# Patient Record
Sex: Male | Born: 1955 | Race: White | Hispanic: No | Marital: Married | Smoking: Never smoker
Health system: Southern US, Community
[De-identification: ages and names within clinical notes are randomized; demographics above are authoritative.]

## PROBLEM LIST (undated history)

## (undated) DIAGNOSIS — E119 Type 2 diabetes mellitus without complications: Secondary | ICD-10-CM

## (undated) DIAGNOSIS — M069 Rheumatoid arthritis, unspecified: Secondary | ICD-10-CM

## (undated) DIAGNOSIS — D649 Anemia, unspecified: Secondary | ICD-10-CM

## (undated) DIAGNOSIS — R011 Cardiac murmur, unspecified: Secondary | ICD-10-CM

## (undated) DIAGNOSIS — M1990 Unspecified osteoarthritis, unspecified site: Secondary | ICD-10-CM

## (undated) DIAGNOSIS — I7300 Raynaud's syndrome without gangrene: Secondary | ICD-10-CM

## (undated) DIAGNOSIS — Z5189 Encounter for other specified aftercare: Secondary | ICD-10-CM

## (undated) DIAGNOSIS — IMO0002 Reserved for concepts with insufficient information to code with codable children: Secondary | ICD-10-CM

## (undated) DIAGNOSIS — F329 Major depressive disorder, single episode, unspecified: Secondary | ICD-10-CM

## (undated) DIAGNOSIS — F32A Depression, unspecified: Secondary | ICD-10-CM

## (undated) DIAGNOSIS — N200 Calculus of kidney: Secondary | ICD-10-CM

## (undated) DIAGNOSIS — K219 Gastro-esophageal reflux disease without esophagitis: Secondary | ICD-10-CM

## (undated) DIAGNOSIS — I10 Essential (primary) hypertension: Secondary | ICD-10-CM

## (undated) DIAGNOSIS — G43709 Chronic migraine without aura, not intractable, without status migrainosus: Secondary | ICD-10-CM

## (undated) DIAGNOSIS — J189 Pneumonia, unspecified organism: Secondary | ICD-10-CM

## (undated) DIAGNOSIS — Z87442 Personal history of urinary calculi: Secondary | ICD-10-CM

## (undated) DIAGNOSIS — T7840XA Allergy, unspecified, initial encounter: Secondary | ICD-10-CM

## (undated) DIAGNOSIS — M549 Dorsalgia, unspecified: Secondary | ICD-10-CM

## (undated) DIAGNOSIS — F419 Anxiety disorder, unspecified: Secondary | ICD-10-CM

## (undated) HISTORY — DX: Allergy, unspecified, initial encounter: T78.40XA

## (undated) HISTORY — DX: Dorsalgia, unspecified: M54.9

## (undated) HISTORY — DX: Calculus of kidney: N20.0

## (undated) HISTORY — DX: Depression, unspecified: F32.A

## (undated) HISTORY — DX: Raynaud's syndrome without gangrene: I73.00

## (undated) HISTORY — PX: COLON SURGERY: SHX602

## (undated) HISTORY — DX: Cardiac murmur, unspecified: R01.1

## (undated) HISTORY — DX: Anxiety disorder, unspecified: F41.9

## (undated) HISTORY — PX: SPINE SURGERY: SHX786

## (undated) HISTORY — DX: Type 2 diabetes mellitus without complications: E11.9

## (undated) HISTORY — DX: Encounter for other specified aftercare: Z51.89

## (undated) HISTORY — DX: Reserved for concepts with insufficient information to code with codable children: IMO0002

## (undated) HISTORY — PX: LYMPH NODE DISSECTION: SHX5087

## (undated) HISTORY — DX: Unspecified osteoarthritis, unspecified site: M19.90

## (undated) HISTORY — DX: Major depressive disorder, single episode, unspecified: F32.9

## (undated) HISTORY — PX: VASECTOMY: SHX75

## (undated) HISTORY — DX: Essential (primary) hypertension: I10

## (undated) HISTORY — DX: Chronic migraine without aura, not intractable, without status migrainosus: G43.709

---

## 1998-06-05 HISTORY — PX: COLON SURGERY: SHX602

## 2000-03-05 ENCOUNTER — Encounter: Admission: RE | Admit: 2000-03-05 | Discharge: 2000-03-05 | Payer: Self-pay | Admitting: Rheumatology

## 2000-03-05 ENCOUNTER — Encounter: Payer: Self-pay | Admitting: Rheumatology

## 2000-06-13 ENCOUNTER — Encounter: Payer: Self-pay | Admitting: Neurology

## 2000-06-13 ENCOUNTER — Ambulatory Visit (HOSPITAL_COMMUNITY): Admission: RE | Admit: 2000-06-13 | Discharge: 2000-06-13 | Payer: Self-pay | Admitting: Neurology

## 2001-06-04 ENCOUNTER — Encounter (INDEPENDENT_AMBULATORY_CARE_PROVIDER_SITE_OTHER): Payer: Self-pay | Admitting: *Deleted

## 2001-06-04 ENCOUNTER — Ambulatory Visit (HOSPITAL_COMMUNITY): Admission: RE | Admit: 2001-06-04 | Discharge: 2001-06-04 | Payer: Self-pay | Admitting: *Deleted

## 2001-07-08 ENCOUNTER — Observation Stay (HOSPITAL_COMMUNITY): Admission: RE | Admit: 2001-07-08 | Discharge: 2001-07-09 | Payer: Self-pay | Admitting: General Surgery

## 2001-07-08 ENCOUNTER — Encounter (INDEPENDENT_AMBULATORY_CARE_PROVIDER_SITE_OTHER): Payer: Self-pay | Admitting: Specialist

## 2003-03-19 ENCOUNTER — Encounter: Admission: RE | Admit: 2003-03-19 | Discharge: 2003-03-19 | Payer: Self-pay | Admitting: Internal Medicine

## 2003-03-19 ENCOUNTER — Encounter: Payer: Self-pay | Admitting: Internal Medicine

## 2008-07-02 ENCOUNTER — Emergency Department (HOSPITAL_COMMUNITY): Admission: EM | Admit: 2008-07-02 | Discharge: 2008-07-02 | Payer: Self-pay | Admitting: Emergency Medicine

## 2009-09-21 LAB — HM COLONOSCOPY: HM Colonoscopy: NORMAL

## 2010-06-15 ENCOUNTER — Ambulatory Visit (HOSPITAL_COMMUNITY)
Admission: RE | Admit: 2010-06-15 | Discharge: 2010-06-15 | Payer: Self-pay | Source: Home / Self Care | Attending: Family Medicine | Admitting: Family Medicine

## 2010-06-28 ENCOUNTER — Encounter
Admission: RE | Admit: 2010-06-28 | Discharge: 2010-06-28 | Payer: Self-pay | Source: Home / Self Care | Attending: Family Medicine | Admitting: Family Medicine

## 2010-09-19 LAB — POCT I-STAT, CHEM 8
BUN: 11 mg/dL (ref 6–23)
Calcium, Ion: 1.15 mmol/L (ref 1.12–1.32)
Chloride: 103 mEq/L (ref 96–112)
Creatinine, Ser: 0.9 mg/dL (ref 0.4–1.5)
Glucose, Bld: 94 mg/dL (ref 70–99)
HCT: 46 % (ref 39.0–52.0)
Hemoglobin: 15.6 g/dL (ref 13.0–17.0)
Potassium: 4.9 mEq/L (ref 3.5–5.1)
Sodium: 139 mEq/L (ref 135–145)
TCO2: 30 mmol/L (ref 0–100)

## 2010-10-10 ENCOUNTER — Other Ambulatory Visit: Payer: Self-pay | Admitting: Family Medicine

## 2010-10-21 NOTE — Procedures (Signed)
[place]. [facility]  Patient:    John Mcclain, John Mcclain Visit Number: [id number] MRN: [id number]          Service Type: END Location: ENDO Attending Physician:  Sabino Gasser Dictated by:   Sabino Gasser, M.D. Admit Date:  06/04/2001   CC:         Lacretia Leigh. Quintella Reichert, M.D.                           Procedure Report  PROCEDURE:  Upper endoscopy.  INDICATION:  Hemoccult positivity, history of GERD.  ANESTHESIA:  Demerol 50 mg, Versed 6 mg.  DESCRIPTION OF PROCEDURE:  With the patient mildly sedated in the left lateral decubitus position, the Olympus videoscopic endoscope was inserted in the mouth, advanced under direct vision to the esophagus.  There was no evidence of Barretts esophagus noted.  This was photographed.  We entered into the stomach, and blood flecks could be seen in streaks in the stomach.  This was photographed, but the mucosa itself, fundus, body, antrum all normal.  There were some areas of mild erythema in the prepyloric area, which were photographed and biopsied.  We entered into the duodenal bulb, second portion of the duodenum both appeared normal.  From this point, the endoscope was withdrawn, taking circumferential views of the duodenal mucosa until the endoscope had been pulled back into the stomach and placed in retroflexion to view the stomach from below.  The endoscope was then straightened and withdrawn, taking circumferential views of the remaining gastric and esophageal mucosa.  The patients vital signs and pulse oximetry remained stable.  The patient tolerated the procedure well without apparent complications.  FINDINGS:  Blood streaking in the stomach, which may, in fact, be the cause of the patients Hemoccult positivity.  Erythema of prepyloric area, await biopsy report, but will still proceed to colonoscopy as planned. Dictated by:   Sabino Gasser, M.D. Attending Physician:  Sabino Gasser DD:  06/04/01 TD:  06/04/01 Job:  [id number]

## 2010-10-21 NOTE — Op Note (Signed)
[facility]  Patient:    John Mcclain, John Mcclain Visit Number: [id number] MRN: [id number]          Service Type: SUR Location: 3W [id number] [id number] Attending Physician:  Brandy Hale Dictated by:   Angelia Mould. Derrell Lolling, M.D. Proc. Date: 07/08/01 Admit Date:  07/08/2001   CC:         Sabino Gasser, M.D.  Lacretia Leigh. Quintella Reichert, M.D.   Operative Report  PREOPERATIVE DIAGNOSIS:  Rectal polyp, recurrent.  POSTOPERATIVE DIAGNOSIS:  Rectal polyp, recurrent.  OPERATION PERFORMED:  Rigid proctoscopy, transanal excision of recurrent rectal polyp.  SURGEON:  Angelia Mould. Derrell Lolling, M.D.  FIRST ASSISTANT:  Sharlet Salina T. Hoxworth, M.D.  OPERATIVE INDICATIONS:  This is a 55 year old white man who underwent transanal excision of a rectal polyp at 10 cm. This was thought to possibly be a benign hamartoma or possibly a "retention type polyp". He had presented with rectal bleeding and that resolved following transanal excision. He has not followed up very much. He recently went for an elective colonoscopy and upper endoscopy because of some Hemoccult positive stools. His upper endoscopy showed a little bit of mild gastritis. His colonoscopy showed a lobulated mass on the anterior wall of the rectum at about 10 cm. This was biopsied and it was read as showing hyperplastic colorectal mucosa, suggestive of rectal prolapse syndrome. No malignancy identified. I have looked at this polyp myself and he has about a 1.5 to 2.0 cm bumpy slightly exophytic mass on the anterior wall of the rectum at 10 cm. He was brought to the operating room for transanal excision under general anesthesia.  OPERATIVE TECHNIQUE:  Following the induction of general endotracheal anesthesia, the patient was placed in a prone jack knife position. Rigid proctoscopy was carried out and confirmed the anatomic findings described above.  The perianal area was prepped and draped in a sterile fashion. The  rectal sphincters were gently dilated over the space of about five minutes until we could insert the larger rectal instruments. We ultimately inserted a lighted bivalve retractor and a small Deaver retractor and we were able to identify the polyp. We placed a suture of 2-0 Vicryl distal to the polyp for traction and the polyp came down to where we could see it better. We used electrocautery to completely excise the polyp with about a 5 mm margin in all directions. We took full thickness into the deep muscle layers all the way around. We did this dissection stepwise from distal to proximal placing interrupted sutures of 2-0 Vicryl to close the mucosa as we went. Ultimately we removed the polyp and the surrounding normal mucosa completely and sent it for histologic evaluation. We completed the closure with interrupted sutures of 2-0 Vicryl. Approximately 10 such sutures of Vicryl had to be placed but this provided complete closure and excellent hemostasis. We injected the rectal mucosa and submucosa with 0.5% Marcaine with epinephrine. We observed this for about 5 minutes and found no bleeding. There was no defect in the mucosa. The instruments were removed. External gauze was placed. The patient tolerated the procedure well and was taken to the recovery room in stable condition. Estimated blood loss was about 30-40 cc. Complications were none. Sponge, needle and instrument counts were correct. Dictated by:   Angelia Mould. Derrell Lolling, M.D. Attending Physician:  Brandy Hale DD:  07/08/01 TD:  07/08/01 Job: [id number]

## 2010-10-21 NOTE — Procedures (Signed)
[place]  Patient:    John, Mcclain Visit Number: [id number] MRN: [id number]          Service Type: END Location: ENDO Attending Physician:  Sabino Gasser Dictated by:   Sabino Gasser, M.D. Proc. Date: 06/04/01 Admit Date:  06/04/2001   CC:         Feliciana Rossetti, M.D.   Procedure Report  PROCEDURE PERFORMED:  Colonoscopy.  ENDOSCOPIST:  Sabino Gasser, M.D.  INDICATIONS FOR PROCEDURE:  Hemoccult positivity.  ANESTHESIA:  Demerol 50 mg, Versed 4 mg.  DESCRIPTION OF PROCEDURE:  With the patient mildly sedated in the left lateral decubitus position, the Olympus videoscopic colonoscope was inserted in the rectum and passed under direct vision into the cecum identified by the ileocecal valve and appendiceal orifice, both of which were photographed. From this point, the colonoscope was slowly withdrawn, taking circumferential views of the entire colonic mucosa stopping in the rectum at approximately 10 cm from the anal verge at which point a polypoid mass was seen and photographed.  Whether this was benign or malignant is questionable at this point.  It was fairly large, a few centimeters and multilobulated and therefore biopsies only were taken.  The endoscope was then placed on retroflexion to view the anal canal from above and this showed some hemorrhoids but otherwise unremarkable and was photographed.  The endoscope was straightened and withdrawn.  Patients vital signs and pulse oximeter remained stable.  The patient tolerated the procedure well and without apparent complications.  FINDINGS:  Lobulated mass/polyp in the rectal area.  PLAN:  Will need to await biopsy report to determine future course of action whether further polypectomy or surgery will be necessary but certainly this will need to be removed in one fashion or another.  Will await biopsy report. Will ask the patient to call me in a few days for results and follow up with me  as an outpatient. Dictated by:   Sabino Gasser, M.D. Attending Physician:  Sabino Gasser DD:  06/04/01 TD:  06/04/01 Job: [id number]

## 2011-06-12 ENCOUNTER — Ambulatory Visit (INDEPENDENT_AMBULATORY_CARE_PROVIDER_SITE_OTHER): Payer: PRIVATE HEALTH INSURANCE | Admitting: Family Medicine

## 2011-06-12 DIAGNOSIS — R7301 Impaired fasting glucose: Secondary | ICD-10-CM

## 2011-06-12 DIAGNOSIS — F411 Generalized anxiety disorder: Secondary | ICD-10-CM

## 2011-07-25 ENCOUNTER — Other Ambulatory Visit: Payer: Self-pay | Admitting: *Deleted

## 2011-07-25 MED ORDER — METFORMIN HCL ER 500 MG PO TB24: 500.0000 mg | ORAL_TABLET | ORAL | Status: DC

## 2011-09-18 ENCOUNTER — Ambulatory Visit: Payer: PRIVATE HEALTH INSURANCE | Admitting: Family Medicine

## 2011-09-25 ENCOUNTER — Ambulatory Visit (INDEPENDENT_AMBULATORY_CARE_PROVIDER_SITE_OTHER): Payer: PRIVATE HEALTH INSURANCE | Admitting: Family Medicine

## 2011-09-25 VITALS — BP 110/71 | HR 82 | Temp 97.0°F | Resp 16 | Ht 65.0 in | Wt 146.0 lb

## 2011-09-25 DIAGNOSIS — F419 Anxiety disorder, unspecified: Secondary | ICD-10-CM

## 2011-09-25 DIAGNOSIS — I10 Essential (primary) hypertension: Secondary | ICD-10-CM

## 2011-09-25 DIAGNOSIS — M069 Rheumatoid arthritis, unspecified: Secondary | ICD-10-CM

## 2011-09-25 DIAGNOSIS — K219 Gastro-esophageal reflux disease without esophagitis: Secondary | ICD-10-CM

## 2011-09-25 DIAGNOSIS — N200 Calculus of kidney: Secondary | ICD-10-CM

## 2011-09-25 DIAGNOSIS — E119 Type 2 diabetes mellitus without complications: Secondary | ICD-10-CM

## 2011-09-25 DIAGNOSIS — E291 Testicular hypofunction: Secondary | ICD-10-CM

## 2011-09-25 MED ORDER — METFORMIN HCL ER 500 MG PO TB24: ORAL_TABLET | ORAL | Status: DC

## 2011-09-25 MED ORDER — LISINOPRIL 10 MG PO TABS: 10.0000 mg | ORAL_TABLET | Freq: Every day | ORAL | Status: DC

## 2011-09-25 NOTE — Progress Notes (Signed)
  Subjective:    Patient ID: John Mcclain, male    DOB: [birth year], 56 y.o.   MRN: [id number]  HPI Patient presents for routine follow up  Migraine headaches- Receiving Botox injections from Dr. Neale Burly  RA- Stable; care per Dr. Kellie Simmering.  Methotrexate, sulfasalazine and plaquenil.  Prn prednisone  DM- patient provides BS readings.  Spikes when on prednisone.   Review of Systems     Objective:   Physical Exam  Constitutional: He appears well-developed.  HENT:  Head: Normocephalic.  Right Ear: External ear normal.  Left Ear: External ear normal.  Nose: Nose normal.  Mouth/Throat: Oropharynx is clear and moist.  Neck: Neck supple. No thyromegaly present.  Cardiovascular: Normal rate, regular rhythm and normal heart sounds.   Pulmonary/Chest: Effort normal and breath sounds normal.  Abdominal: Soft. Bowel sounds are normal.  Neurological: He is alert.  Skin: Skin is warm.  Psychiatric: He has a normal mood and affect.          Assessment & Plan:   1. DM (diabetes mellitus)  metFORMIN (GLUCOPHAGE XR) 500 MG 24 hr tablet, POCT glycosylated hemoglobin (Hb A1C), Testosterone  2. RA (rheumatoid arthritis)  POCT glycosylated hemoglobin (Hb A1C), Testosterone  3. Migraine    4. HTN (hypertension)  lisinopril (PRINIVIL,ZESTRIL) 10 MG tablet  5. Anxiety    6. Hypogonadism male  POCT glycosylated hemoglobin (Hb A1C), Testosterone  7. Nephrolithiasis  POCT glycosylated hemoglobin (Hb A1C), Testosterone  8. GERD (gastroesophageal reflux disease)

## 2011-10-12 ENCOUNTER — Ambulatory Visit: Payer: PRIVATE HEALTH INSURANCE

## 2011-10-12 ENCOUNTER — Ambulatory Visit (INDEPENDENT_AMBULATORY_CARE_PROVIDER_SITE_OTHER): Payer: PRIVATE HEALTH INSURANCE | Admitting: Family Medicine

## 2011-10-12 DIAGNOSIS — R109 Unspecified abdominal pain: Secondary | ICD-10-CM

## 2011-10-12 LAB — POCT URINALYSIS DIPSTICK
Bilirubin, UA: NEGATIVE
Glucose, UA: NEGATIVE
Ketones, UA: NEGATIVE
Leukocytes, UA: NEGATIVE
Nitrite, UA: NEGATIVE
Protein, UA: NEGATIVE
Spec Grav, UA: 1.015
Urobilinogen, UA: 0.2
pH, UA: 6

## 2011-10-12 LAB — POCT UA - MICROSCOPIC ONLY
Bacteria, U Microscopic: NEGATIVE
Casts, Ur, LPF, POC: NEGATIVE
Crystals, Ur, HPF, POC: NEGATIVE
Epithelial cells, urine per micros: NEGATIVE
Mucus, UA: NEGATIVE
Yeast, UA: NEGATIVE

## 2011-10-12 MED ORDER — TAMSULOSIN HCL 0.4 MG PO CAPS: 0.4000 mg | ORAL_CAPSULE | Freq: Every day | ORAL | Status: DC

## 2011-10-12 MED ORDER — TRAMADOL HCL 50 MG PO TABS: 50.0000 mg | ORAL_TABLET | Freq: Three times a day (TID) | ORAL | Status: DC | PRN

## 2011-10-12 MED ORDER — TRAMADOL HCL 50 MG PO TABS: 50.0000 mg | ORAL_TABLET | Freq: Three times a day (TID) | ORAL | Status: AC | PRN

## 2011-10-12 MED ORDER — POLYETHYLENE GLYCOL 3350 17 GM/SCOOP PO POWD: ORAL | Status: DC

## 2011-10-12 NOTE — Progress Notes (Signed)
  Subjective:    Patient ID: John Mcclain, male    DOB: [birth year], 56 y.o.   MRN: [id number]  HPI Patient presents with 2 day history of (R) flank pain that has moved to (R) lower abdomen. Feels bloated Slight dysuria Took Flomax and hydrocodone over night  Last BM yesterday; soft stool   Review of Systems  Constitutional: Negative for chills. Fever: tactile fever.   PMH/ RA; immunosuppressed on methotrexate           H/O nephrolithiasis Objective:   Physical Exam  Constitutional: He appears well-developed.  HENT:  Mouth/Throat: Oropharynx is clear and moist.  Neck: Neck supple.  Cardiovascular: Normal rate, regular rhythm and normal heart sounds.   Pulmonary/Chest: Effort normal and breath sounds normal.  Abdominal: Soft. He exhibits no mass. There is no tenderness. There is no guarding.       doughy  Neurological: He is alert.  Skin: Skin is warm.  Psychiatric: He has a normal mood and affect.     Results for orders placed in visit on 10/12/11  POCT UA - MICROSCOPIC ONLY      Component Value Range   WBC, Ur, HPF, POC 0-1     RBC, urine, microscopic 0-1     Bacteria, U Microscopic neg     Mucus, UA neg     Epithelial cells, urine per micros neg     Crystals, Ur, HPF, POC neg     Casts, Ur, LPF, POC neg     Yeast, UA neg    POCT URINALYSIS DIPSTICK      Component Value Range   Color, UA yellow     Clarity, UA clear     Glucose, UA neg     Bilirubin, UA neg     Ketones, UA neg     Spec Grav, UA 1.015     Blood, UA trace     pH, UA 6.0     Protein, UA neg     Urobilinogen, UA 0.2     Nitrite, UA neg     Leukocytes, UA Negative      [facility] reading (PRIMARY) by  Dr. Hal Hope Increase stool gas pattern    Assessment & Plan:   1. Abdominal pain; increase stool pattern on x ray; normal U/A POCT UA - Microscopic Only, POCT urinalysis dipstick, DG Abd 1 View, polyethylene glycol powder (GLYCOLAX/MIRALAX) powder, traMADol (ULTRAM) 50 MG tablet, Tamsulosin HCl  (FLOMAX) 0.4 MG CAPS, close follow up

## 2011-10-16 ENCOUNTER — Other Ambulatory Visit: Payer: Self-pay | Admitting: Family Medicine

## 2011-10-16 DIAGNOSIS — E291 Testicular hypofunction: Secondary | ICD-10-CM

## 2011-10-16 DIAGNOSIS — E119 Type 2 diabetes mellitus without complications: Secondary | ICD-10-CM

## 2011-10-17 LAB — TESTOSTERONE, FREE, TOTAL, SHBG
Sex Hormone Binding: 49 nmol/L (ref 13–71)
Testosterone, Free: 37.2 pg/mL — ABNORMAL LOW (ref 47.0–244.0)
Testosterone-% Free: 1.5 % — ABNORMAL LOW (ref 1.6–2.9)
Testosterone: 249.04 ng/dL — ABNORMAL LOW (ref 300–890)

## 2011-10-17 LAB — HEMOGLOBIN A1C
Hgb A1c MFr Bld: 4.5 % (ref <5.7)
Mean Plasma Glucose: 82 mg/dL (ref <117)

## 2011-10-28 ENCOUNTER — Other Ambulatory Visit: Payer: Self-pay | Admitting: Family Medicine

## 2011-10-28 ENCOUNTER — Telehealth: Payer: Self-pay | Admitting: Family Medicine

## 2011-10-28 DIAGNOSIS — E291 Testicular hypofunction: Secondary | ICD-10-CM

## 2011-10-28 MED ORDER — TESTOSTERONE 20.25 MG/ACT (1.62%) TD GEL: 2.0000 | Freq: Every day | TRANSDERMAL | Status: DC

## 2011-11-07 ENCOUNTER — Encounter: Payer: Self-pay | Admitting: Family Medicine

## 2011-11-11 NOTE — Telephone Encounter (Signed)
LM to review BW with patient 10/28/11.

## 2011-11-17 ENCOUNTER — Other Ambulatory Visit: Payer: Self-pay | Admitting: Rheumatology

## 2011-11-17 DIAGNOSIS — M542 Cervicalgia: Secondary | ICD-10-CM

## 2011-11-21 ENCOUNTER — Ambulatory Visit
Admission: RE | Admit: 2011-11-21 | Discharge: 2011-11-21 | Disposition: A | Discharge status: Home / Self Care | Payer: PRIVATE HEALTH INSURANCE | Source: Ambulatory Visit | Attending: Rheumatology | Admitting: Rheumatology

## 2011-11-21 DIAGNOSIS — M542 Cervicalgia: Secondary | ICD-10-CM

## 2011-12-26 ENCOUNTER — Ambulatory Visit (INDEPENDENT_AMBULATORY_CARE_PROVIDER_SITE_OTHER): Payer: PRIVATE HEALTH INSURANCE | Admitting: Family Medicine

## 2011-12-26 ENCOUNTER — Encounter: Payer: Self-pay | Admitting: Family Medicine

## 2011-12-26 VITALS — BP 117/72 | HR 103 | Temp 98.1°F | Resp 16 | Ht 65.0 in | Wt 143.0 lb

## 2011-12-26 DIAGNOSIS — M25519 Pain in unspecified shoulder: Secondary | ICD-10-CM

## 2011-12-26 DIAGNOSIS — G43909 Migraine, unspecified, not intractable, without status migrainosus: Secondary | ICD-10-CM | POA: Insufficient documentation

## 2011-12-26 DIAGNOSIS — E559 Vitamin D deficiency, unspecified: Secondary | ICD-10-CM | POA: Insufficient documentation

## 2011-12-26 DIAGNOSIS — E119 Type 2 diabetes mellitus without complications: Secondary | ICD-10-CM

## 2011-12-26 DIAGNOSIS — F419 Anxiety disorder, unspecified: Secondary | ICD-10-CM | POA: Insufficient documentation

## 2011-12-26 DIAGNOSIS — M25512 Pain in left shoulder: Secondary | ICD-10-CM

## 2011-12-26 DIAGNOSIS — E291 Testicular hypofunction: Secondary | ICD-10-CM | POA: Insufficient documentation

## 2011-12-26 DIAGNOSIS — E1159 Type 2 diabetes mellitus with other circulatory complications: Secondary | ICD-10-CM | POA: Insufficient documentation

## 2011-12-26 DIAGNOSIS — I10 Essential (primary) hypertension: Secondary | ICD-10-CM | POA: Insufficient documentation

## 2011-12-26 DIAGNOSIS — M069 Rheumatoid arthritis, unspecified: Secondary | ICD-10-CM | POA: Insufficient documentation

## 2011-12-26 DIAGNOSIS — N200 Calculus of kidney: Secondary | ICD-10-CM | POA: Insufficient documentation

## 2011-12-26 DIAGNOSIS — K2980 Duodenitis without bleeding: Secondary | ICD-10-CM | POA: Insufficient documentation

## 2011-12-26 LAB — POCT GLYCOSYLATED HEMOGLOBIN (HGB A1C): Hemoglobin A1C: 4.3

## 2011-12-26 MED ORDER — LORAZEPAM 1 MG PO TABS: 1.0000 mg | ORAL_TABLET | Freq: Three times a day (TID) | ORAL | Status: DC

## 2011-12-26 NOTE — Patient Instructions (Addendum)
Diabetes Meal Planning Guide The diabetes meal planning guide is a tool to help you plan your meals and snacks. It is important for people with diabetes to manage their blood glucose (sugar) levels. Choosing the right foods and the right amounts throughout your day will help control your blood glucose. Eating right can even help you improve your blood pressure and reach or maintain a healthy weight. CARBOHYDRATE COUNTING MADE EASY When you eat carbohydrates, they turn to sugar. This raises your blood glucose level. Counting carbohydrates can help you control this level so you feel better. When you plan your meals by counting carbohydrates, you can have more flexibility in what you eat and balance your medicine with your food intake. Carbohydrate counting simply means adding up the total amount of carbohydrate grams in your meals and snacks. Try to eat about the same amount at each meal. Foods with carbohydrates are listed below. Each portion below is 1 carbohydrate serving or 15 grams of carbohydrates. Ask your dietician how many grams of carbohydrates you should eat at each meal or snack. Grains and Starches  1 slice bread.    English muffin or hotdog/hamburger bun.    cup cold cereal (unsweetened).   ? cup cooked pasta or rice.    cup starchy vegetables (corn, potatoes, peas, beans, winter squash).   1 tortilla (6 inches).    bagel.   1 waffle or pancake (size of a CD).    cup cooked cereal.   4 to 6 small crackers.  *Whole grain is recommended. Fruit  1 cup fresh unsweetened berries, melon, papaya, pineapple.   1 small fresh fruit.    banana or mango.    cup fruit juice (4 oz unsweetened).    cup canned fruit in natural juice or water.   2 tbs dried fruit.   12 to 15 grapes or cherries.  Milk and Yogurt  1 cup fat-free or 1% milk.   1 cup soy milk.   6 oz light yogurt with sugar-free sweetener.   6 oz low-fat soy yogurt.   6 oz plain yogurt.   Vegetables  1 cup raw or  cup cooked is counted as 0 carbohydrates or a "free" food.   If you eat 3 or more servings at 1 meal, count them as 1 carbohydrate serving.  Other Carbohydrates   oz chips or pretzels.    cup ice cream or frozen yogurt.    cup sherbet or sorbet.   2 inch square cake, no frosting.   1 tbs honey, sugar, jam, jelly, or syrup.   2 small cookies.   3 squares of graham crackers.   3 cups popcorn.   6 crackers.   1 cup broth-based soup.   Count 1 cup casserole or other mixed foods as 2 carbohydrate servings.   Foods with less than 20 calories in a serving may be counted as 0 carbohydrates or a "free" food.  You may want to purchase a book or computer software that lists the carbohydrate gram counts of different foods. In addition, the nutrition facts panel on the labels of the foods you eat are a good source of this information. The label will tell you how big the serving size is and the total number of carbohydrate grams you will be eating per serving. Divide this number by 15 to obtain the number of carbohydrate servings in a portion. Remember, 1 carbohydrate serving equals 15 grams of carbohydrate. SERVING SIZES Measuring foods and serving sizes helps you   make sure you are getting the right amount of food. The list below tells how big or small some common serving sizes are.  1 oz.........4 stacked dice.   3 oz........Marland KitchenDeck of cards.   1 tsp.......Marland KitchenTip of little finger.   1 tbs......Marland KitchenMarland KitchenThumb.   2 tbs.......Marland KitchenGolf ball.    cup......Marland KitchenHalf of a fist.   1 cup.......Marland KitchenA fist.  SAMPLE DIABETES MEAL PLAN Below is a sample meal plan that includes foods from the grain and starches, dairy, vegetable, fruit, and meat groups. A dietician can individualize a meal plan to fit your calorie needs and tell you the number of servings needed from each food group. However, controlling the total amount of carbohydrates in your meal or snack is more important than  making sure you include all of the food groups at every meal. You may interchange carbohydrate containing foods (dairy, starches, and fruits). The meal plan below is an example of a 2000 calorie diet using carbohydrate counting. This meal plan has 17 carbohydrate servings. Breakfast  1 cup oatmeal (2 carb servings).    cup light yogurt (1 carb serving).   1 cup blueberries (1 carb serving).    cup almonds.  Snack  1 large apple (2 carb servings).   1 low-fat string cheese stick.  Lunch  Chicken breast salad.   1 cup spinach.    cup chopped tomatoes.   2 oz chicken breast, sliced.   2 tbs low-fat [place] dressing.   12 whole-wheat crackers (2 carb servings).   12 to 15 grapes (1 carb serving).   1 cup low-fat milk (1 carb serving).  Snack  1 cup carrots.    cup hummus (1 carb serving).  Dinner  3 oz broiled salmon.   1 cup brown rice (3 carb servings).  Snack  1  cups steamed broccoli (1 carb serving) drizzled with 1 tsp olive oil and lemon juice.   1 cup light pudding (2 carb servings).  DIABETES MEAL PLANNING WORKSHEET Your dietician can use this worksheet to help you decide how many servings of foods and what types of foods are right for you.  BREAKFAST Food Group and Servings / Carb Servings Grain/Starches __________________________________ Dairy __________________________________________ Vegetable ______________________________________ Fruit ___________________________________________ Meat __________________________________________ Fat ____________________________________________ LUNCH Food Group and Servings / Carb Servings Grain/Starches ___________________________________ Dairy ___________________________________________ Fruit ____________________________________________ Meat ___________________________________________ Fat _____________________________________________ Laural Golden Food Group and Servings / Carb Servings Grain/Starches  ___________________________________ Dairy ___________________________________________ Fruit ____________________________________________ Meat ___________________________________________ Fat _____________________________________________ SNACKS Food Group and Servings / Carb Servings Grain/Starches ___________________________________ Dairy ___________________________________________ Vegetable _______________________________________ Fruit ____________________________________________ Meat ___________________________________________ Fat _____________________________________________ DAILY TOTALS Starches _________________________ Vegetable ________________________ Fruit ____________________________ Dairy ____________________________ Meat ____________________________ Fat ______________________________ Document Released: 02/16/2005 Document Revised: 05/11/2011 Document Reviewed: 12/28/2008 [employer] Patient Information 2012 [place], [place].  RE: Metformin- reduce your dose to 1 tablet daily with largest meal. You may be able to discontinue this medication completely if you follow the Diabetic Meal Plan outlined above.     RE: Left shoulder region pain and winged scapula- you may need to have an MRI of Left shoulder region done to clarify what is the source of your problem     Meralgia Paresthetica  Meralgia paresthetica (MP) is a disorder characterized by tingling, numbness, and burning pain in the outer side of the thigh. It occurs in men more than women. MP is generally found in middle-aged or overweight people. Sometimes, the disorder may disappear. CAUSES The disorder is caused by a nerve in the thigh being squeezed (compressed). MP may be associated  with tight clothing, pregnancy, diabetes, and being overweight (obese). SYMPTOMS  Tingling, numbness, and burning in the outer thigh.   An area of the skin may be painful and sensitive to the touch.  The symptoms often worsen after  walking or standing. TREATMENT  Treatment is based on your symptoms and is mainly supportive. Treatment may include:  Wearing looser clothing.   Losing weight.   Avoiding prolonged standing or walking.   Taking medication.   Surgery if the pain is peristent or severe.  MP usually eases or disappears after treatment. Surgery is not always fully successful. Document Released: 05/12/2002 Document Revised: 05/11/2011 Document Reviewed: 05/22/2005 [facility] Patient Information 2012 [place], [place].

## 2011-12-26 NOTE — Progress Notes (Signed)
Subjective:    Patient ID: John Mcclain, male    DOB: 09-24-1955, 56 y.o.   MRN: [id number]  HPI   This 56 y.o. Cauc male has Glucose intolerance treated with Metformin 500 mg bid; last A1c= 4.5%  FSBS have been in good range but he recently finished Prednisone prescribed by Dr. Kellie Simmering for pain  associated with C-spine DDD. He does not follow a meal plan and admits drinking sodas (has cut back).  Log of FSBS: fasting= 90-163.   He c/o numbness involving the distal lateral thigh of right leg. No known recent trauma. No discoloration or  swelling of skin in this area. No problem with knee joint.   He has left shoulder region pain and what looks like a winged scapula; I have discussed this with him  and he has ongoing evaluation by Dr. Kellie Simmering. He says that Ortho referral has been mentioned  (he is seeing Dr. Kellie Simmering this afternoon).   Tremor in upper ext- somewhat improved with Lorazepam. Is taking a Calcium and Vit D supplement.   HTN- well controlled but has some lightheadedness when working out in yard. He maintains adequate hydration.   Low testosterone- le is using Androgel and was advised to have level checked after being on medication  for 12 weeks; he has only been using med 8 weeks (he is advised to get testosterone level checked in ~ 4 weeks).  Review of Systems  Constitutional: Negative.   HENT: Positive for neck pain and neck stiffness.   Eyes: Negative for visual disturbance.  Respiratory: Negative for chest tightness.   Cardiovascular: Negative for chest pain and palpitations.  Genitourinary: Negative for urgency, frequency, hematuria, flank pain and testicular pain.        Passed kidney stone ~ 2 months ago  Neurological: Negative.        Objective:   Physical Exam  Nursing note and vitals reviewed. Constitutional: He is oriented to person, place, and time. He appears well-developed and well-nourished. No distress.  HENT:  Head: Normocephalic and atraumatic.    Eyes: Conjunctivae and EOM are normal. No scleral icterus.  Neck: No thyromegaly present.       Tender posterior neck muscles with spasm  Cardiovascular: Normal rate, regular rhythm and normal heart sounds.   Pulmonary/Chest: Effort normal and breath sounds normal. No respiratory distress.  Musculoskeletal:       Left scapula: winged with abduction of left arm and abnormal position with arm at left side  Lymphadenopathy:    He has no cervical adenopathy.  Neurological: He is alert and oriented to person, place, and time. He has normal reflexes. No cranial nerve deficit. Coordination normal.       Sensation on right distal lateral thigh: hypoesthesia/ abnormal light touch sensation  Skin: Skin is warm and dry. No erythema.       Right leg: distal lateral thigh has very slight discoloration; no swelling or rash or vesicular lesions  Psychiatric: He has a normal mood and affect. His behavior is normal.   Results for orders placed in visit on 12/26/11  POCT GLYCOSYLATED HEMOGLOBIN (HGB A1C)      Component Value Range   Hemoglobin A1C 4.3            Assessment & Plan:   1. Type II or unspecified type diabetes mellitus without mention of complication, not stated as uncontrolled  Anti-Inflammatory Diet info given to pt. Metformin: reduce dose to 1 tab daily with largest meal Follow Diabetic meal  plan provided today  2. Hypogonadism male  Lipid panel, PSA RTC for Testosterone level in ~ 4 weeks  3. Vitamin d deficiency  Vitamin D, 25-hydroxy Continue OTC supplement  4. Pain, joint, shoulder region, left  Discuss possibility of MRI with Dr. Kellie Simmering  5. HTN (hypertension), benign  Continue current tmedications and modify diet   Pt given info re: Meralgia paresthetica- this is the reason that he has this abnormal sensation along lateral  aspect of distal right thigh.

## 2011-12-27 LAB — LIPID PANEL
Cholesterol: 181 mg/dL (ref 0–200)
HDL: 52 mg/dL (ref >39)
LDL Cholesterol: 103 mg/dL — ABNORMAL HIGH (ref 0–99)
Total CHOL/HDL Ratio: 3.5 Ratio
Triglycerides: 131 mg/dL (ref <150)
VLDL: 26 mg/dL (ref 0–40)

## 2011-12-27 LAB — PSA: PSA: 0.83 ng/mL (ref <=4.00)

## 2011-12-27 LAB — VITAMIN D 25 HYDROXY (VIT D DEFICIENCY, FRACTURES): Vit D, 25-Hydroxy: 32 ng/mL (ref 30–89)

## 2011-12-27 NOTE — Progress Notes (Signed)
Quick Note:  Please call pt and advise that the following labs are abnormal... Vitamin D is low-normal range; get over-the-counter Vitamin D3 2000 IU and take 1 capsule daily. Try to get 10-15 minutes of sun exposure most days of the week.  Prostate blood test is normal. Lipid panel looks good.  Copy to pt.   ______

## 2012-03-13 ENCOUNTER — Other Ambulatory Visit: Payer: Self-pay | Admitting: Family Medicine

## 2012-03-14 ENCOUNTER — Other Ambulatory Visit: Payer: Self-pay | Admitting: *Deleted

## 2012-03-27 ENCOUNTER — Ambulatory Visit: Payer: PRIVATE HEALTH INSURANCE | Admitting: Family Medicine

## 2012-04-02 ENCOUNTER — Encounter: Payer: Self-pay | Admitting: Family Medicine

## 2012-04-02 ENCOUNTER — Ambulatory Visit (INDEPENDENT_AMBULATORY_CARE_PROVIDER_SITE_OTHER): Payer: PRIVATE HEALTH INSURANCE | Admitting: Family Medicine

## 2012-04-02 VITALS — BP 134/85 | HR 89 | Temp 97.7°F | Resp 16 | Ht 65.5 in | Wt 143.0 lb

## 2012-04-02 DIAGNOSIS — F32A Depression, unspecified: Secondary | ICD-10-CM

## 2012-04-02 DIAGNOSIS — E119 Type 2 diabetes mellitus without complications: Secondary | ICD-10-CM

## 2012-04-02 DIAGNOSIS — R202 Paresthesia of skin: Secondary | ICD-10-CM

## 2012-04-02 DIAGNOSIS — F329 Major depressive disorder, single episode, unspecified: Secondary | ICD-10-CM

## 2012-04-02 DIAGNOSIS — I10 Essential (primary) hypertension: Secondary | ICD-10-CM

## 2012-04-02 DIAGNOSIS — E291 Testicular hypofunction: Secondary | ICD-10-CM

## 2012-04-02 DIAGNOSIS — R42 Dizziness and giddiness: Secondary | ICD-10-CM

## 2012-04-02 DIAGNOSIS — R209 Unspecified disturbances of skin sensation: Secondary | ICD-10-CM

## 2012-04-02 LAB — POCT GLYCOSYLATED HEMOGLOBIN (HGB A1C): Hemoglobin A1C: 4.5

## 2012-04-02 MED ORDER — TESTOSTERONE 20.25 MG/ACT (1.62%) TD GEL: 2.0000 | Freq: Every day | TRANSDERMAL | Status: DC

## 2012-04-02 MED ORDER — LISINOPRIL 10 MG PO TABS: 10.0000 mg | ORAL_TABLET | Freq: Every day | ORAL | Status: DC

## 2012-04-02 MED ORDER — LORAZEPAM 1 MG PO TABS: ORAL_TABLET | ORAL | Status: DC

## 2012-04-02 NOTE — Patient Instructions (Signed)
Metformin dose is decreased to 1/2 tablet with evening meal.  We have discussed possibly treating your depression with medication (Wellbutrin).

## 2012-04-02 NOTE — Progress Notes (Signed)
S:  This 56 y.o. Cauc male has Type II DM which is extremely well controlled on Metformin XR 500 mg 1 tab daily. He has FSBS log showing range 80- 138 (highest value= 159). He c/o jittery sensation with "flush" occurring after meals  But also at other times, without provocation. He has checked his FSBS and BP when these episodes occur and "everything is normal". This is almost daily and started < 1 year ago. Lorazepam  (1 mg)  1/2 tablet provides some relief and pt takes another 1/2 tablet if symptoms persists after 1 hour.  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  He still has ED (Testosterone level is in normal range) and difficulty maintaining an erection. Also having crying spells and feels "down". He feels like a lot of these feelings are related to so many medical issues and no good answer for these episodes of dizziness and jitteriness.  ROS: As per HPI.   O: Filed Vitals:   04/02/12 1309  BP: 134/85  Pulse: 89  Temp: 97.7 F (36.5 C)  Resp: 16   GEN: In NAD; WN,WD. HEENT: [place]/AT; EOMI with clear conj/sclerae. EAC/TMs normal. Oroph benign. NECK: No carotid bruits. COR: RRR. LUNGS: CTA; normal resp rate and effort. NEURO: A&O x 3; CNs intact. Nonfocal.   BECK'S Depression Scale: score= 17. (He does have some thoughts of killing self but would not carry them out; anhedonia; emotionally labile, anxious about health, fatigued and low libido).    Results for orders placed in visit on 04/02/12  POCT GLYCOSYLATED HEMOGLOBIN (HGB A1C)      Component Value Range   Hemoglobin A1C 4.5         A/P: 1. Dizziness - possible hypoglycemia   2. Paresthesia    3. HTN (hypertension)  lisinopril (PRINIVIL,ZESTRIL) 10 MG tablet  4. Type II or unspecified type diabetes mellitus without mention of complication, not stated as uncontrolled  POCT glycosylated hemoglobin (Hb  A1C) Reduce Metformin to 1/2 tablet daily   5. Depression  Consider treatment with Wellbutrin (pt is agreeable to this). Will discuss further at next visit.  6. Hypogonadism male  Testosterone 20.25 MG/ACT (1.62%) GEL   Pt had labs drawn in August by Dr. Kellie Simmering; results have been scanned into EMR.

## 2012-04-15 ENCOUNTER — Encounter: Payer: Self-pay | Admitting: Family Medicine

## 2012-04-30 ENCOUNTER — Ambulatory Visit (INDEPENDENT_AMBULATORY_CARE_PROVIDER_SITE_OTHER): Payer: PRIVATE HEALTH INSURANCE | Admitting: Family Medicine

## 2012-04-30 ENCOUNTER — Encounter: Payer: Self-pay | Admitting: Family Medicine

## 2012-04-30 VITALS — BP 128/84 | HR 85 | Temp 97.9°F | Resp 18 | Ht 65.0 in | Wt 147.0 lb

## 2012-04-30 DIAGNOSIS — M5430 Sciatica, unspecified side: Secondary | ICD-10-CM

## 2012-04-30 DIAGNOSIS — I10 Essential (primary) hypertension: Secondary | ICD-10-CM

## 2012-04-30 DIAGNOSIS — M5431 Sciatica, right side: Secondary | ICD-10-CM

## 2012-04-30 DIAGNOSIS — R42 Dizziness and giddiness: Secondary | ICD-10-CM

## 2012-04-30 DIAGNOSIS — M5030 Other cervical disc degeneration, unspecified cervical region: Secondary | ICD-10-CM

## 2012-04-30 LAB — T4, FREE: Free T4: 1 ng/dL (ref 0.80–1.80)

## 2012-04-30 LAB — BASIC METABOLIC PANEL
BUN: 10 mg/dL (ref 6–23)
CO2: 28 mEq/L (ref 19–32)
Calcium: 9.6 mg/dL (ref 8.4–10.5)
Chloride: 106 mEq/L (ref 96–112)
Creat: 0.98 mg/dL (ref 0.50–1.35)
Glucose, Bld: 80 mg/dL (ref 70–99)
Potassium: 4.2 mEq/L (ref 3.5–5.3)
Sodium: 139 mEq/L (ref 135–145)

## 2012-04-30 LAB — TSH: TSH: 1.595 u[IU]/mL (ref 0.350–4.500)

## 2012-04-30 NOTE — Progress Notes (Signed)
S:  Pt is here to follow-up on eval for dizziness; he continues to experience intermittent episodes after meals. He checks his BS when this occurs; values are in the normal range. He denies sinus pain or pressure but does have tinnitus. This has been discussed with his neurologist who does not think this complaint is a neurologic problem. The pt has chronic migraine disorder for which he is receiving Botox injections and takes Gabapentin 1200 mg/day. He also has C-spine DDD (multi-    level) and is not considering surgery at this time. He denies palpitations but states he has an occasional "flippy feeling" in his chest; it is a fleeting sensation.   Pt also has c/o right posterior hip discomfort, onset after doing some chores around the house this past week. It has improved with rest.  ROS; As per HPI; negative for fever/ chills, appetite change, loss of hearing or difficulty swallowing, CP or tightness, SOB or DOE, tremor, weakness or syncope.  O:  Filed Vitals:   04/30/12 1318  BP: 128/84  Pulse: 85  Temp: 97.9 F (36.6 C)  Resp: 18   GEN: In NAD; WN,WD. HEENT: [place]/AT; EOMI w/ clear  Conj/scl. Otherwise normal. NECK: Decreased ROM. No carotid bruits. No JVD. COR: RRR; no m/g/r appreciated. LUNGS: Normal resp rate and effort. NEURO: A&O x 3; CNs intact. Nonfocal.  A/P: 1. Dizziness  TSH, T4, Free, US Carotid Duplex Bilateral Pt may need MRI w/ contrast to determine if C-spine DDD is impacting posterior cerebral blood flow.  2. HTN (hypertension)  Basic metabolic panel  3. DDD (degenerative disc disease), cervical  Reviewed MRI (June 2013) of C-spine which revealed multi-level mild to moderately severe spondylosis with evidence of some cord compression esp. On left  4. Sciatica of right side  Conservative treatment recommended.

## 2012-04-30 NOTE — Patient Instructions (Addendum)
Sciatica Sciatica is pain, weakness, numbness, or tingling along the path of the sciatic nerve. The nerve starts in the lower back and runs down the back of each leg. The nerve controls the muscles in the lower leg and in the back of the knee, while also providing sensation to the back of the thigh, lower leg, and the sole of your foot. Sciatica is a symptom of another medical condition. For instance, nerve damage or certain conditions, such as a herniated disk or bone spur on the spine, pinch or put pressure on the sciatic nerve. This causes the pain, weakness, or other sensations normally associated with sciatica. Generally, sciatica only affects one side of the body. CAUSES   Herniated or slipped disc.  Degenerative disk disease.  A pain disorder involving the narrow muscle in the buttocks (piriformis syndrome).  Pelvic injury or fracture.  Pregnancy.  Tumor (rare). SYMPTOMS  Symptoms can vary from mild to very severe. The symptoms usually travel from the low back to the buttocks and down the back of the leg. Symptoms can include:  Mild tingling or dull aches in the lower back, leg, or hip.  Numbness in the back of the calf or sole of the foot.  Burning sensations in the lower back, leg, or hip.  Sharp pains in the lower back, leg, or hip.  Leg weakness.  Severe back pain inhibiting movement. These symptoms may get worse with coughing, sneezing, laughing, or prolonged sitting or standing. Also, being overweight may worsen symptoms. DIAGNOSIS  Your caregiver will perform a physical exam to look for common symptoms of sciatica. He or she may ask you to do certain movements or activities that would trigger sciatic nerve pain. Other tests may be performed to find the cause of the sciatica. These may include:  Blood tests.  X-rays.  Imaging tests, such as an MRI or CT scan. TREATMENT  Treatment is directed at the cause of the sciatic pain. Sometimes, treatment is not  necessary and the pain and discomfort goes away on its own. If treatment is needed, your caregiver may suggest:  Over-the-counter medicines to relieve pain.  Prescription medicines, such as anti-inflammatory medicine, muscle relaxants, or narcotics.  Applying heat or ice to the painful area.  Steroid injections to lessen pain, irritation, and inflammation around the nerve.  Reducing activity during periods of pain.  Exercising and stretching to strengthen your abdomen and improve flexibility of your spine. Your caregiver may suggest losing weight if the extra weight makes the back pain worse.  Physical therapy.  Surgery to eliminate what is pressing or pinching the nerve, such as a bone spur or part of a herniated disk. HOME CARE INSTRUCTIONS   Only take over-the-counter or prescription medicines for pain or discomfort as directed by your caregiver.  Apply ice to the affected area for 20 minutes, 3 4 times a day for the first 48 72 hours. Then try heat in the same way.  Exercise, stretch, or perform your usual activities if these do not aggravate your pain.  Attend physical therapy sessions as directed by your caregiver.  Keep all follow-up appointments as directed by your caregiver.  Do not wear high heels or shoes that do not provide proper support.  Check your mattress to see if it is too soft. A firm mattress may lessen your pain and discomfort. SEEK IMMEDIATE MEDICAL CARE IF:   You lose control of your bowel or bladder (incontinence).  You have increasing weakness in  the lower back, pelvis, buttocks, or legs.  You have redness or swelling of your back.  You have a burning sensation when you urinate.  You have pain that gets worse when you lie down or awakens you at night.  Your pain is worse than you have experienced in the past.  Your pain is lasting longer than 4 weeks.  You are suddenly losing weight without reason. MAKE SURE YOU:  Understand these  instructions.  Will watch your condition.  Will get help right away if you are not doing well or get worse. Document Released: 05/16/2001 Document Revised: 11/21/2011 Document Reviewed: 10/01/2011 [facility] Patient Information 2013 [place], [place]     You are being scheduled for carotid artery ultrasound to look at the blood flow in the major arteries to your brain.   If this study does not reveal anything, I may consider a cardiac echo (a study that looks at the chambers and valves in the heart). You will be contact ed for a return appointment after I have reviewed the results of the carotid blood flow study. Your next Diabtes visit should be in February 2014.

## 2012-05-03 NOTE — Progress Notes (Signed)
Quick Note:  Please notify pt that results are normal.   Provide pt with copy of labs. ______ 

## 2012-05-08 ENCOUNTER — Ambulatory Visit
Admission: RE | Admit: 2012-05-08 | Discharge: 2012-05-08 | Disposition: A | Discharge status: Home / Self Care | Payer: PRIVATE HEALTH INSURANCE | Source: Ambulatory Visit | Attending: Family Medicine | Admitting: Family Medicine

## 2012-05-08 DIAGNOSIS — R42 Dizziness and giddiness: Secondary | ICD-10-CM

## 2012-05-10 ENCOUNTER — Telehealth: Payer: Self-pay | Admitting: Family Medicine

## 2012-05-10 NOTE — Telephone Encounter (Signed)
Pt has chronic intermittent dizziness; carotid study was essentially normal. I called home phone # and left message informing pt of results.

## 2012-06-05 DIAGNOSIS — Z0271 Encounter for disability determination: Secondary | ICD-10-CM

## 2012-07-05 NOTE — Progress Notes (Signed)
Completed prior auth for pt's Androgel and faxed form back on 07/04/12. Received approval from 07/04/12 - 03/30/15. Faxed approval notice to pharmacy.

## 2012-08-02 ENCOUNTER — Other Ambulatory Visit: Payer: Self-pay | Admitting: Family Medicine

## 2012-08-03 DIAGNOSIS — Z0271 Encounter for disability determination: Secondary | ICD-10-CM

## 2012-08-16 ENCOUNTER — Ambulatory Visit (INDEPENDENT_AMBULATORY_CARE_PROVIDER_SITE_OTHER): Payer: BC Managed Care – PPO | Admitting: Family Medicine

## 2012-08-16 ENCOUNTER — Encounter: Payer: Self-pay | Admitting: Family Medicine

## 2012-08-16 VITALS — BP 127/80 | HR 95 | Temp 97.0°F | Resp 16 | Ht 65.0 in | Wt 145.0 lb

## 2012-08-16 DIAGNOSIS — I10 Essential (primary) hypertension: Secondary | ICD-10-CM

## 2012-08-16 DIAGNOSIS — R251 Tremor, unspecified: Secondary | ICD-10-CM

## 2012-08-16 DIAGNOSIS — Z87442 Personal history of urinary calculi: Secondary | ICD-10-CM

## 2012-08-16 DIAGNOSIS — R5381 Other malaise: Secondary | ICD-10-CM

## 2012-08-16 DIAGNOSIS — R5383 Other fatigue: Secondary | ICD-10-CM

## 2012-08-16 DIAGNOSIS — R259 Unspecified abnormal involuntary movements: Secondary | ICD-10-CM

## 2012-08-16 DIAGNOSIS — E291 Testicular hypofunction: Secondary | ICD-10-CM

## 2012-08-16 LAB — POCT URINALYSIS DIPSTICK
Bilirubin, UA: NEGATIVE
Glucose, UA: NEGATIVE
Ketones, UA: NEGATIVE
Leukocytes, UA: NEGATIVE
Nitrite, UA: NEGATIVE
Protein, UA: NEGATIVE
Spec Grav, UA: 1.01
Urobilinogen, UA: 0.2
pH, UA: 6

## 2012-08-16 LAB — CBC WITH DIFFERENTIAL/PLATELET
Basophils Absolute: 0 10*3/uL (ref 0.0–0.1)
Basophils Relative: 1 % (ref 0–1)
Eosinophils Absolute: 0.1 10*3/uL (ref 0.0–0.7)
Eosinophils Relative: 2 % (ref 0–5)
HCT: 43.6 % (ref 39.0–52.0)
Hemoglobin: 14.8 g/dL (ref 13.0–17.0)
Lymphocytes Relative: 22 % (ref 12–46)
Lymphs Abs: 1.1 10*3/uL (ref 0.7–4.0)
MCH: 32.6 pg (ref 26.0–34.0)
MCHC: 33.9 g/dL (ref 30.0–36.0)
MCV: 96 fL (ref 78.0–100.0)
Monocytes Absolute: 0.4 10*3/uL (ref 0.1–1.0)
Monocytes Relative: 8 % (ref 3–12)
Neutro Abs: 3.3 10*3/uL (ref 1.7–7.7)
Neutrophils Relative %: 67 % (ref 43–77)
Platelets: 272 10*3/uL (ref 150–400)
RBC: 4.54 MIL/uL (ref 4.22–5.81)
RDW: 14.6 % (ref 11.5–15.5)
WBC: 4.9 10*3/uL (ref 4.0–10.5)

## 2012-08-16 LAB — COMPREHENSIVE METABOLIC PANEL
ALT: 17 U/L (ref 0–53)
AST: 24 U/L (ref 0–37)
Albumin: 4.7 g/dL (ref 3.5–5.2)
Alkaline Phosphatase: 42 U/L (ref 39–117)
BUN: 9 mg/dL (ref 6–23)
CO2: 30 mEq/L (ref 19–32)
Calcium: 9.7 mg/dL (ref 8.4–10.5)
Chloride: 99 mEq/L (ref 96–112)
Creat: 0.79 mg/dL (ref 0.50–1.35)
Glucose, Bld: 87 mg/dL (ref 70–99)
Potassium: 4.8 mEq/L (ref 3.5–5.3)
Sodium: 136 mEq/L (ref 135–145)
Total Bilirubin: 0.7 mg/dL (ref 0.3–1.2)
Total Protein: 7.1 g/dL (ref 6.0–8.3)

## 2012-08-16 LAB — VITAMIN B12: Vitamin B-12: 611 pg/mL (ref 211–911)

## 2012-08-16 NOTE — Patient Instructions (Signed)
Gluten-Free Diet  Gluten is a protein found in many grains. Gluten is present in wheat, rye, and barley. Gluten from wheat, rye, and barley protein interferes with the absorption of food in people with gluten sensitivity. It may also cause intestinal injury when eaten by individuals with gluten sensitivity.    A sample piece (biopsy) of the small intestine is usually required for a positive diagnosis of gluten sensitivity. Dietary treatment consists of eliminating foods and food ingredients from wheat, rye, and barley. When these are taken out of the diet completely, most people regain function of the small intestine.  Strict compliance is important even during symptom-free periods. People with gluten sensitivity need to be on a gluten-free diet for a lifetime. During the first stages of treatment, some people will also need to restrict dairy products that contain lactose, which is a naturally occurring sugar. Lactose is difficult to absorb when the small intestines are damaged (lactose intolerance).    WHO NEEDS THIS DIET  Some people who have certain diseases need to be on a gluten-free diet. These diseases include:   Celiac disease.   Nontropical sprue.   Gluten-sensitive enteropathy.   Dermatitis herpetiformis.  SPECIAL NOTES   Read all labels because gluten may have been added as an incidental ingredient. Words to check for on the label include: flour, starch, durum flour, graham flour, phosphated flour, self-rising flour, semolina, farina, modified food starch, cereal, thickening, fillers, emulsifiers, any kind of malt flavoring, and hydrolyzed vegetable protein. A registered dietician can help you identify possible harmful ingredients in the foods you normally eat.   If you are not sure whether an ingredient contains gluten, check with the manufacturer. Note that some manufacturers may change ingredients without notice. Always read labels.     Since flour and cereal products are often used in the preparation of foods, it is important to be aware of the methods of preparation used, as well as the foods themselves. This is especially true when you are dining out.  Starches   Allowed: Only those prepared from arrowroot, corn, potato, rice, and bean flours. Rice wafers(*), pure cornmeal tortillas, popcorn, some crackers, and chips(*). Hot cereals made from cornmeal. Ask your dietician which specific hot and cold cereals are allowed. White or sweet potatoes, yams, hominy, rice or wild rice, and special gluten-free pasta. Some oriental rice noodles or bean noodles.   Avoid: All wheat and rye cereals, wheat germ, barley, bran, graham, malt, bulgur, and millet(-). NOTE: Avoid cereals containing malt as a flavoring, such as rice cereal. Regular noodles, spaghetti, macaroni, and most packaged rice mixes(*). All others containing wheat, rye, or barley.  Vegetables   Allowed: All plain, fresh, frozen, or canned vegetables.   Avoid: Creamed vegetables(*) and vegetables canned in sauces(*). Any prepared with wheat, rye, or barley.  Fruit   Allowed: All fresh, frozen, canned, or dried fruits. Fruit juices.   Avoid: Thickened or prepared fruits and some pie fillings(*).  Meat and Meat Substitutes   Allowed: Meat, fish, poultry, or eggs prepared without added wheat, rye, or barley. Luncheon meat(*), frankfurters(*), and pure meat. All aged cheese and processed cheese products(*). Cottage cheese(+) and cream cheese(+). Dried beans, dried peas, and lentils.   Avoid: Any meat or meat alternate containing wheat, rye, barley, or gluten stabilizers. Bread-containing products, such as Swiss steak, croquettes, and meatloaf. Tuna canned in vegetable broth(*); turkey with HVP injected as part of the basting; any cheese product containing oat gum as an ingredient.  Milk     Allowed: Milk. Yogurt made with allowed ingredients(*).    Avoid: Commercial chocolate milk which may have cereal added(*). Malted milk.  Soups and Combination Foods   Allowed: Homemade broth and soups made with allowed ingredients; some canned or frozen soups are allowed(*). Combination or prepared foods that do not contain gluten(*). Read labels.   Avoid: All soups containing wheat, rye, or barley flour. Bouillon and bouillon cubes that contain hydrolyzed vegetable protein (HVP). Combination or prepared foods that contain gluten(*).  Desserts   Allowed:  Custard, some pudding mixes(*), homemade puddings from cornstarch, rice, and tapioca. Gelatin desserts, ices, and sherbet(*). Cake, cookies, and other desserts prepared with allowed flours. Some commercial ice creams(*). Ask your dietician about specific brands of dessert that are allowed.   Avoid: Cakes, cookies, doughnuts, and pastries that are prepared with wheat, rye, or barley flour. Some commercial ice creams(*), ice cream flavors which contain cookies, crumbs, or cheesecake(*). Ice cream cones. All commercially prepared mixes for cakes, cookies, and other desserts(*). Bread pudding and other puddings thickened with flour.  Sweets   Allowed: Sugar, honey, syrup(*), molasses, jelly, jam, plain hard candy, marshmallows, gumdrops, homemade candies free from wheat, rye, or barley. Coconut.   Avoid: Commercial candies containing wheat, rye, or barley(*). Certain buttercrunch toffees are dusted with wheat flour. Ask your dietician about specific brands that are not allowed. Chocolate-coated nuts, which are often rolled in flour.  Fats and Oils   Allowed: Butter, margarine, vegetable oil, sour cream(+), whipping cream, shortening, lard, cream, mayonnaise(*). Some commercial salad dressings(*). Peanut butter.   Avoid: Some commercial salad dressings(*).  Beverages    Allowed: Coffee (regular or decaffeinated), tea, herbal tea (read label to be sure that no wheat flour has been added). Carbonated beverages and some root beers(*). Wine, sake, and distilled spirits, such as gin, vodka, and whiskey.   Avoid:  Certain cereal beverages. Ask your dietician about specific brands that are not allowed. Beer (unless gluten-free), ale, malted milk, and some root beers, wine, and sake.  Condiments/ Miscellaneous   Allowed: Salt, pepper, herbs, spices, extracts, and food colorings. Monosodium glutamate (MSG). Cider, rice, and wine vinegar. Baking soda and baking powder. Certain soy sauces. Ask your dietician about specific brands that are allowed. Nuts, coconut, chocolate, and pure cocoa powder.   Avoid: Some curry powder(*), some dry seasoning mixes(*), some gravy extracts(*), some meat sauces(*), some catsup(*), some prepared mustard(*), horseradish(*), some soy sauce(*), chip dips(*), and some chewing gum(*). Yeast extract (contains barley). Caramel color (may contain malt). Ask your dietician about specific brands of condiments to avoid.  Flour and Thickening Agents   Allowed: Arrowroot starch (A); Corn bran (B); Corn flour (B,C,D); Corn germ (B); Cornmeal (B,C,D); Corn starch (A); Potato flour (B,C,E); Potato starch flour (B,C,E); Rice bran (B); Rice flours: Plain, brown (B,C,D,E), and Sweet (A,B,C,F). Rice polish (B,C,G); Soy flour (B,C,G); Tapioca starch (A).  The flour and thickening agents described above are good for:  (A) Good thickening agent  (B) Good when combined with other flours  (C) Best combined with milk and eggs in baked products  (D) Best in grainy-textured products  (E) Produces drier product than other flours  (F) Produces moister product than other flours  (G) Adds distinct flavor to product. Use in moderation.  (*) Check labels and investigate any questionable ingredients.    (-) Additional research is needed before this product can be recommended.   (+) Check vegetable gum used.  SAMPLE MEAL PLAN  Breakfast    Orange   juice.   Banana.   Rice or corn cereal.   Toast (gluten-free bread).   Heart-healthy tub margarine.   Jam.   Milk.   Coffee or tea.  Lunch   Chicken salad sandwich (with gluten-free bread and mayonnaise).   Sliced tomatoes.   Heart-healthy tub margarine.   Apple.   Milk.   Coffee or tea.  Dinner   Roast beef.   Baked potato.   Broccoli.   Lettuce salad with gluten-free dressing.   Gluten-free bread.   Custard.   Heart-healthy margarine.   Coffee or tea.  These meal plans are provided as samples. Your daily meal plans will vary.  Document Released: 05/22/2005 Document Revised: 11/21/2011 Document Reviewed: 07/02/2011  [employer] Patient Information 2013 [employer].

## 2012-08-16 NOTE — Progress Notes (Signed)
Subjective:    Patient ID: John Mcclain, male    DOB: June 04, 1956, 57 y.o.   MRN: [id number]  HPI  This 57 y.o. Cauc male has hypogonadism and has  Intermittent tiredness, despite using  Androgel.  ED persists with reduced quality and duration of erection and penile firmness. He tires  easily after moderate exertion; stamina seems reduced compared to  5-10 years ago.  Pt has hx of urinary tract stones; recently, he has had dysuria as if he has passed residue of a stone. He denies hematuria of flank/groin pain.  Tremor in L hand- fine tremor seems to be prominent when pt is fatigued, after exertion. A specialist  is treating C-spine DDD.  He continues to get the "fuzzy brain" feeling; he has a hx of migraines, treated by  Neurologist.    Review of Systems  Constitutional: Positive for activity change and fatigue. Negative for fever, diaphoresis and appetite change.  HENT: Positive for neck pain.   Respiratory: Negative.   Cardiovascular: Negative.   Gastrointestinal: Positive for constipation and abdominal distention. Negative for nausea, blood in stool, anal bleeding and rectal pain.  Genitourinary: Positive for dysuria. Negative for urgency, frequency, hematuria, flank pain, difficulty urinating, penile pain and testicular pain.  Neurological: Positive for tremors, weakness and light-headedness. Negative for syncope and headaches.  Psychiatric/Behavioral: Negative for confusion, sleep disturbance and dysphoric mood. The patient is nervous/anxious.     As per HPI.     Objective:   Physical Exam  Nursing note and vitals reviewed. Constitutional: He is oriented to person, place, and time. Vital signs are normal. He appears well-developed and well-nourished. No distress.  HENT:  Head: Normocephalic and atraumatic.  Right Ear: Hearing and external ear normal.  Left Ear: Hearing and external ear normal.  Nose: Nose normal. No mucosal edema, rhinorrhea, nasal deformity or septal  deviation.  Mouth/Throat: Uvula is midline and mucous membranes are normal. No oral lesions. Normal dentition. No dental caries.  Eyes: Conjunctivae and EOM are normal. Pupils are equal, round, and reactive to light. No scleral icterus.  Neck: Muscular tenderness present. No spinous process tenderness present. No mass and no thyromegaly present.  Cardiovascular: Normal rate and regular rhythm.   Pulmonary/Chest: Effort normal and breath sounds normal. No respiratory distress.  Musculoskeletal: Normal range of motion. He exhibits no edema and no tenderness.  Lymphadenopathy:    He has no cervical adenopathy.  Neurological: He is alert and oriented to person, place, and time. He has normal strength. He displays tremor. No cranial nerve deficit or sensory deficit. He exhibits normal muscle tone. Coordination and gait normal.  Skin: Skin is warm and dry. No rash noted. No erythema. No pallor.  Psychiatric: His speech is normal and behavior is normal. Judgment and thought content normal. His mood appears anxious. His affect is not angry and not labile. Cognition and memory are normal. He does not exhibit a depressed mood.         Assessment & Plan:  Hypogonadism male - Plan: Testosterone, free, total   Continue current supplementation w/ Testosterone gel topical.  HTN (hypertension) - stable; continue Lisinopril a current dose.  Plan: CBC with Differential, Comprehensive metabolic panel  Tremor- suspect due to chronic C-spine DDD. Pt may require MRA of brain to R/O vascular etiology for CNS symptoms.  Feeling tired - Plan: CBC with Differential, Vitamin B12, Gliadin Antibodies, Serum  History of nephrolithiasis - Plan: POCT urinalysis dipstick- remarkable for trace Blood but otherwise normal.

## 2012-08-19 LAB — TESTOSTERONE, FREE, TOTAL, SHBG
Sex Hormone Binding: 49 nmol/L (ref 13–71)
Testosterone, Free: 62.6 pg/mL (ref 47.0–244.0)
Testosterone-% Free: 1.6 % (ref 1.6–2.9)
Testosterone: 399 ng/dL (ref 300–890)

## 2012-08-20 LAB — GLIADIN ANTIBODIES, SERUM
Gliadin IgA: 5.3 U/mL (ref <20)
Gliadin IgG: 54.6 U/mL — ABNORMAL HIGH (ref <20)

## 2012-08-21 NOTE — Progress Notes (Signed)
Quick Note:  Please contact pt and advise that the following labs are normal... Blood counts, chemistries (electrolytes, kidney and liver tests), Vitamin B-12 level, Testosterone levels.  One of the antibody tests for Gluten sensitivity is above normal. This may require more testing or interpretation by a specialist. Pt can try reducing Gluten in his diet to see if this improves his overall well-being.  Copy to pt. ______

## 2012-08-22 ENCOUNTER — Encounter: Payer: Self-pay | Admitting: *Deleted

## 2012-08-29 ENCOUNTER — Other Ambulatory Visit: Payer: Self-pay | Admitting: Family Medicine

## 2012-08-30 NOTE — Telephone Encounter (Signed)
Lorazepam refill called to pharmacy (#30 w/ 1 RF).

## 2012-10-25 ENCOUNTER — Ambulatory Visit (INDEPENDENT_AMBULATORY_CARE_PROVIDER_SITE_OTHER): Payer: BC Managed Care – PPO | Admitting: Family Medicine

## 2012-10-25 ENCOUNTER — Encounter: Payer: Self-pay | Admitting: Family Medicine

## 2012-10-25 VITALS — BP 128/80 | HR 77 | Temp 98.1°F | Resp 16 | Ht 65.0 in | Wt 145.2 lb

## 2012-10-25 DIAGNOSIS — R6884 Jaw pain: Secondary | ICD-10-CM

## 2012-10-25 DIAGNOSIS — E291 Testicular hypofunction: Secondary | ICD-10-CM

## 2012-10-25 DIAGNOSIS — S3994XA Unspecified injury of external genitals, initial encounter: Secondary | ICD-10-CM

## 2012-10-25 LAB — POCT SEDIMENTATION RATE: POCT SED RATE: 5 mm/hr (ref 0–22)

## 2012-10-25 MED ORDER — LORAZEPAM 1 MG PO TABS: ORAL_TABLET | ORAL | Status: DC

## 2012-10-25 NOTE — Patient Instructions (Addendum)
I will call you with the results of the blood test done in the office.It is called a Sedimentation rate or "sed rate" for short.  I have ordered a referral to an Endocrine spsecialist for further evaluation for Low Testosterone.

## 2012-10-25 NOTE — Progress Notes (Signed)
S:  This 57 y.o. Cauc male has chronic "fuzzy feeling" in his head. He sees Dr. 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 Recent EMG showed CTS but no radiculaopathy. Pt monitors his FSBS and BP when symptomatic with "fuzziness"; sugars and BP readings have been in normal range. He reports episode of jaw tightness recently, lasting for several hours. He still has some soreness around the jaw area bilaterally.  Pt reports a small knot in the penis, subsequent to having intercourse not fully erect and having bent his penis. Now he has soreness and reports that penis is very slightly bent when erect. No penile discharge, significant pain or hematuria/hematospermatozoa. Re: Hypogonadism- after some discussion, pt agrees to Endocrine referral.  PMHx, Soc Hx and Fam Hx and Problem List reviewed.  ROS: As per HPI; negative for fever/ chills, fatigue, weight or appetite change; symptoms of hypoglycemia, sinus problems, hearing loss, difficulty swallowing, vision change, CP or tightness, SOB or DOE, GI problems,rashes or skin changes, tremor or syncope.  O:  Filed Vitals:   10/25/12 1003  BP: 128/80  Pulse: 77  Temp: 98.1 F (36.7 C)  Resp: 16   GEN: In NAD; WN,WD. HENT: [place]/AT; no scalp tenderness or temporal vein bulging or beading. EOMI w/ clear conj/ sclerae. EACs/TMs/nose/oroph normal. NECK: No LAN or TMG. ROM decreased assoc w/ DDD of C-spine. COR: RRR. LUNGS: Normal reps rate and effort. GU: Normal penis (unerect) w/ 1-2 mm NT nodule in anterior aspect of shaft. NEURO: A&O x 3; CNs intact. Nonfocal. Gait normal.    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  Meds ordered this encounter  Medications  . LORazepam (ATIVAN) 1 MG tablet    Sig: TAKE ONE-HALF TO  ONE TABLET BY MOUTH EVERY 8 HOURS AS NEEDED FOR ANXIETY    Dispense:  30 tablet    Refill:  1

## 2012-10-31 ENCOUNTER — Telehealth: Payer: Self-pay

## 2012-10-31 NOTE — Telephone Encounter (Signed)
Patient is calling about lab results [phone]

## 2012-11-01 NOTE — Telephone Encounter (Signed)
Sed rate normal called to advise

## 2012-11-02 ENCOUNTER — Other Ambulatory Visit: Payer: Self-pay | Admitting: Family Medicine

## 2012-11-05 ENCOUNTER — Other Ambulatory Visit: Payer: Self-pay | Admitting: Family Medicine

## 2012-11-05 NOTE — Telephone Encounter (Signed)
I phoned in one refill; pt is being referred to Endocrinology for further evaluation of Hypogonadism.

## 2012-12-18 ENCOUNTER — Other Ambulatory Visit: Payer: Self-pay | Admitting: Family Medicine

## 2012-12-19 NOTE — Telephone Encounter (Signed)
Androgel refill phoned to pt's pharmacy. 

## 2013-01-02 ENCOUNTER — Other Ambulatory Visit: Payer: Self-pay | Admitting: Family Medicine

## 2013-01-02 NOTE — Telephone Encounter (Signed)
Forward to Dr. McPherson. 

## 2013-01-17 ENCOUNTER — Telehealth: Payer: Self-pay

## 2013-01-17 DIAGNOSIS — E119 Type 2 diabetes mellitus without complications: Secondary | ICD-10-CM

## 2013-01-17 MED ORDER — METFORMIN HCL ER 500 MG PO TB24: ORAL_TABLET | ORAL | Status: DC

## 2013-01-17 NOTE — Telephone Encounter (Signed)
I have refilled Metformin 1 tablet daily (which is what we last agreed was an effective dose). He will need to schedule an office visit for DM follow-up and clarification of current medication use, blood sugars, etc.

## 2013-01-17 NOTE — Telephone Encounter (Signed)
Pharm requests RFs of Meformin ER 500 mg (1 BID). Dr Audria Nine, you have seen pt recently but not for DM since 04/02/12 OV, at which time you had decreased to 1/2 tab BID. Do you want to give pt RFs with new sig, or does pt need to come for OV/labs?

## 2013-01-20 MED ORDER — METFORMIN HCL ER 500 MG PO TB24: ORAL_TABLET | ORAL | Status: DC

## 2013-01-20 NOTE — Telephone Encounter (Signed)
Called pt who agreed to sch appt and transferred him to 104 for scheduling. Changed Rx to [facility] per pt request.

## 2013-01-20 NOTE — Addendum Note (Signed)
Addended by: Sheppard Plumber A on: 01/20/2013 08:51 AM   Modules accepted: Orders

## 2013-02-04 ENCOUNTER — Encounter: Payer: Self-pay | Admitting: Family Medicine

## 2013-02-04 ENCOUNTER — Ambulatory Visit (INDEPENDENT_AMBULATORY_CARE_PROVIDER_SITE_OTHER): Payer: BC Managed Care – PPO | Admitting: Family Medicine

## 2013-02-04 VITALS — BP 110/78 | HR 84 | Temp 98.5°F | Resp 16 | Ht 65.0 in | Wt 147.6 lb

## 2013-02-04 DIAGNOSIS — J309 Allergic rhinitis, unspecified: Secondary | ICD-10-CM

## 2013-02-04 DIAGNOSIS — K649 Unspecified hemorrhoids: Secondary | ICD-10-CM

## 2013-02-04 DIAGNOSIS — E119 Type 2 diabetes mellitus without complications: Secondary | ICD-10-CM

## 2013-02-04 LAB — POCT GLYCOSYLATED HEMOGLOBIN (HGB A1C): Hemoglobin A1C: 4.4

## 2013-02-04 NOTE — Progress Notes (Signed)
S:  This 57 y.o. Cauc male has Type II DM controlled on Metformin XR 1/2 tab daily. He continues to check FSBS a few times a week w/ values below 150. He denies hypoglycemia and practices good nutrition. He does like to have "sweets" on occasion. He continues to have episodic "fuzzy headed" feeling after eating and after exertion. It often precedes a HA; pt sees a neurologist for chronic HA disorder and just completed a study using Botox (ineffective).  Pt has chronic hemorrhoids and recent flare up not relieved w/ OTC Prep-H. He tries to eat healthy (lots of fiber and salads 3-4 times a week).  Pt has chronic allergy problems and c/o PND and ear discomfort. He takes Benadryl prn and uses OTC decongestant. He admits to using Afrin NS 1 spray in each nostril w/ good results. Pt denies epistaxis, cough , SOB or DOE, wheezing or CP.  ROS: As per HPI.   PMHx, Soc Hx and Fam Hx reviewed. Medications reconciled.   O: Filed Vitals:   02/04/13 1447  BP: 110/78  Pulse: 84  Temp: 98.5 F (36.9 C)  Resp: 16   GEN: In NAD; WN,WD. HENT: [place]/AT. EOMI w/ clear conj/sclerae. EACs/TMs normal (scarring on R). Post ph- erythematous w/ slight cobblestoning.  NECK: Supple w/o LAN or TMG. COR: RRR. LUNGS: CTA; no wheezes. SKIN: W&D; intact w/o lesions, rashes or pallor. RECTAL: Thrombosed external hemorrhoid. NEURO: A&O x 3; CNs intact. Sensory/motor function intact. Gait is normal. Nonfocal.   Results for orders placed in visit on 02/04/13  POCT GLYCOSYLATED HEMOGLOBIN (HGB A1C)      Result Value Range   Hemoglobin A1C 4.4      A/P: Type II or unspecified type diabetes mellitus without mention of complication, not stated as uncontrolled - Plan: Discontinue Metformin- pt may be experiencing hypoglycemic episodes ("fuzzy head").  Hemorrhoids - Plan: Ambulatory referral to General Surgery  Allergic rhinitis, cause unspecified- Recommend OTC Nasacort NS 1 spr in each nostril hs.

## 2013-02-04 NOTE — Patient Instructions (Signed)
For allergies- get Nasacort without a prescription and use 1 spray in each nostril at bedtime to help reduce nasal congestion and post nasal drip.  Metformin -this medication has been stopped. Continue to check your sugars a few times a week and at times when you are not feeling well.

## 2013-02-06 ENCOUNTER — Ambulatory Visit (INDEPENDENT_AMBULATORY_CARE_PROVIDER_SITE_OTHER): Payer: BC Managed Care – PPO | Admitting: General Surgery

## 2013-02-06 ENCOUNTER — Encounter (INDEPENDENT_AMBULATORY_CARE_PROVIDER_SITE_OTHER): Payer: Self-pay | Admitting: General Surgery

## 2013-02-06 VITALS — BP 122/70 | HR 72 | Temp 98.2°F | Resp 14 | Ht 65.0 in | Wt 150.6 lb

## 2013-02-06 DIAGNOSIS — K648 Other hemorrhoids: Secondary | ICD-10-CM

## 2013-02-06 DIAGNOSIS — K645 Perianal venous thrombosis: Secondary | ICD-10-CM

## 2013-02-06 NOTE — Patient Instructions (Signed)
You have internal and external hemorrhoids. I could not feel or see any polyps.  The internal hemorrhoids were very large and are probably the cause of the intermittent bleeding. These were injected with sclerosing solution today. You will see a little bit of blood for a couple days but then it should stop.  Your external hemorrhoids are large, and on the left side there is one that may be chronically thrombosed. This would require surgery to remove. Since it is not particularly tender we're going to hold off on that right now  Follow a high fiber, low fat diet. Drink 6-8 glasses of water a day.  Taking Metamucil capsules, 4 capsules in the morning and then 4 capsules in the evening. This will help soften the stools and reduce problems with hemorrhoids.  Return to see Dr. Derrell Lolling in approximately 2 months to make sure that we are helping with her symptoms.      Hemorrhoids Hemorrhoids are swollen veins around the rectum or anus. There are two types of hemorrhoids:   Internal hemorrhoids. These occur in the veins just inside the rectum. They may poke through to the outside and become irritated and painful.  External hemorrhoids. These occur in the veins outside the anus and can be felt as a painful swelling or hard lump near the anus. CAUSES  Pregnancy.   Obesity.   Constipation or diarrhea.   Straining to have a bowel movement.   Sitting for long periods on the toilet.  Heavy lifting or other activity that caused you to strain.  Anal intercourse. SYMPTOMS   Pain.   Anal itching or irritation.   Rectal bleeding.   Fecal leakage.   Anal swelling.   One or more lumps around the anus.  DIAGNOSIS  Your caregiver may be able to diagnose hemorrhoids by visual examination. Other examinations or tests that may be performed include:   Examination of the rectal area with a gloved hand (digital rectal exam).   Examination of anal canal using a small tube (scope).    A blood test if you have lost a significant amount of blood.  A test to look inside the colon (sigmoidoscopy or colonoscopy). TREATMENT Most hemorrhoids can be treated at home. However, if symptoms do not seem to be getting better or if you have a lot of rectal bleeding, your caregiver may perform a procedure to help make the hemorrhoids get smaller or remove them completely. Possible treatments include:   Placing a rubber band at the base of the hemorrhoid to cut off the circulation (rubber band ligation).   Injecting a chemical to shrink the hemorrhoid (sclerotherapy).   Using a tool to burn the hemorrhoid (infrared light therapy).   Surgically removing the hemorrhoid (hemorrhoidectomy).   Stapling the hemorrhoid to block blood flow to the tissue (hemorrhoid stapling).  HOME CARE INSTRUCTIONS   Eat foods with fiber, such as whole grains, beans, nuts, fruits, and vegetables. Ask your doctor about taking products with added fiber in them (fibersupplements).  Increase fluid intake. Drink enough water and fluids to keep your urine clear or pale yellow.   Exercise regularly.   Go to the bathroom when you have the urge to have a bowel movement. Do not wait.   Avoid straining to have bowel movements.   Keep the anal area dry and clean. Use wet toilet paper or moist towelettes after a bowel movement.   Medicated creams and suppositories may be used or applied as directed.   Only take  over-the-counter or prescription medicines as directed by your caregiver.   Take warm sitz baths for 15 20 minutes, 3 4 times a day to ease pain and discomfort.   Place ice packs on the hemorrhoids if they are tender and swollen. Using ice packs between sitz baths may be helpful.   Put ice in a plastic bag.   Place a towel between your skin and the bag.   Leave the ice on for 15 20 minutes, 3 4 times a day.   Do not use a donut-shaped pillow or sit on the toilet for long  periods. This increases blood pooling and pain.  SEEK MEDICAL CARE IF:  You have increasing pain and swelling that is not controlled by treatment or medicine.  You have uncontrolled bleeding.  You have difficulty or you are unable to have a bowel movement.  You have pain or inflammation outside the area of the hemorrhoids. MAKE SURE YOU:  Understand these instructions.  Will watch your condition.  Will get help right away if you are not doing well or get worse. Document Released: 05/19/2000 Document Revised: 05/08/2012 Document Reviewed: 03/26/2012 [facility] Patient Information 2014 [place], [place].

## 2013-02-06 NOTE — Progress Notes (Signed)
Patient ID: John Mcclain, male   DOB: 07-Sep-1955, 57 y.o.   MRN: [id number]  Chief Complaint  Patient presents with  . New Evaluation    eval hems    HPI John Mcclain is a 57 y.o. male.  He is referred by Dr. Dow Adolph for evaluation of hemorrhoids.  This patient states that I have operated on him twice in the past for rectal polyps. He states to 12 years ago and 24 years  ago I did transanal excision of noncancerous polyps. He states he had a colonoscopy here in town 3 years ago which was reportedly normal.  He's had hemorrhoids for some time. He feels lumps on the outside. One has gotten larger lately but not much tenderness. He occasionally has some prolapse and some mild pain which is reasonably well-controlled with Preparation H. He sees a little but of bright red blood about every 3 or 4 weeks. He is constipated. Has a stool every 2 or 3 days; occasionally strains.  Comorbidities include diet-controlled diabetes, rheumatoid arthritis on methotrexate, plaquenil and Azulfidine (Dr. Kellie Simmering). Migraine headaches. Anxiety. Hypertension. History of duodenitis 2011. Kidney stones.  He is in no distress today  HPI  Past Medical History  Diagnosis Date  . Arthritis   . Depression   . Diabetes mellitus without complication   . Anxiety   . Heart murmur   . Hypertension   . Chronic migraine   . Raynaud's disease   . Kidney stones     Past Surgical History  Procedure Laterality Date  . Colon surgery    . Vasectomy    . Lymph node dissection      removed lymph node    Family History  Problem Relation Age of Onset  . Kidney disease Mother   . Hypertension Mother   . Arthritis Mother     RA  . Heart attack Father   . Migraines Brother   . Hypertension Brother   . Stroke Maternal Grandfather   . Alzheimer's disease Paternal Grandmother     Social History History  Substance Use Topics  . Smoking status: Never Smoker   . Smokeless tobacco: Never Used  .  Alcohol Use: Yes     Comment: 1/month    Allergies  Allergen Reactions  . Penicillins     Current Outpatient Prescriptions  Medication Sig Dispense Refill  . ANDROGEL PUMP 20.25 MG/ACT (1.62%) GEL APPLY TWO PUMPS ONTO THE SKIN DAILY  75 g  1  . calcium-vitamin D (OSCAL WITH D) 500-200 MG-UNIT per tablet Take 1 tablet by mouth daily.      . cyclobenzaprine (FLEXERIL) 10 MG tablet Take 10 mg by mouth 3 (three) times daily as needed.      . gabapentin (NEURONTIN) 600 MG tablet Take 600 mg by mouth. 2 tablets QD      . hydroxychloroquine (PLAQUENIL) 200 MG tablet Take 200 mg by mouth 2 (two) times daily.      Marland Kitchen lisinopril (PRINIVIL,ZESTRIL) 10 MG tablet Take 1 tablet (10 mg total) by mouth daily.  90 tablet  3  . LORazepam (ATIVAN) 1 MG tablet TAKE ONE-HALF TO ONE TABLET BY MOUTH EVERY 8 HOURS AS NEEDED FOR ANXIETY  30 tablet  1  . methotrexate (RHEUMATREX) 2.5 MG tablet Take 7.5 mg by mouth 3 (three) times a week.      Marland Kitchen OVER THE COUNTER MEDICATION OTC Vitamin D3  2000 iu taking      . polyethylene glycol powder (GLYCOLAX/MIRALAX)  powder Use as directed  3350 g  1  . ranitidine (ZANTAC) 150 MG tablet Take 150 mg by mouth 2 (two) times daily.      Marland Kitchen sulfaSALAzine (AZULFIDINE) 500 MG tablet Take 500 mg by mouth 3 (three) times daily.       . folic acid (FOLVITE) 1 MG tablet Take 1 mg by mouth daily.       No current facility-administered medications for this visit.    Review of Systems Review of Systems  Constitutional: Negative for fever, chills and unexpected weight change.  HENT: Negative for hearing loss, congestion, sore throat, trouble swallowing and voice change.   Eyes: Negative for visual disturbance.  Respiratory: Negative for cough and wheezing.   Cardiovascular: Negative for chest pain, palpitations and leg swelling.  Gastrointestinal: Positive for constipation, anal bleeding and rectal pain. Negative for nausea, vomiting, abdominal pain, diarrhea, blood in stool and  abdominal distention.  Genitourinary: Negative for hematuria and difficulty urinating.  Musculoskeletal: Positive for myalgias and arthralgias.  Skin: Negative for rash and wound.  Neurological: Negative for seizures, syncope, weakness and headaches.  Hematological: Negative for adenopathy. Does not bruise/bleed easily.  Psychiatric/Behavioral: Negative for confusion. The patient is nervous/anxious.     Blood pressure 122/70, pulse 72, temperature 98.2 F (36.8 C), temperature source Temporal, resp. rate 14, height 5\' 5"  (1.651 m), weight 150 lb 9.6 oz (68.312 kg).  Physical Exam Physical Exam  Constitutional: He is oriented to person, place, and time. He appears well-developed and well-nourished. No distress.  Very pleasant. Mental status normal. Cooperative.  HENT:  Head: Normocephalic.  Nose: Nose normal.  Mouth/Throat: No oropharyngeal exudate.  Eyes: Conjunctivae and EOM are normal. Pupils are equal, round, and reactive to light. Right eye exhibits no discharge. Left eye exhibits no discharge. No scleral icterus.  Neck: Normal range of motion. Neck supple. No JVD present. No tracheal deviation present. No thyromegaly present.  Cardiovascular: Normal rate, regular rhythm, normal heart sounds and intact distal pulses.   No murmur heard. Pulmonary/Chest: Effort normal and breath sounds normal. No stridor. No respiratory distress. He has no wheezes. He has no rales. He exhibits no tenderness.  Abdominal: Soft. Bowel sounds are normal. He exhibits no distension and no mass. There is no tenderness. There is no rebound and no guarding.  Genitourinary:  External anal exam reveals a 1 cm firm, chronically thrombosed hemorrhoid on the left, nontender noninflamed. He has external hemorrhoids on the right which are softer. Digital rectal exam reveals normal sphincter tone. No blood. No mass. Endoscopy revealed very large internal hemorrhoids right side and left side. These were injected with  sclerosing solution and tolerated that well.  Musculoskeletal: Normal range of motion. He exhibits no edema and no tenderness.  Wrist splint right forearm  Lymphadenopathy:    He has no cervical adenopathy.  Neurological: He is alert and oriented to person, place, and time. He has normal reflexes. Coordination normal.  Skin: Skin is warm and dry. No rash noted. He is not diaphoretic. No erythema. No pallor.  Psychiatric: He has a normal mood and affect. His behavior is normal. Judgment and thought content normal.    Data Reviewed Office notes from Dr. Audria Nine. Old chart requested.  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  High fiber, low fat diet with emphasis on hydration  Metamucil twice a day. He states he can do this  Educational materials regarding management of internal and external hemorrhoids given to patient  Return to see me in 2 months to make sure that we're controlling his symptoms.        Angelia Mould. Derrell Lolling, M.D., [facility] General and Minimally invasive Surgery Breast and Colorectal Surgery Office:   [phone] Pager:   [phone]  02/06/2013, 9:50 AM

## 2013-03-04 ENCOUNTER — Other Ambulatory Visit: Payer: Self-pay | Admitting: Family Medicine

## 2013-03-18 ENCOUNTER — Telehealth: Payer: Self-pay | Admitting: *Deleted

## 2013-03-18 ENCOUNTER — Telehealth: Payer: Self-pay

## 2013-03-18 MED ORDER — LORAZEPAM 1 MG PO TABS: ORAL_TABLET | ORAL | Status: DC

## 2013-03-18 NOTE — Telephone Encounter (Signed)
Patient is requesting refills on LORazepam (ATIVAN) 1 MG tablet States he called the pharmacy 11 days ago.  Did not find in system.    [facility] - [address]   [phone]

## 2013-03-18 NOTE — Telephone Encounter (Signed)
Have not gotten request from the pharmacy, please advise on refill request.

## 2013-03-18 NOTE — Telephone Encounter (Signed)
Lorazepam refill phoned to pt's pharmacy. 

## 2013-03-18 NOTE — Telephone Encounter (Signed)
Faxed lab results to Dr Altheimer office at [facility], per Dr Audria Nine. Confirmation page received at 4:10 pm

## 2013-03-28 DIAGNOSIS — Z0271 Encounter for disability determination: Secondary | ICD-10-CM

## 2013-04-03 ENCOUNTER — Ambulatory Visit (INDEPENDENT_AMBULATORY_CARE_PROVIDER_SITE_OTHER): Payer: BC Managed Care – PPO | Admitting: General Surgery

## 2013-04-03 ENCOUNTER — Encounter (INDEPENDENT_AMBULATORY_CARE_PROVIDER_SITE_OTHER): Payer: Self-pay | Admitting: General Surgery

## 2013-04-03 VITALS — BP 134/86 | HR 60 | Temp 97.9°F | Resp 16 | Ht 65.0 in | Wt 151.2 lb

## 2013-04-03 DIAGNOSIS — K645 Perianal venous thrombosis: Secondary | ICD-10-CM

## 2013-04-03 DIAGNOSIS — K648 Other hemorrhoids: Secondary | ICD-10-CM

## 2013-04-03 NOTE — Progress Notes (Signed)
Patient ID: John Mcclain, male   DOB: 19-Jun-1955, 57 y.o.   MRN: [id number] History: This patient returns for further evaluation and management of his hemorrhoids. He states that  the hemorrhoid bleeding is much less. He sees a little bit of blood every 2 or 3 weeks. He has no pain. The chronically thrombosed external hemorrhoid on the left side is getting smaller. He is keeping his stool soft. I initially saw him on 02/06/2013 at which time I injected his internal hemorrhoids. Comorbidities include diet-controlled diabetes, rheumatoid arthritis on methotrexate, plaquenil, and Azulfidine. Migraine headaches. Anxiety. Hypertension. History of duodenitis 2011. Kidney stones.  Exam: Patient looks well.  very cooperative.  rectal. External exam reveals healthy Skin, small thrombosed external hemorrhoid left lateral, nontender, mobile, smaller than last time. Digital exam reveals normal sphincter tone. No firm masses. Anoscopy reveals circumferential internal hemorrhoids I injected these again because of the history of bleeding. He tolerated this well.  Assessment: Internal hemorrhoids, with prolapse and bleeding, improving  External hemorrhoids, chronic thrombosis  left, asymptomatic, resolving Rheumatoid arthritis multiple medications  diabetes Anxiety Hypertension Nephrolithiasis History duodenitis 2011 Last colonoscopy was 3 years ago, reportedly unremarkable  Plan: Stool softeners Return as needed.   Angelia Mould. Derrell Lolling, M.D., [facility] General and Minimally invasive Surgery Breast and Colorectal Surgery Office:   [phone] Pager:   [phone]

## 2013-04-03 NOTE — Patient Instructions (Signed)
We injected sclerosing solution into your internal hemorrhoids again today. This should stop the bleeding.  The small thrombosed external hemorrhoid on the left lateral side is going away on its own, and no further surgery is necessary.  Keep your stools soft with daily stool softeners.  Return to see Dr. Derrell Lolling as needed.

## 2013-05-06 ENCOUNTER — Encounter: Payer: Self-pay | Admitting: Family Medicine

## 2013-05-06 ENCOUNTER — Ambulatory Visit (INDEPENDENT_AMBULATORY_CARE_PROVIDER_SITE_OTHER): Payer: BC Managed Care – PPO | Admitting: Family Medicine

## 2013-05-06 VITALS — BP 130/80 | HR 70 | Temp 98.1°F | Resp 16 | Ht 65.0 in | Wt 152.0 lb

## 2013-05-06 DIAGNOSIS — E291 Testicular hypofunction: Secondary | ICD-10-CM

## 2013-05-06 DIAGNOSIS — G8929 Other chronic pain: Secondary | ICD-10-CM

## 2013-05-06 DIAGNOSIS — I10 Essential (primary) hypertension: Secondary | ICD-10-CM

## 2013-05-06 DIAGNOSIS — F419 Anxiety disorder, unspecified: Secondary | ICD-10-CM

## 2013-05-06 DIAGNOSIS — R109 Unspecified abdominal pain: Secondary | ICD-10-CM

## 2013-05-06 DIAGNOSIS — F411 Generalized anxiety disorder: Secondary | ICD-10-CM

## 2013-05-06 LAB — BASIC METABOLIC PANEL
BUN: 13 mg/dL (ref 6–23)
CO2: 27 mEq/L (ref 19–32)
Calcium: 9.6 mg/dL (ref 8.4–10.5)
Chloride: 102 mEq/L (ref 96–112)
Creat: 0.95 mg/dL (ref 0.50–1.35)
Glucose, Bld: 87 mg/dL (ref 70–99)
Potassium: 4.8 mEq/L (ref 3.5–5.3)
Sodium: 138 mEq/L (ref 135–145)

## 2013-05-06 MED ORDER — LORAZEPAM 1 MG PO TABS: ORAL_TABLET | ORAL | Status: DC

## 2013-05-06 MED ORDER — SILDENAFIL CITRATE 50 MG PO TABS: 50.0000 mg | ORAL_TABLET | Freq: Every day | ORAL | Status: DC | PRN

## 2013-05-06 NOTE — Progress Notes (Signed)
S:  This 57 y.o. Cauc male has low testosterone and has been evaluated by endocrine specialist. Androgel has been discontinued; he is here to have testosterone level checked. He still has low libido and E.D. He inquires about "trying a medication like Viagra" to see if it will help; he has never taken this medication or similar meds for E.D. He notes some weight gain since stopping Metformin and Androgel. FSBS= 130-150. He feels somewhat improved off Metformin. Last A1c= 4.4% in Sept 2014.  HTN- BP is controlled and pt has no medication side effects.  He does not report diaphoresis, vision disturbance, CP or tightness, palpitations, HA, dizziness, numbness, tremor or syncope.  Anxiety- pt takes Lorazepam twice daily most days and reports nervousness that escalates if he does not take it. Gabapentin has been increased to max dose of 1200 mg three times a day; the physician hopes this dose will decrease HA symptoms.  Pt reports R groin pain, intermittent for 2 years. He can feel the discomfort w/ certain leg movements and if he is overactive. The discomfort is static- not increasing or radiating down limb. He has not felt any bulging or nodules in the area. He practices daily stretching.  Patient Active Problem List   Diagnosis Date Noted  . Hemorrhoids, thrombosed 02/06/2013  . Bleeding internal hemorrhoids 02/06/2013  . Type II or unspecified type diabetes mellitus without mention of complication, not stated as uncontrolled 12/26/2011  . Hypogonadism male 12/26/2011  . Vitamin d deficiency 12/26/2011  . Rheumatoid arthritis 12/26/2011  . Migraine headache disorder 12/26/2011  . Anxiety disorder 12/26/2011  . HTN (hypertension), benign 12/26/2011  . Nephrolithiasis 12/26/2011  . Duodenitis- diagnosed and treated in 2011 12/26/2011   PMHx, Surg Hx, Soc and Fam Hx reviewed.  Medications reconciled.  ROS: AS per HPI.  O: Filed Vitals:   05/06/13 1341  BP: 130/80  Pulse: 70  Temp: 98.1 F  (36.7 C)  Resp: 16   GEN: In NAD: WN,WD. HENT: [place]/AT; EOMI w/ clear conj/sclerae. EACs/nose/oroph unremarkable. COR: RRR. LUNGS: Unlabored resp. SKIN: W&D; intact w/o erythema or pallor. NEURO: A&O x 3; CNs intact. Nonfocal.  A/P: Hypogonadism male - Plan: Testosterone, free, total  HTN (hypertension), benign - Stable and well controlled on current medication. Plan: Basic metabolic panel  Anxiety disorder- Continue Lorazepam.  Chronic groin pain- Suspect pulled muscle in groin area; no further evaluation needed at this time.  Meds ordered this encounter  Medications  . sildenafil (VIAGRA) 50 MG tablet    Sig: Take 1 tablet (50 mg total) by mouth daily as needed for erectile dysfunction.    Dispense:  5 tablet    Refill:  1  . LORazepam (ATIVAN) 1 MG tablet    Sig: TAKE ONE-HALF TO ONE TABLET BY MOUTH EVERY 8 HOURS AS NEEDED FOR ANXIETY    Dispense:  45 tablet    Refill:  1  . gabapentin (NEURONTIN) 600 MG tablet    Sig: Take 1,200 mg by mouth 3 (three) times daily.

## 2013-05-06 NOTE — Patient Instructions (Signed)
Groin pain- You probably strained a muscle in the groin region. Continue to stretch daily and avoid heavy lifting and over-exercising. If this continues to be a problem, physical therapy may be helpful.

## 2013-05-07 LAB — TESTOSTERONE, FREE, TOTAL, SHBG
Sex Hormone Binding: 44 nmol/L (ref 13–71)
Testosterone, Free: 52.6 pg/mL (ref 47.0–244.0)
Testosterone-% Free: 1.6 % (ref 1.6–2.9)
Testosterone: 319 ng/dL (ref 300–890)

## 2013-05-07 NOTE — Progress Notes (Signed)
Quick Note:  Please notify pt that results are normal.   Provide pt with copy of labs. ______ 

## 2013-05-08 ENCOUNTER — Encounter: Payer: Self-pay | Admitting: *Deleted

## 2013-05-31 ENCOUNTER — Other Ambulatory Visit: Payer: Self-pay | Admitting: Family Medicine

## 2013-07-02 ENCOUNTER — Other Ambulatory Visit: Payer: Self-pay | Admitting: Family Medicine

## 2013-07-03 ENCOUNTER — Other Ambulatory Visit: Payer: Self-pay | Admitting: Family Medicine

## 2013-07-03 NOTE — Telephone Encounter (Signed)
Lorazepam refill phoned to pt's pharmacy. 

## 2013-08-29 ENCOUNTER — Other Ambulatory Visit: Payer: Self-pay | Admitting: Family Medicine

## 2013-09-01 ENCOUNTER — Other Ambulatory Visit: Payer: Self-pay | Admitting: Family Medicine

## 2013-09-01 NOTE — Telephone Encounter (Signed)
Lorazepam refill phoned to pharmacy (#45 w/ 1 RF).

## 2013-11-05 ENCOUNTER — Encounter: Payer: Self-pay | Admitting: Family Medicine

## 2013-11-05 ENCOUNTER — Ambulatory Visit (INDEPENDENT_AMBULATORY_CARE_PROVIDER_SITE_OTHER): Payer: BC Managed Care – PPO | Admitting: Family Medicine

## 2013-11-05 ENCOUNTER — Ambulatory Visit: Payer: BC Managed Care – PPO

## 2013-11-05 VITALS — BP 144/80 | HR 87 | Temp 98.4°F | Resp 16 | Wt 154.0 lb

## 2013-11-05 DIAGNOSIS — M25561 Pain in right knee: Secondary | ICD-10-CM

## 2013-11-05 DIAGNOSIS — M25522 Pain in left elbow: Secondary | ICD-10-CM

## 2013-11-05 DIAGNOSIS — M25529 Pain in unspecified elbow: Secondary | ICD-10-CM

## 2013-11-05 DIAGNOSIS — Z760 Encounter for issue of repeat prescription: Secondary | ICD-10-CM

## 2013-11-05 DIAGNOSIS — M25569 Pain in unspecified knee: Secondary | ICD-10-CM

## 2013-11-05 MED ORDER — SILDENAFIL CITRATE 50 MG PO TABS: 50.0000 mg | ORAL_TABLET | Freq: Every day | ORAL | Status: DC | PRN

## 2013-11-05 MED ORDER — LORAZEPAM 1 MG PO TABS: ORAL_TABLET | ORAL | Status: DC

## 2013-11-05 NOTE — Patient Instructions (Signed)
I have placed an order for referral to Dr. Lara Mulch for evaluation of right knee pain. Continue to wear knee sleeve and take Prednisone for a few more days. Try to avoid over-stressing knees. For your elbow pain, you can use an elbow strap and apply a topical analgesic (like Tiger Balm).

## 2013-11-05 NOTE — Progress Notes (Signed)
S:  This 58 y.o. Cauc male has HTN, chronic anxiety and headache disorder, controlled w/ current medications. Medications are well tolerated w/o mention of adverse effects. No mention of fatigue, diaphoresis, vision disturbances, CP or tightness, palpitations, SOB or DOE, HA, dizziness, numbness or weakness. He has used Viagra w/o adverse effects.  Pt has Rheumatoid arthritis treated by Dr. Charlestine Night. Today, pt reports that he injured his knee in January 2015; while kneeling, he felt a "pop" followed by stiffness. No noticeable fluid around the joint. Dr Charlestine Night examined the knee and diagnosed " minor strain". Pt has been doing some exercises and developed swelling behind the knee a few days ago. Icing has helped; he has Prednisone at home and took 20 mg tablet x 2 doses. He wore an old knee brace which was uncomfortable; a copper-infused sleeve feels better. Pt has hx of meniscal tear > 10 years ago; no orthopedic evaluation or intervention at that time.  Patient Active Problem List   Diagnosis Date Noted  . Hemorrhoids, thrombosed 02/06/2013  . Bleeding internal hemorrhoids 02/06/2013  . Type II or unspecified type diabetes mellitus without mention of complication, not stated as uncontrolled 12/26/2011  . Hypogonadism male 12/26/2011  . Vitamin d deficiency 12/26/2011  . Rheumatoid arthritis 12/26/2011  . Migraine headache disorder 12/26/2011  . Anxiety disorder 12/26/2011  . HTN (hypertension), benign 12/26/2011  . Nephrolithiasis 12/26/2011  . Duodenitis- diagnosed and treated in 2011 12/26/2011    Prior to Admission medications   Medication Sig Start Date End Date Taking? Authorizing Provider  calcium-vitamin D (OSCAL WITH D) 500-200 MG-UNIT per tablet Take 1 tablet by mouth daily.   Yes Historical Provider, MD  cyclobenzaprine (FLEXERIL) 10 MG tablet Take 10 mg by mouth 3 (three) times daily as needed.   Yes Historical Provider, MD  folic acid (FOLVITE) 1 MG tablet Take 1 mg by mouth  daily.   Yes Historical Provider, MD  gabapentin (NEURONTIN) 600 MG tablet Take 1,200 mg by mouth 3 (three) times daily.   Yes Historical Provider, MD  hydroxychloroquine (PLAQUENIL) 200 MG tablet Take 200 mg by mouth 2 (two) times daily.   Yes Historical Provider, MD  imipramine (TOFRANIL) 25 MG tablet Take 25 mg by mouth at bedtime.   Yes Historical Provider, MD  lisinopril (PRINIVIL,ZESTRIL) 10 MG tablet TAKE ONE TABLET BY MOUTH ONCE DAILY 05/31/13  Yes Heather M Marte, PA-C  LORazepam (ATIVAN) 1 MG tablet TAKE ONE-HALF TO ONE TABLET BY MOUTH EVERY 8 HOURS AS NEEDED FOR ANXIETY   Yes Barton Fanny, MD  methotrexate (RHEUMATREX) 2.5 MG tablet Take 7.5 mg by mouth 3 (three) times a week.   Yes Historical Provider, MD  OVER THE COUNTER MEDICATION OTC Vitamin D3  2000 iu taking   Yes Historical Provider, MD  polyethylene glycol powder (GLYCOLAX/MIRALAX) powder Use as directed 10/12/11  Yes Hayden Rasmussen, MD  ranitidine (ZANTAC) 150 MG tablet Take 150 mg by mouth 2 (two) times daily.   Yes Historical Provider, MD  sildenafil (VIAGRA) 50 MG tablet Take 1 tablet (50 mg total) by mouth daily as needed for erectile dysfunction.   Yes Barton Fanny, MD  sulfaSALAzine (AZULFIDINE) 500 MG tablet Take 500 mg by mouth 3 (three) times daily.    Yes Historical Provider, MD   PMHx, Surg Hx, Soc and Fam Hx reviewed.  ROS: As per HPI.  O: Filed Vitals:   11/05/13 1036  BP: 144/80  Pulse: 87  Temp: 98.4 F (36.9 C)  Resp: 16   GEN: In NAD; WN,WD. HENT: [place]/AT; EOMI w/ clear conj/sclerae. Otherwise unremarkable. COR: RRR. LUNGS: Unlabored resp. MS: R knee- pt wearing copper-infused sleeve; normal appearance w/o erythema, effusion or deformity. Good ROM but restricted at extreme of flexion/extension. Fullness in popliteal space. Minimal bony tenderness at joint line; + patella compression test. [place]ray test negative. No ligament laxity. NEURO: A&O x 3; CNs intact. Gait normal.  Nonfocal.  [facility] reading (PRIMARY) by  Dr. Leward Quan: R knee- Well preserved joint space; no fracture or dislocation.   A/P: Pain in right knee - Continue current treatment. May take Prednisone for few more days. Use topical analgesic. Refer to [facility] given hx of previous meniscal tear. Plan: DG Knee 1-2 Views Right  Elbow pain, left- Tendonitis; treat conservatively.  Issue of repeat prescriptions  Meds ordered this encounter  Medications  . LORazepam (ATIVAN) 1 MG tablet    Sig: TAKE ONE-HALF TO ONE TABLET BY MOUTH EVERY 8 HOURS AS NEEDED FOR ANXIETY    Dispense:  45 tablet    Refill:  1  . sildenafil (VIAGRA) 50 MG tablet    Sig: Take 1 tablet (50 mg total) by mouth daily as needed for erectile dysfunction.    Dispense:  9 tablet    Refill:  3  . imipramine (TOFRANIL) 25 MG tablet    Sig: Take 25 mg by mouth at bedtime.

## 2014-01-07 ENCOUNTER — Other Ambulatory Visit: Payer: Self-pay | Admitting: Family Medicine

## 2014-01-08 NOTE — Telephone Encounter (Signed)
Lorazepam refill phoned to pt's pharmacy. 

## 2014-03-06 ENCOUNTER — Other Ambulatory Visit: Payer: Self-pay | Admitting: Family Medicine

## 2014-03-10 NOTE — Telephone Encounter (Signed)
Lorazepam refill phoned to pt's pharmacy. 

## 2014-04-29 ENCOUNTER — Ambulatory Visit (INDEPENDENT_AMBULATORY_CARE_PROVIDER_SITE_OTHER): Payer: BC Managed Care – PPO

## 2014-04-29 ENCOUNTER — Ambulatory Visit (INDEPENDENT_AMBULATORY_CARE_PROVIDER_SITE_OTHER): Payer: BC Managed Care – PPO | Admitting: Family Medicine

## 2014-04-29 VITALS — BP 110/68 | HR 97 | Temp 97.8°F | Resp 18 | Ht 66.0 in | Wt 155.0 lb

## 2014-04-29 DIAGNOSIS — R05 Cough: Secondary | ICD-10-CM

## 2014-04-29 DIAGNOSIS — R059 Cough, unspecified: Secondary | ICD-10-CM

## 2014-04-29 DIAGNOSIS — R062 Wheezing: Secondary | ICD-10-CM

## 2014-04-29 DIAGNOSIS — J22 Unspecified acute lower respiratory infection: Secondary | ICD-10-CM

## 2014-04-29 DIAGNOSIS — J988 Other specified respiratory disorders: Secondary | ICD-10-CM

## 2014-04-29 DIAGNOSIS — M069 Rheumatoid arthritis, unspecified: Secondary | ICD-10-CM

## 2014-04-29 DIAGNOSIS — I7300 Raynaud's syndrome without gangrene: Secondary | ICD-10-CM

## 2014-04-29 MED ORDER — IPRATROPIUM BROMIDE 0.02 % IN SOLN
0.5000 mg | Freq: Once | RESPIRATORY_TRACT | Status: AC
  Administered 2014-04-29: 0.5 mg via RESPIRATORY_TRACT

## 2014-04-29 MED ORDER — ALBUTEROL SULFATE HFA 108 (90 BASE) MCG/ACT IN AERS: 2.0000 | INHALATION_SPRAY | Freq: Four times a day (QID) | RESPIRATORY_TRACT | Status: DC | PRN

## 2014-04-29 MED ORDER — BENZONATATE 100 MG PO CAPS: 200.0000 mg | ORAL_CAPSULE | Freq: Two times a day (BID) | ORAL | Status: DC | PRN

## 2014-04-29 MED ORDER — AZITHROMYCIN 250 MG PO TABS: ORAL_TABLET | ORAL | Status: DC

## 2014-04-29 MED ORDER — HYDROCODONE-HOMATROPINE 5-1.5 MG/5ML PO SYRP: 5.0000 mL | ORAL_SOLUTION | Freq: Every evening | ORAL | Status: DC | PRN

## 2014-04-29 MED ORDER — ALBUTEROL SULFATE (2.5 MG/3ML) 0.083% IN NEBU
2.5000 mg | INHALATION_SOLUTION | Freq: Once | RESPIRATORY_TRACT | Status: AC
  Administered 2014-04-29: 2.5 mg via RESPIRATORY_TRACT

## 2014-04-29 NOTE — Progress Notes (Signed)
Chief Complaint:  Chief Complaint  Patient presents with  . Cough    at least a week   . Generalized Body Aches  . Fever    off and on a few times     HPI: John Mcclain is a 58 y.o. male who is here for  URI sxs cough but primarily non productive cough,  No SOB, wheezeing but he hcas couging when he takes deep breaths , laughing makes him couhg He has tried robitussin DM and has helped a little. Has RA and is on plaquenil and methotrexate Last fever was subjective, felt hot yesterday. No n/v/abd/but has had some pleuritic CP  Past Medical History  Diagnosis Date  . Arthritis   . Depression   . Diabetes mellitus without complication   . Anxiety   . Heart murmur   . Hypertension   . Chronic migraine   . Raynaud's disease   . Kidney stones    Past Surgical History  Procedure Laterality Date  . Colon surgery    . Vasectomy    . Lymph node dissection      removed lymph node   History   Social History  . Marital Status: Married    Spouse Name: N/A    Number of Children: N/A  . Years of Education: N/A   Social History Main Topics  . Smoking status: Never Smoker   . Smokeless tobacco: Never Used  . Alcohol Use: Yes     Comment: 1/month  . Drug Use: No  . Sexual Activity:    Partners: Female   Other Topics Concern  . None   Social History Narrative   Married. Education: [employer].    Family History  Problem Relation Age of Onset  . Kidney disease Mother   . Hypertension Mother   . Arthritis Mother     RA  . Heart attack Father   . Migraines Brother   . Hypertension Brother   . Stroke Maternal Grandfather   . Alzheimer's disease Paternal Grandmother    Allergies  Allergen Reactions  . Penicillins    Prior to Admission medications   Medication Sig Start Date End Date Taking? Authorizing Provider  calcium-vitamin D (OSCAL WITH D) 500-200 MG-UNIT per tablet Take 1 tablet by mouth daily.   Yes Historical Provider, MD  cyclobenzaprine (FLEXERIL)  10 MG tablet Take 10 mg by mouth 3 (three) times daily as needed.   Yes Historical Provider, MD  Dextromethorphan-Guaifenesin (TUSSIN DM PO) Take by mouth.   Yes Historical Provider, MD  etanercept (ENBREL) 50 MG/ML injection Inject 50 mg into the skin once a week. Every Monday   Yes Historical Provider, MD  folic acid (FOLVITE) 1 MG tablet Take 1 mg by mouth daily.   Yes Historical Provider, MD  gabapentin (NEURONTIN) 600 MG tablet Take 1,200 mg by mouth 3 (three) times daily.   Yes Historical Provider, MD  imipramine (TOFRANIL) 25 MG tablet Take 25 mg by mouth 3 (three) times daily.    Yes Historical Provider, MD  lisinopril (PRINIVIL,ZESTRIL) 10 MG tablet TAKE ONE TABLET BY MOUTH ONCE DAILY 05/31/13  Yes Heather M Marte, PA-C  LORazepam (ATIVAN) 1 MG tablet TAKE ONE-HALF TO ONE TABLET BY MOUTH EVERY 8 HOURS AS NEEDED FOR ANXIETY 03/10/14  Yes Barton Fanny, MD  methotrexate (RHEUMATREX) 2.5 MG tablet Take 7.5 mg by mouth 3 (three) times a week.   Yes Historical Provider, MD  OVER THE COUNTER MEDICATION OTC Vitamin  D3  2000 iu taking   Yes Historical Provider, MD  polyethylene glycol powder (GLYCOLAX/MIRALAX) powder Use as directed 10/12/11  Yes Hayden Rasmussen, MD  ranitidine (ZANTAC) 150 MG tablet Take 150 mg by mouth 2 (two) times daily.   Yes Historical Provider, MD  sildenafil (VIAGRA) 50 MG tablet Take 1 tablet (50 mg total) by mouth daily as needed for erectile dysfunction. 11/05/13  Yes Barton Fanny, MD  hydroxychloroquine (PLAQUENIL) 200 MG tablet Take 200 mg by mouth 2 (two) times daily.    Historical Provider, MD  sulfaSALAzine (AZULFIDINE) 500 MG tablet Take 500 mg by mouth 3 (three) times daily.     Historical Provider, MD    ROS: The patient deniesnight sweats, unintentional weight loss, chest pain, palpitations, nausea, vomiting, abdominal pain, dysuria, hematuria, melena, numbness, weakness, or tingling.   All other systems have been reviewed and were otherwise negative  with the exception of those mentioned in the HPI and as above.    PHYSICAL EXAM: Filed Vitals:   04/29/14 0833  BP: 110/68  Pulse: 97  Temp: 97.8 F (36.6 C)  Resp: 18   Filed Vitals:   04/29/14 0833  Height: 5\' 6"  (1.676 m)  Weight: 155 lb (70.308 kg)   Body mass index is 25.03 kg/(m^2).  General: Alert, no acute distress HEENT:  Normocephalic, atraumatic, oropharynx patent. EOMI, PERRLA Cardiovascular:  Regular rate and rhythm, no rubs murmurs or gallops.  No Carotid bruits, radial pulse intact. No pedal edema.  Respiratory: Clear to auscultation bilaterally.  No wheezes, rales, or rhonchi.  No cyanosis, no use of accessory musculature GI: No organomegaly, abdomen is soft and non-tender, positive bowel sounds.  No masses. Skin: No rashes. Neurologic: Facial musculature symmetric. Psychiatric: Patient is appropriate throughout our interaction. Lymphatic: No cervical lymphadenopathy Musculoskeletal: Gait intact.   LABS: Results for orders placed or performed in visit on 02/54/27  Basic metabolic panel  Result Value Ref Range   Sodium 138 135 - 145 mEq/L   Potassium 4.8 3.5 - 5.3 mEq/L   Chloride 102 96 - 112 mEq/L   CO2 27 19 - 32 mEq/L   Glucose, Bld 87 70 - 99 mg/dL   BUN 13 6 - 23 mg/dL   Creat 0.95 0.50 - 1.35 mg/dL   Calcium 9.6 8.4 - 10.5 mg/dL  Testosterone, free, total  Result Value Ref Range   Testosterone 319 300 - 890 ng/dL   Sex Hormone Binding 44 13 - 71 nmol/L   Testosterone, Free 52.6 47.0 - 244.0 pg/mL   Testosterone-% Free 1.6 1.6 - 2.9 %     EKG/XRAY:   Primary read interpreted by Dr. Marin Comment at [facility]. Please comment if there is a right lower lobe infiltrate or just increase vasc markings No effusion, no pneumo   ASSESSMENT/PLAN: Encounter Diagnoses  Name Primary?  . Cough   . Wheezing   . Lower respiratory infection Yes  . Raynaud's disease   . Rheumatoid arthritis    Rx Azithromycin, hycodan,tessalon perles Await official chest  xray F/u prn    Gross sideeffects, risk and benefits, and alternatives of medications d/w patient. Patient is aware that all medications have potential sideeffects and we are unable to predict every sideeffect or drug-drug interaction that may occur.  Ambermarie Honeyman, [place], DO 04/29/2014 11:08 AM    05/02/14-spoke with patient about official chest xray results, he has PNA, will need repeat cxr in 4 weeks.  Has fever blisters, has had them before, will try vlatrex  but I am limiting it since he  Is on a lot of meds.

## 2014-05-02 MED ORDER — VALACYCLOVIR HCL 1 G PO TABS: ORAL_TABLET | ORAL | Status: DC

## 2014-05-13 ENCOUNTER — Encounter: Payer: Self-pay | Admitting: Family Medicine

## 2014-05-13 ENCOUNTER — Ambulatory Visit (INDEPENDENT_AMBULATORY_CARE_PROVIDER_SITE_OTHER): Payer: BC Managed Care – PPO | Admitting: Family Medicine

## 2014-05-13 VITALS — BP 130/84 | HR 75 | Temp 98.5°F | Resp 16 | Ht 65.5 in | Wt 148.0 lb

## 2014-05-13 DIAGNOSIS — Z0000 Encounter for general adult medical examination without abnormal findings: Secondary | ICD-10-CM

## 2014-05-13 DIAGNOSIS — Z125 Encounter for screening for malignant neoplasm of prostate: Secondary | ICD-10-CM

## 2014-05-13 DIAGNOSIS — K121 Other forms of stomatitis: Secondary | ICD-10-CM

## 2014-05-13 DIAGNOSIS — Z13220 Encounter for screening for lipoid disorders: Secondary | ICD-10-CM

## 2014-05-13 DIAGNOSIS — Z79899 Other long term (current) drug therapy: Secondary | ICD-10-CM

## 2014-05-13 DIAGNOSIS — R252 Cramp and spasm: Secondary | ICD-10-CM

## 2014-05-13 DIAGNOSIS — I10 Essential (primary) hypertension: Secondary | ICD-10-CM

## 2014-05-13 LAB — CBC WITH DIFFERENTIAL/PLATELET
Basophils Absolute: 0.1 10*3/uL (ref 0.0–0.1)
Basophils Relative: 1 % (ref 0–1)
Eosinophils Absolute: 0.2 10*3/uL (ref 0.0–0.7)
Eosinophils Relative: 3 % (ref 0–5)
HCT: 40.1 % (ref 39.0–52.0)
Hemoglobin: 14 g/dL (ref 13.0–17.0)
Lymphocytes Relative: 22 % (ref 12–46)
Lymphs Abs: 1.3 10*3/uL (ref 0.7–4.0)
MCH: 33.2 pg (ref 26.0–34.0)
MCHC: 34.9 g/dL (ref 30.0–36.0)
MCV: 95 fL (ref 78.0–100.0)
MPV: 8.5 fL — ABNORMAL LOW (ref 9.4–12.4)
Monocytes Absolute: 0.7 10*3/uL (ref 0.1–1.0)
Monocytes Relative: 13 % — ABNORMAL HIGH (ref 3–12)
Neutro Abs: 3.5 10*3/uL (ref 1.7–7.7)
Neutrophils Relative %: 61 % (ref 43–77)
Platelets: 344 10*3/uL (ref 150–400)
RBC: 4.22 MIL/uL (ref 4.22–5.81)
RDW: 15.3 % (ref 11.5–15.5)
WBC: 5.7 10*3/uL (ref 4.0–10.5)

## 2014-05-13 LAB — LIPID PANEL
Cholesterol: 212 mg/dL — ABNORMAL HIGH (ref 0–200)
HDL: 38 mg/dL — ABNORMAL LOW (ref >39)
LDL Cholesterol: 136 mg/dL — ABNORMAL HIGH (ref 0–99)
Total CHOL/HDL Ratio: 5.6 Ratio
Triglycerides: 189 mg/dL — ABNORMAL HIGH (ref <150)
VLDL: 38 mg/dL (ref 0–40)

## 2014-05-13 LAB — COMPLETE METABOLIC PANEL WITH GFR
ALT: 54 U/L — ABNORMAL HIGH (ref 0–53)
AST: 51 U/L — ABNORMAL HIGH (ref 0–37)
Albumin: 4.1 g/dL (ref 3.5–5.2)
Alkaline Phosphatase: 59 U/L (ref 39–117)
BUN: 13 mg/dL (ref 6–23)
CO2: 31 mEq/L (ref 19–32)
Calcium: 9.6 mg/dL (ref 8.4–10.5)
Chloride: 102 mEq/L (ref 96–112)
Creat: 1.09 mg/dL (ref 0.50–1.35)
GFR, Est African American: 85 mL/min
GFR, Est Non African American: 73 mL/min
Glucose, Bld: 101 mg/dL — ABNORMAL HIGH (ref 70–99)
Potassium: 4.8 mEq/L (ref 3.5–5.3)
Sodium: 141 mEq/L (ref 135–145)
Total Bilirubin: 0.6 mg/dL (ref 0.2–1.2)
Total Protein: 6.6 g/dL (ref 6.0–8.3)

## 2014-05-13 LAB — POCT URINALYSIS DIPSTICK
Bilirubin, UA: NEGATIVE
Blood, UA: NEGATIVE
Glucose, UA: NEGATIVE
Ketones, UA: NEGATIVE
Leukocytes, UA: NEGATIVE
Nitrite, UA: NEGATIVE
Protein, UA: NEGATIVE
Spec Grav, UA: 1.015
Urobilinogen, UA: 0.2
pH, UA: 6.5

## 2014-05-13 LAB — MAGNESIUM: Magnesium: 1.9 mg/dL (ref 1.5–2.5)

## 2014-05-13 MED ORDER — LORAZEPAM 1 MG PO TABS: ORAL_TABLET | ORAL | Status: DC

## 2014-05-13 MED ORDER — LISINOPRIL 10 MG PO TABS: 10.0000 mg | ORAL_TABLET | Freq: Every day | ORAL | Status: DC

## 2014-05-13 MED ORDER — VALACYCLOVIR HCL 1 G PO TABS: ORAL_TABLET | ORAL | Status: DC

## 2014-05-13 NOTE — Progress Notes (Signed)
Subjective:    Patient ID: John Mcclain, male    DOB: 02-06-1956, 58 y.o.   MRN: [id number]  HPI  This 58.y.o. 15 male is here for annual physical exam. He has Rheumatoid arthritis, treated by Dr. Charlestine Night. Recent medication change from Plaquenil to Enbrel. Pt requesting monitoring labs (CBC and CMET) per Dr. Elmon Else office.   Pt also recovering from pneumonia diagnosed on 04/29/14. He completed Zithromax and has minimal symptoms. He will need a follow-up CXR in 4-6 weeks. He c/o oral ulcers; does have a hx of "cold sores" in remote past. Magic mouthwash prescribed by dentitt provided some relief as did valacyclovir prescribed by Dr. Marin Comment. Topical OTC Abreva helps dry lesion on lower lip by ut then ulceration becomes raw again. Pt has been afebrile and denies rash, difficulty swallowing, increased cough or SOB, myalgias or weakness. He reports a small R pectoral lymph node.   Pt has hx of diet- controlled DM; he has been off oral medication (Metformin) since Aug 2014. Blood sugars are stable; pt checks FSBS every few weeks.   Pt continues to see Dr. Orie Rout for treatment of chronic HA disorder.  HCM: CRS- Current (2011 w/ 10-28yr recall); pt has chronic hemorrhoids.           IMM- Current; declines Flu vaccine (has never taken it). Considering it but is not fully                           recovered from pneumonia. Will discuss Zostavax at next visit.           Vision- Annually.  Patient Active Problem List   Diagnosis Date Noted  . Hemorrhoids, thrombosed 02/06/2013  . Bleeding internal hemorrhoids 02/06/2013  . Type II or unspecified type diabetes mellitus without mention of complication, not stated as uncontrolled 12/26/2011  . Hypogonadism male 12/26/2011  . Vitamin d deficiency 12/26/2011  . Rheumatoid arthritis 12/26/2011  . Migraine headache disorder 12/26/2011  . Anxiety disorder 12/26/2011  . HTN (hypertension), benign 12/26/2011  . Nephrolithiasis 12/26/2011  .  Duodenitis- diagnosed and treated in 2011 12/26/2011    Prior to Admission medications   Medication Sig Start Date End Date Taking? Authorizing Provider  albuterol (PROVENTIL HFA;VENTOLIN HFA) 108 (90 BASE) MCG/ACT inhaler Inhale 2 puffs into the lungs every 6 (six) hours as needed for wheezing or shortness of breath. 04/29/14  Yes Thao P Le, DO  calcium-vitamin D (OSCAL WITH D) 500-200 MG-UNIT per tablet Take 1 tablet by mouth daily.   Yes Historical Provider, MD  cyclobenzaprine (FLEXERIL) 10 MG tablet Take 10 mg by mouth 3 (three) times daily as needed.   Yes Historical Provider, MD  etanercept (ENBREL) 50 MG/ML injection Inject 50 mg into the skin once a week. Every Monday   Yes Historical Provider, MD  folic acid (FOLVITE) 1 MG tablet Take 1 mg by mouth daily.   Yes Historical Provider, MD  gabapentin (NEURONTIN) 600 MG tablet Take 1,200 mg by mouth 3 (three) times daily.   Yes Historical Provider, MD  imipramine (TOFRANIL) 25 MG tablet Take 25 mg by mouth 3 (three) times daily.    Yes Historical Provider, MD  lisinopril (PRINIVIL,ZESTRIL) 10 MG tablet Take 1 tablet (10 mg total) by mouth daily.   Yes Barton Fanny, MD  LORazepam (ATIVAN) 1 MG tablet TAKE ONE-HALF TO ONE TABLET BY MOUTH EVERY 8 HOURS AS NEEDED FOR ANXIETY   Yes Barton Fanny,  MD  methotrexate (RHEUMATREX) 2.5 MG tablet Take 7.5 mg by mouth 3 (three) times a week.   Yes Historical Provider, MD  OVER THE COUNTER MEDICATION OTC Vitamin D3  2000 iu taking   Yes Historical Provider, MD  polyethylene glycol powder (GLYCOLAX/MIRALAX) powder Use as directed 10/12/11  Yes Hayden Rasmussen, MD  ranitidine (ZANTAC) 150 MG tablet Take 150 mg by mouth 2 (two) times daily.   Yes Historical Provider, MD  sildenafil (VIAGRA) 50 MG tablet Take 1 tablet (50 mg total) by mouth daily as needed for erectile dysfunction. 11/05/13  Yes Barton Fanny, MD  valACYclovir (VALTREX) 1000 MG tablet Take 1 tab PO 12 hours apart x 2 doses, may  repeat prn in  3 days if no improvement   No    History   Social History  . Marital Status: Married    Spouse Name: N/A    Number of Children: N/A  . Years of Education: N/A   Occupational History  . Not on file.   Social History Main Topics  . Smoking status: Never Smoker   . Smokeless tobacco: Never Used  . Alcohol Use: Yes     Comment: 2-3 per week  . Drug Use: No  . Sexual Activity:    Partners: Female   Other Topics Concern  . Not on file   Social History Narrative   Married. Education: [employer].     Family History  Problem Relation Age of Onset  . Kidney disease Mother   . Hypertension Mother   . Arthritis Mother     RA  . COPD Mother   . Hyperlipidemia Mother   . Heart attack Father   . Diabetes Father   . COPD Father   . Hypertension Father   . Migraines Brother   . Stroke Maternal Grandfather   . Alzheimer's disease Paternal Grandmother            Review of Systems  Constitutional: Negative.   HENT: Positive for mouth sores and sore throat.        Onset blisters in Oct 2015; dentist prescribed  Magic Mouthwash. Blister on inside bottom lip; OTC Abreva helps dry top layer then it peels off. Valtrex has helped.  Eyes: Positive for photophobia and redness.       Chronic.Has eye care eval every 12- 15 months.  Respiratory: Negative.   Cardiovascular: Negative.   Gastrointestinal: Positive for anal bleeding.  Endocrine: Negative.   Genitourinary: Positive for urgency.       Has occasional dribbling, nocturia x 1 and "stop and start" voiding.  Musculoskeletal: Positive for back pain, arthralgias and neck pain.       Leg and foot cramps; taking OTC magnesium ~400 mg bid.   Skin: Negative.   Allergic/Immunologic: Positive for environmental allergies and immunocompromised state.  Neurological: Positive for headaches.  Hematological: Positive for adenopathy.  Psychiatric/Behavioral: Negative.        Objective:   Physical Exam  Constitutional: He is  oriented to person, place, and time. Vital signs are normal. He appears well-developed and well-nourished. No distress.  HENT:  Head: Normocephalic and atraumatic.  Right Ear: Hearing, tympanic membrane, external ear and ear canal normal.  Left Ear: Hearing, tympanic membrane, external ear and ear canal normal.  Nose: Nose normal. No nasal deformity or septal deviation.  Mouth/Throat: Uvula is midline and mucous membranes are normal. Oral lesions present. No uvula swelling or dental caries. Posterior oropharyngeal erythema present. No oropharyngeal exudate.  Lower lip- inner aspect w/ shallow ulcer w/ white coating.  Eyes: Conjunctivae, EOM and lids are normal. Pupils are equal, round, and reactive to light. No scleral icterus.  Neck: Trachea normal, normal range of motion, full passive range of motion without pain and phonation normal. Neck supple. No JVD present. No spinous process tenderness and no muscular tenderness present. Carotid bruit is not present. No thyroid mass and no thyromegaly present.  Cardiovascular: Normal rate, regular rhythm, S1 normal, S2 normal, normal heart sounds, intact distal pulses and normal pulses.   No extrasystoles are present. PMI is not displaced.  Exam reveals no gallop and no friction rub.   No murmur heard. Pulmonary/Chest: Effort normal and breath sounds normal. No respiratory distress. He has no decreased breath sounds. He has no wheezes. He has no rhonchi.  Abdominal: Soft. Normal appearance and bowel sounds are normal. He exhibits no distension, no abdominal bruit, no pulsatile midline mass and no mass. There is no hepatosplenomegaly. There is no tenderness. There is no guarding and no CVA tenderness.  Genitourinary: Rectum normal and prostate normal. Rectal exam shows no external hemorrhoid, no fissure, no mass, no tenderness and anal tone normal.  Musculoskeletal:       Right wrist: He exhibits decreased range of motion, bony tenderness and deformity.        Right knee: He exhibits decreased range of motion and deformity. He exhibits no effusion, no erythema, normal alignment and no bony tenderness. Tenderness found. Medial joint line tenderness noted. No patellar tendon tenderness noted.       Thoracic back: Normal.       Lumbar back: Normal.  Remainder of exam unremarkable.  Lymphadenopathy:       Head (right side): No submental, no submandibular, no tonsillar, no preauricular, no posterior auricular and no occipital adenopathy present.       Head (left side): No submental, no submandibular, no tonsillar, no preauricular, no posterior auricular and no occipital adenopathy present.    He has no cervical adenopathy.    He has axillary adenopathy.       Right axillary: Pectoral adenopathy present. No lateral adenopathy present.       Left axillary: No pectoral and no lateral adenopathy present.      Right: No inguinal, no supraclavicular and no epitrochlear adenopathy present.       Left: No inguinal, no supraclavicular and no epitrochlear adenopathy present.  Neurological: He is alert and oriented to person, place, and time. He has normal strength and normal reflexes. He displays no atrophy. No cranial nerve deficit or sensory deficit. He exhibits normal muscle tone. He displays a negative Romberg sign. Coordination and gait normal.  Skin: Skin is warm, dry and intact. No ecchymosis, no lesion and no rash noted. He is not diaphoretic. There is erythema. No cyanosis. No pallor. Nails show no clubbing.  Tattoos on back. Mild erythema of R wrist and R knee as well as hand digits.  Psychiatric: His speech is normal and behavior is normal. Judgment and thought content normal. His mood appears anxious. His affect is not blunt and not inappropriate. Cognition and memory are normal. He does not exhibit a depressed mood.  Nursing note and vitals reviewed.      Assessment & Plan:  Encounter for routine history and physical exam for male  Laboratory  examination ordered as part of a routine general medical examination  Oral ulcer- Suspect Aphthous ulcer; may continue Abreva and refill valacyclovir.  Cramp of both  lower extremities - Plan: POCT urinalysis dipstick, Magnesium, Vitamin D, 25-hydroxy, CANCELED: Vit D  25 hydroxy (rtn osteoporosis monitoring)  HTN (hypertension), benign - Stable on lisinopril 10 mg 1 tablet daily; continue same.  Plan: CBC with Differential, COMPLETE METABOLIC PANEL WITH GFR  Long term use of drug - Plan: CBC with Differential, COMPLETE METABOLIC PANEL WITH GFR  Screening for lipid disorders - Plan: Lipid panel  Screening for prostate cancer - Plan: PSA   Meds ordered this encounter  Medications  . lisinopril (PRINIVIL,ZESTRIL) 10 MG tablet    Sig: Take 1 tablet (10 mg total) by mouth daily.    Dispense:  90 tablet    Refill:  3  . valACYclovir (VALTREX) 1000 MG tablet    Sig: Take 1 tab PO 12 hours apart x 2 doses, may repeat prn in  3 days if no improvement    Dispense:  6 tablet    Refill:  0  . LORazepam (ATIVAN) 1 MG tablet    Sig: TAKE ONE-HALF TO ONE TABLET BY MOUTH EVERY 8 HOURS AS NEEDED FOR ANXIETY    Dispense:  45 tablet    Refill:  1

## 2014-05-13 NOTE — Patient Instructions (Signed)

## 2014-05-14 LAB — VITAMIN D 25 HYDROXY (VIT D DEFICIENCY, FRACTURES): Vit D, 25-Hydroxy: 37 ng/mL (ref 30–100)

## 2014-05-14 LAB — PSA: PSA: 0.71 ng/mL (ref <=4.00)

## 2014-05-18 NOTE — Progress Notes (Signed)
Quick Note:  Please advise pt regarding following labs... Vitamin D level is in low-normal range. Need to increase Oscal + D to twice a day. Prostate blood test is normal. Blood counts are normal. Magnesium level is normal.  Blood sugar, sodium, potassium, chloride, calcium and kidney function are normal. Liver function numbers are slightly above normal; this may be related to medications taken for chronic disease.  Total and LDL ("bad") cholesterol are above normal; triglycerides are elevated also. HDL ("good") cholesterol is less than 2 years ago and is below the normal cut-off.  Focus on healthy diet The MEDITERRANEAN DIET is a great guide for nutritious eating. Regular exercise helps increase HDL while lowering total and LDL cholesterol numbers.  Copy to pt. ______

## 2014-05-27 ENCOUNTER — Ambulatory Visit (INDEPENDENT_AMBULATORY_CARE_PROVIDER_SITE_OTHER): Payer: BC Managed Care – PPO | Admitting: Family Medicine

## 2014-05-27 ENCOUNTER — Encounter: Payer: Self-pay | Admitting: Family Medicine

## 2014-05-27 ENCOUNTER — Ambulatory Visit (INDEPENDENT_AMBULATORY_CARE_PROVIDER_SITE_OTHER): Payer: BC Managed Care – PPO

## 2014-05-27 VITALS — BP 114/75 | HR 84 | Temp 98.1°F | Resp 16 | Ht 65.5 in | Wt 150.0 lb

## 2014-05-27 DIAGNOSIS — D849 Immunodeficiency, unspecified: Secondary | ICD-10-CM

## 2014-05-27 DIAGNOSIS — Z8701 Personal history of pneumonia (recurrent): Secondary | ICD-10-CM

## 2014-05-27 DIAGNOSIS — D899 Disorder involving the immune mechanism, unspecified: Secondary | ICD-10-CM

## 2014-05-27 DIAGNOSIS — L02512 Cutaneous abscess of left hand: Secondary | ICD-10-CM

## 2014-05-27 DIAGNOSIS — Z23 Encounter for immunization: Secondary | ICD-10-CM

## 2014-05-27 MED ORDER — MUPIROCIN CALCIUM 2 % EX CREA: 1.0000 "application " | TOPICAL_CREAM | Freq: Two times a day (BID) | CUTANEOUS | Status: DC

## 2014-05-27 NOTE — Patient Instructions (Signed)
Finger infection- I am prescribing a topical antibiotic cream to be applied twice a day after soaking finger in warm water (add Epsom salts) for 5 minutes and drying skin thoroughly. If the area does not improve by next week, contact the clinic.

## 2014-05-27 NOTE — Progress Notes (Signed)
S:  This 58 y.o. Cauc male is here for CXR to check for resolution of pneumonia, diagnosed and treated on Apr 29, 2014. He feels well w/o fever/chills, fatigue, anorexia, cough, SOB, pleuritic chest pain, HA,dizziness or weakness.   He inquires about getting the Flu vaccine; he has never received this vaccine before. He has Rheumatoid arthritis and is on Enbrel. He also requests Shingles vaccine.  Pt has a lesion on L middle finger which is draining occasionally clear thick fluid. It is tender and red. After accidentally bumping area, it flared up and has remained swollen and red. The nail appears normal.  Patient Active Problem List   Diagnosis Date Noted  . Hemorrhoids, thrombosed 02/06/2013  . Bleeding internal hemorrhoids 02/06/2013  . Type II or unspecified type diabetes mellitus without mention of complication, not stated as uncontrolled 12/26/2011  . Hypogonadism male 12/26/2011  . Vitamin d deficiency 12/26/2011  . Rheumatoid arthritis 12/26/2011  . Migraine headache disorder 12/26/2011  . Anxiety disorder 12/26/2011  . HTN (hypertension), benign 12/26/2011  . Nephrolithiasis 12/26/2011  . Duodenitis- diagnosed and treated in 2011 12/26/2011    Prior to Admission medications   Medication Sig Start Date End Date Taking? Authorizing Provider  calcium-vitamin D (OSCAL WITH D) 500-200 MG-UNIT per tablet Take 1 tablet by mouth daily.   Yes Historical Provider, MD  cyclobenzaprine (FLEXERIL) 10 MG tablet Take 10 mg by mouth 3 (three) times daily as needed.   Yes Historical Provider, MD  etanercept (ENBREL) 50 MG/ML injection Inject 50 mg into the skin once a week. Every Monday   Yes Historical Provider, MD  folic acid (FOLVITE) 1 MG tablet Take 1 mg by mouth daily.   Yes Historical Provider, MD  gabapentin (NEURONTIN) 600 MG tablet Take 1,200 mg by mouth 3 (three) times daily.   Yes Historical Provider, MD  imipramine (TOFRANIL) 25 MG tablet Take 25 mg by mouth 3 (three) times daily.     Yes Historical Provider, MD  lisinopril (PRINIVIL,ZESTRIL) 10 MG tablet Take 1 tablet (10 mg total) by mouth daily. 05/13/14  Yes Barton Fanny, MD  LORazepam (ATIVAN) 1 MG tablet TAKE ONE-HALF TO ONE TABLET BY MOUTH EVERY 8 HOURS AS NEEDED FOR ANXIETY 05/13/14  Yes Barton Fanny, MD  methotrexate (RHEUMATREX) 2.5 MG tablet Take 7.5 mg by mouth 3 (three) times a week.   Yes Historical Provider, MD  OVER THE COUNTER MEDICATION OTC Vitamin D3  2000 iu taking   Yes Historical Provider, MD  polyethylene glycol powder (GLYCOLAX/MIRALAX) powder Use as directed 10/12/11  Yes Hayden Rasmussen, MD  ranitidine (ZANTAC) 150 MG tablet Take 150 mg by mouth 2 (two) times daily.   Yes Historical Provider, MD  sildenafil (VIAGRA) 50 MG tablet Take 1 tablet (50 mg total) by mouth daily as needed for erectile dysfunction. 11/05/13  Yes Barton Fanny, MD  albuterol (PROVENTIL HFA;VENTOLIN HFA) 108 (90 BASE) MCG/ACT inhaler Inhale 2 puffs into the lungs every 6 (six) hours as needed for wheezing or shortness of breath. Patient not taking: Reported on 05/27/2014 04/29/14   Thao P Le, DO  valACYclovir (VALTREX) 1000 MG tablet Take 1 tab PO 12 hours apart x 2 doses, may repeat prn in  3 days if no improvement Patient not taking: Reported on 05/27/2014 05/13/14   Barton Fanny, MD    ROS: As per HPI.  O: Filed Vitals:   05/27/14 0930  BP: 114/75  Pulse: 84  Temp: 98.1 F (36.7 C)  Resp: 16    GEN: In NAD; WN,WD. HENT: [place]/AT; EOMI w/ clear conj/sclerae, Ext ears/nose normal. Oroph unremarkable. COR: RRR. Normal S1 and S2. LUNGS: CTA; normal resp effort and resp rate. No wheezes or rhonchi. SKIN: W&D; intact. L middle finger- proximal to nail is an inflamed nodule w/ central opening (no active draining); fluctuant and slightly tender. NEURO: A/O x 3; CNs intact. Nonfocal.  [facility] reading (PRIMARY) by  Dr. Leward Quan: CXR- Clearing of areas of opacity in R and L lobes. No acute processes. Thoracic  spine with degenerative changes and spurring.   A/P: History of pneumonia - Completely resolved. Plan: DG Chest 2 View  Immunocompromised patient- Inactivated Flu vaccine given. Advised against Zostavax.  Felon, left- Soaking and topical antibiotic cream bid.  Need for prophylactic vaccination and inoculation against influenza - Plan: Flu Vaccine QUAD 36+ mos IM

## 2014-06-09 ENCOUNTER — Telehealth: Payer: Self-pay | Admitting: *Deleted

## 2014-06-09 NOTE — Telephone Encounter (Signed)
Dr. Elmon Else office is requesting most recent labs done by Dr. Leward Quan  Phone: [phone] Fax: [phone]

## 2014-06-11 NOTE — Telephone Encounter (Signed)
Labs faxed thru Epic this morning.

## 2014-06-30 DIAGNOSIS — Z0271 Encounter for disability determination: Secondary | ICD-10-CM

## 2014-08-24 ENCOUNTER — Other Ambulatory Visit: Payer: Self-pay | Admitting: Family Medicine

## 2014-08-25 NOTE — Telephone Encounter (Signed)
Lorazepam refill phoned to pt's pharmacy.

## 2014-10-23 ENCOUNTER — Ambulatory Visit (INDEPENDENT_AMBULATORY_CARE_PROVIDER_SITE_OTHER): Payer: 59 | Admitting: Family Medicine

## 2014-10-23 ENCOUNTER — Encounter: Payer: Self-pay | Admitting: Family Medicine

## 2014-10-23 VITALS — BP 148/90 | HR 101 | Temp 98.8°F | Resp 16 | Ht 66.0 in | Wt 156.0 lb

## 2014-10-23 DIAGNOSIS — B001 Herpesviral vesicular dermatitis: Secondary | ICD-10-CM

## 2014-10-23 DIAGNOSIS — K121 Other forms of stomatitis: Secondary | ICD-10-CM | POA: Diagnosis not present

## 2014-10-23 DIAGNOSIS — I10 Essential (primary) hypertension: Secondary | ICD-10-CM | POA: Diagnosis not present

## 2014-10-23 DIAGNOSIS — K5900 Constipation, unspecified: Secondary | ICD-10-CM | POA: Diagnosis not present

## 2014-10-23 MED ORDER — VALACYCLOVIR HCL 1 G PO TABS: ORAL_TABLET | ORAL | Status: DC

## 2014-10-23 MED ORDER — MAGIC MOUTHWASH: 5.0000 mL | Freq: Four times a day (QID) | ORAL | Status: DC

## 2014-10-23 MED ORDER — POLYETHYLENE GLYCOL 3350 17 GM/SCOOP PO POWD: 17.0000 g | Freq: Every day | ORAL | Status: DC

## 2014-10-27 NOTE — Progress Notes (Signed)
S;  This 59 y.o male has RA, treated by Dr. Charlestine Night. 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 He developed painful mouth ulcerations 2 weeks ago; Dr. Charlestine Night prescribed Acyclovir 400 mg 1 tab five times a day. Ulcers have not improved. Pt denies fever/ chills, sore throat, difficulty swallowing, cough, SOB, GI upset, rash, HA, dizziness or weakness. He is requesting different treatment.  Pt has umbilicial hernia and he is concerned that it has increased in size in last 3-4 months. He has occasional constipation, treated w/ OTC stool softeners and increased fiber/ Benefiber,etc. These products are somewhat effective. Umbilicus is not red, swoolen or painful.  Patient Active Problem List   Diagnosis Date Noted  . Hemorrhoids, thrombosed 02/06/2013  . Bleeding internal hemorrhoids 02/06/2013  . Type II or unspecified type diabetes mellitus without mention of complication, not stated as uncontrolled 12/26/2011  . Hypogonadism male 12/26/2011  . Vitamin D deficiency 12/26/2011  . Rheumatoid arthritis(714.0) 12/26/2011  . Migraine headache disorder 12/26/2011  . Anxiety disorder 12/26/2011  . HTN (hypertension), benign 12/26/2011  . Nephrolithiasis 12/26/2011  . Duodenitis- diagnosed and treated in 2011 12/26/2011    Prior to Admission medications   Medication Sig Start Date End Date Taking? Authorizing Provider  albuterol (PROVENTIL HFA;VENTOLIN HFA) 108 (90 BASE) MCG/ACT inhaler Inhale 2 puffs into the lungs every 6 (six) hours as needed for wheezing or shortness of breath. 04/29/14  Yes Thao P Le, DO  calcium-vitamin D (OSCAL WITH D) 500-200 MG-UNIT per tablet Take 1 tablet by mouth daily.   Yes Historical Provider, MD  clobetasol ointment (TEMOVATE) 1.61 % Apply 1 application topically 2 (two) times daily.   Yes Historical Provider, MD  cyclobenzaprine (FLEXERIL) 10 MG tablet  Take 10 mg by mouth 3 (three) times daily as needed.   Yes Historical Provider, MD  etanercept (ENBREL) 50 MG/ML injection Inject 50 mg into the skin once a week. Every Monday   Yes Historical Provider, MD  folic acid (FOLVITE) 1 MG tablet Take 1 mg by mouth daily.   Yes Historical Provider, MD  gabapentin (NEURONTIN) 600 MG tablet Take 1,200 mg by mouth 3 (three) times daily.   Yes Historical Provider, MD  imipramine (TOFRANIL) 25 MG tablet Take 25 mg by mouth 3 (three) times daily.    Yes Historical Provider, MD  lisinopril (PRINIVIL,ZESTRIL) 10 MG tablet Take 1 tablet (10 mg total) by mouth daily. 05/13/14  Yes Barton Fanny, MD  LORazepam (ATIVAN) 1 MG tablet TAKE ONE-HALF TO ONE TABLET BY MOUTH EVERY 8 HOURS AS NEEDED FOR ANXIETY 08/25/14  Yes Barton Fanny, MD  methotrexate (RHEUMATREX) 2.5 MG tablet Take 7.5 mg by mouth 3 (three) times a week.   Yes Historical Provider, MD  mupirocin cream (BACTROBAN) 2 % Apply 1 application topically 2 (two) times daily. 05/27/14  Yes Barton Fanny, MD  OVER THE COUNTER MEDICATION OTC Vitamin D3  2000 iu taking   Yes Historical Provider, MD  ranitidine (ZANTAC) 150 MG tablet Take 150 mg by mouth 2 (two) times daily.   Yes Historical Provider, MD  sildenafil (VIAGRA) 50 MG tablet Take 1 tablet (50 mg total) by mouth daily as needed for erectile dysfunction. 11/05/13  Yes Barton Fanny, MD    SURG, SOC and FAM HX reviewed.  ROS: As per HPI.  O: Filed Vitals:  10/23/14 1552  BP: 148/90  Pulse:   Temp:   Resp:    GEN: In NAD; WN,WD. HENT: [place]/AT. EOMI w/ clear conj/sclerae. Oral buccal mucosa w/ thick white plaques and shallow ulcerations inside lower lip. Tongue normal. NECK: Supple w/o LAN. COR: RRR. LUNGS: Unlabored resp. ABD: Umbilicus- normal with NT fingertip defect. No redness. NEURO: A&O x 3; CNs intact. Nonfocal.  A/P: Mouth ulcers- RX: valacyclovir 2 g dose 12 hours apart; repeat in 3 days if not improved. RX:  Magic Mouthwash.  Recurrent herpes labialis- As above. As advised toothpaste that does not contain fluoride (try [employer] [place]).  HTN (hypertension), benign- Stable and well controlled on current medications.  Constipation, unspecified constipation type- Rx: Miralax.  Meds ordered this encounter  Medications  . valACYclovir (VALTREX) 1000 MG tablet    Sig: Take 2 tabs PO 12 hours apart; may repeat prn in 3 days if no improvement;    Dispense:  16 tablet    Refill:  1  . polyethylene glycol powder (GLYCOLAX/MIRALAX) powder    Sig: Take 17 g by mouth daily.    Dispense:  3350 g    Refill:  1  . Alum & Mag Hydroxide-Simeth (MAGIC MOUTHWASH) SOLN    Sig: Take 5 mLs by mouth 4 (four) times daily.    Dispense:  15 mL    Refill:  2

## 2014-12-31 DIAGNOSIS — Z0271 Encounter for disability determination: Secondary | ICD-10-CM

## 2015-01-08 ENCOUNTER — Other Ambulatory Visit: Payer: Self-pay | Admitting: Family Medicine

## 2015-01-14 ENCOUNTER — Other Ambulatory Visit: Payer: Self-pay | Admitting: Family Medicine

## 2015-01-16 NOTE — Telephone Encounter (Signed)
Done

## 2015-02-24 ENCOUNTER — Ambulatory Visit (INDEPENDENT_AMBULATORY_CARE_PROVIDER_SITE_OTHER): Payer: 59 | Admitting: Physician Assistant

## 2015-02-24 ENCOUNTER — Encounter: Payer: Self-pay | Admitting: Physician Assistant

## 2015-02-24 VITALS — BP 146/87 | HR 94 | Temp 98.1°F | Resp 16 | Ht 65.25 in | Wt 156.0 lb

## 2015-02-24 DIAGNOSIS — Z23 Encounter for immunization: Secondary | ICD-10-CM | POA: Diagnosis not present

## 2015-02-24 DIAGNOSIS — Z79899 Other long term (current) drug therapy: Secondary | ICD-10-CM | POA: Diagnosis not present

## 2015-02-24 DIAGNOSIS — Z139 Encounter for screening, unspecified: Secondary | ICD-10-CM | POA: Diagnosis not present

## 2015-02-24 DIAGNOSIS — Z0000 Encounter for general adult medical examination without abnormal findings: Secondary | ICD-10-CM

## 2015-02-24 LAB — COMPLETE METABOLIC PANEL WITH GFR
ALT: 28 U/L (ref 9–46)
AST: 28 U/L (ref 10–35)
Albumin: 4.8 g/dL (ref 3.6–5.1)
Alkaline Phosphatase: 56 U/L (ref 40–115)
BUN: 12 mg/dL (ref 7–25)
CO2: 26 mmol/L (ref 20–31)
Calcium: 9.6 mg/dL (ref 8.6–10.3)
Chloride: 98 mmol/L (ref 98–110)
Creat: 1.18 mg/dL (ref 0.70–1.25)
GFR, Est African American: 77 mL/min (ref >=60)
GFR, Est Non African American: 66 mL/min (ref >=60)
Glucose, Bld: 97 mg/dL (ref 65–99)
Potassium: 4.6 mmol/L (ref 3.5–5.3)
Sodium: 136 mmol/L (ref 135–146)
Total Bilirubin: 0.8 mg/dL (ref 0.2–1.2)
Total Protein: 7.6 g/dL (ref 6.1–8.1)

## 2015-02-24 LAB — CBC
HCT: 50.2 % (ref 39.0–52.0)
Hemoglobin: 16.8 g/dL (ref 13.0–17.0)
MCH: 33.3 pg (ref 26.0–34.0)
MCHC: 33.5 g/dL (ref 30.0–36.0)
MCV: 99.4 fL (ref 78.0–100.0)
MPV: 9.1 fL (ref 8.6–12.4)
Platelets: 247 10*3/uL (ref 150–400)
RBC: 5.05 MIL/uL (ref 4.22–5.81)
RDW: 16.6 % — ABNORMAL HIGH (ref 11.5–15.5)
WBC: 5.7 10*3/uL (ref 4.0–10.5)

## 2015-02-24 LAB — LIPID PANEL
Cholesterol: 248 mg/dL — ABNORMAL HIGH (ref 125–200)
HDL: 59 mg/dL (ref >=40)
LDL Cholesterol: 161 mg/dL — ABNORMAL HIGH (ref <130)
Total CHOL/HDL Ratio: 4.2 Ratio (ref <=5.0)
Triglycerides: 138 mg/dL (ref <150)
VLDL: 28 mg/dL (ref <30)

## 2015-02-24 LAB — TSH: TSH: 1.087 u[IU]/mL (ref 0.350–4.500)

## 2015-02-24 MED ORDER — SILDENAFIL CITRATE 20 MG PO TABS: 20.0000 mg | ORAL_TABLET | Freq: Three times a day (TID) | ORAL | Status: DC

## 2015-02-24 MED ORDER — AMLODIPINE BESYLATE 2.5 MG PO TABS: 2.5000 mg | ORAL_TABLET | Freq: Every day | ORAL | Status: DC

## 2015-02-24 MED ORDER — LORAZEPAM 1 MG PO TABS: ORAL_TABLET | ORAL | Status: DC

## 2015-02-24 MED ORDER — POLYETHYLENE GLYCOL 3350 17 GM/SCOOP PO POWD: 17.0000 g | Freq: Every day | ORAL | Status: DC

## 2015-02-24 NOTE — Patient Instructions (Addendum)
1.  Please follow up with GI doctor 2.  Start new BP medication and continue to monitor your blood pressure 3.  Please ask your rheumatologist with regard to ASA daily.

## 2015-02-24 NOTE — Progress Notes (Addendum)
02/24/2015 at 1:13 PM  John Mcclain / DOB: 1956-03-31 / MRN: [id number]  The patient has Type II or unspecified type diabetes mellitus without mention of complication, not stated as uncontrolled; Hypogonadism male; Vitamin D deficiency; Rheumatoid arthritis(714.0); Migraine headache disorder; Anxiety disorder; HTN (hypertension), benign; Nephrolithiasis; Duodenitis- diagnosed and treated in 2011; Hemorrhoids, thrombosed; and Bleeding internal hemorrhoids on his problem list.  SUBJECTIVE  John Mcclain is a 59 y.o. well appearing male presenting for the chief complaint of annual exam.  He had a colonoscopy 5 years ago with a history of polyps and has been asked to come back in roughly 5 years.  He can not remember the name of his GI doc today, but reports that he will call them to establish a follow up once he gets home.    He has a history of diabetes and the last time he checked his fasting sugar was 90-100.    He has a history of HTN and checks his BP daily and reports his BP has been running 140/80.  He does not take a statin.  He does not take ASA.   Patient with history of rheumatoid arthritis for 16 years.  Managed by John Mcclain in rheumatology.  His Methotrexate has been tapered down recently due to increased liver enzymes and patient reports his symptoms are starting to re-emerge.  His next follow up is near the end of December.    Patient also has a history of chronic migraine HA without aura.  Is currently stopping gapapentin, which has helped but has not resolved his symptoms. Has also tried imipramine without success. He will be starting Depakote in roughly 4 days.  Is managed by John Mcclain.  He has had these HA's since he was 15.  He does have an eye doctor.     He  has a past medical history of Arthritis; Depression; Diabetes mellitus without complication; Anxiety; Heart murmur; Hypertension; Chronic migraine; Raynaud's disease; and  Kidney stones.    Medications reviewed and updated by myself where necessary, and exist elsewhere in the encounter.   John Mcclain is allergic to penicillins. He  reports that he has never smoked. He has never used smokeless tobacco. He reports that he drinks alcohol. He reports that he does not use illicit drugs. He  reports that he currently engages in sexual activity and has had male partners. The patient  has past surgical history that includes Colon surgery; Vasectomy; and Lymph node dissection.  His family history includes Alzheimer's disease in his paternal grandmother; Arthritis in his mother; COPD in his father and mother; Diabetes in his father; Heart attack in his father; Hyperlipidemia in his mother; Hypertension in his father and mother; Kidney disease in his mother; Migraines in his brother; Stroke in his maternal grandfather.  Review of Systems  Constitutional: Negative for fever and chills.  Respiratory: Negative for shortness of breath.   Cardiovascular: Negative for chest pain.  Gastrointestinal: Negative for nausea and abdominal pain.  Genitourinary: Negative.   Skin: Negative for rash.  Neurological: Negative for dizziness and headaches.    OBJECTIVE  His  vitals were not taken for this visit. The patient's body mass index is unknown because there is no weight on file.  Physical Exam  Vitals reviewed. Constitutional: He is oriented to person, place, and time. He appears well-developed. No distress.  Eyes: EOM are normal. Pupils are equal, round, and reactive to light. No scleral icterus.  Neck:  Normal range of motion.  Cardiovascular: Normal rate and regular rhythm.   Respiratory: Effort normal and breath sounds normal.  GI: He exhibits no distension.  Musculoskeletal: Normal range of motion.  Neurological: He is alert and oriented to person, place, and time. No cranial nerve deficit.  Skin: Skin is warm and dry. No rash noted. He is not diaphoretic.  Psychiatric:  He has a normal mood and affect.    No results found for this or any previous visit (from the past 24 hour(s)).  ASSESSMENT & PLAN  John Mcclain was seen today for annual exam.  Diagnoses and all orders for this visit:  Annual physical exam  Screening -     CBC -     Hemoglobin A1c -     Microalbumin, urine -     COMPLETE METABOLIC PANEL WITH GFR -     TSH -     Lipid panel -     HIV antibody (with reflex) -     RPR -     Hepatitis C Ab Reflex HCV RNA, QUANT -     Vitamin D, 25-hydroxy  Need for prophylactic vaccination against Streptococcus pneumoniae (pneumococcus) -     Pneumococcal conjugate vaccine 13-valent IM  Need for prophylactic vaccination and inoculation against influenza -     Flu Vaccine QUAD 36+ mos IM  Need for Zostavax administration: Will hold on this given interaction with methotrexate.   Medication management: Adding low dose Amlodipine to improve HTN given his report of ambulatory measures.  Holding on HCTZ given history of kidney stones.  He will continue to monitor his BP at home and will contact me if he continues to have numbers consistently above 140/90. Holding on ASA for now given interaction with Methotrexate. His history or diabetes is spurious, as he has never had an elevated blood sugar or A1c in the [facility].  Will assess with labs today and will remove from problem list if his numbers continue.   -     sildenafil (REVATIO) 20 MG tablet; Take 1 tablet (20 mg total) by mouth 3 (three) times daily. -     amLODipine (NORVASC) 2.5 MG tablet; Take 1 tablet (2.5 mg total) by mouth daily. -     polyethylene glycol powder (GLYCOLAX/MIRALAX) powder; Take 17 g by mouth daily. -     LORazepam (ATIVAN) 1 MG tablet; TAKE ONE-HALF TO ONE TABLET BY MOUTH EVERY 8 HOURS AS NEEDED FOR ANXIETY     The patient was advised to call or come back to clinic if he does not see an improvement in symptoms, or worsens with the above plan.   [name], MHS,  PA-C [facility] 02/24/2015 1:13 PM   02/28/2015 12:17 PM Addendum: Labs routed to rheumatologist and neurologist.  Patient's liver enzymes dramatically reduced with reduction in methotrexate. Will consider ASA and low dose statin to reduce ASCVD risk with input of rheumatologist.  [name], MS, PA-C   12:19 PM, 02/28/2015

## 2015-02-25 LAB — RPR

## 2015-02-25 LAB — VITAMIN D 25 HYDROXY (VIT D DEFICIENCY, FRACTURES): Vit D, 25-Hydroxy: 43 ng/mL (ref 30–100)

## 2015-02-25 LAB — HEMOGLOBIN A1C
Hgb A1c MFr Bld: 5.7 % — ABNORMAL HIGH (ref <5.7)
Mean Plasma Glucose: 117 mg/dL — ABNORMAL HIGH (ref <117)

## 2015-02-25 LAB — HIV ANTIBODY (ROUTINE TESTING W REFLEX): HIV 1&2 Ab, 4th Generation: NONREACTIVE

## 2015-02-25 LAB — MICROALBUMIN, URINE: Microalb, Ur: 0.5 mg/dL (ref <2.0)

## 2015-02-25 LAB — HEPATITIS C ANTIBODY: HCV Ab: NEGATIVE

## 2015-02-28 ENCOUNTER — Other Ambulatory Visit: Payer: Self-pay | Admitting: Physician Assistant

## 2015-02-28 NOTE — Addendum Note (Signed)
Addended by: Tereasa Coop on: 02/28/2015 12:20 PM   Modules accepted: Miquel Dunn

## 2015-03-11 ENCOUNTER — Encounter: Payer: Self-pay | Admitting: Family Medicine

## 2015-03-23 ENCOUNTER — Other Ambulatory Visit: Payer: Self-pay | Admitting: Physician Assistant

## 2015-03-23 ENCOUNTER — Telehealth: Payer: Self-pay

## 2015-03-23 NOTE — Telephone Encounter (Signed)
Patient of [name], PA-C. States he needs an ophthamology referral. He wants to go to [facility] and their phone number is [phone]. Patient's call back number is [phone].

## 2015-03-24 NOTE — Telephone Encounter (Signed)
[facility] referral. He just needs an eye exam. Which diagnosis code to use?

## 2015-03-24 NOTE — Telephone Encounter (Signed)
Can use code E11.9 for controlled type II DM w/o comp

## 2015-03-25 ENCOUNTER — Encounter: Payer: 59 | Admitting: Physician Assistant

## 2015-04-01 NOTE — Telephone Encounter (Signed)
We need to consider an SSRI for management of chronic anxiety or other mood disorders that would require daily chronic benzodiazapine therapy. I will refill twice, including this month, and patient should be seen back at 104 if possible. [name], MS, PA-C   7:18 PM, 04/01/2015

## 2015-04-02 NOTE — Telephone Encounter (Signed)
Called and advised pt of sooner f/up needed than his sch appt in March (w/in this mos or next). Pt agreed. Faxed Rx

## 2015-05-12 ENCOUNTER — Ambulatory Visit (INDEPENDENT_AMBULATORY_CARE_PROVIDER_SITE_OTHER): Payer: 59 | Admitting: Physician Assistant

## 2015-05-12 ENCOUNTER — Encounter: Payer: Self-pay | Admitting: Physician Assistant

## 2015-05-12 VITALS — BP 126/76 | HR 89 | Temp 97.7°F | Resp 16 | Ht 65.5 in | Wt 162.6 lb

## 2015-05-12 DIAGNOSIS — Z79899 Other long term (current) drug therapy: Secondary | ICD-10-CM | POA: Diagnosis not present

## 2015-05-12 DIAGNOSIS — R590 Localized enlarged lymph nodes: Secondary | ICD-10-CM

## 2015-05-12 DIAGNOSIS — Z299 Encounter for prophylactic measures, unspecified: Secondary | ICD-10-CM

## 2015-05-12 DIAGNOSIS — R599 Enlarged lymph nodes, unspecified: Secondary | ICD-10-CM

## 2015-05-12 LAB — C-REACTIVE PROTEIN: CRP: <0.5 mg/dL (ref <0.60)

## 2015-05-12 MED ORDER — LORAZEPAM 1 MG PO TABS: ORAL_TABLET | ORAL | Status: DC

## 2015-05-12 MED ORDER — ROSUVASTATIN CALCIUM 5 MG PO TABS: 5.0000 mg | ORAL_TABLET | Freq: Every day | ORAL | Status: DC

## 2015-05-13 LAB — CBC WITH DIFFERENTIAL/PLATELET
Basophils Absolute: 0 10*3/uL (ref 0.0–0.1)
Basophils Relative: 1 % (ref 0–1)
Eosinophils Absolute: 0.2 10*3/uL (ref 0.0–0.7)
Eosinophils Relative: 4 % (ref 0–5)
HCT: 45.1 % (ref 39.0–52.0)
Hemoglobin: 15.2 g/dL (ref 13.0–17.0)
Lymphocytes Relative: 21 % (ref 12–46)
Lymphs Abs: 0.9 10*3/uL (ref 0.7–4.0)
MCH: 34 pg (ref 26.0–34.0)
MCHC: 33.7 g/dL (ref 30.0–36.0)
MCV: 100.9 fL — ABNORMAL HIGH (ref 78.0–100.0)
MPV: 9.4 fL (ref 8.6–12.4)
Monocytes Absolute: 0.9 10*3/uL (ref 0.1–1.0)
Monocytes Relative: 22 % — ABNORMAL HIGH (ref 3–12)
Neutro Abs: 2.2 10*3/uL (ref 1.7–7.7)
Neutrophils Relative %: 52 % (ref 43–77)
Platelets: 209 10*3/uL (ref 150–400)
RBC: 4.47 MIL/uL (ref 4.22–5.81)
RDW: 16.9 % — ABNORMAL HIGH (ref 11.5–15.5)
WBC: 4.3 10*3/uL (ref 4.0–10.5)

## 2015-05-13 LAB — SEDIMENTATION RATE: Sed Rate: 1 mm/hr (ref 0–20)

## 2015-05-13 LAB — PATHOLOGIST SMEAR REVIEW

## 2015-05-15 NOTE — Progress Notes (Signed)
05/15/2015 6:50 PM   DOB: 04-30-1956 / MRN: [id number]  SUBJECTIVE:  John Mcclain is a 59 y.o. male presenting for medication management and refill.  He would like further refills of his lorazepam for the reason of tremor and anxiety, and reports the tremor becomes worse when he is anxious.  He is not taking an SSRI for long term management of anxious symptoms.   He has along history of RA and HA and is management by rheum and neurology.   He complains of bilateral inguinal lymphadenopathy that has been present for one month. This is a new problem. Reports there is some tenderness of the area and no significant swelling.  No new sexual partners.    He is allergic to penicillins.   He  has a past medical history of Arthritis; Depression; Diabetes mellitus without complication ([place]); Anxiety; Heart murmur; Hypertension; Chronic migraine; Raynaud's disease; and Kidney stones.    He  reports that he has never smoked. He has never used smokeless tobacco. He reports that he drinks alcohol. He reports that he does not use illicit drugs. He  reports that he currently engages in sexual activity and has had male partners. The patient  has past surgical history that includes Colon surgery; Vasectomy; and Lymph node dissection.  His family history includes Alzheimer's disease in his paternal grandmother; Arthritis in his mother; COPD in his father and mother; Diabetes in his father; Heart attack in his father; Hyperlipidemia in his mother; Hypertension in his father and mother; Kidney disease in his mother; Migraines in his brother; Stroke in his maternal grandfather.  ROS  Problem list and medications reviewed and updated by myself where necessary, and exist elsewhere in the encounter.   OBJECTIVE:  BP 126/76 mmHg  Pulse 89  Temp(Src) 97.7 F (36.5 C) (Oral)  Resp 16  Ht 5' 5.5" (1.664 m)  Wt 162 lb 9.6 oz (73.755 kg)  BMI 26.64 kg/m2 CrCl cannot be calculated (Patient has no serum creatinine  result on file.).  Physical Exam  No results found for this or any previous visit (from the past 48 hour(s)).  ASSESSMENT AND PLAN  John Mcclain was seen today for follow-up and labs.  Diagnoses and all orders for this visit:  Encounter for prophylactic measures, unspecified: Pt with ASCVD risk greater than 7.5.  Will start a low dose statin.  Side effects discussed.  Will recheck CMET in one month.   -     rosuvastatin (CRESTOR) 5 MG tablet; Take 1 tablet (5 mg total) by mouth daily.  Inguinal lymphadenopathy: Smear reassuring.  Given that he is immunocompromised will routinely CT the abdomen and pelvis.   -     Sedimentation Rate -     C-reactive protein -     CBC with Differential/Platelet -     Pathologist smear review  Medication management: Advised that if he is using this for long term anxiety relief and not tremor then we will need to start an SSRI to manage his symptoms.  Patient in agreement.   -     LORazepam (ATIVAN) 1 MG tablet; TAKE ONE-HALF TO ONE TABLET BY MOUTH EVERY 8 HOURS AS NEEDED FOR ANXIETY.    The patient was advised to call or return to clinic if he does not see an improvement in symptoms or to seek the care of the closest emergency department if he worsens with the above plan.   [name], MHS, PA-C [facility]  05/15/2015 6:50 PM

## 2015-05-17 NOTE — Progress Notes (Signed)
Sent message to Referrals to schedule appointment for patient.

## 2015-05-21 ENCOUNTER — Other Ambulatory Visit (INDEPENDENT_AMBULATORY_CARE_PROVIDER_SITE_OTHER): Payer: 59

## 2015-05-21 ENCOUNTER — Telehealth: Payer: Self-pay

## 2015-05-21 DIAGNOSIS — I10 Essential (primary) hypertension: Secondary | ICD-10-CM | POA: Diagnosis not present

## 2015-05-21 LAB — BASIC METABOLIC PANEL
BUN: 17 mg/dL (ref 7–25)
CO2: 27 mmol/L (ref 20–31)
Calcium: 8.9 mg/dL (ref 8.6–10.3)
Chloride: 102 mmol/L (ref 98–110)
Creat: 1.13 mg/dL (ref 0.70–1.25)
Glucose, Bld: 116 mg/dL — ABNORMAL HIGH (ref 65–99)
Potassium: 4.4 mmol/L (ref 3.5–5.3)
Sodium: 137 mmol/L (ref 135–146)

## 2015-05-21 NOTE — Addendum Note (Signed)
Addended by: Elie Confer on: 05/21/2015 04:47 PM   Modules accepted: Orders

## 2015-05-21 NOTE — Telephone Encounter (Signed)
Pt needs a BUN Cr for his CT Monday. Future order placed. He will come by today

## 2015-05-24 ENCOUNTER — Ambulatory Visit
Admission: RE | Admit: 2015-05-24 | Discharge: 2015-05-24 | Disposition: A | Discharge status: Home / Self Care | Payer: 59 | Source: Ambulatory Visit | Attending: Physician Assistant | Admitting: Physician Assistant

## 2015-05-24 DIAGNOSIS — R590 Localized enlarged lymph nodes: Secondary | ICD-10-CM

## 2015-05-24 MED ORDER — IOPAMIDOL (ISOVUE-300) INJECTION 61%
100.0000 mL | Freq: Once | INTRAVENOUS | Status: AC | PRN
  Administered 2015-05-24: 100 mL via INTRAVENOUS

## 2015-05-28 ENCOUNTER — Other Ambulatory Visit: Payer: Self-pay

## 2015-05-28 MED ORDER — LISINOPRIL 10 MG PO TABS: 10.0000 mg | ORAL_TABLET | Freq: Every day | ORAL | Status: DC

## 2015-08-10 ENCOUNTER — Other Ambulatory Visit: Payer: Self-pay | Admitting: Physician Assistant

## 2015-08-26 ENCOUNTER — Ambulatory Visit: Payer: Self-pay | Admitting: Physician Assistant

## 2015-09-01 ENCOUNTER — Ambulatory Visit (INDEPENDENT_AMBULATORY_CARE_PROVIDER_SITE_OTHER): Payer: BLUE CROSS/BLUE SHIELD | Admitting: Family Medicine

## 2015-09-01 ENCOUNTER — Encounter: Payer: Self-pay | Admitting: Physician Assistant

## 2015-09-01 VITALS — BP 138/85 | HR 81 | Temp 98.1°F | Resp 16 | Ht 65.0 in | Wt 164.0 lb

## 2015-09-01 DIAGNOSIS — K4020 Bilateral inguinal hernia, without obstruction or gangrene, not specified as recurrent: Secondary | ICD-10-CM

## 2015-09-01 DIAGNOSIS — I10 Essential (primary) hypertension: Secondary | ICD-10-CM

## 2015-09-01 DIAGNOSIS — J029 Acute pharyngitis, unspecified: Secondary | ICD-10-CM

## 2015-09-01 DIAGNOSIS — R251 Tremor, unspecified: Secondary | ICD-10-CM | POA: Diagnosis not present

## 2015-09-01 DIAGNOSIS — E119 Type 2 diabetes mellitus without complications: Secondary | ICD-10-CM

## 2015-09-01 DIAGNOSIS — Z79899 Other long term (current) drug therapy: Secondary | ICD-10-CM

## 2015-09-01 DIAGNOSIS — E785 Hyperlipidemia, unspecified: Secondary | ICD-10-CM

## 2015-09-01 DIAGNOSIS — R7989 Other specified abnormal findings of blood chemistry: Secondary | ICD-10-CM

## 2015-09-01 DIAGNOSIS — F411 Generalized anxiety disorder: Secondary | ICD-10-CM | POA: Diagnosis not present

## 2015-09-01 DIAGNOSIS — J309 Allergic rhinitis, unspecified: Secondary | ICD-10-CM

## 2015-09-01 DIAGNOSIS — R945 Abnormal results of liver function studies: Secondary | ICD-10-CM

## 2015-09-01 LAB — COMPLETE METABOLIC PANEL WITH GFR
ALT: 68 U/L — ABNORMAL HIGH (ref 9–46)
AST: 68 U/L — ABNORMAL HIGH (ref 10–35)
Albumin: 4.4 g/dL (ref 3.6–5.1)
Alkaline Phosphatase: 40 U/L (ref 40–115)
BUN: 13 mg/dL (ref 7–25)
CO2: 31 mmol/L (ref 20–31)
Calcium: 9.4 mg/dL (ref 8.6–10.3)
Chloride: 99 mmol/L (ref 98–110)
Creat: 1.16 mg/dL (ref 0.70–1.25)
GFR, Est African American: 78 mL/min (ref >=60)
GFR, Est Non African American: 67 mL/min (ref >=60)
Glucose, Bld: 90 mg/dL (ref 65–99)
Potassium: 4.7 mmol/L (ref 3.5–5.3)
Sodium: 139 mmol/L (ref 135–146)
Total Bilirubin: 0.8 mg/dL (ref 0.2–1.2)
Total Protein: 6.8 g/dL (ref 6.1–8.1)

## 2015-09-01 LAB — LIPID PANEL
Cholesterol: 134 mg/dL (ref 125–200)
HDL: 56 mg/dL (ref >=40)
LDL Cholesterol: 50 mg/dL (ref <130)
Total CHOL/HDL Ratio: 2.4 Ratio (ref <=5.0)
Triglycerides: 139 mg/dL (ref <150)
VLDL: 28 mg/dL (ref <30)

## 2015-09-01 MED ORDER — LORAZEPAM 1 MG PO TABS: ORAL_TABLET | ORAL | Status: DC

## 2015-09-01 NOTE — Progress Notes (Signed)
Subjective:    Patient ID: John Mcclain, male    DOB: 19-Jan-1956, 60 y.o.   MRN: [id number] By signing my name below, I, [name], attest that this documentation has been prepared under the direction and in the presence of [name], MD.  Electronically Signed: [name], Medical Scribe. 09/01/2015. 4:55 PM.  HPI HPI Comments: [name] is a 60 y.o. male with a hx of rheumatoid arthritis, migraine headaches, anxiety, HLD, DM and HTN who presents to the [facility] for a follow-up. Usually followed by [name], PA-C. Patient is fasting today.  Diabetes: Diet-controlled. He states his blood sugar had come down even though he has not changed his diet significantly. Lab Results  Component Value Date   HGBA1C 5.7* 02/24/2015    Lab Results  Component Value Date   MICROALBUR 0.5 02/24/2015    Hypertension: He takes amlodipine 2.5 mg qd and lisinopril 10 mg qd. His blood pressures have been around 130/80 at home. Patient denies any side effects with his medications.  Rheumatoid arthritis: He is followed by rheumatology.  History of migraine headaches: Followed by neurology.  Anxiety: He was prescribed Ativan 1 mg 0.5-1 every 8 hours prn at Dec 7th office visit with plan at that time to start SSRI if he was needing long-term Ativan treatment for his anxiety. Most of the time, he takes 0.5 tablet of the Ativan twice a day but occasionally takes an extra half dose. He estimates that 30 Ativan will last him about a month. Patient states the Ativan is primarily for his upper extremity tremors. He has seen neurology for this. His neurologist told him the Depakote may be attributed to his tremors. He has not been on an SSRI before.  Inguinal hernias: Was evaluated for bilateral inguinal symptoms in December. CT scan showed inguinal hernias. Asymptomatic. RTC precautions were given. He still has some groin pain intermittently. He has been to general surgery before,  but he does not remember the name of the surgeon he saw.  Hyperlipidemia: He was started on Crestor 5 mg qd at the Dec 7th visit. Patient denies side effects with the Crestor. Lab Results  Component Value Date   CHOL 248* 02/24/2015   HDL 59 02/24/2015   LDLCALC 161* 02/24/2015   TRIG 138 02/24/2015   CHOLHDL 4.2 02/24/2015    Lab Results  Component Value Date   ALT 28 02/24/2015   AST 28 02/24/2015   ALKPHOS 56 02/24/2015   BILITOT 0.8 02/24/2015    Sore throat: Patient reports having intermittent, left-sided sore throat that started a few weeks ago. He believes this is due to allergies as he has also had itchy eyes. He has been taking Zyrtec and OTC eye drops. Patient denies fever. He notes that his wife has been coughing.   Patient Active Problem List   Diagnosis Date Noted  . Hemorrhoids, thrombosed 02/06/2013  . Bleeding internal hemorrhoids 02/06/2013  . Type II or unspecified type diabetes mellitus without mention of complication, not stated as uncontrolled 12/26/2011  . Hypogonadism male 12/26/2011  . Vitamin D deficiency 12/26/2011  . Rheumatoid arthritis(714.0) 12/26/2011  . Migraine headache disorder 12/26/2011  . Anxiety disorder 12/26/2011  . HTN (hypertension), benign 12/26/2011  . Nephrolithiasis 12/26/2011  . Duodenitis- diagnosed and treated in 2011 12/26/2011   Past Medical History  Diagnosis Date  . Arthritis   . Depression   . Diabetes mellitus without complication ([place])   . Anxiety   . Heart  murmur   . Hypertension   . Chronic migraine   . Raynaud's disease   . Kidney stones    Past Surgical History  Procedure Laterality Date  . Colon surgery    . Vasectomy    . Lymph node dissection      removed lymph node   Allergies  Allergen Reactions  . Penicillins    Prior to Admission medications   Medication Sig Start Date End Date Taking? Authorizing Provider  Alum & Mag Hydroxide-Simeth (MAGIC MOUTHWASH) SOLN Take 5 mLs by mouth 4 (four) times  daily. 10/23/14  Yes Barton Fanny, MD  amLODipine (NORVASC) 2.5 MG tablet TAKE ONE TABLET BY MOUTH EVERY DAY 08/11/15  Yes Tereasa Coop, PA-C  Calcium Carbonate-Vitamin D (CALCIUM 600+D PO) Take by mouth daily.   Yes Historical Provider, MD  calcium-vitamin D (OSCAL WITH D) 500-200 MG-UNIT per tablet Take 1 tablet by mouth daily.   Yes Historical Provider, MD  cyclobenzaprine (FLEXERIL) 10 MG tablet Take 10 mg by mouth 3 (three) times daily as needed.   Yes Historical Provider, MD  divalproex (DEPAKOTE) 250 MG DR tablet Take 250 mg by mouth 3 (three) times daily.   Yes Historical Provider, MD  etanercept (ENBREL) 50 MG/ML injection Inject 50 mg into the skin once a week. Every Monday   Yes Historical Provider, MD  folic acid (FOLVITE) 1 MG tablet Take 1 mg by mouth daily.   Yes Historical Provider, MD  imipramine (TOFRANIL) 25 MG tablet Take 50 mg by mouth 2 (two) times daily.    Yes Historical Provider, MD  lisinopril (PRINIVIL,ZESTRIL) 10 MG tablet Take 1 tablet (10 mg total) by mouth daily. 05/28/15  Yes Tereasa Coop, PA-C  LORazepam (ATIVAN) 1 MG tablet TAKE ONE-HALF TO ONE TABLET BY MOUTH EVERY 8 HOURS AS NEEDED FOR ANXIETY. 05/12/15  Yes Tereasa Coop, PA-C  methotrexate (RHEUMATREX) 2.5 MG tablet Take 2.5 mg by mouth once a week. Taking five 21/2 mg tablets every Monday   Yes Historical Provider, MD  polyethylene glycol powder (GLYCOLAX/MIRALAX) powder Take 17 g by mouth daily. 02/24/15  Yes Tereasa Coop, PA-C  predniSONE (DELTASONE) 5 MG tablet Take 5 mg by mouth as needed.   Yes Historical Provider, MD  ranitidine (ZANTAC) 150 MG tablet Take 150 mg by mouth 2 (two) times daily.   Yes Historical Provider, MD  clobetasol ointment (TEMOVATE) [id number] % Apply 1 application topically 2 (two) times daily. Reported on 09/01/2015    Historical Provider, MD  [place] Reported on 09/01/2015    Historical Provider, MD  rosuvastatin (CRESTOR) 5 MG tablet Take 1 tablet (5 mg  total) by mouth daily. 05/12/15   Tereasa Coop, PA-C  sildenafil (REVATIO) 20 MG tablet Take 1 tablet (20 mg total) by mouth 3 (three) times daily. 02/24/15   Tereasa Coop, PA-C  valACYclovir (VALTREX) 1000 MG tablet TAKE TWO TABLETS BY MOUTH EVERY 12 HOURS, MAY REPEAT AS NEEDED IN 3 DAYS IF NO IMPROVEMENT Patient not taking: Reported on 02/24/2015 01/08/15   Mancel Bale, PA-C   Social History   Social History  . Marital Status: Married    Spouse Name: N/A  . Number of Children: N/A  . Years of Education: N/A   Occupational History  . Not on file.   Social History Main Topics  . Smoking status: Never Smoker   . Smokeless tobacco: Never Used  . Alcohol Use: Yes     Comment:  2-3 per week  . Drug Use: No  . Sexual Activity:    Partners: Female   Other Topics Concern  . Not on file   Social History Narrative   Married. Education: [employer].      Review of Systems  Constitutional: Negative for fever.  HENT: Positive for sore throat.   Eyes: Positive for itching.  Respiratory: Negative for shortness of breath.   Cardiovascular: Negative for chest pain.  Gastrointestinal: Positive for abdominal pain.  Musculoskeletal: Negative for myalgias.  Allergic/Immunologic: Positive for environmental allergies.  Neurological: Positive for tremors. Negative for dizziness and light-headedness.       Objective:   Physical Exam  Constitutional: He is oriented to person, place, and time. He appears well-developed and well-nourished.  HENT:  Head: Normocephalic and atraumatic.  Eyes: EOM are normal. Pupils are equal, round, and reactive to light.  Neck: No JVD present. Carotid bruit is not present.  Cardiovascular: Normal rate, regular rhythm and normal heart sounds.   No murmur heard. Pulmonary/Chest: Effort normal and breath sounds normal. He has no rales.  Genitourinary:  Slight tenderness along right inguinal fold and proximal on the right. Possible small hernia in affected area.    Musculoskeletal: He exhibits no edema.  Neurological: He is alert and oriented to person, place, and time.  Skin: Skin is warm and dry.  Psychiatric: He has a normal mood and affect.  Vitals reviewed.   Filed Vitals:   09/01/15 1612  BP: 138/85  Pulse: 81  Temp: 98.1 F (36.7 C)  TempSrc: Oral  Resp: 16  Height: 5\' 5"  (1.651 m)  Weight: 164 lb (74.39 kg)         Assessment & Plan:   Royel Borstad is a 60 y.o. male Diabetes mellitus type 2, diet-controlled ([place]) - Plan: Hemoglobin A1c  -Continue diet control for now, A1c pending  Tremor, Anxiety state  -Tremors may be related to anxiety versus side effect from the Depakote. We'll continue Ativan as needed for now, but plan to discuss this further at follow-up in the next few months as well as coordinating reason for use with neurology.  Additionally can discuss other daily anxiety medications with neurology if needed as currently on imipramine.  Essential hypertension  -Stable.  Hyperlipidemia - Plan: COMPLETE METABOLIC PANEL WITH GFR, Lipid panel  -CMP, lipids pending. Continue Crestor 5 mg daily for now.  Sore throat, Allergic rhinitis, unspecified allergic rhinitis type  -Allergic rhinitis versus early viral upper respiratory infection. Symptomatic care, RTC precautions.  Bilateral inguinal hernia without obstruction or gangrene, recurrence not specified -  Ambulatory referral to General Surgery  -Bilateral hernia noted on CT, with slight discomfort at affected area. No palpable defect, hernia precautions discuss prior to evaluation with general surgeon.  Medication management - Plan: LORazepam (ATIVAN) 1 MG tablet prescribed as above.  Follow-up with [name] in next few months. Sooner if worse.   Meds ordered this encounter  Medications  . Calcium Carbonate-Vitamin D (CALCIUM 600+D PO)    Sig: Take by mouth daily.  . predniSONE (DELTASONE) 5 MG tablet    Sig: Take 5 mg by mouth as needed.  Marland Kitchen LORazepam  (ATIVAN) 1 MG tablet    Sig: TAKE ONE-HALF TO ONE TABLET BY MOUTH EVERY 8 HOURS AS NEEDED FOR ANXIETY.    Dispense:  30 tablet    Refill:  0   Patient Instructions       IF you received an x-ray today, you will receive an invoice  from [facility]. Please contact [facility] at [phone] with questions or concerns regarding your invoice.   IF you received labwork today, you will receive an invoice from [employer]. Please contact [facility] at [phone] with questions or concerns regarding your invoice.   Our billing staff will not be able to assist you with questions regarding bills from these companies.  You will be contacted with the lab results as soon as they are available. The fastest way to get your results is to activate your My Chart account. Instructions are located on the last page of this paperwork. If you have not heard from [place] regarding the results in 2 weeks, please contact this office.     Continue same meds for now.  Refilled ativan, but may need to discuss other daily medicines at follow up with [name], which can be coordinated with your neurologist if needed.  I will refer you to general surgery to discuss hernias, but see precautions below.   Hernia, Adult A hernia is the bulging of an organ or tissue through a weak spot in the muscles of the abdomen (abdominal wall). Hernias develop most often near the navel or groin. There are many kinds of hernias. Common kinds include:  Femoral hernia. This kind of hernia develops under the groin in the upper thigh area.  Inguinal hernia. This kind of hernia develops in the groin or scrotum.  Umbilical hernia. This kind of hernia develops near the navel.  Hiatal hernia. This kind of hernia causes part of the stomach to be pushed up into the chest.  Incisional hernia. This kind of hernia bulges through a scar from an abdominal surgery. CAUSES This condition may be  caused by:  Heavy lifting.  Coughing over a long period of time.  Straining to have a bowel movement.  An incision made during an abdominal surgery.  A birth defect (congenital defect).  Excess weight or obesity.  Smoking.  Poor nutrition.  Cystic fibrosis.  Excess fluid in the abdomen.  Undescended testicles. SYMPTOMS Symptoms of a hernia include:  A lump on the abdomen. This is the first sign of a hernia. The lump may become more obvious with standing, straining, or coughing. It may get bigger over time if it is not treated or if the condition causing it is not treated.  Pain. A hernia is usually painless, but it may become painful over time if treatment is delayed. The pain is usually dull and may get worse with standing or lifting heavy objects. Sometimes a hernia gets tightly squeezed in the weak spot (strangulated) or stuck there (incarcerated) and causes additional symptoms. These symptoms may include:  Vomiting.  Nausea.  Constipation.  Irritability. DIAGNOSIS A hernia may be diagnosed with:  A physical exam. During the exam your health care provider may ask you to cough or to make a specific movement, because a hernia is usually more visible when you move.  Imaging tests. These can include:  X-rays.  Ultrasound.  CT scan. TREATMENT A hernia that is small and painless may not need to be treated. A hernia that is large or painful may be treated with surgery. Inguinal hernias may be treated with surgery to prevent incarceration or strangulation. Strangulated hernias are always treated with surgery, because lack of blood to the trapped organ or tissue can cause it to die. Surgery to treat a hernia involves pushing the bulge back into place and repairing the weak part of the abdomen. HOME CARE INSTRUCTIONS  Avoid straining.  Do not lift anything heavier than 10 lb (4.5 kg).  Lift with your leg muscles, not your back muscles. This helps avoid  strain.  When coughing, try to cough gently.  Prevent constipation. Constipation leads to straining with bowel movements, which can make a hernia worse or cause a hernia repair to break down. You can prevent constipation by:  Eating a high-fiber diet that includes plenty of fruits and vegetables.  Drinking enough fluids to keep your urine clear or pale yellow. Aim to drink 6-8 glasses of water per day.  Using a stool softener as directed by your health care provider.  Lose weight, if you are overweight.  Do not use any tobacco products, including cigarettes, chewing tobacco, or electronic cigarettes. If you need help quitting, ask your health care provider.  Keep all follow-up visits as directed by your health care provider. This is important. Your health care provider may need to monitor your condition. SEEK MEDICAL CARE IF:  You have swelling, redness, and pain in the affected area.  Your bowel habits change. SEEK IMMEDIATE MEDICAL CARE IF:  You have a fever.  You have abdominal pain that is getting worse.  You feel nauseous or you vomit.  You cannot push the hernia back in place by gently pressing on it while you are lying down.  The hernia:  Changes in shape or size.  Is stuck outside the abdomen.  Becomes discolored.  Feels hard or tender.   This information is not intended to replace advice given to you by your health care provider. Make sure you discuss any questions you have with your health care provider.   Document Released: 05/22/2005 Document Revised: 06/12/2014 Document Reviewed: 04/01/2014 Elsevier Interactive Patient Education [employer].     I personally performed the services described in this documentation, which was scribed in my presence. The recorded information has been reviewed and considered, and addended by me as needed.

## 2015-09-01 NOTE — Patient Instructions (Addendum)
IF you received an x-ray today, you will receive an invoice from [facility]. Please contact [facility] at [phone] with questions or concerns regarding your invoice.   IF you received labwork today, you will receive an invoice from [employer]. Please contact [facility] at [phone] with questions or concerns regarding your invoice.   Our billing staff will not be able to assist you with questions regarding bills from these companies.  You will be contacted with the lab results as soon as they are available. The fastest way to get your results is to activate your My Chart account. Instructions are located on the last page of this paperwork. If you have not heard from [place] regarding the results in 2 weeks, please contact this office.     Continue same meds for now.  Refilled ativan, but may need to discuss other daily medicines at follow up with [name], which can be coordinated with your neurologist if needed.  I will refer you to general surgery to discuss hernias, but see precautions below.   Hernia, Adult A hernia is the bulging of an organ or tissue through a weak spot in the muscles of the abdomen (abdominal wall). Hernias develop most often near the navel or groin. There are many kinds of hernias. Common kinds include:  Femoral hernia. This kind of hernia develops under the groin in the upper thigh area.  Inguinal hernia. This kind of hernia develops in the groin or scrotum.  Umbilical hernia. This kind of hernia develops near the navel.  Hiatal hernia. This kind of hernia causes part of the stomach to be pushed up into the chest.  Incisional hernia. This kind of hernia bulges through a scar from an abdominal surgery. CAUSES This condition may be caused by:  Heavy lifting.  Coughing over a long period of time.  Straining to have a bowel movement.  An incision made during an abdominal surgery.  A birth defect  (congenital defect).  Excess weight or obesity.  Smoking.  Poor nutrition.  Cystic fibrosis.  Excess fluid in the abdomen.  Undescended testicles. SYMPTOMS Symptoms of a hernia include:  A lump on the abdomen. This is the first sign of a hernia. The lump may become more obvious with standing, straining, or coughing. It may get bigger over time if it is not treated or if the condition causing it is not treated.  Pain. A hernia is usually painless, but it may become painful over time if treatment is delayed. The pain is usually dull and may get worse with standing or lifting heavy objects. Sometimes a hernia gets tightly squeezed in the weak spot (strangulated) or stuck there (incarcerated) and causes additional symptoms. These symptoms may include:  Vomiting.  Nausea.  Constipation.  Irritability. DIAGNOSIS A hernia may be diagnosed with:  A physical exam. During the exam your health care provider may ask you to cough or to make a specific movement, because a hernia is usually more visible when you move.  Imaging tests. These can include:  X-rays.  Ultrasound.  CT scan. TREATMENT A hernia that is small and painless may not need to be treated. A hernia that is large or painful may be treated with surgery. Inguinal hernias may be treated with surgery to prevent incarceration or strangulation. Strangulated hernias are always treated with surgery, because lack of blood to the trapped organ or tissue can cause it to die. Surgery to treat a hernia involves pushing the bulge  back into place and repairing the weak part of the abdomen. HOME CARE INSTRUCTIONS  Avoid straining.  Do not lift anything heavier than 10 lb (4.5 kg).  Lift with your leg muscles, not your back muscles. This helps avoid strain.  When coughing, try to cough gently.  Prevent constipation. Constipation leads to straining with bowel movements, which can make a hernia worse or cause a hernia repair to  break down. You can prevent constipation by:  Eating a high-fiber diet that includes plenty of fruits and vegetables.  Drinking enough fluids to keep your urine clear or pale yellow. Aim to drink 6-8 glasses of water per day.  Using a stool softener as directed by your health care provider.  Lose weight, if you are overweight.  Do not use any tobacco products, including cigarettes, chewing tobacco, or electronic cigarettes. If you need help quitting, ask your health care provider.  Keep all follow-up visits as directed by your health care provider. This is important. Your health care provider may need to monitor your condition. SEEK MEDICAL CARE IF:  You have swelling, redness, and pain in the affected area.  Your bowel habits change. SEEK IMMEDIATE MEDICAL CARE IF:  You have a fever.  You have abdominal pain that is getting worse.  You feel nauseous or you vomit.  You cannot push the hernia back in place by gently pressing on it while you are lying down.  The hernia:  Changes in shape or size.  Is stuck outside the abdomen.  Becomes discolored.  Feels hard or tender.   This information is not intended to replace advice given to you by your health care provider. Make sure you discuss any questions you have with your health care provider.   Document Released: 05/22/2005 Document Revised: 06/12/2014 Document Reviewed: 04/01/2014 Elsevier Interactive Patient Education [employer].

## 2015-09-02 ENCOUNTER — Other Ambulatory Visit: Payer: Self-pay | Admitting: Physician Assistant

## 2015-09-02 LAB — HEMOGLOBIN A1C
Hgb A1c MFr Bld: 5.6 % (ref <5.7)
Mean Plasma Glucose: 114 mg/dL

## 2015-09-22 NOTE — Addendum Note (Signed)
Addended by: [name] R on: 09/22/2015 06:36 PM   Modules accepted: Orders

## 2015-09-28 ENCOUNTER — Telehealth: Payer: Self-pay

## 2015-09-28 ENCOUNTER — Ambulatory Visit (INDEPENDENT_AMBULATORY_CARE_PROVIDER_SITE_OTHER): Payer: BLUE CROSS/BLUE SHIELD | Admitting: Physician Assistant

## 2015-09-28 VITALS — BP 116/72 | HR 68 | Temp 98.1°F | Resp 16 | Ht 66.0 in | Wt 163.0 lb

## 2015-09-28 DIAGNOSIS — K121 Other forms of stomatitis: Secondary | ICD-10-CM | POA: Diagnosis not present

## 2015-09-28 DIAGNOSIS — R070 Pain in throat: Secondary | ICD-10-CM

## 2015-09-28 MED ORDER — VALACYCLOVIR HCL 1 G PO TABS: 2000.0000 mg | ORAL_TABLET | Freq: Two times a day (BID) | ORAL | Status: DC

## 2015-09-28 MED ORDER — MAGIC MOUTHWASH: 5.0000 mL | Freq: Four times a day (QID) | ORAL | Status: DC

## 2015-09-28 NOTE — Patient Instructions (Addendum)
     IF you received an x-ray today, you will receive an invoice from [facility]. Please contact [facility] at [phone] with questions or concerns regarding your invoice.   IF you received labwork today, you will receive an invoice from [employer]. Please contact [facility] at [phone] with questions or concerns regarding your invoice.   Our billing staff will not be able to assist you with questions regarding bills from these companies.  You will be contacted with the lab results as soon as they are available. The fastest way to get your results is to activate your My Chart account. Instructions are located on the last page of this paperwork. If you have not heard from [place] regarding the results in 2 weeks, please contact this office.    Please take the medication as prescribed.  I would like to know if you are not having improvement in the next week.  Please return sooner if you experience shortness of breath, trouble breathing, or uncontrolled fevers.

## 2015-09-28 NOTE — Telephone Encounter (Signed)
Pharmacy called about pt. Magic mouth wash, Colletta [place] was able to talk to the pharmacist

## 2015-09-30 NOTE — Progress Notes (Signed)
[facility] [address] [place] [address] [phone]  Date:  09/28/2015   Name:  John Mcclain   DOB:  12/21/1955   MRN:  [id number]  PCP:  Ellsworth Lennox, MD    History of Present Illness:  Nafis Mcclain is a 60 y.o. male patient who presents to [facility] for cc of lesion in mouth and throat pain.   Patient has had a few days of a plaque on the left side of his tongue.  He has also noticed pain on the left side of his throat.  He has no sob or dyspnea.  Hurts to swallow, and to eat more spicy/acidic foods. He has no drooling.  No fever.  No recent illnesses.  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  He has had this before, and treated successfully with anti-viral and supportive treatment. No cough or congestion.   Patient Active Problem List   Diagnosis Date Noted  . Hemorrhoids, thrombosed 02/06/2013  . Bleeding internal hemorrhoids 02/06/2013  . Type II or unspecified type diabetes mellitus without mention of complication, not stated as uncontrolled 12/26/2011  . Hypogonadism male 12/26/2011  . Vitamin D deficiency 12/26/2011  . Rheumatoid arthritis(714.0) 12/26/2011  . Migraine headache disorder 12/26/2011  . Anxiety disorder 12/26/2011  . HTN (hypertension), benign 12/26/2011  . Nephrolithiasis 12/26/2011  . Duodenitis- diagnosed and treated in 2011 12/26/2011    Past Medical History  Diagnosis Date  . Arthritis   . Depression   . Diabetes mellitus without complication ([place])   . Anxiety   . Heart murmur   . Hypertension   . Chronic migraine   . Raynaud's disease   . Kidney stones     Past Surgical History  Procedure Laterality Date  . Colon surgery    . Vasectomy    . Lymph node dissection      removed lymph node    Social History  Substance Use Topics  . Smoking status: Never Smoker   . Smokeless tobacco: Never Used  . Alcohol Use: Yes     Comment: 2-3 per week    Family History  Problem Relation Age of  Onset  . Kidney disease Mother   . Hypertension Mother   . Arthritis Mother     RA  . COPD Mother   . Hyperlipidemia Mother   . Heart attack Father   . Diabetes Father   . COPD Father   . Hypertension Father   . Migraines Brother   . Stroke Maternal Grandfather   . Alzheimer's disease Paternal Grandmother     Allergies  Allergen Reactions  . Penicillins     Medication list has been reviewed and updated.  Current Outpatient Prescriptions on File Prior to Visit  Medication Sig Dispense Refill  . amLODipine (NORVASC) 2.5 MG tablet TAKE ONE TABLET BY MOUTH EVERY DAY 30 tablet 1  . Calcium Carbonate-Vitamin D (CALCIUM 600+D PO) Take by mouth daily.    . calcium-vitamin D (OSCAL WITH D) 500-200 MG-UNIT per tablet Take 1 tablet by mouth daily.    . cyclobenzaprine (FLEXERIL) 10 MG tablet Take 10 mg by mouth 3 (three) times daily as needed.    . divalproex (DEPAKOTE) 250 MG DR tablet Take 500 mg by mouth 2 (two) times daily.     Marland Kitchen etanercept (ENBREL) 50 MG/ML injection Inject 50 mg into the skin once a week. Every Monday    . folic acid (FOLVITE) 1 MG tablet Take  1 mg by mouth daily.    Marland Kitchen imipramine (TOFRANIL) 25 MG tablet Take 50 mg by mouth 2 (two) times daily.     Marland Kitchen lisinopril (PRINIVIL,ZESTRIL) 10 MG tablet Take 1 tablet (10 mg total) by mouth daily. 90 tablet 1  . LORazepam (ATIVAN) 1 MG tablet TAKE ONE-HALF TO ONE TABLET BY MOUTH EVERY 8 HOURS AS NEEDED FOR ANXIETY. 30 tablet 0  . methotrexate (RHEUMATREX) 2.5 MG tablet Take 2.5 mg by mouth once a week. Taking five 21/2 mg tablets every Monday    . OVER THE COUNTER MEDICATION Reported on 09/01/2015    . polyethylene glycol powder (GLYCOLAX/MIRALAX) powder Take 17 g by mouth daily. 3350 g 1  . predniSONE (DELTASONE) 5 MG tablet Take 5 mg by mouth as needed.    . ranitidine (ZANTAC) 150 MG tablet Take 150 mg by mouth 2 (two) times daily.    . rosuvastatin (CRESTOR) 5 MG tablet Take 1 tablet (5 mg total) by mouth daily. 90 tablet 3   . sildenafil (REVATIO) 20 MG tablet Take 1 tablet (20 mg total) by mouth 3 (three) times daily. 30 tablet 6  . clobetasol ointment (TEMOVATE) [id number] % Apply 1 application topically 2 (two) times daily. Reported on 09/28/2015     No current facility-administered medications on file prior to visit.    ROS ROS otherwise unremarkable unless listed above.  Physical Examination: BP 116/72 mmHg  Pulse 68  Temp(Src) 98.1 F (36.7 C) (Oral)  Resp 16  Ht 5\' 6"  (1.676 m)  Wt 163 lb (73.936 kg)  BMI 26.32 kg/m2  SpO2 94% Ideal Body Weight: Weight in (lb) to have BMI = 25: 154.6  Physical Exam  Constitutional: He is oriented to person, place, and time. He appears well-developed and well-nourished. No distress.  HENT:  Head: Normocephalic and atraumatic.  Mouth/Throat: Oral lesions (whitish ulcerated plaque at the left side of tongue.) present. No dental abscesses, uvula swelling or dental caries. No oropharyngeal exudate, posterior oropharyngeal edema or posterior oropharyngeal erythema.  Eyes: Conjunctivae and EOM are normal. Pupils are equal, round, and reactive to light.  Cardiovascular: Normal rate.   Pulmonary/Chest: Effort normal. No respiratory distress.  Neurological: He is alert and oriented to person, place, and time.  Skin: Skin is warm and dry. He is not diaphoretic.  Psychiatric: He has a normal mood and affect. His behavior is normal.     Assessment and Plan: John Mcclain is a 60 y.o. male who is here today for mouth lesion and throat pain. Treating with valtrex and magic mouthwash at this time as this was fruitful.  Discussed alarming sxs to warrant a more immediate return.   1. Mouth ulcers - valACYclovir (VALTREX) 1000 MG tablet; Take 2 tablets (2,000 mg total) by mouth 2 (two) times daily.  Dispense: 4 tablet; Refill: 0 - magic mouthwash SOLN; Take 5 mLs by mouth 4 (four) times daily.  Dispense: 120 mL; Refill: 2  2. Throat pain - magic mouthwash SOLN; Take 5 mLs by  mouth 4 (four) times daily.  Dispense: 120 mL; Refill: 2   Ivar Drape, PA-C [facility] 09/30/2015 8:37 AM

## 2015-10-01 ENCOUNTER — Other Ambulatory Visit: Payer: Self-pay | Admitting: Physician Assistant

## 2015-10-29 ENCOUNTER — Other Ambulatory Visit: Payer: Self-pay

## 2015-10-29 DIAGNOSIS — K4020 Bilateral inguinal hernia, without obstruction or gangrene, not specified as recurrent: Secondary | ICD-10-CM

## 2015-10-29 DIAGNOSIS — Z79899 Other long term (current) drug therapy: Secondary | ICD-10-CM

## 2015-10-29 MED ORDER — LORAZEPAM 1 MG PO TABS: ORAL_TABLET | ORAL | Status: DC

## 2015-10-29 NOTE — Telephone Encounter (Signed)
Usually followed by [name], PA-C, but I saw him last.  I refilled, but future refills to be decided by Legrand Como. Will route this note to him as well as FYI.

## 2015-10-29 NOTE — Telephone Encounter (Signed)
Pharm reqs RF of lorazepam. Pended. 

## 2015-10-30 NOTE — Telephone Encounter (Signed)
I have attempted to contact [facility] to call in pt's rx. Phone continues to ring and unable to leave VM on pharmacy line.  Attempted to call pt, unable to reach pt to see if they would like rx called in somewhere else.

## 2015-11-02 NOTE — Telephone Encounter (Signed)
Faxed

## 2015-11-22 ENCOUNTER — Other Ambulatory Visit: Payer: Self-pay | Admitting: Physician Assistant

## 2015-11-25 ENCOUNTER — Other Ambulatory Visit: Payer: Self-pay

## 2015-11-25 DIAGNOSIS — Z79899 Other long term (current) drug therapy: Secondary | ICD-10-CM

## 2015-11-25 DIAGNOSIS — K4020 Bilateral inguinal hernia, without obstruction or gangrene, not specified as recurrent: Secondary | ICD-10-CM

## 2015-11-25 NOTE — Telephone Encounter (Signed)
Pharm reqs RF of lorazepam. Pended. 

## 2015-11-26 NOTE — Telephone Encounter (Signed)
Patient of John Mcclain., will defer to him for decision on med refill.

## 2015-11-30 ENCOUNTER — Ambulatory Visit (INDEPENDENT_AMBULATORY_CARE_PROVIDER_SITE_OTHER): Payer: BLUE CROSS/BLUE SHIELD | Admitting: Physician Assistant

## 2015-11-30 VITALS — BP 124/80 | HR 99 | Temp 98.4°F | Resp 18 | Ht 66.0 in | Wt 168.0 lb

## 2015-11-30 DIAGNOSIS — M545 Low back pain, unspecified: Secondary | ICD-10-CM

## 2015-11-30 DIAGNOSIS — R7989 Other specified abnormal findings of blood chemistry: Secondary | ICD-10-CM | POA: Diagnosis not present

## 2015-11-30 DIAGNOSIS — M7581 Other shoulder lesions, right shoulder: Secondary | ICD-10-CM

## 2015-11-30 DIAGNOSIS — R945 Abnormal results of liver function studies: Principal | ICD-10-CM

## 2015-11-30 DIAGNOSIS — M778 Other enthesopathies, not elsewhere classified: Secondary | ICD-10-CM

## 2015-11-30 MED ORDER — PREDNISONE 20 MG PO TABS: 20.0000 mg | ORAL_TABLET | Freq: Every day | ORAL | Status: DC

## 2015-11-30 MED ORDER — LORAZEPAM 1 MG PO TABS: ORAL_TABLET | ORAL | Status: DC

## 2015-11-30 NOTE — Patient Instructions (Addendum)
  Call me in two weeks if you are having symptoms with your back or shoulder so I can send you to PT for eval and treatment.     IF you received an x-ray today, you will receive an invoice from [facility]. Please contact [facility] at [phone] with questions or concerns regarding your invoice.   IF you received labwork today, you will receive an invoice from [employer]. Please contact [facility] at [phone] with questions or concerns regarding your invoice.   Our billing staff will not be able to assist you with questions regarding bills from these companies.  You will be contacted with the lab results as soon as they are available. The fastest way to get your results is to activate your My Chart account. Instructions are located on the last page of this paperwork. If you have not heard from [place] regarding the results in 2 weeks, please contact this office.

## 2015-11-30 NOTE — Progress Notes (Signed)
11/30/2015 3:46 PM   DOB: 1955/12/01 / MRN: [id number]  SUBJECTIVE:  John Mcclain is a 60 y.o. male presenting for right shoulder pain and right sided back pain.  Neither problem is new.  Reports both have been flaring for about the last month.  He describes the shoulder pain as sharp and positional, and feels the pain in the posterior aspect of the shoulder.  The pain is worse with lateral abduction.  He describes the back pain as a dull ache about the right lower lumbar area. He has had rads in that past that showed DJD and has been having numbness in the bilateral anterior thighs 'for years now." Denies difficulty with urination and defecation.  He denies neuromuscular weakness. He has tried 5 mg of prednisone, of which he has a prescription for, and says this helped.  He has also tried flexeril and this has helped with both his shoulder and back.     He is allergic to penicillins.   He  has a past medical history of Arthritis; Depression; Diabetes mellitus without complication ([place]); Anxiety; Heart murmur; Hypertension; Chronic migraine; Raynaud's disease; and Kidney stones.    He  reports that he has never smoked. He has never used smokeless tobacco. He reports that he drinks alcohol. He reports that he does not use illicit drugs. He  reports that he currently engages in sexual activity and has had male partners. The patient  has past surgical history that includes Colon surgery; Vasectomy; and Lymph node dissection.  His family history includes Alzheimer's disease in his paternal grandmother; Arthritis in his mother; COPD in his father and mother; Diabetes in his father; Heart attack in his father; Hyperlipidemia in his mother; Hypertension in his father and mother; Kidney disease in his mother; Migraines in his brother; Stroke in his maternal grandfather.  Review of Systems  Constitutional: Negative for fever and chills.  Eyes: Negative for blurred vision.  Respiratory: Negative for cough  and shortness of breath.   Cardiovascular: Negative for chest pain.  Gastrointestinal: Negative for nausea and abdominal pain.  Genitourinary: Negative for dysuria, urgency and frequency.  Musculoskeletal: Positive for myalgias. Negative for back pain.  Skin: Negative for rash.  Neurological: Negative for dizziness, tingling and headaches.  Psychiatric/Behavioral: Negative for depression. The patient is not nervous/anxious.     Problem list and medications reviewed and updated by myself where necessary, and exist elsewhere in the encounter.   OBJECTIVE:  BP 124/80 mmHg  Pulse 99  Temp(Src) 98.4 F (36.9 C) (Oral)  Resp 18  Ht 5\' 6"  (1.676 m)  Wt 168 lb (76.204 kg)  BMI 27.13 kg/m2  SpO2 99%  Physical Exam  Constitutional: Vital signs are normal.  Cardiovascular: Normal rate and regular rhythm.   Pulmonary/Chest: Effort normal and breath sounds normal.  Abdominal: Soft. Bowel sounds are normal.  Musculoskeletal:       Lumbar back: He exhibits normal range of motion, no tenderness, no bony tenderness, no swelling and no pain.       Arms: Neurological: He has normal strength. No cranial nerve deficit or sensory deficit. He displays a negative Romberg sign. Gait normal. GCS eye subscore is 4. GCS verbal subscore is 5. GCS motor subscore is 6.  Reflex Scores:      Patellar reflexes are 2+ on the right side and 2+ on the left side.      Achilles reflexes are 2+ on the right side and 2+ on the left side. Skin: Skin is  warm and dry.   Lab Results  Component Value Date   ALT 68* 09/01/2015   AST 68* 09/01/2015   ALKPHOS 40 09/01/2015   BILITOT 0.8 09/01/2015     No results found for this or any previous visit (from the past 72 hour(s)).  No results found.  ASSESSMENT AND PLAN  W[place] was seen today for follow-up, medicare changes and shoulder pain.  Diagnoses and all orders for this visit:  Elevated LFTs -     COMPLETE METABOLIC PANEL WITH GFR  Right-sided low back  pain without sciatica: Will try a burst of pred for his MSK complaints, however I'd like him to see a PT as these issues are both chronic in nature and probably arise from altered joint kinematics.  He will try the pred and get back with me in two weeks if he is having a symptoms so I can get him to PT.  -     predniSONE (DELTASONE) 20 MG tablet; Take 1 tablet (20 mg total) by mouth daily with breakfast.  Shoulder tendonitis, right -     predniSONE (DELTASONE) 20 MG tablet; Take 1 tablet (20 mg total) by mouth daily with breakfast.    The patient was advised to call or return to clinic if he does not see an improvement in symptoms or to seek the care of the closest emergency department if he worsens with the above plan.   [name], MHS, PA-C [facility] 11/30/2015 3:46 PM

## 2015-12-01 LAB — COMPLETE METABOLIC PANEL WITH GFR
ALT: 20 U/L (ref 9–46)
AST: 26 U/L (ref 10–35)
Albumin: 4.4 g/dL (ref 3.6–5.1)
Alkaline Phosphatase: 37 U/L — ABNORMAL LOW (ref 40–115)
BUN: 16 mg/dL (ref 7–25)
CO2: 27 mmol/L (ref 20–31)
Calcium: 9.3 mg/dL (ref 8.6–10.3)
Chloride: 96 mmol/L — ABNORMAL LOW (ref 98–110)
Creat: 1.07 mg/dL (ref 0.70–1.25)
GFR, Est African American: 86 mL/min (ref >=60)
GFR, Est Non African American: 74 mL/min (ref >=60)
Glucose, Bld: 86 mg/dL (ref 65–99)
Potassium: 4.9 mmol/L (ref 3.5–5.3)
Sodium: 137 mmol/L (ref 135–146)
Total Bilirubin: 0.7 mg/dL (ref 0.2–1.2)
Total Protein: 7.1 g/dL (ref 6.1–8.1)

## 2015-12-29 ENCOUNTER — Other Ambulatory Visit: Payer: Self-pay | Admitting: Physician Assistant

## 2015-12-29 DIAGNOSIS — Z79899 Other long term (current) drug therapy: Secondary | ICD-10-CM

## 2015-12-29 DIAGNOSIS — K4020 Bilateral inguinal hernia, without obstruction or gangrene, not specified as recurrent: Secondary | ICD-10-CM

## 2016-01-20 ENCOUNTER — Other Ambulatory Visit: Payer: Self-pay | Admitting: Family Medicine

## 2016-02-10 ENCOUNTER — Other Ambulatory Visit: Payer: Self-pay | Admitting: Physician Assistant

## 2016-02-10 DIAGNOSIS — Z79899 Other long term (current) drug therapy: Secondary | ICD-10-CM

## 2016-03-10 ENCOUNTER — Other Ambulatory Visit: Payer: Self-pay | Admitting: Physician Assistant

## 2016-03-14 ENCOUNTER — Other Ambulatory Visit: Payer: Self-pay

## 2016-03-14 MED ORDER — LISINOPRIL 10 MG PO TABS: 10.0000 mg | ORAL_TABLET | Freq: Every day | ORAL | 0 refills | Status: DC

## 2016-03-24 ENCOUNTER — Ambulatory Visit (INDEPENDENT_AMBULATORY_CARE_PROVIDER_SITE_OTHER): Payer: Commercial Managed Care - HMO

## 2016-03-24 ENCOUNTER — Ambulatory Visit (INDEPENDENT_AMBULATORY_CARE_PROVIDER_SITE_OTHER): Payer: Commercial Managed Care - HMO | Admitting: Physician Assistant

## 2016-03-24 VITALS — BP 124/80 | HR 71 | Temp 98.1°F | Resp 17 | Ht 66.0 in | Wt 167.0 lb

## 2016-03-24 DIAGNOSIS — J3489 Other specified disorders of nose and nasal sinuses: Secondary | ICD-10-CM

## 2016-03-24 DIAGNOSIS — M5442 Lumbago with sciatica, left side: Secondary | ICD-10-CM | POA: Diagnosis not present

## 2016-03-24 DIAGNOSIS — Z79899 Other long term (current) drug therapy: Secondary | ICD-10-CM | POA: Diagnosis not present

## 2016-03-24 DIAGNOSIS — G8929 Other chronic pain: Secondary | ICD-10-CM

## 2016-03-24 DIAGNOSIS — K4020 Bilateral inguinal hernia, without obstruction or gangrene, not specified as recurrent: Secondary | ICD-10-CM

## 2016-03-24 MED ORDER — AMOXICILLIN-POT CLAVULANATE 875-125 MG PO TABS: 1.0000 | ORAL_TABLET | Freq: Two times a day (BID) | ORAL | 0 refills | Status: DC

## 2016-03-24 MED ORDER — LORAZEPAM 1 MG PO TABS: ORAL_TABLET | ORAL | 0 refills | Status: DC

## 2016-03-24 MED ORDER — PREDNISONE 20 MG PO TABS: ORAL_TABLET | ORAL | 0 refills | Status: AC

## 2016-03-24 NOTE — Progress Notes (Signed)
Patient ID: John Mcclain, male   DOB: 1955/09/21, 60 y.o.   MRN: [id number]   By signing my name below, I, Essence Howell, attest that this documentation has been prepared under the direction and in the presence of [name], PA-C Electronically Signed: Ladene Artist, ED Scribe 03/24/2016 at 12:22 PM.  03/24/2016 1:47 PM   DOB: [birth year] / MRN: [id number]  SUBJECTIVE:  John Mcclain is a 60 y.o. male presenting for low back pain. Pt had an abdominal CT scan from 05/24/15 which had comments with regards to degenerative changes to lumbar spine. Pt has a h/o of DM. His last A1C was 5.6. I did stop his DM medication because his A1C has been so well controlled. H/o rheumatoid arthritis which is treated with methotrexate. H/o chronic HAs which is managed by neurology.   Pt states that he typically experiences low right back pain but he has noticed low left back pain that radiates into the left hip over the past week. He denies weakness in lower extremities, loss of sensation, bladder/bowel incontinence. Pt denies h/o tobacco use. He states that RA and HAs have improved. Pt was recently switched from Depakote to [place] and tried injections with his last one being 2 days ago. He did follow-up with a surgeon for bilateral hernias who did not advise surgery at this time unless pain increases.   Pt also presents with a tender "knot" to the right upper gum for over 2 months. He has noticed intermittent swelling surrounding the area over the past 2 months as well. Pt denies dental pain and any sinus-related symptoms. He has used Nasacort without relief. Pt has had the area evaluated by his neurologist and was advised to have the area reevaluated by his PCP.   He is allergic to penicillins.   He  has a past medical history of Anxiety; Arthritis; Chronic migraine; Depression; Diabetes mellitus without complication ([place]); Heart murmur; Hypertension; Kidney stones; and Raynaud's disease.    He  reports that he  has never smoked. He has never used smokeless tobacco. He reports that he drinks alcohol. He reports that he does not use drugs. He  reports that he currently engages in sexual activity and has had male partners. The patient  has a past surgical history that includes Colon surgery; Vasectomy; and Lymph node dissection.  His family history includes Alzheimer's disease in his paternal grandmother; Arthritis in his mother; COPD in his father and mother; Diabetes in his father; Heart attack in his father; Hyperlipidemia in his mother; Hypertension in his father and mother; Kidney disease in his mother; Migraines in his brother; Stroke in his maternal grandfather.  Review of Systems  Musculoskeletal: Positive for back pain.  Neurological: Negative for focal weakness.    The problem list and medications were reviewed and updated by myself where necessary and exist elsewhere in the encounter.   OBJECTIVE:  BP 124/80 (BP Location: Right Arm, Patient Position: Sitting, Cuff Size: Normal)   Pulse 71   Temp 98.1 F (36.7 C) (Oral)   Resp 17   Ht 5\' 6"  (1.676 m)   Wt 167 lb (75.8 kg)   SpO2 98%   BMI 26.95 kg/m   Physical Exam  Constitutional: He appears well-developed and well-nourished.  Cardiovascular: Normal rate.   Pulmonary/Chest: Effort normal.  Musculoskeletal:  Muscle spasm over the L upper sacrum area. No rash.   Neurological:  Reflex Scores:      Achilles reflexes are 2+ on the right side and 1+ on  the left side. Reflexes 2+ UE  Skin: Skin is warm and dry.  Psychiatric: He has a normal mood and affect.    No results found for this or any previous visit (from the past 72 hour(s)).  Dg Sinus 1-2 Views  Result Date: 03/24/2016 CLINICAL DATA:  Left maxillary sinus pain to palpation. EXAM: PARANASAL SINUSES - 1-2 VIEW COMPARISON:  None. FINDINGS: Frontal view of the sinuses shows no mucosal thickening or air-fluid level in the maxillary sinuses. The frontal sinuses are clear.  Mastoid air cells are aerated bilaterally. IMPRESSION: No evidence for maxillary sinus disease by x-ray. Electronically Signed   By: Misty Stanley M.D.   On: 03/24/2016 13:23   Dg Lumbar Spine Complete  Result Date: 03/24/2016 CLINICAL DATA:  Long history of back pain with new left sided lumbar worsening. History of RA controlled on biologics. EXAM: LUMBAR SPINE - COMPLETE 4+ VIEW COMPARISON:  Plain film of the lumbar spine dated 09/03/2010. FINDINGS: There is approximately 13 degrees dextroscoliosis centered at the L3 vertebral body level, not significantly changed compared to the previous exam. Degenerative change again noted within the upper and lower lumbar spine, mild to moderate in degree with associated disc space narrowings and osseous spurring, also not significantly changed. No acute or suspicious osseous finding. Upper sacrum appears intact and normally aligned. Visualized paravertebral soft tissues are unremarkable. IMPRESSION: 1. No acute findings. 2. Scoliosis and degenerative changes as detailed above. Electronically Signed   By: Franki Cabot M.D.   On: 03/24/2016 13:23    ASSESSMENT AND PLAN  Elfego was seen today for back pain.  Diagnoses and all orders for this visit:  Chronic left-sided low back pain with left-sided sciatica: No new. He has responded well to pred in the past.   -     DG Lumbar Spine Complete; Future -     predniSONE (DELTASONE) 20 MG tablet; Take 3 in the morning for 3 days, then 2 in the morning for 3 days, and then 1 in the morning for 3 days.  Sinus pain: Rads negative.  Question a possible dental etiology.  Will cover with augmentin.  -     DG SinUS 1-2 Views; Future -     amoxicillin-clavulanate (AUGMENTIN) 875-125 MG tablet; Take 1 tablet by mouth 2 (two) times daily.  Medication management: He is not taking any other controlled substances and is using very little of this medication.  I have refilled. -     LORazepam (ATIVAN) 1 MG tablet; TAKE  ONE-HALF TO ONE TABLET BY MOUTH DAILY AS NEEDED.  Bilateral inguinal hernia without obstruction or gangrene, recurrence not specified -     LORazepam (ATIVAN) 1 MG tablet; TAKE ONE-HALF TO ONE TABLET BY MOUTH DAILY AS NEEDED.   This note was scribed in my presence and I performed the services described in the this documentation.    The patient is advised to call or return to clinic if he does not see an improvement in symptoms, or to seek the care of the closest emergency department if he worsens with the above plan.   [name], MHS, PA-C [facility] 03/24/2016 1:47 PM

## 2016-03-24 NOTE — Patient Instructions (Signed)
     IF you received an x-ray today, you will receive an invoice from [facility]. Please contact [facility] at [phone] with questions or concerns regarding your invoice.   IF you received labwork today, you will receive an invoice from [facility]. Please contact [facility] at [phone] with questions or concerns regarding your invoice.   Our billing staff will not be able to assist you with questions regarding bills from these companies.  You will be contacted with the lab results as soon as they are available. The fastest way to get your results is to activate your My Chart account. Instructions are located on the last page of this paperwork. If you have not heard from us regarding the results in 2 weeks, please contact this office.      

## 2016-04-10 ENCOUNTER — Other Ambulatory Visit: Payer: Self-pay | Admitting: Physician Assistant

## 2016-04-10 DIAGNOSIS — Z79899 Other long term (current) drug therapy: Secondary | ICD-10-CM

## 2016-04-12 ENCOUNTER — Other Ambulatory Visit: Payer: Self-pay

## 2016-04-12 DIAGNOSIS — Z79899 Other long term (current) drug therapy: Secondary | ICD-10-CM

## 2016-04-12 MED ORDER — POLYETHYLENE GLYCOL 3350 17 GM/SCOOP PO POWD
17.0000 g | Freq: Every day | ORAL | 1 refills | Status: DC
Start: 2016-04-12 — End: 2017-05-07

## 2016-04-12 NOTE — Telephone Encounter (Signed)
Pt. Called needing a refill of his miralax today.he is out x 2 weeks waiting for a refill per pt. 03/24/16 last ov

## 2016-05-05 ENCOUNTER — Telehealth: Payer: Self-pay

## 2016-05-05 NOTE — Telephone Encounter (Signed)
Patient states that he is having his Psychologist, clinical over paperwork for his medications for [place] to fill out.  Please be on the lookout for those faxes  Patient phone:[phone]

## 2016-05-06 ENCOUNTER — Telehealth: Payer: Self-pay

## 2016-05-06 NOTE — Telephone Encounter (Signed)
To you FYI, pt is sending you forms from his insurance company

## 2016-05-06 NOTE — Telephone Encounter (Signed)
Fax request [place] wants clarification of directions. Completed and faxed.

## 2016-05-06 NOTE — Telephone Encounter (Signed)
Fax request for Amlodipine, lisinopril, miralax & rosuvastatin is what was sent.  Fax request for Lorazepam form put in Michael's box. Appt 05/11/2016

## 2016-05-11 ENCOUNTER — Ambulatory Visit (INDEPENDENT_AMBULATORY_CARE_PROVIDER_SITE_OTHER): Payer: Commercial Managed Care - HMO | Admitting: Physician Assistant

## 2016-05-11 ENCOUNTER — Encounter: Payer: Self-pay | Admitting: Physician Assistant

## 2016-05-11 ENCOUNTER — Other Ambulatory Visit: Payer: Self-pay | Admitting: Family Medicine

## 2016-05-11 VITALS — BP 130/82 | HR 99 | Temp 97.8°F | Resp 16 | Ht 65.5 in | Wt 168.8 lb

## 2016-05-11 DIAGNOSIS — Z1329 Encounter for screening for other suspected endocrine disorder: Secondary | ICD-10-CM

## 2016-05-11 DIAGNOSIS — Z0000 Encounter for general adult medical examination without abnormal findings: Secondary | ICD-10-CM

## 2016-05-11 DIAGNOSIS — Z760 Encounter for issue of repeat prescription: Secondary | ICD-10-CM | POA: Diagnosis not present

## 2016-05-11 DIAGNOSIS — K121 Other forms of stomatitis: Secondary | ICD-10-CM

## 2016-05-11 DIAGNOSIS — Z130 Encounter for screening for diseases of the blood and blood-forming organs and certain disorders involving the immune mechanism: Secondary | ICD-10-CM

## 2016-05-11 DIAGNOSIS — Z23 Encounter for immunization: Secondary | ICD-10-CM

## 2016-05-11 DIAGNOSIS — Z13220 Encounter for screening for lipoid disorders: Secondary | ICD-10-CM

## 2016-05-11 DIAGNOSIS — Z131 Encounter for screening for diabetes mellitus: Secondary | ICD-10-CM

## 2016-05-11 DIAGNOSIS — Z1389 Encounter for screening for other disorder: Secondary | ICD-10-CM

## 2016-05-11 MED ORDER — VALACYCLOVIR HCL 1 G PO TABS: 2000.0000 mg | ORAL_TABLET | Freq: Two times a day (BID) | ORAL | 6 refills | Status: DC

## 2016-05-11 NOTE — Patient Instructions (Signed)
     IF you received an x-ray today, you will receive an invoice from [facility]. Please contact [facility] at [phone] with questions or concerns regarding your invoice.   IF you received labwork today, you will receive an invoice from [facility]. Please contact [facility] at [phone] with questions or concerns regarding your invoice.   Our billing staff will not be able to assist you with questions regarding bills from these companies.  You will be contacted with the lab results as soon as they are available. The fastest way to get your results is to activate your My Chart account. Instructions are located on the last page of this paperwork. If you have not heard from us regarding the results in 2 weeks, please contact this office.      

## 2016-05-11 NOTE — Progress Notes (Signed)
05/11/2016 2:33 PM   DOB: 05/13/1956 / MRN: [id number]  SUBJECTIVE:  John Mcclain is a 60 y.o. male presenting for annual physical. Work in the yard 2-3 hours at a time about once per week. He is a never smoker.   Sees Dr. Domingo Cocking for HA and would like his CMP sent to this provider.  Seeing Dr. Charlestine Night in rheum and would also like his blood work results sent there as well.  Depression screen PHQ 2/9 05/11/2016  Decreased Interest 0  Down, Depressed, Hopeless 0  PHQ - 2 Score 0     Immunization History  Administered Date(s) Administered  . Influenza,inj,Quad PF,36+ Mos 05/27/2014, 02/24/2015, 05/11/2016  . Pneumococcal Conjugate-13 02/24/2015  . Pneumococcal Polysaccharide-23 12/13/2009  . Tdap 12/13/2009    He is allergic to penicillins.   He  has a past medical history of Anxiety; Arthritis; Chronic migraine; Depression; Diabetes mellitus without complication ([place]); Heart murmur; Hypertension; Kidney stones; and Raynaud's disease.    He  reports that he has never smoked. He has never used smokeless tobacco. He reports that he drinks alcohol. He reports that he does not use drugs. He  reports that he currently engages in sexual activity and has had male partners. The patient  has a past surgical history that includes Colon surgery; Vasectomy; and Lymph node dissection.  His family history includes Alzheimer's disease in his paternal grandmother; Arthritis in his mother; COPD in his father and mother; Diabetes in his father; Heart attack in his father; Hyperlipidemia in his mother; Hypertension in his father and mother; Kidney disease in his mother; Migraines in his brother; Stroke in his maternal grandfather.  Review of Systems  Constitutional: Negative for fever.  Respiratory: Negative for shortness of breath.   Cardiovascular: Negative for chest pain and leg swelling.  Skin: Negative for rash.  Neurological: Negative for dizziness.    The problem list and medications were  reviewed and updated by myself where necessary and exist elsewhere in the encounter.   OBJECTIVE:  BP 130/82 (BP Location: Left Arm, Patient Position: Sitting, Cuff Size: Normal)   Pulse 99   Temp 97.8 F (36.6 C) (Oral)   Resp 16   Ht 5' 5.5" (1.664 m)   Wt 168 lb 12.8 oz (76.6 kg)   SpO2 99%   BMI 27.66 kg/m   Physical Exam  Constitutional: He is oriented to person, place, and time.  Cardiovascular: Normal rate and regular rhythm.   Pulmonary/Chest: Effort normal and breath sounds normal.  Abdominal: Soft. Bowel sounds are normal.  Sliding umbilical hernia noted and not tender.   Musculoskeletal: Normal range of motion.  Neurological: He is alert and oriented to person, place, and time.  Skin: Skin is warm and dry.      No results found for this or any previous visit (from the past 72 hour(s)).  No results found.  ASSESSMENT AND PLAN  John Mcclain was seen today for annual exam and medication refill.  Diagnoses and all orders for this visit:  Physical exam  Screening for deficiency anemia -     CBC  Screening for nephropathy -     CMP14+EGFR  Screening for lipid disorders -     Lipid panel  Screening for thyroid disorder -     TSH  Screening for diabetes mellitus -     Hemoglobin A1c  Need for prophylactic vaccination and inoculation against influenza -     Flu Vaccine QUAD 36+ mos IM  Medication refill  Mouth  ulcers -     valACYclovir (VALTREX) 1000 MG tablet; Take 2 tablets (2,000 mg total) by mouth 2 (two) times daily.  Need for Zostavax administration: Contraindicated given methotrexate admin.     The patient is advised to call or return to clinic if he does not see an improvement in symptoms, or to seek the care of the closest emergency department if he worsens with the above plan.   [name], MHS, PA-C [facility] 05/11/2016 2:33 PM

## 2016-05-13 LAB — CBC
Hematocrit: 49.2 % (ref 37.5–51.0)
Hemoglobin: 16.2 g/dL (ref 13.0–17.7)
MCH: 35.6 pg — ABNORMAL HIGH (ref 26.6–33.0)
MCHC: 32.9 g/dL (ref 31.5–35.7)
MCV: 108 fL — ABNORMAL HIGH (ref 79–97)
Platelets: 254 10*3/uL (ref 150–379)
RBC: 4.55 x10E6/uL (ref 4.14–5.80)
RDW: 15.3 % (ref 12.3–15.4)
WBC: 6.6 10*3/uL (ref 3.4–10.8)

## 2016-05-13 LAB — CMP14+EGFR
ALT: 47 IU/L — ABNORMAL HIGH (ref 0–44)
AST: 51 IU/L — ABNORMAL HIGH (ref 0–40)
Albumin/Globulin Ratio: 2 (ref 1.2–2.2)
Albumin: 4.7 g/dL (ref 3.6–4.8)
Alkaline Phosphatase: 44 IU/L (ref 39–117)
BUN/Creatinine Ratio: 9 — ABNORMAL LOW (ref 10–24)
BUN: 10 mg/dL (ref 8–27)
Bilirubin Total: 0.7 mg/dL (ref 0.0–1.2)
CO2: 25 mmol/L (ref 18–29)
Calcium: 9.4 mg/dL (ref 8.6–10.2)
Chloride: 99 mmol/L (ref 96–106)
Creatinine, Ser: 1.08 mg/dL (ref 0.76–1.27)
GFR calc Af Amer: 85 mL/min/{1.73_m2} (ref >59)
GFR calc non Af Amer: 73 mL/min/{1.73_m2} (ref >59)
Globulin, Total: 2.4 g/dL (ref 1.5–4.5)
Glucose: 104 mg/dL — ABNORMAL HIGH (ref 65–99)
Potassium: 4.7 mmol/L (ref 3.5–5.2)
Sodium: 140 mmol/L (ref 134–144)
Total Protein: 7.1 g/dL (ref 6.0–8.5)

## 2016-05-13 LAB — LIPID PANEL
Chol/HDL Ratio: 3 ratio units (ref 0.0–5.0)
Cholesterol, Total: 177 mg/dL (ref 100–199)
HDL: 59 mg/dL (ref >39)
LDL Calculated: 84 mg/dL (ref 0–99)
Triglycerides: 168 mg/dL — ABNORMAL HIGH (ref 0–149)
VLDL Cholesterol Cal: 34 mg/dL (ref 5–40)

## 2016-05-13 LAB — TSH: TSH: 0.868 u[IU]/mL (ref 0.450–4.500)

## 2016-05-13 LAB — HEMOGLOBIN A1C
Est. average glucose Bld gHb Est-mCnc: 126 mg/dL
Hgb A1c MFr Bld: 6 % — ABNORMAL HIGH (ref 4.8–5.6)

## 2016-05-15 ENCOUNTER — Other Ambulatory Visit: Payer: Self-pay | Admitting: Physician Assistant

## 2016-05-15 DIAGNOSIS — Z299 Encounter for prophylactic measures, unspecified: Secondary | ICD-10-CM

## 2016-05-16 ENCOUNTER — Other Ambulatory Visit: Payer: Self-pay | Admitting: *Deleted

## 2016-05-16 DIAGNOSIS — Z299 Encounter for prophylactic measures, unspecified: Secondary | ICD-10-CM

## 2016-05-16 MED ORDER — ROSUVASTATIN CALCIUM 5 MG PO TABS: 5.0000 mg | ORAL_TABLET | Freq: Every day | ORAL | 0 refills | Status: DC

## 2016-05-18 NOTE — Progress Notes (Signed)
He has a progressive macrocytosis.  We need to add on a peripheral smear, folate and B12.  He will need to return for a blood draw only.  [name], MS, PA-C 3:09 PM, 05/18/2016

## 2016-06-19 ENCOUNTER — Other Ambulatory Visit: Payer: Self-pay

## 2016-06-19 DIAGNOSIS — M0570 Rheumatoid arthritis with rheumatoid factor of unspecified site without organ or systems involvement: Secondary | ICD-10-CM | POA: Diagnosis not present

## 2016-06-19 DIAGNOSIS — G43019 Migraine without aura, intractable, without status migrainosus: Secondary | ICD-10-CM | POA: Diagnosis not present

## 2016-06-19 DIAGNOSIS — K4020 Bilateral inguinal hernia, without obstruction or gangrene, not specified as recurrent: Secondary | ICD-10-CM

## 2016-06-19 DIAGNOSIS — Z79899 Other long term (current) drug therapy: Secondary | ICD-10-CM

## 2016-06-19 DIAGNOSIS — G43719 Chronic migraine without aura, intractable, without status migrainosus: Secondary | ICD-10-CM | POA: Diagnosis not present

## 2016-06-19 NOTE — Telephone Encounter (Signed)
Pt has recevied a letter from his [employer]  About getting a refill on Lorazepam, the letter states that they have not heard back fom [place] about refilling this and  We call eprescribe  Fax to [phone] or call by phone [phone]   Please call pt when this has been completed   Best number [phone]

## 2016-06-20 NOTE — Telephone Encounter (Signed)
Last seen 05/11/16 Last refill 03/2016 x 3 months

## 2016-06-26 MED ORDER — LORAZEPAM 1 MG PO TABS: ORAL_TABLET | ORAL | 0 refills | Status: DC

## 2016-06-26 NOTE — Telephone Encounter (Signed)
Faxed to pharmacy

## 2016-07-06 DIAGNOSIS — Z79899 Other long term (current) drug therapy: Secondary | ICD-10-CM | POA: Diagnosis not present

## 2016-07-06 DIAGNOSIS — M25431 Effusion, right wrist: Secondary | ICD-10-CM | POA: Diagnosis not present

## 2016-07-06 DIAGNOSIS — R7989 Other specified abnormal findings of blood chemistry: Secondary | ICD-10-CM | POA: Diagnosis not present

## 2016-07-06 DIAGNOSIS — M0609 Rheumatoid arthritis without rheumatoid factor, multiple sites: Secondary | ICD-10-CM | POA: Diagnosis not present

## 2016-08-17 DIAGNOSIS — R7989 Other specified abnormal findings of blood chemistry: Secondary | ICD-10-CM | POA: Diagnosis not present

## 2016-08-17 DIAGNOSIS — M0609 Rheumatoid arthritis without rheumatoid factor, multiple sites: Secondary | ICD-10-CM | POA: Diagnosis not present

## 2016-08-17 DIAGNOSIS — Z79899 Other long term (current) drug therapy: Secondary | ICD-10-CM | POA: Diagnosis not present

## 2016-08-18 ENCOUNTER — Other Ambulatory Visit: Payer: Self-pay

## 2016-08-18 MED ORDER — LISINOPRIL 10 MG PO TABS: 10.0000 mg | ORAL_TABLET | Freq: Every day | ORAL | 0 refills | Status: DC

## 2016-08-18 NOTE — Telephone Encounter (Signed)
Fax req [facility] Lisinopril 10mg  Due for recheck 44mo-June 2018 Sent #[phone] with -0-RF

## 2016-09-02 ENCOUNTER — Other Ambulatory Visit: Payer: Self-pay | Admitting: *Deleted

## 2016-09-02 DIAGNOSIS — I10 Essential (primary) hypertension: Secondary | ICD-10-CM

## 2016-09-02 MED ORDER — AMLODIPINE BESYLATE 2.5 MG PO TABS: 2.5000 mg | ORAL_TABLET | Freq: Every day | ORAL | 0 refills | Status: DC

## 2016-09-09 ENCOUNTER — Other Ambulatory Visit: Payer: Self-pay

## 2016-09-09 DIAGNOSIS — K4020 Bilateral inguinal hernia, without obstruction or gangrene, not specified as recurrent: Secondary | ICD-10-CM

## 2016-09-09 DIAGNOSIS — Z79899 Other long term (current) drug therapy: Secondary | ICD-10-CM

## 2016-09-09 NOTE — Telephone Encounter (Signed)
Fax req [facility] Lorazepam 1mg 

## 2016-09-13 MED ORDER — LORAZEPAM 1 MG PO TABS: ORAL_TABLET | ORAL | 0 refills | Status: DC

## 2016-09-18 DIAGNOSIS — G43019 Migraine without aura, intractable, without status migrainosus: Secondary | ICD-10-CM | POA: Diagnosis not present

## 2016-09-18 DIAGNOSIS — G43719 Chronic migraine without aura, intractable, without status migrainosus: Secondary | ICD-10-CM | POA: Diagnosis not present

## 2016-10-25 ENCOUNTER — Other Ambulatory Visit: Payer: Self-pay | Admitting: Physician Assistant

## 2016-11-06 ENCOUNTER — Encounter: Payer: Self-pay | Admitting: Physician Assistant

## 2016-11-06 ENCOUNTER — Ambulatory Visit (INDEPENDENT_AMBULATORY_CARE_PROVIDER_SITE_OTHER): Payer: Medicare PPO | Admitting: Physician Assistant

## 2016-11-06 VITALS — BP 138/88 | HR 93 | Temp 98.0°F | Resp 16 | Ht 65.5 in | Wt 170.8 lb

## 2016-11-06 DIAGNOSIS — R61 Generalized hyperhidrosis: Secondary | ICD-10-CM

## 2016-11-06 DIAGNOSIS — Z0000 Encounter for general adult medical examination without abnormal findings: Secondary | ICD-10-CM

## 2016-11-06 NOTE — Progress Notes (Signed)
Presents today for Medicare Visit.   Interpreter used for this visit? no  Other items to address today: Night sweats. Occurring 2-3 times a week and this is new for him. Tells me that one time per month he may sweat through his shirt.  No need to change the sheets however. Thinks this may be 2/2 to the warmth in the room and warm whether outside. Denies fevers and weight loss.    Cancer Screening: Colon (every 10 years - ages 2-75): Last was was 2011. Hung recommended 5 year follow up. Patient wants to wait until 2021.  Prostate (annually - ages > 76): no, patient delcines   Other screening:  [place] for AAA (males 25-75 - smoking history - once): Never smoker ETOH use: A few drinks on the weekends.  Dental visits: Yes, status post recent crown.  Vision visits: Sees opthalmology yearly in [place].  Typical Meals: Varies. Does drink full sugar soda but is willing to try carbonated water.  Trys to avoid sweets, fast food.   Typical Beverages: Soda and water with lemon, occasional beer.  Exercise: He does not exercise formally.   Lab Screening: Last screening for diabetes (annually): Declines A1c today.  Checks his sugar at home and runs 130 non fasting.  Last lipid screening (every 5 years): Yes   ADVANCE DIRECTIVES: Discussed: yes On File: no Materials Provided: yes  Depression screen [facility] 2/9 11/06/2016 05/11/2016 03/24/2016 11/30/2015 09/28/2015  Decreased Interest 0 0 0 0 0  Down, Depressed, Hopeless 0 0 0 0 0  PHQ - 2 Score 0 0 0 0 0    Fall Risk  11/06/2016 05/11/2016 03/24/2016 11/30/2015 09/28/2015  Falls in the past year? No No No No No  Number falls in past yr: - - - - -    Functional Status Survey: Is the patient deaf or have difficulty hearing?: No (per patient, has intermittant "buzzing" in his ears) Does the patient have difficulty seeing, even when wearing glasses/contacts?: No Does the patient have difficulty concentrating, remembering, or making decisions?:  No Does the patient have difficulty walking or climbing stairs?: No Does the patient have difficulty dressing or bathing?: No Does the patient have difficulty doing errands alone such as visiting a doctor's office or shopping?: No   Immunization status:  Immunization History  Administered Date(s) Administered  . Influenza,inj,Quad PF,36+ Mos 05/27/2014, 02/24/2015, 05/11/2016  . Pneumococcal Conjugate-13 02/24/2015  . Pneumococcal Polysaccharide-23 12/13/2009  . Tdap 12/13/2009    Health Maintenance Due  Topic Date Due  . PNEUMOCOCCAL POLYSACCHARIDE VACCINE (2) 12/14/2014  . FOOT EXAM  02/24/2016  . OPHTHALMOLOGY EXAM  04/05/2016    Patient Care Team: Hillis Range as PCP - General (Urgent Care)   Patient Active Problem List   Diagnosis Date Noted  . Hemorrhoids, thrombosed 02/06/2013  . Hypogonadism male 12/26/2011  . Vitamin D deficiency 12/26/2011  . Rheumatoid arthritis(714.0) 12/26/2011  . Migraine headache disorder 12/26/2011  . Anxiety disorder 12/26/2011  . HTN (hypertension), benign 12/26/2011     Past Medical History:  Diagnosis Date  . Anxiety   . Arthritis   . Chronic migraine   . Depression   . Diabetes mellitus without complication ([place])   . Heart murmur   . Hypertension   . Kidney stones   . Raynaud's disease      Past Surgical History:  Procedure Laterality Date  . COLON SURGERY    . LYMPH NODE DISSECTION     removed lymph  node  . VASECTOMY       Family History  Problem Relation Age of Onset  . Kidney disease Mother   . Hypertension Mother   . Arthritis Mother        RA  . COPD Mother   . Hyperlipidemia Mother   . Heart attack Father   . Diabetes Father   . COPD Father   . Hypertension Father   . Migraines Brother   . Stroke Maternal Grandfather   . Alzheimer's disease Paternal Grandmother      Social History   Social History  . Marital status: Married    Spouse name: N/A  . Number of children: N/A  . Years  of education: N/A   Occupational History  . Not on file.   Social History Main Topics  . Smoking status: Never Smoker  . Smokeless tobacco: Never Used  . Alcohol use Yes     Comment: 2-3 per week  . Drug use: No  . Sexual activity: Yes    Partners: Female   Other Topics Concern  . Not on file   Social History Narrative   Married. Education: [employer].      Allergies  Allergen Reactions  . Penicillins     Prior to Admission medications   Medication Sig Start Date End Date Taking? Authorizing Provider  amLODipine (NORVASC) 2.5 MG tablet Take 1 tablet (2.5 mg total) by mouth daily. 09/02/16  Yes Tereasa Coop, PA-C  Calcium Carbonate-Vitamin D (CALCIUM 600+D PO) Take by mouth daily.   Yes [provider]  cyclobenzaprine (FLEXERIL) 10 MG tablet Take 10 mg by mouth 3 (three) times daily as needed.   Yes [provider]  etanercept (ENBREL) 50 MG/ML injection Inject 50 mg into the skin once a week. Every Monday   Yes [provider]  folic acid (FOLVITE) 1 MG tablet Take 1 mg by mouth daily.   Yes [provider]  imipramine (TOFRANIL) 25 MG tablet Take 50 mg by mouth 2 (two) times daily.    Yes [provider]  levETIRAcetam (KEPPRA) 750 MG tablet Take 750 mg by mouth 2 (two) times daily.    Yes [provider]  lisinopril (PRINIVIL,ZESTRIL) 10 MG tablet TAKE 1 TABLET EVERY DAY 10/26/16  Yes Tereasa Coop, PA-C  LORazepam (ATIVAN) 1 MG tablet TAKE ONE-HALF TO ONE TABLET BY MOUTH DAILY AS NEEDED. 09/13/16  Yes Tereasa Coop, PA-C  magic mouthwash SOLN Take 5 mLs by mouth 4 (four) times daily. 09/28/15  Yes English, Colletta [place] D, PA  methotrexate (RHEUMATREX) 2.5 MG tablet Take 2.5 mg by mouth once a week. Taking five 21/2 mg tablets every Monday   Yes [provider]  OVER THE COUNTER MEDICATION Reported on 09/01/2015   Yes [provider]  polyethylene glycol powder (GLYCOLAX/MIRALAX) powder Take 17 g by  mouth daily. 04/12/16  Yes Tereasa Coop, PA-C  predniSONE (DELTASONE) 5 MG tablet Take 5 mg by mouth as needed.   Yes [provider]  ranitidine (ZANTAC) 150 MG tablet Take 150 mg by mouth 2 (two) times daily.   Yes [provider]  rosuvastatin (CRESTOR) 5 MG tablet TAKE ONE TABLET BY MOUTH ONCE DAILY 05/16/16  Yes Tereasa Coop, PA-C  sildenafil (REVATIO) 20 MG tablet Take 1 tablet (20 mg total) by mouth 3 (three) times daily. 02/24/15  Yes Tereasa Coop, PA-C  valACYclovir (VALTREX) 1000 MG tablet Take 2 tablets (2,000 mg total) by mouth 2 (  two) times daily. 05/11/16  Yes Tereasa Coop, PA-C  clobetasol ointment (TEMOVATE) 8.52 % Apply 1 application topically 2 (two) times daily. Reported on 09/28/2015    [provider]  divalproex (DEPAKOTE) 250 MG DR tablet Take 500 mg by mouth 2 (two) times daily.     [provider]    ELECTROCARDIOGRAM (once at welcome to medicare) Last 2013 and NSR, normal axis, no hypertrophy or infarction.  Negative for q waves    PHYSICAL EXAM: BP 138/88 (BP Location: Right Arm, Patient Position: Sitting, Cuff Size: Normal)   Pulse 93   Temp 98 F (36.7 C) (Oral)   Resp 16   Ht 5' 5.5" (1.664 m)   Wt 170 lb 12.8 oz (77.5 kg)   SpO2 97%   BMI 27.99 kg/m   Wt Readings from Last 3 Encounters:  11/06/16 170 lb 12.8 oz (77.5 kg)  05/11/16 168 lb 12.8 oz (76.6 kg)  03/24/16 167 lb (75.8 kg)      Visual Acuity Screening   Right eye Left eye Both eyes  Without correction: 20/30 20/20 20/20   With correction:       Physical Exam  Constitutional: He is oriented to person, place, and time. He appears well-developed and well-nourished. No distress.  HENT:  Right Ear: Hearing, tympanic membrane, external ear and ear canal normal.  Left Ear: Hearing, tympanic membrane, external ear and ear canal normal.  Nose: Mucosal edema present. No sinus tenderness. Right sinus exhibits no maxillary sinus tenderness and no  frontal sinus tenderness. Left sinus exhibits no maxillary sinus tenderness and no frontal sinus tenderness.  Mouth/Throat: Uvula is midline and mucous membranes are normal. Mucous membranes are not pale, not dry and not cyanotic. Posterior oropharyngeal erythema present. No oropharyngeal exudate, posterior oropharyngeal edema or tonsillar abscesses.  Cardiovascular: Normal rate and regular rhythm.   Respiratory: Effort normal and breath sounds normal. He has no wheezes. He has no rales.  Musculoskeletal: Normal range of motion.  Lymphadenopathy:       Head (right side): No submental, no submandibular, no tonsillar, no preauricular, no posterior auricular and no occipital adenopathy present.       Head (left side): No submental, no submandibular, no tonsillar, no preauricular, no posterior auricular and no occipital adenopathy present.    He has no cervical adenopathy.       Right cervical: No superficial cervical and no deep cervical adenopathy present.      Left cervical: No superficial cervical, no deep cervical and no posterior cervical adenopathy present.    He has no axillary adenopathy.       Right axillary: No pectoral adenopathy present.       Left axillary: No pectoral adenopathy present.      Right: No inguinal, no supraclavicular and no epitrochlear adenopathy present.       Left: No inguinal, no supraclavicular and no epitrochlear adenopathy present.  Neurological: He is alert and oriented to person, place, and time.  Skin: Skin is warm and dry. He is not diaphoretic.  Psychiatric: He has a normal mood and affect.     Education/Counseling: yes diet and exercise yes prevention of chronic diseases yes smoking/tobacco cessation yes review "Covered Medicare Preventive Services"  Lab Results  Component Value Date   PSA 0.71 05/13/2014   PSA 0.83 12/26/2011   Lab Results  Component Value Date   HGBA1C 6.0 (H) 05/11/2016   Lab Results  Component Value Date   CHOL 177  05/11/2016  HDL 59 05/11/2016   LDLCALC 84 05/11/2016   TRIG 168 (H) 05/11/2016   CHOLHDL 3.0 05/11/2016      Wt Readings from Last 3 Encounters:  11/06/16 170 lb 12.8 oz (77.5 kg)  05/11/16 168 lb 12.8 oz (76.6 kg)  03/24/16 167 lb (75.8 kg)      ASSESSMENT/PLAN:  Medicare wellness visit: He is current on screenings.    Night sweats discussed today.  He has rheumatoid arthritis and this may be the cause. He is not losing weight and overall feels well.  Will screen CBC and inflammatory markers, CMET. I will discuss his finding with him once they are resulted.   The natural history of prostate cancer and ongoing controversy regarding screening and potential treatment outcomes of prostate cancer has been discussed with the patient. The meaning of a false positive PSA and a false negative PSA has been discussed. He indicates understanding of the limitations of this screening test and wishes not to proceed with screening PSA testing.

## 2016-11-06 NOTE — Patient Instructions (Addendum)
Exercise improves every system in the body.  It lowers the risk of heart disease, decreases blood pressure, reduces the symptoms of depression and anxiety, and lowers blood sugar. To receive these benefits, try to get 150 minutes of planned exercise each week.  You can break this 150 minutes up however you like.  For instance, you can perform 30 minutes of brisk walking 5 days a week, or perform 50 minutes 3 days a week.  If you don't like walking, or can't find a safe place to walk, find another way to move that you can enjoy.  Exercise tapes, cycling, stair climbing, swimming, or a combination will be just as good as a walking program. To ensure the proper intensity, you can use the talk test. Essentially, you should be able to carry on a conversation, but you should have to take short breaks from the conversation in order catch your breath.   Check BP every few days and if averaging higher than 140/90. Check BP only after five minutes resting and with your arm at the level of the heart.     IF you received an x-ray today, you will receive an invoice from [facility]. Please contact [facility] at [phone] with questions or concerns regarding your invoice.   IF you received labwork today, you will receive an invoice from [place]. Please contact [facility] at [phone] with questions or concerns regarding your invoice.   Our billing staff will not be able to assist you with questions regarding bills from these companies.  You will be contacted with the lab results as soon as they are available. The fastest way to get your results is to activate your My Chart account. Instructions are located on the last page of this paperwork. If you have not heard from [place] regarding the results in 2 weeks, please contact this office.

## 2016-11-10 NOTE — Progress Notes (Signed)
Please add on a B12 and folate. Thank you Shay. [name], MS, PA-C 2:19 PM, 11/10/2016

## 2016-11-13 LAB — LACTATE DEHYDROGENASE: LDH: 205 IU/L (ref 121–224)

## 2016-11-13 LAB — PATHOLOGIST SMEAR REVIEW
Basophils Absolute: 0.1 10*3/uL (ref 0.0–0.2)
Basos: 1 %
EOS (ABSOLUTE): 0.1 10*3/uL (ref 0.0–0.4)
Eos: 2 %
Hematocrit: 47.7 % (ref 37.5–51.0)
Hemoglobin: 16 g/dL (ref 13.0–17.7)
Immature Grans (Abs): 0.1 10*3/uL (ref 0.0–0.1)
Immature Granulocytes: 1 %
Lymphocytes Absolute: 1.7 10*3/uL (ref 0.7–3.1)
Lymphs: 28 %
MCH: 34.3 pg — ABNORMAL HIGH (ref 26.6–33.0)
MCHC: 33.5 g/dL (ref 31.5–35.7)
MCV: 102 fL — ABNORMAL HIGH (ref 79–97)
Monocytes Absolute: 0.9 10*3/uL (ref 0.1–0.9)
Monocytes: 15 %
Neutrophils Absolute: 3.1 10*3/uL (ref 1.4–7.0)
Neutrophils: 53 %
Path Rev PLTs: NORMAL
Path Rev WBC: NORMAL
Platelets: 244 10*3/uL (ref 150–379)
RBC: 4.67 x10E6/uL (ref 4.14–5.80)
RDW: 16.9 % — ABNORMAL HIGH (ref 12.3–15.4)
WBC: 5.9 10*3/uL (ref 3.4–10.8)

## 2016-11-13 LAB — HEPATIC FUNCTION PANEL
ALT: 38 IU/L (ref 0–44)
AST: 33 IU/L (ref 0–40)
Albumin: 4.9 g/dL — ABNORMAL HIGH (ref 3.6–4.8)
Alkaline Phosphatase: 50 IU/L (ref 39–117)
Bilirubin Total: 0.5 mg/dL (ref 0.0–1.2)
Bilirubin, Direct: 0.18 mg/dL (ref 0.00–0.40)
Total Protein: 7.3 g/dL (ref 6.0–8.5)

## 2016-11-13 LAB — BASIC METABOLIC PANEL
BUN/Creatinine Ratio: 11 (ref 10–24)
BUN: 11 mg/dL (ref 8–27)
CO2: 25 mmol/L (ref 18–29)
Calcium: 9.4 mg/dL (ref 8.6–10.2)
Chloride: 99 mmol/L (ref 96–106)
Creatinine, Ser: 1.02 mg/dL (ref 0.76–1.27)
GFR calc Af Amer: 90 mL/min/{1.73_m2} (ref >59)
GFR calc non Af Amer: 78 mL/min/{1.73_m2} (ref >59)
Glucose: 98 mg/dL (ref 65–99)
Potassium: 5.2 mmol/L (ref 3.5–5.2)
Sodium: 140 mmol/L (ref 134–144)

## 2016-11-13 LAB — URIC ACID: Uric Acid: 7.3 mg/dL (ref 3.7–8.6)

## 2016-11-14 LAB — VITAMIN B12: Vitamin B-12: 452 pg/mL (ref 232–1245)

## 2016-11-14 LAB — FOLATE: Folate: >20 ng/mL (ref >3.0)

## 2016-11-14 LAB — SPECIMEN STATUS REPORT

## 2016-11-18 ENCOUNTER — Other Ambulatory Visit: Payer: Self-pay | Admitting: Physician Assistant

## 2016-11-18 DIAGNOSIS — K4020 Bilateral inguinal hernia, without obstruction or gangrene, not specified as recurrent: Secondary | ICD-10-CM

## 2016-11-18 DIAGNOSIS — Z79899 Other long term (current) drug therapy: Secondary | ICD-10-CM

## 2016-11-18 DIAGNOSIS — I10 Essential (primary) hypertension: Secondary | ICD-10-CM

## 2016-11-20 ENCOUNTER — Telehealth: Payer: Self-pay

## 2016-11-20 DIAGNOSIS — M069 Rheumatoid arthritis, unspecified: Secondary | ICD-10-CM | POA: Diagnosis not present

## 2016-11-20 DIAGNOSIS — Z79899 Other long term (current) drug therapy: Secondary | ICD-10-CM | POA: Diagnosis not present

## 2016-11-20 DIAGNOSIS — D8989 Other specified disorders involving the immune mechanism, not elsewhere classified: Secondary | ICD-10-CM | POA: Diagnosis not present

## 2016-11-20 DIAGNOSIS — M79643 Pain in unspecified hand: Secondary | ICD-10-CM | POA: Diagnosis not present

## 2016-11-20 DIAGNOSIS — M79642 Pain in left hand: Secondary | ICD-10-CM | POA: Diagnosis not present

## 2016-11-20 DIAGNOSIS — M19042 Primary osteoarthritis, left hand: Secondary | ICD-10-CM | POA: Diagnosis not present

## 2016-11-20 DIAGNOSIS — M19041 Primary osteoarthritis, right hand: Secondary | ICD-10-CM | POA: Diagnosis not present

## 2016-11-20 DIAGNOSIS — R768 Other specified abnormal immunological findings in serum: Secondary | ICD-10-CM | POA: Diagnosis not present

## 2016-11-20 DIAGNOSIS — M79641 Pain in right hand: Secondary | ICD-10-CM | POA: Diagnosis not present

## 2016-11-20 NOTE — Telephone Encounter (Signed)
Call placed to patient per provider request to make him aware that printed rx is ready for pick up. Patient requesting that rx be faxed to [facility] [phone]) at this time

## 2016-11-27 NOTE — Progress Notes (Signed)
Please have him come back in for a TB screening.  His work up is very reassuring so far. [name], MS, PA-C 11:05 AM, 11/27/2016

## 2016-11-28 ENCOUNTER — Telehealth: Payer: Self-pay

## 2016-11-28 NOTE — Telephone Encounter (Signed)
-----   Message from Tereasa Coop, PA-C sent at 11/27/2016 11:05 AM EDT ----- Please have him come back in for a TB screening.  His work up is very reassuring so far. [name], MS, PA-C 11:05 AM, 11/27/2016

## 2016-11-28 NOTE — Telephone Encounter (Signed)
Call placed to make patient aware, patient states that office no longer accepts his insurance. Patient states that he will return for TB testing as soon and insurance is sorted. Routed to provider for advisement./ John Mcclain,CMA

## 2016-12-10 ENCOUNTER — Encounter: Payer: Self-pay | Admitting: Physician Assistant

## 2016-12-10 NOTE — Telephone Encounter (Signed)
Pt sent MyChart message asking why he needs TB test. Advised part of Medicare Wellness and Maintenance program.  Do you want to put the order in so he doesn't need an OV?? Thanks

## 2016-12-11 NOTE — Addendum Note (Signed)
Addended by: Areta Haber on: 12/11/2016 04:47 PM   Modules accepted: Orders

## 2016-12-11 NOTE — Telephone Encounter (Signed)
Ordered under previous visit as a future order./ S.Shondale Quinley,CMA

## 2016-12-11 NOTE — Telephone Encounter (Signed)
To rule out TB given night sweats.

## 2017-01-04 ENCOUNTER — Ambulatory Visit (INDEPENDENT_AMBULATORY_CARE_PROVIDER_SITE_OTHER): Payer: Medicare PPO | Admitting: Physician Assistant

## 2017-01-04 ENCOUNTER — Encounter: Payer: Self-pay | Admitting: Physician Assistant

## 2017-01-04 VITALS — BP 127/75 | HR 98 | Temp 98.0°F | Resp 16 | Ht 65.0 in | Wt 171.0 lb

## 2017-01-04 DIAGNOSIS — Z111 Encounter for screening for respiratory tuberculosis: Secondary | ICD-10-CM

## 2017-01-04 DIAGNOSIS — G542 Cervical root disorders, not elsewhere classified: Secondary | ICD-10-CM | POA: Diagnosis not present

## 2017-01-04 DIAGNOSIS — L03012 Cellulitis of left finger: Secondary | ICD-10-CM | POA: Diagnosis not present

## 2017-01-04 DIAGNOSIS — R7309 Other abnormal glucose: Secondary | ICD-10-CM | POA: Diagnosis not present

## 2017-01-04 MED ORDER — PREDNISONE 20 MG PO TABS: 40.0000 mg | ORAL_TABLET | Freq: Every day | ORAL | 0 refills | Status: DC

## 2017-01-04 MED ORDER — CEPHALEXIN 500 MG PO CAPS: 500.0000 mg | ORAL_CAPSULE | Freq: Three times a day (TID) | ORAL | 0 refills | Status: DC

## 2017-01-04 NOTE — Progress Notes (Signed)
01/04/2017 4:13 PM   DOB: 02-Mar-1956 / MRN: [id number]  SUBJECTIVE:  John Mcclain is a 61 y.o. male presenting for neck pain.  Tells me this has been waxing and waning for about 2 weeks now.  Has tried flexeril with mild relief.  Describes the pain as severe and buring.  No radicular pattern to the fingers.   Complains of chronic swelling and pain about the left middle finger just distal to the DIP. Tells me that this has been present for "years now," however does not usually hurt quite this badly.  Tells me "I poked it a few days ago and white stuff came out."  He is allergic to penicillins.   He  has a past medical history of Anxiety; Arthritis; Chronic migraine; Depression; Diabetes mellitus without complication ([place]); Heart murmur; Hypertension; Kidney stones; and Raynaud's disease.    He  reports that he has never smoked. He has never used smokeless tobacco. He reports that he drinks alcohol. He reports that he does not use drugs. He  reports that he currently engages in sexual activity and has had male partners. The patient  has a past surgical history that includes Colon surgery; Vasectomy; and Lymph node dissection.  His family history includes Alzheimer's disease in his paternal grandmother; Arthritis in his mother; COPD in his father and mother; Diabetes in his father; Heart attack in his father; Hyperlipidemia in his mother; Hypertension in his father and mother; Kidney disease in his mother; Migraines in his brother; Stroke in his maternal grandfather.  Review of Systems  Constitutional: Negative for chills and fever.  Skin: Negative for itching and rash.    The problem list and medications were reviewed and updated by myself where necessary and exist elsewhere in the encounter.   OBJECTIVE:  BP 127/75 (BP Location: Right Arm, Patient Position: Sitting, Cuff Size: Normal)   Pulse 98   Temp 98 F (36.7 C) (Oral)   Resp 16   Ht 5\' 5"  (1.651 m)   Wt 171 lb (77.6 kg)    SpO2 99%   BMI 28.46 kg/m   Physical Exam  Constitutional: He appears well-developed. He is active and cooperative.  Non-toxic appearance.  Cardiovascular: Normal rate, regular rhythm, S1 normal, S2 normal, normal heart sounds, intact distal pulses and normal pulses.  Exam reveals no gallop and no friction rub.   No murmur heard. Pulmonary/Chest: Effort normal. No stridor. No tachypnea. No respiratory distress. He has no wheezes. He has no rales.  Abdominal: He exhibits no distension.  Musculoskeletal: He exhibits tenderness (right trapezius). He exhibits no edema.       Hands: Neurological: He is alert.  Skin: Skin is warm and dry. He is not diaphoretic. No pallor.  Vitals reviewed.   Wt Readings from Last 3 Encounters:  01/04/17 171 lb (77.6 kg)  11/06/16 170 lb 12.8 oz (77.5 kg)  05/11/16 168 lb 12.8 oz (76.6 kg)   Temp Readings from Last 3 Encounters:  01/04/17 98 F (36.7 C) (Oral)  11/06/16 98 F (36.7 C) (Oral)  05/11/16 97.8 F (36.6 C) (Oral)   BP Readings from Last 3 Encounters:  01/04/17 127/75  11/06/16 138/88  05/11/16 130/82   Pulse Readings from Last 3 Encounters:  01/04/17 98  11/06/16 93  05/11/16 99    Lab Results  Component Value Date   HGBA1C 6.0 (H) 05/11/2016   Lab Results  Component Value Date   CREATININE 1.02 11/06/2016   BUN 11 11/06/2016  NA 140 11/06/2016   K 5.2 11/06/2016   CL 99 11/06/2016   CO2 25 11/06/2016   Lab Results  Component Value Date   ALT 38 11/06/2016   AST 33 11/06/2016   ALKPHOS 50 11/06/2016   BILITOT 0.5 11/06/2016   No results found for this or any previous visit (from the past 72 hour(s)).  No results found.  Procedure: Point of maximal tenderness along the right trapezius identified.  Alcohol prep. Trapezius muscle belly injected with 4 cc of bupivicane at the point of maximal tenderness.  No blood loss.  Patient tolerated the procedure well and without blood lose.  Immediate mild relief in pain.    ASSESSMENT AND PLAN:  John Mcclain was seen today for shoulder pain.  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  He needs to see derm to rule out malignancy.  -     cephALEXin (KEFLEX) 500 MG capsule; Take 1 capsule (500 mg total) by mouth 3 (three) times daily. -     Ambulatory referral to Dermatology  PPD screening test: For follow up of night sweats.  -     TB Skin Test  Cervical neuropathy:Muscle belly peppered with bupivicane.  Started pred. He has already seen ortho regarding his neck and has been advised that he needs surgery.  He wants to put this off as long as possible.  -     predniSONE (DELTASONE) 20 MG tablet; Take 2 tablets (40 mg total) by mouth daily with breakfast.  Elevated hemoglobin A1c: Improved.  -     Hemoglobin A1c    The patient is advised to call or return to clinic if he does not see an improvement in symptoms, or to seek the care of the closest emergency department if he worsens with the above plan.   [name], MHS, PA-C [facility] 01/04/2017 4:13 PM

## 2017-01-04 NOTE — Patient Instructions (Signed)
     IF you received an x-ray today, you will receive an invoice from [facility]. Please contact [facility] at [phone] with questions or concerns regarding your invoice.   IF you received labwork today, you will receive an invoice from [facility]. Please contact [facility] at [phone] with questions or concerns regarding your invoice.   Our billing staff will not be able to assist you with questions regarding bills from these companies.  You will be contacted with the lab results as soon as they are available. The fastest way to get your results is to activate your My Chart account. Instructions are located on the last page of this paperwork. If you have not heard from us regarding the results in 2 weeks, please contact this office.     

## 2017-01-05 LAB — HEMOGLOBIN A1C
Est. average glucose Bld gHb Est-mCnc: 117 mg/dL
Hgb A1c MFr Bld: 5.7 % — ABNORMAL HIGH (ref 4.8–5.6)

## 2017-01-06 ENCOUNTER — Ambulatory Visit (INDEPENDENT_AMBULATORY_CARE_PROVIDER_SITE_OTHER): Payer: Medicare PPO | Admitting: Family Medicine

## 2017-01-06 DIAGNOSIS — Z111 Encounter for screening for respiratory tuberculosis: Secondary | ICD-10-CM

## 2017-01-06 LAB — TB SKIN TEST: TB Skin Test: NEGATIVE

## 2017-01-06 NOTE — Progress Notes (Signed)
Pt here to have TB skin test read. Negative results.

## 2017-01-06 NOTE — Patient Instructions (Signed)
     IF you received an x-ray today, you will receive an invoice from [facility]. Please contact [facility] at [phone] with questions or concerns regarding your invoice.   IF you received labwork today, you will receive an invoice from [facility]. Please contact [facility] at [phone] with questions or concerns regarding your invoice.   Our billing staff will not be able to assist you with questions regarding bills from these companies.  You will be contacted with the lab results as soon as they are available. The fastest way to get your results is to activate your My Chart account. Instructions are located on the last page of this paperwork. If you have not heard from us regarding the results in 2 weeks, please contact this office.     

## 2017-01-12 ENCOUNTER — Other Ambulatory Visit: Payer: Self-pay | Admitting: Physician Assistant

## 2017-01-12 DIAGNOSIS — Z760 Encounter for issue of repeat prescription: Secondary | ICD-10-CM

## 2017-01-20 ENCOUNTER — Other Ambulatory Visit: Payer: Self-pay | Admitting: Physician Assistant

## 2017-01-20 DIAGNOSIS — Z79899 Other long term (current) drug therapy: Secondary | ICD-10-CM

## 2017-01-20 DIAGNOSIS — K4020 Bilateral inguinal hernia, without obstruction or gangrene, not specified as recurrent: Secondary | ICD-10-CM

## 2017-01-20 DIAGNOSIS — I10 Essential (primary) hypertension: Secondary | ICD-10-CM

## 2017-02-28 DIAGNOSIS — Z79899 Other long term (current) drug therapy: Secondary | ICD-10-CM | POA: Diagnosis not present

## 2017-02-28 DIAGNOSIS — M79643 Pain in unspecified hand: Secondary | ICD-10-CM | POA: Diagnosis not present

## 2017-02-28 DIAGNOSIS — M069 Rheumatoid arthritis, unspecified: Secondary | ICD-10-CM | POA: Diagnosis not present

## 2017-02-28 DIAGNOSIS — M25539 Pain in unspecified wrist: Secondary | ICD-10-CM | POA: Diagnosis not present

## 2017-02-28 DIAGNOSIS — R768 Other specified abnormal immunological findings in serum: Secondary | ICD-10-CM | POA: Diagnosis not present

## 2017-03-20 DIAGNOSIS — G43019 Migraine without aura, intractable, without status migrainosus: Secondary | ICD-10-CM | POA: Diagnosis not present

## 2017-03-20 DIAGNOSIS — G43719 Chronic migraine without aura, intractable, without status migrainosus: Secondary | ICD-10-CM | POA: Diagnosis not present

## 2017-03-28 ENCOUNTER — Other Ambulatory Visit: Payer: Self-pay | Admitting: Physician Assistant

## 2017-03-28 DIAGNOSIS — I10 Essential (primary) hypertension: Secondary | ICD-10-CM

## 2017-03-29 DIAGNOSIS — G43719 Chronic migraine without aura, intractable, without status migrainosus: Secondary | ICD-10-CM | POA: Diagnosis not present

## 2017-03-29 DIAGNOSIS — G43019 Migraine without aura, intractable, without status migrainosus: Secondary | ICD-10-CM | POA: Diagnosis not present

## 2017-04-06 ENCOUNTER — Telehealth: Payer: Self-pay | Admitting: Physician Assistant

## 2017-04-06 NOTE — Telephone Encounter (Signed)
Pt is needing a refill on rosuvastatin  Best number is [phone]

## 2017-04-24 ENCOUNTER — Other Ambulatory Visit: Payer: Self-pay | Admitting: Physician Assistant

## 2017-04-24 DIAGNOSIS — I10 Essential (primary) hypertension: Secondary | ICD-10-CM

## 2017-04-27 ENCOUNTER — Telehealth: Payer: Self-pay | Admitting: Physician Assistant

## 2017-04-27 NOTE — Telephone Encounter (Signed)
Phone call to patient. Left message stating medication refill was declined due to need for office visit, please call back clinic to schedule. Closing note.

## 2017-04-27 NOTE — Telephone Encounter (Signed)
Copied from [place]. Topic: Quick Communication - See Telephone Encounter >> Apr 27, 2017  9:34 AM Cleaster Corin, NT wrote: CRM for notification. See Telephone encounter for:   04/27/17. Pt. Called and said that he needs 90 days rx. For his lisinopril, amlodipine, and he will also need a refill of rosuvastatin all need to be 90 days worth any questions please call pt. At [phone]

## 2017-05-07 ENCOUNTER — Ambulatory Visit: Payer: Medicare PPO | Admitting: Physician Assistant

## 2017-05-07 ENCOUNTER — Other Ambulatory Visit: Payer: Self-pay

## 2017-05-07 ENCOUNTER — Encounter: Payer: Self-pay | Admitting: Physician Assistant

## 2017-05-07 VITALS — BP 118/76 | HR 90 | Temp 98.1°F | Resp 16 | Ht 65.0 in | Wt 167.4 lb

## 2017-05-07 DIAGNOSIS — F418 Other specified anxiety disorders: Secondary | ICD-10-CM

## 2017-05-07 DIAGNOSIS — Z299 Encounter for prophylactic measures, unspecified: Secondary | ICD-10-CM

## 2017-05-07 DIAGNOSIS — Z23 Encounter for immunization: Secondary | ICD-10-CM | POA: Diagnosis not present

## 2017-05-07 DIAGNOSIS — I10 Essential (primary) hypertension: Secondary | ICD-10-CM | POA: Diagnosis not present

## 2017-05-07 DIAGNOSIS — E785 Hyperlipidemia, unspecified: Secondary | ICD-10-CM | POA: Diagnosis not present

## 2017-05-07 DIAGNOSIS — N529 Male erectile dysfunction, unspecified: Secondary | ICD-10-CM

## 2017-05-07 DIAGNOSIS — K5904 Chronic idiopathic constipation: Secondary | ICD-10-CM | POA: Diagnosis not present

## 2017-05-07 MED ORDER — LORAZEPAM 1 MG PO TABS: 0.5000 mg | ORAL_TABLET | Freq: Every day | ORAL | 0 refills | Status: DC | PRN

## 2017-05-07 MED ORDER — POLYETHYLENE GLYCOL 3350 17 GM/SCOOP PO POWD: 17.0000 g | Freq: Every day | ORAL | 1 refills | Status: DC

## 2017-05-07 MED ORDER — AMLODIPINE BESYLATE 2.5 MG PO TABS: 2.5000 mg | ORAL_TABLET | Freq: Every day | ORAL | 3 refills | Status: DC

## 2017-05-07 MED ORDER — ROSUVASTATIN CALCIUM 5 MG PO TABS: 5.0000 mg | ORAL_TABLET | Freq: Every day | ORAL | 3 refills | Status: DC

## 2017-05-07 MED ORDER — LISINOPRIL 10 MG PO TABS: 10.0000 mg | ORAL_TABLET | Freq: Every day | ORAL | 3 refills | Status: DC

## 2017-05-07 MED ORDER — SILDENAFIL CITRATE 20 MG PO TABS: 60.0000 mg | ORAL_TABLET | Freq: Three times a day (TID) | ORAL | 6 refills | Status: DC

## 2017-05-07 NOTE — Patient Instructions (Signed)
     IF you received an x-ray today, you will receive an invoice from [facility]. Please contact  Radiology at [phone] with questions or concerns regarding your invoice.   IF you received labwork today, you will receive an invoice from [facility]. Please contact [facility] at [phone] with questions or concerns regarding your invoice.   Our billing staff will not be able to assist you with questions regarding bills from these companies.  You will be contacted with the lab results as soon as they are available. The fastest way to get your results is to activate your My Chart account. Instructions are located on the last page of this paperwork. If you have not heard from us regarding the results in 2 weeks, please contact this office.     

## 2017-05-07 NOTE — Progress Notes (Signed)
05/07/2017 3:10 PM   DOB: 05-06-1956 / MRN: [id number]  SUBJECTIVE:  John Mcclain is a 61 y.o. male presenting for med refills.  He feels well today. Is being started new injectable migraine medication. He is hoping he will be able to stop the impramine eventually.   HTN has been well controlled. Taking 5 of Crestor daily of risk management purposes. Takes Miralax daily for control of chronic constipation.   Taking lorazepam 1/2 lorazepam daily to every other day.  This helps with tremor, HA, and anxiousness associoated with HA.   Takes sildenafil 60-80 mg for unspecified ED and this works well enough for him to have an erection but can only have achieve ejaculation about 1/2 the time.  He would like to try a higher dose if possible.    He is allergic to penicillins.   He  has a past medical history of Anxiety, Arthritis, Chronic migraine, Depression, Diabetes mellitus without complication ([place]), Heart murmur, Hypertension, Kidney stones, and Raynaud's disease.    He  reports that  has never smoked. he has never used smokeless tobacco. He reports that he drinks alcohol. He reports that he does not use drugs. He  reports that he currently engages in sexual activity and has had partners who are Male. The patient  has a past surgical history that includes Colon surgery; Vasectomy; and Lymph node dissection.  His family history includes Alzheimer's disease in his paternal grandmother; Arthritis in his mother; COPD in his father and mother; Diabetes in his father; Heart attack in his father; Hyperlipidemia in his mother; Hypertension in his father and mother; Kidney disease in his mother; Migraines in his brother; Stroke in his maternal grandfather.  Review of Systems  Constitutional: Negative for chills, diaphoresis and fever.  Eyes: Negative.   Respiratory: Negative for cough, hemoptysis, sputum production, shortness of breath and wheezing.   Cardiovascular: Negative for chest pain,  orthopnea and leg swelling.  Gastrointestinal: Negative for nausea.  Skin: Negative for rash.  Neurological: Negative for dizziness, sensory change, speech change, focal weakness and headaches.    The problem list and medications were reviewed and updated by myself where necessary and exist elsewhere in the encounter.   OBJECTIVE:  BP 118/76 (BP Location: Right Arm, Patient Position: Sitting, Cuff Size: Normal)   Pulse 90   Temp 98.1 F (36.7 C) (Oral)   Resp 16   Ht 5\' 5"  (1.651 m)   Wt 167 lb 6.4 oz (75.9 kg)   SpO2 98%   BMI 27.86 kg/m   Physical Exam  Constitutional: He appears well-developed. He is active and cooperative.  Non-toxic appearance.  Cardiovascular: Normal rate, regular rhythm, S1 normal, S2 normal, normal heart sounds, intact distal pulses and normal pulses. Exam reveals no gallop and no friction rub.  No murmur heard. Pulmonary/Chest: Effort normal. No stridor. No tachypnea. No respiratory distress. He has no wheezes. He has no rales.  Abdominal: He exhibits no distension.  Musculoskeletal: He exhibits no edema.  Neurological: He is alert.  Skin: Skin is warm and dry. He is not diaphoretic. No pallor.  Vitals reviewed.   Lab Results  Component Value Date   WBC 5.9 11/06/2016   HGB 16.0 11/06/2016   HCT 47.7 11/06/2016   MCV 102 (H) 11/06/2016   PLT 244 11/06/2016    Lab Results  Component Value Date   CREATININE 1.02 11/06/2016   BUN 11 11/06/2016   NA 140 11/06/2016   K 5.2 11/06/2016   CL  99 11/06/2016   CO2 25 11/06/2016    Lab Results  Component Value Date   ALT 38 11/06/2016   AST 33 11/06/2016   ALKPHOS 50 11/06/2016   BILITOT 0.5 11/06/2016    Lab Results  Component Value Date   TSH 0.868 05/11/2016    Lab Results  Component Value Date   HGBA1C 5.7 (H) 01/04/2017    Lab Results  Component Value Date   CHOL 177 05/11/2016   HDL 59 05/11/2016   LDLCALC 84 05/11/2016   TRIG 168 (H) 05/11/2016   CHOLHDL 3.0 05/11/2016       No results found for this or any previous visit (from the past 72 hour(s)).  No results found.  ASSESSMENT AND PLAN:  Lautaro was seen today for medication refill. He is doing exquisitely well today.  Labs and meds stable. Will refill and see him back in about 1 year, sooner if he feels poorly.   Diagnoses and all orders for this visit:  Dyslipidemia -     Renal Function Panel -     Hepatic function panel  Essential hypertension -     Lipid Panel -     lisinopril (PRINIVIL,ZESTRIL) 10 MG tablet; Take 1 tablet (10 mg total) by mouth daily. -     amLODipine (NORVASC) 2.5 MG tablet; Take 1 tablet (2.5 mg total) by mouth daily.  Needs flu shot -     Flu Vaccine QUAD 36+ mos IM  Need for prophylactic vaccination against Streptococcus pneumoniae (pneumococcus) -     Pneumococcal conjugate vaccine 13-valent IM  Encounter for prophylactic measures, unspecified -     rosuvastatin (CRESTOR) 5 MG tablet; Take 1 tablet (5 mg total) by mouth daily.  Erectile dysfunction, unspecified erectile dysfunction type -     sildenafil (REVATIO) 20 MG tablet; Take 3-5 tablets (60-100 mg total) by mouth 3 (three) times daily.  Chronic idiopathic constipation -     polyethylene glycol powder (GLYCOLAX/MIRALAX) powder; Take 17 g by mouth daily.  Anxiety about health -     LORazepam (ATIVAN) 1 MG tablet; Take 0.5-1 tablets (0.5-1 mg total) by mouth daily as needed for anxiety. Use as sparingly as possible.  Other orders -     Cancel: Pneumococcal polysaccharide vaccine 23-valent greater than or equal to 2yo subcutaneous/IM    The patient is advised to call or return to clinic if he does not see an improvement in symptoms, or to seek the care of the closest emergency department if he worsens with the above plan.   [name], MHS, PA-C [facility] 05/07/2017 3:10 PM

## 2017-05-08 LAB — RENAL FUNCTION PANEL
Albumin: 4.9 g/dL — ABNORMAL HIGH (ref 3.6–4.8)
BUN/Creatinine Ratio: 8 — ABNORMAL LOW (ref 10–24)
BUN: 10 mg/dL (ref 8–27)
CO2: 27 mmol/L (ref 20–29)
Calcium: 10 mg/dL (ref 8.6–10.2)
Chloride: 100 mmol/L (ref 96–106)
Creatinine, Ser: 1.19 mg/dL (ref 0.76–1.27)
GFR calc Af Amer: 75 mL/min/{1.73_m2} (ref >59)
GFR calc non Af Amer: 65 mL/min/{1.73_m2} (ref >59)
Glucose: 79 mg/dL (ref 65–99)
Phosphorus: 2.8 mg/dL (ref 2.5–4.5)
Potassium: 4.4 mmol/L (ref 3.5–5.2)
Sodium: 140 mmol/L (ref 134–144)

## 2017-05-08 LAB — HEPATIC FUNCTION PANEL
ALT: 32 IU/L (ref 0–44)
AST: 27 IU/L (ref 0–40)
Alkaline Phosphatase: 50 IU/L (ref 39–117)
Bilirubin Total: 0.4 mg/dL (ref 0.0–1.2)
Bilirubin, Direct: 0.19 mg/dL (ref 0.00–0.40)
Total Protein: 7.3 g/dL (ref 6.0–8.5)

## 2017-05-08 LAB — LIPID PANEL
Chol/HDL Ratio: 2 ratio (ref 0.0–5.0)
Cholesterol, Total: 136 mg/dL (ref 100–199)
HDL: 68 mg/dL (ref >39)
LDL Calculated: 49 mg/dL (ref 0–99)
Triglycerides: 95 mg/dL (ref 0–149)
VLDL Cholesterol Cal: 19 mg/dL (ref 5–40)

## 2017-05-10 DIAGNOSIS — G43019 Migraine without aura, intractable, without status migrainosus: Secondary | ICD-10-CM | POA: Diagnosis not present

## 2017-05-10 DIAGNOSIS — G43719 Chronic migraine without aura, intractable, without status migrainosus: Secondary | ICD-10-CM | POA: Diagnosis not present

## 2017-05-24 ENCOUNTER — Other Ambulatory Visit: Payer: Self-pay | Admitting: Physician Assistant

## 2017-05-24 DIAGNOSIS — Z79899 Other long term (current) drug therapy: Secondary | ICD-10-CM

## 2017-06-04 ENCOUNTER — Other Ambulatory Visit: Payer: Self-pay | Admitting: Physician Assistant

## 2017-06-12 ENCOUNTER — Telehealth: Payer: Self-pay

## 2017-06-12 NOTE — Telephone Encounter (Signed)
Copied from [place] [id number]. Topic: General - Other >> Jun 12, 2017 11:33 AM Ahmed Prima L wrote: Pt is requesting to speak with Idelia Salm about something (does not want to speak with the actual billing)  [phone]

## 2017-07-19 ENCOUNTER — Other Ambulatory Visit: Payer: Self-pay

## 2017-07-19 ENCOUNTER — Other Ambulatory Visit: Payer: Self-pay | Admitting: Physician Assistant

## 2017-07-19 DIAGNOSIS — Z299 Encounter for prophylactic measures, unspecified: Secondary | ICD-10-CM

## 2017-07-19 DIAGNOSIS — I10 Essential (primary) hypertension: Secondary | ICD-10-CM

## 2017-07-19 MED ORDER — ROSUVASTATIN CALCIUM 5 MG PO TABS: 5.0000 mg | ORAL_TABLET | Freq: Every day | ORAL | 3 refills | Status: DC

## 2017-07-19 MED ORDER — AMLODIPINE BESYLATE 2.5 MG PO TABS: 2.5000 mg | ORAL_TABLET | Freq: Every day | ORAL | 3 refills | Status: DC

## 2017-07-19 NOTE — Telephone Encounter (Signed)
Amlodipine  refill Last OV: 05/07/17 Last Refill:05/07/17 Pharmacy:switched to [facility] refill Last OV: 05/07/17 Last Refill:05/07/17 Pharmacy:switched to [facility]

## 2017-08-06 DIAGNOSIS — G43019 Migraine without aura, intractable, without status migrainosus: Secondary | ICD-10-CM | POA: Diagnosis not present

## 2017-08-06 DIAGNOSIS — G43719 Chronic migraine without aura, intractable, without status migrainosus: Secondary | ICD-10-CM | POA: Diagnosis not present

## 2017-10-10 DIAGNOSIS — M25569 Pain in unspecified knee: Secondary | ICD-10-CM | POA: Diagnosis not present

## 2017-10-10 DIAGNOSIS — M1711 Unilateral primary osteoarthritis, right knee: Secondary | ICD-10-CM | POA: Diagnosis not present

## 2017-10-10 DIAGNOSIS — M79643 Pain in unspecified hand: Secondary | ICD-10-CM | POA: Diagnosis not present

## 2017-10-10 DIAGNOSIS — M1712 Unilateral primary osteoarthritis, left knee: Secondary | ICD-10-CM | POA: Diagnosis not present

## 2017-10-10 DIAGNOSIS — M25539 Pain in unspecified wrist: Secondary | ICD-10-CM | POA: Diagnosis not present

## 2017-10-10 DIAGNOSIS — Z79899 Other long term (current) drug therapy: Secondary | ICD-10-CM | POA: Diagnosis not present

## 2017-10-10 DIAGNOSIS — R768 Other specified abnormal immunological findings in serum: Secondary | ICD-10-CM | POA: Diagnosis not present

## 2017-10-10 DIAGNOSIS — M069 Rheumatoid arthritis, unspecified: Secondary | ICD-10-CM | POA: Diagnosis not present

## 2017-10-10 DIAGNOSIS — M25561 Pain in right knee: Secondary | ICD-10-CM | POA: Diagnosis not present

## 2017-12-04 ENCOUNTER — Other Ambulatory Visit: Payer: Self-pay | Admitting: Physician Assistant

## 2017-12-04 DIAGNOSIS — G43719 Chronic migraine without aura, intractable, without status migrainosus: Secondary | ICD-10-CM | POA: Diagnosis not present

## 2017-12-04 DIAGNOSIS — F418 Other specified anxiety disorders: Secondary | ICD-10-CM

## 2017-12-04 DIAGNOSIS — G43019 Migraine without aura, intractable, without status migrainosus: Secondary | ICD-10-CM | POA: Diagnosis not present

## 2017-12-06 ENCOUNTER — Other Ambulatory Visit: Payer: Self-pay | Admitting: Physician Assistant

## 2017-12-06 DIAGNOSIS — F418 Other specified anxiety disorders: Secondary | ICD-10-CM

## 2017-12-09 NOTE — Telephone Encounter (Signed)
Refill req for ativan sent to [facility]

## 2017-12-11 ENCOUNTER — Other Ambulatory Visit: Payer: Self-pay | Admitting: Physician Assistant

## 2017-12-11 DIAGNOSIS — F418 Other specified anxiety disorders: Secondary | ICD-10-CM

## 2017-12-11 MED ORDER — LORAZEPAM 1 MG PO TABS: 0.5000 mg | ORAL_TABLET | Freq: Every day | ORAL | 0 refills | Status: DC | PRN

## 2017-12-11 MED ORDER — LORAZEPAM 1 MG PO TABS
0.5000 mg | ORAL_TABLET | Freq: Every day | ORAL | 0 refills | Status: DC | PRN
Start: 2017-12-11 — End: 2017-12-11

## 2018-01-10 DIAGNOSIS — M1990 Unspecified osteoarthritis, unspecified site: Secondary | ICD-10-CM | POA: Diagnosis not present

## 2018-01-10 DIAGNOSIS — R768 Other specified abnormal immunological findings in serum: Secondary | ICD-10-CM | POA: Diagnosis not present

## 2018-01-10 DIAGNOSIS — Z79899 Other long term (current) drug therapy: Secondary | ICD-10-CM | POA: Diagnosis not present

## 2018-01-10 DIAGNOSIS — M47816 Spondylosis without myelopathy or radiculopathy, lumbar region: Secondary | ICD-10-CM | POA: Diagnosis not present

## 2018-01-10 DIAGNOSIS — M549 Dorsalgia, unspecified: Secondary | ICD-10-CM | POA: Diagnosis not present

## 2018-01-10 DIAGNOSIS — M160 Bilateral primary osteoarthritis of hip: Secondary | ICD-10-CM | POA: Diagnosis not present

## 2018-01-10 DIAGNOSIS — M069 Rheumatoid arthritis, unspecified: Secondary | ICD-10-CM | POA: Diagnosis not present

## 2018-01-10 DIAGNOSIS — M810 Age-related osteoporosis without current pathological fracture: Secondary | ICD-10-CM | POA: Diagnosis not present

## 2018-01-10 DIAGNOSIS — M25559 Pain in unspecified hip: Secondary | ICD-10-CM | POA: Diagnosis not present

## 2018-01-11 ENCOUNTER — Encounter: Payer: Self-pay | Admitting: Physician Assistant

## 2018-01-11 DIAGNOSIS — M810 Age-related osteoporosis without current pathological fracture: Secondary | ICD-10-CM | POA: Diagnosis not present

## 2018-01-30 ENCOUNTER — Other Ambulatory Visit: Payer: Self-pay | Admitting: Physician Assistant

## 2018-01-30 DIAGNOSIS — F418 Other specified anxiety disorders: Secondary | ICD-10-CM

## 2018-02-01 NOTE — Telephone Encounter (Signed)
Patient is requesting a refill of the following medications: Requested Prescriptions   Pending Prescriptions Disp Refills  . LORazepam (ATIVAN) 1 MG tablet 90 tablet 0    Sig: Take 0.5-1 tablets (0.5-1 mg total) by mouth daily as needed for anxiety. Use as sparingly as possible.    Date of patient request: 01/30/2018 Last office visit: 05/07/2018 Date of last refill: 12/11/2017 Last refill amount: 90 Follow up time period per chart:

## 2018-02-02 ENCOUNTER — Other Ambulatory Visit: Payer: Self-pay | Admitting: Physician Assistant

## 2018-02-02 DIAGNOSIS — F418 Other specified anxiety disorders: Secondary | ICD-10-CM

## 2018-02-02 NOTE — Telephone Encounter (Signed)
Lorazepam denied. Last OV was in dec 2018. He needs to be seen. Thanks

## 2018-02-07 NOTE — Telephone Encounter (Signed)
Patient is requesting a refill of the following medications: Requested Prescriptions   Pending Prescriptions Disp Refills  . LORazepam (ATIVAN) 1 MG tablet 90 tablet 0    Sig: Take 0.5-1 tablets (0.5-1 mg total) by mouth daily as needed for anxiety. Use as sparingly as possible.    Date of patient request: 02/02/2018 Last office visit: 05/07/2017 Date of last refill: 12/11/2017 Last refill amount: 90 Follow up time period per chart:

## 2018-02-11 DIAGNOSIS — M810 Age-related osteoporosis without current pathological fracture: Secondary | ICD-10-CM | POA: Diagnosis not present

## 2018-02-11 DIAGNOSIS — M069 Rheumatoid arthritis, unspecified: Secondary | ICD-10-CM | POA: Diagnosis not present

## 2018-02-11 DIAGNOSIS — R768 Other specified abnormal immunological findings in serum: Secondary | ICD-10-CM | POA: Diagnosis not present

## 2018-02-11 DIAGNOSIS — E559 Vitamin D deficiency, unspecified: Secondary | ICD-10-CM | POA: Diagnosis not present

## 2018-02-11 DIAGNOSIS — Z79899 Other long term (current) drug therapy: Secondary | ICD-10-CM | POA: Diagnosis not present

## 2018-02-11 DIAGNOSIS — M549 Dorsalgia, unspecified: Secondary | ICD-10-CM | POA: Diagnosis not present

## 2018-02-11 NOTE — Telephone Encounter (Signed)
Please let patient know that refill request for lorazepam has been declined His last appt with [name] was in Dec 2018 He needs to establish with new PCP, please schedule thanks    pmp reviewed Does not take daily

## 2018-04-15 DIAGNOSIS — M549 Dorsalgia, unspecified: Secondary | ICD-10-CM | POA: Diagnosis not present

## 2018-04-15 DIAGNOSIS — M810 Age-related osteoporosis without current pathological fracture: Secondary | ICD-10-CM | POA: Diagnosis not present

## 2018-04-15 DIAGNOSIS — M069 Rheumatoid arthritis, unspecified: Secondary | ICD-10-CM | POA: Diagnosis not present

## 2018-04-15 DIAGNOSIS — M1990 Unspecified osteoarthritis, unspecified site: Secondary | ICD-10-CM | POA: Diagnosis not present

## 2018-04-15 DIAGNOSIS — R768 Other specified abnormal immunological findings in serum: Secondary | ICD-10-CM | POA: Diagnosis not present

## 2018-04-15 DIAGNOSIS — M25559 Pain in unspecified hip: Secondary | ICD-10-CM | POA: Diagnosis not present

## 2018-04-15 DIAGNOSIS — M25531 Pain in right wrist: Secondary | ICD-10-CM | POA: Diagnosis not present

## 2018-04-15 DIAGNOSIS — Z79899 Other long term (current) drug therapy: Secondary | ICD-10-CM | POA: Diagnosis not present

## 2018-05-06 ENCOUNTER — Other Ambulatory Visit: Payer: Self-pay | Admitting: Physician Assistant

## 2018-05-06 DIAGNOSIS — F418 Other specified anxiety disorders: Secondary | ICD-10-CM

## 2018-05-17 ENCOUNTER — Other Ambulatory Visit: Payer: Self-pay | Admitting: Physician Assistant

## 2018-05-17 DIAGNOSIS — I10 Essential (primary) hypertension: Secondary | ICD-10-CM

## 2018-05-23 ENCOUNTER — Encounter: Payer: Self-pay | Admitting: Emergency Medicine

## 2018-05-23 ENCOUNTER — Ambulatory Visit: Payer: Medicare PPO | Admitting: Emergency Medicine

## 2018-05-23 ENCOUNTER — Other Ambulatory Visit: Payer: Self-pay

## 2018-05-23 VITALS — BP 127/74 | HR 102 | Temp 98.8°F | Resp 16 | Ht 64.75 in | Wt 163.6 lb

## 2018-05-23 DIAGNOSIS — I10 Essential (primary) hypertension: Secondary | ICD-10-CM | POA: Diagnosis not present

## 2018-05-23 DIAGNOSIS — M159 Polyosteoarthritis, unspecified: Secondary | ICD-10-CM | POA: Diagnosis not present

## 2018-05-23 DIAGNOSIS — M069 Rheumatoid arthritis, unspecified: Secondary | ICD-10-CM | POA: Diagnosis not present

## 2018-05-23 DIAGNOSIS — F419 Anxiety disorder, unspecified: Secondary | ICD-10-CM | POA: Diagnosis not present

## 2018-05-23 DIAGNOSIS — G43809 Other migraine, not intractable, without status migrainosus: Secondary | ICD-10-CM | POA: Diagnosis not present

## 2018-05-23 DIAGNOSIS — E785 Hyperlipidemia, unspecified: Secondary | ICD-10-CM | POA: Diagnosis not present

## 2018-05-23 DIAGNOSIS — Z23 Encounter for immunization: Secondary | ICD-10-CM

## 2018-05-23 MED ORDER — LORAZEPAM 1 MG PO TABS: 0.5000 mg | ORAL_TABLET | Freq: Every day | ORAL | 0 refills | Status: DC | PRN

## 2018-05-23 NOTE — Patient Instructions (Addendum)
If you have lab work done today you will be contacted with your lab results within the next 2 weeks.  If you have not heard from [place] then please contact us. The fastest way to get your results is to register for My Chart.   IF you received an x-ray today, you will receive an invoice from [facility]. Please contact [facility] at [phone] with questions or concerns regarding your invoice.   IF you received labwork today, you will receive an invoice from [facility]. Please contact [facility] at [phone] with questions or concerns regarding your invoice.   Our billing staff will not be able to assist you with questions regarding bills from these companies.  You will be contacted with the lab results as soon as they are available. The fastest way to get your results is to activate your My Chart account. Instructions are located on the last page of this paperwork. If you have not heard from [place] regarding the results in 2 weeks, please contact this office.     Rheumatoid Arthritis Rheumatoid arthritis (RA) is a long-term (chronic) disease. RA causes inflammation in your joints. Your joints may feel painful, stiff, swollen, and warm. RA may start slowly. It most often affects the small joints of the hands and feet. It can also affect other parts of the body. Symptoms of RA often come and go. There is no cure for RA, but medicines can help your symptoms. What are the causes?  RA is an autoimmune disease. This means that your body's defense system (immune system) attacks healthy parts of your body by mistake. The exact cause of RA is not known. What increases the risk?  Being a woman.  Having a family history of RA or other diseases like RA.  Smoking.  Being overweight.  Being exposed to pollutants or chemicals. What are the signs or symptoms?  Morning stiffness that lasts longer than 30 minutes. This is often the first symptom.  Symptoms start slowly. They are  often worse in the morning.  As RA gets worse, symptoms may include: ? Pain, stiffness, swelling, warmth, and tenderness in joints on both sides of your body. ? Loss of energy. ? Not feeling hungry. ? Weight loss. ? A low fever. ? Dry eyes and a dry mouth. ? Firm lumps that grow under your skin. ? Changes in the way your joints look. ? Changes in the way your joints work.  Symptoms vary and they: ? Often come and go. ? Sometimes get worse for a period of time. These are called flares. How is this treated?   Treatment may include: ? Taking good care of yourself. Be sure to rest as needed, eat a healthy diet, and exercise. ? Medicines. These may include:  Pain relievers.  Medicines to help with inflammation.  Disease-modifying antirheumatic drugs (DMARDs).  Medicines called biologic response modifiers. ? Physical therapy and occupational therapy. ? Surgery, if joint damage is very bad. Your doctor will work with you to find the best treatments. Follow these instructions at home: Activity  Return to your normal activities as told by your doctor. Ask your doctor what activities are safe for you.  Rest when you have a flare.  Exercise as told by your doctor. General instructions  Take over-the-counter and prescription medicines only as told by your doctor.  Keep all follow-up visits as told by your doctor. This is important. Where to find more information  [employer]: www.rheumatology.org  Arthritis  Foundation: Administrator.arthritis.org Contact a doctor if:  You have a flare.  You have a fever.  You have problems because of your medicines. Get help right away if:  You have chest pain.  You have trouble breathing.  You get a hot, painful joint all of a sudden, and it is worse than your normal joint aches. Summary  RA is a long-term disease.  Symptoms of RA start slowly. They are often worse in the morning.  RA causes inflammation in your  joints. This information is not intended to replace advice given to you by your health care provider. Make sure you discuss any questions you have with your health care provider. Document Released: 08/14/2011 Document Revised: 01/23/2018 Document Reviewed: 01/23/2018 Elsevier Interactive Patient Education  2019 [employer].

## 2018-05-23 NOTE — Progress Notes (Signed)
John Mcclain 62 y.o.   Chief Complaint  Patient presents with  . Medication Refill    Lorazepam    HISTORY OF PRESENT ILLNESS: This is a 62 y.o. male with multiple medical problems including rheumatoid arthritis, osteoarthritis, hypertension, chronic migraine disorder, chronic anxiety, here to establish care with me.  Needs medication refill.  Used to see PA Carlis Abbott.  Has no complaints or medical concerns today. AOK. HPI   Prior to Admission medications   Medication Sig Start Date End Date Taking? Authorizing Provider  alendronate (FOSAMAX) 70 MG tablet Take 70 mg by mouth once a week. Take with a full glass of water on an empty stomach.   Yes [provider]  amLODipine (NORVASC) 2.5 MG tablet Take 1 tablet (2.5 mg total) by mouth daily. 07/19/17  Yes Tereasa Coop, PA-C  Calcium Carbonate-Vitamin D (CALCIUM 600+D PO) Take by mouth daily.   Yes [provider]  clobetasol ointment (TEMOVATE) 2.59 % Apply 1 application topically 2 (two) times daily. Reported on 09/28/2015   Yes [provider]  cyclobenzaprine (FLEXERIL) 10 MG tablet Take 10 mg by mouth 3 (three) times daily as needed.   Yes [provider]  Erenumab-aooe (AIMOVIG) 50 MG/ML SOAJ Inject into the skin.   Yes [provider]  etanercept (ENBREL) 50 MG/ML injection Inject 50 mg into the skin once a week. Every Monday   Yes [provider]  folic acid (FOLVITE) 1 MG tablet Take 1 mg by mouth daily.   Yes [provider]  imipramine (TOFRANIL) 25 MG tablet Take 50 mg by mouth 2 (two) times daily.    Yes [provider]  levETIRAcetam (KEPPRA) 750 MG tablet Take 750 mg by mouth 2 (two) times daily.    Yes [provider]  lisinopril (PRINIVIL,ZESTRIL) 10 MG tablet Take 1 tablet (10 mg total) by mouth daily. 05/07/17  Yes Tereasa Coop, PA-C  LORazepam (ATIVAN) 1 MG tablet Take 0.5-1 tablets (0.5-1 mg total) by mouth daily as needed for anxiety.  Use as sparingly as possible. 12/11/17  Yes Tereasa Coop, PA-C  magic mouthwash SOLN Take 5 mLs by mouth 4 (four) times daily. 09/28/15  Yes English, Colletta [place] D, PA  methotrexate (RHEUMATREX) 2.5 MG tablet Take 2.5 mg by mouth once a week. Taking five 21/2 mg tablets every Monday   Yes [provider]  OVER THE COUNTER MEDICATION Reported on 09/01/2015   Yes [provider]  polyethylene glycol powder (GLYCOLAX/MIRALAX) powder Take 17 g by mouth daily. 05/24/17  Yes Tereasa Coop, PA-C  predniSONE (DELTASONE) 5 MG tablet Take 5 mg by mouth as needed.   Yes [provider]  ranitidine (ZANTAC) 150 MG tablet Take 150 mg by mouth 2 (two) times daily.   Yes [provider]  rosuvastatin (CRESTOR) 5 MG tablet Take 1 tablet (5 mg total) by mouth daily. 07/19/17  Yes Tereasa Coop, PA-C  sildenafil (REVATIO) 20 MG tablet Take 3-5 tablets (60-100 mg total) by mouth 3 (three) times daily. 05/07/17  Yes Tereasa Coop, PA-C  valACYclovir (VALTREX) 1000 MG tablet TAKE TWO TABLET BY MOUTH TWICE DAILY 01/15/17  Yes Ivar Drape D, PA    Allergies  Allergen Reactions  . Penicillins     Patient Active Problem List   Diagnosis Date Noted  . Hemorrhoids, thrombosed 02/06/2013  . Hypogonadism male 12/26/2011  . Vitamin D deficiency 12/26/2011  . Rheumatoid arthritis(714.0) 12/26/2011  . Migraine headache disorder 12/26/2011  .  Anxiety disorder 12/26/2011  . HTN (hypertension), benign 12/26/2011    Past Medical History:  Diagnosis Date  . Anxiety   . Arthritis   . Chronic migraine   . Depression   . Diabetes mellitus without complication ([place])   . Heart murmur   . Hypertension   . Kidney stones   . Raynaud's disease     Past Surgical History:  Procedure Laterality Date  . COLON SURGERY    . LYMPH NODE DISSECTION     removed lymph node  . VASECTOMY      Social History   Socioeconomic History  . Marital status: Married    Spouse name:  Not on file  . Number of children: Not on file  . Years of education: Not on file  . Highest education level: Not on file  Occupational History  . Not on file  Social Needs  . Financial resource strain: Not on file  . Food insecurity:    Worry: Not on file    Inability: Not on file  . Transportation needs:    Medical: Not on file    Non-medical: Not on file  Tobacco Use  . Smoking status: Never Smoker  . Smokeless tobacco: Never Used  Substance and Sexual Activity  . Alcohol use: Yes    Comment: 2-3 per week  . Drug use: No  . Sexual activity: Yes    Partners: Female  Lifestyle  . Physical activity:    Days per week: Not on file    Minutes per session: Not on file  . Stress: Not on file  Relationships  . Social connections:    Talks on phone: Not on file    Gets together: Not on file    Attends religious service: Not on file    Active member of club or organization: Not on file    Attends meetings of clubs or organizations: Not on file    Relationship status: Not on file  . Intimate partner violence:    Fear of current or ex partner: Not on file    Emotionally abused: Not on file    Physically abused: Not on file    Forced sexual activity: Not on file  Other Topics Concern  . Not on file  Social History Narrative   Married. Education: [employer].     Family History  Problem Relation Age of Onset  . Kidney disease Mother   . Hypertension Mother   . Arthritis Mother        RA  . COPD Mother   . Hyperlipidemia Mother   . Heart attack Father   . Diabetes Father   . COPD Father   . Hypertension Father   . Migraines Brother   . Stroke Maternal Grandfather   . Alzheimer's disease Paternal Grandmother      Review of Systems  Constitutional: Negative.  Negative for chills, fever and malaise/fatigue.  HENT: Negative.  Negative for nosebleeds and sore throat.   Eyes: Negative for blurred vision and double vision.  Respiratory: Negative.  Negative for cough and  shortness of breath.   Cardiovascular: Negative.  Negative for chest pain and palpitations.  Gastrointestinal: Negative.  Negative for abdominal pain, diarrhea, nausea and vomiting.  Genitourinary: Negative.  Negative for dysuria and hematuria.  Musculoskeletal: Positive for back pain, joint pain and neck pain.  Skin: Negative.  Negative for rash.  Neurological: Positive for headaches (Chronic migraine headaches).       Intermittent tremors  Endo/Heme/Allergies: Negative.  All other systems reviewed and are negative.   Vitals:   05/23/18 1351  BP: 127/74  Pulse: (!) 102  Resp: 16  Temp: 98.8 F (37.1 C)  SpO2: 97%    Physical Exam Vitals signs reviewed.  Constitutional:      Appearance: Normal appearance.  HENT:     Head: Normocephalic and atraumatic.     Nose: Nose normal.     Mouth/Throat:     Mouth: Mucous membranes are moist.     Pharynx: Oropharynx is clear.  Eyes:     Extraocular Movements: Extraocular movements intact.     Conjunctiva/sclera: Conjunctivae normal.     Pupils: Pupils are equal, round, and reactive to light.  Neck:     Musculoskeletal: Normal range of motion and neck supple.  Cardiovascular:     Rate and Rhythm: Normal rate and regular rhythm.     Pulses: Normal pulses.     Heart sounds: Normal heart sounds.  Pulmonary:     Effort: Pulmonary effort is normal.     Breath sounds: Normal breath sounds.  Musculoskeletal: Normal range of motion.  Skin:    General: Skin is warm and dry.     Capillary Refill: Capillary refill takes less than 2 seconds.  Neurological:     General: No focal deficit present.     Mental Status: He is alert and oriented to person, place, and time.     Sensory: No sensory deficit.     Motor: No weakness.     Coordination: Coordination normal.  Psychiatric:        Mood and Affect: Mood normal.        Behavior: Behavior normal.      ASSESSMENT & PLAN: [place] was seen today for medication refill.  Diagnoses and  all orders for this visit:  Rheumatoid arthritis involving multiple sites, unspecified rheumatoid factor presence (HCC)  Anxiety disorder, unspecified type -     LORazepam (ATIVAN) 1 MG tablet; Take 0.5-1 tablets (0.5-1 mg total) by mouth daily as needed for anxiety. Use as sparingly as possible.  Dyslipidemia  Essential hypertension  Other migraine without status migrainosus, not intractable  Osteoarthritis of multiple joints, unspecified osteoarthritis type    Patient Instructions       If you have lab work done today you will be contacted with your lab results within the next 2 weeks.  If you have not heard from [place] then please contact us. The fastest way to get your results is to register for My Chart.   IF you received an x-ray today, you will receive an invoice from [facility]. Please contact [facility] at [phone] with questions or concerns regarding your invoice.   IF you received labwork today, you will receive an invoice from [place]. Please contact [facility] at [phone] with questions or concerns regarding your invoice.   Our billing staff will not be able to assist you with questions regarding bills from these companies.  You will be contacted with the lab results as soon as they are available. The fastest way to get your results is to activate your My Chart account. Instructions are located on the last page of this paperwork. If you have not heard from [place] regarding the results in 2 weeks, please contact this office.     Rheumatoid Arthritis Rheumatoid arthritis (RA) is a long-term (chronic) disease. RA causes inflammation in your joints. Your joints may feel painful, stiff, swollen, and warm. RA may start slowly. It most often affects  the small joints of the hands and feet. It can also affect other parts of the body. Symptoms of RA often come and go. There is no cure for RA, but medicines can help your symptoms. What are the causes?  RA  is an autoimmune disease. This means that your body's defense system (immune system) attacks healthy parts of your body by mistake. The exact cause of RA is not known. What increases the risk?  Being a woman.  Having a family history of RA or other diseases like RA.  Smoking.  Being overweight.  Being exposed to pollutants or chemicals. What are the signs or symptoms?  Morning stiffness that lasts longer than 30 minutes. This is often the first symptom.  Symptoms start slowly. They are often worse in the morning.  As RA gets worse, symptoms may include: ? Pain, stiffness, swelling, warmth, and tenderness in joints on both sides of your body. ? Loss of energy. ? Not feeling hungry. ? Weight loss. ? A low fever. ? Dry eyes and a dry mouth. ? Firm lumps that grow under your skin. ? Changes in the way your joints look. ? Changes in the way your joints work.  Symptoms vary and they: ? Often come and go. ? Sometimes get worse for a period of time. These are called flares. How is this treated?   Treatment may include: ? Taking good care of yourself. Be sure to rest as needed, eat a healthy diet, and exercise. ? Medicines. These may include:  Pain relievers.  Medicines to help with inflammation.  Disease-modifying antirheumatic drugs (DMARDs).  Medicines called biologic response modifiers. ? Physical therapy and occupational therapy. ? Surgery, if joint damage is very bad. Your doctor will work with you to find the best treatments. Follow these instructions at home: Activity  Return to your normal activities as told by your doctor. Ask your doctor what activities are safe for you.  Rest when you have a flare.  Exercise as told by your doctor. General instructions  Take over-the-counter and prescription medicines only as told by your doctor.  Keep all follow-up visits as told by your doctor. This is important. Where to find more information  [employer] of  Rheumatology: www.rheumatology.[place]: www.arthritis.org Contact a doctor if:  You have a flare.  You have a fever.  You have problems because of your medicines. Get help right away if:  You have chest pain.  You have trouble breathing.  You get a hot, painful joint all of a sudden, and it is worse than your normal joint aches. Summary  RA is a long-term disease.  Symptoms of RA start slowly. They are often worse in the morning.  RA causes inflammation in your joints. This information is not intended to replace advice given to you by your health care provider. Make sure you discuss any questions you have with your health care provider. Document Released: 08/14/2011 Document Revised: 01/23/2018 Document Reviewed: 01/23/2018 Elsevier Interactive Patient Education  2019 Elsevier Inc.      Agustina Caroli, MD [facility]

## 2018-05-23 NOTE — Addendum Note (Signed)
Addended by: Alfredia Ferguson A on: 05/23/2018 02:50 PM   Modules accepted: Orders

## 2018-05-30 ENCOUNTER — Encounter: Payer: Self-pay | Admitting: Emergency Medicine

## 2018-05-30 ENCOUNTER — Other Ambulatory Visit: Payer: Self-pay | Admitting: Physician Assistant

## 2018-05-30 DIAGNOSIS — I10 Essential (primary) hypertension: Secondary | ICD-10-CM

## 2018-05-31 NOTE — Telephone Encounter (Signed)
Called and spoke with pt regarding requested refills./ I advised that all RX's were refilled and if he needed an appt then to give [place] a call. Pt acknowledged.

## 2018-06-18 DIAGNOSIS — G43019 Migraine without aura, intractable, without status migrainosus: Secondary | ICD-10-CM | POA: Diagnosis not present

## 2018-06-18 DIAGNOSIS — G43719 Chronic migraine without aura, intractable, without status migrainosus: Secondary | ICD-10-CM | POA: Diagnosis not present

## 2018-06-18 DIAGNOSIS — R51 Headache: Secondary | ICD-10-CM | POA: Diagnosis not present

## 2018-06-18 DIAGNOSIS — G518 Other disorders of facial nerve: Secondary | ICD-10-CM | POA: Diagnosis not present

## 2018-07-16 DIAGNOSIS — J069 Acute upper respiratory infection, unspecified: Secondary | ICD-10-CM | POA: Diagnosis not present

## 2018-07-16 DIAGNOSIS — M1990 Unspecified osteoarthritis, unspecified site: Secondary | ICD-10-CM | POA: Diagnosis not present

## 2018-07-16 DIAGNOSIS — M069 Rheumatoid arthritis, unspecified: Secondary | ICD-10-CM | POA: Diagnosis not present

## 2018-07-16 DIAGNOSIS — M549 Dorsalgia, unspecified: Secondary | ICD-10-CM | POA: Diagnosis not present

## 2018-07-16 DIAGNOSIS — Z79899 Other long term (current) drug therapy: Secondary | ICD-10-CM | POA: Diagnosis not present

## 2018-07-16 DIAGNOSIS — R768 Other specified abnormal immunological findings in serum: Secondary | ICD-10-CM | POA: Diagnosis not present

## 2018-07-16 DIAGNOSIS — M25439 Effusion, unspecified wrist: Secondary | ICD-10-CM | POA: Diagnosis not present

## 2018-07-16 DIAGNOSIS — M810 Age-related osteoporosis without current pathological fracture: Secondary | ICD-10-CM | POA: Diagnosis not present

## 2018-08-07 ENCOUNTER — Other Ambulatory Visit: Payer: Self-pay | Admitting: Emergency Medicine

## 2018-08-07 DIAGNOSIS — I10 Essential (primary) hypertension: Secondary | ICD-10-CM

## 2018-08-07 MED ORDER — LISINOPRIL 10 MG PO TABS: 10.0000 mg | ORAL_TABLET | Freq: Every day | ORAL | 0 refills | Status: DC

## 2018-08-07 NOTE — Telephone Encounter (Signed)
Copied from [place] [id number]. Topic: Quick Communication - Rx Refill/Question >> Aug 07, 2018 12:06 PM Bea Graff, NT wrote: Medication: lisinopril (PRINIVIL,ZESTRIL) 10 MG tablet 90 day rx  Has the patient contacted their pharmacy? Yes.   (Agent: If no, request that the patient contact the pharmacy for the refill.) (Agent: If yes, when and what did the pharmacy advise?)  Preferred Pharmacy (with phone number or street name): [place], [place] [phone] (Phone) [phone] (Fax)    Agent: Please be advised that RX refills may take up to 3 business days. We ask that you follow-up with your pharmacy.

## 2018-08-29 ENCOUNTER — Telehealth: Payer: Self-pay | Admitting: *Deleted

## 2018-08-29 NOTE — Telephone Encounter (Signed)
Schedule AWV.  

## 2018-08-30 NOTE — Telephone Encounter (Signed)
Pt called back to speak with julie. Please call back.

## 2018-08-31 NOTE — Telephone Encounter (Signed)
Please advise 

## 2018-09-04 ENCOUNTER — Telehealth: Payer: Self-pay | Admitting: *Deleted

## 2018-09-04 NOTE — Telephone Encounter (Signed)
Patient declined AWV 

## 2018-10-31 ENCOUNTER — Telehealth: Payer: Self-pay | Admitting: Family Medicine

## 2018-10-31 DIAGNOSIS — I10 Essential (primary) hypertension: Secondary | ICD-10-CM

## 2018-11-05 ENCOUNTER — Telehealth: Payer: Self-pay | Admitting: Emergency Medicine

## 2018-11-05 DIAGNOSIS — M1990 Unspecified osteoarthritis, unspecified site: Secondary | ICD-10-CM | POA: Diagnosis not present

## 2018-11-05 DIAGNOSIS — M25439 Effusion, unspecified wrist: Secondary | ICD-10-CM | POA: Diagnosis not present

## 2018-11-05 DIAGNOSIS — R768 Other specified abnormal immunological findings in serum: Secondary | ICD-10-CM | POA: Diagnosis not present

## 2018-11-05 DIAGNOSIS — J069 Acute upper respiratory infection, unspecified: Secondary | ICD-10-CM | POA: Diagnosis not present

## 2018-11-05 DIAGNOSIS — M549 Dorsalgia, unspecified: Secondary | ICD-10-CM | POA: Diagnosis not present

## 2018-11-05 DIAGNOSIS — M069 Rheumatoid arthritis, unspecified: Secondary | ICD-10-CM | POA: Diagnosis not present

## 2018-11-05 DIAGNOSIS — M810 Age-related osteoporosis without current pathological fracture: Secondary | ICD-10-CM | POA: Diagnosis not present

## 2018-11-05 DIAGNOSIS — Z79899 Other long term (current) drug therapy: Secondary | ICD-10-CM | POA: Diagnosis not present

## 2018-11-05 NOTE — Telephone Encounter (Signed)
Called pt and informed him he needs to schedule an appointment for med refills. He verbalized understanding and have an appointment sent for 11/14/2018. Could refills be sent into pharmacy until appointment?

## 2018-11-05 NOTE — Telephone Encounter (Signed)
These are the medications he need refilled

## 2018-11-05 NOTE — Telephone Encounter (Signed)
Copied from [place] [id number]. Topic: Quick Communication - Rx Refill/Question >> Nov 05, 2018  2:14 PM Sallee Provencal, Zaccai Chavarin B, NT wrote: Medication: rosuvastatin (CRESTOR) 5 MG tablet  LORazepam (ATIVAN) 1 MG tablet   Has the patient contacted their pharmacy? yes (Agent: If no, request that the patient contact the pharmacy for the refill.) (Agent: If yes, when and what did the pharmacy advise?)  Preferred Pharmacy (with phone number or street name): [place], [place]  Agent: Please be advised that RX refills may take up to 3 business days. We ask that you follow-up with your pharmacy.

## 2018-11-05 NOTE — Telephone Encounter (Signed)
Patient calling to check the status of medication refill. Advised patient that it was denied due to needing an appointment. States that he will be getting labs drawn at him rheumatologist next week if labs were what are needed. CB#: [phone]

## 2018-11-06 ENCOUNTER — Other Ambulatory Visit: Payer: Self-pay | Admitting: Emergency Medicine

## 2018-11-06 DIAGNOSIS — F419 Anxiety disorder, unspecified: Secondary | ICD-10-CM

## 2018-11-06 DIAGNOSIS — Z299 Encounter for prophylactic measures, unspecified: Secondary | ICD-10-CM

## 2018-11-06 MED ORDER — LORAZEPAM 1 MG PO TABS: 0.5000 mg | ORAL_TABLET | Freq: Every day | ORAL | 0 refills | Status: DC | PRN

## 2018-11-06 MED ORDER — ROSUVASTATIN CALCIUM 5 MG PO TABS: 5.0000 mg | ORAL_TABLET | Freq: Every day | ORAL | 0 refills | Status: DC

## 2018-11-06 NOTE — Telephone Encounter (Signed)
Done. Thanks.

## 2018-11-06 NOTE — Telephone Encounter (Signed)
Prescriptions sent to pharmacy of record.  Thanks.

## 2018-11-12 ENCOUNTER — Other Ambulatory Visit: Payer: Self-pay

## 2018-11-12 ENCOUNTER — Telehealth: Payer: Self-pay | Admitting: *Deleted

## 2018-11-12 ENCOUNTER — Ambulatory Visit (INDEPENDENT_AMBULATORY_CARE_PROVIDER_SITE_OTHER): Payer: Medicare PPO | Admitting: Emergency Medicine

## 2018-11-12 ENCOUNTER — Encounter: Payer: Self-pay | Admitting: Emergency Medicine

## 2018-11-12 VITALS — BP 130/80 | HR 94 | Temp 98.3°F | Resp 16 | Ht 65.5 in | Wt 160.0 lb

## 2018-11-12 DIAGNOSIS — L0100 Impetigo, unspecified: Secondary | ICD-10-CM | POA: Diagnosis not present

## 2018-11-12 DIAGNOSIS — I10 Essential (primary) hypertension: Secondary | ICD-10-CM | POA: Diagnosis not present

## 2018-11-12 MED ORDER — AMLODIPINE BESYLATE 2.5 MG PO TABS: 2.5000 mg | ORAL_TABLET | Freq: Every day | ORAL | 3 refills | Status: DC

## 2018-11-12 MED ORDER — DOXYCYCLINE HYCLATE 100 MG PO TABS: 100.0000 mg | ORAL_TABLET | Freq: Two times a day (BID) | ORAL | 0 refills | Status: DC

## 2018-11-12 MED ORDER — LISINOPRIL 10 MG PO TABS: 10.0000 mg | ORAL_TABLET | Freq: Every day | ORAL | 3 refills | Status: DC

## 2018-11-12 NOTE — Telephone Encounter (Signed)
[place] to cancel prescription for Doxycycline, spoke to [place]. Patient changed to local pharmacy [facility] on [address] for the medication, the other medication will remained at [facility] for home delivery.

## 2018-11-12 NOTE — Patient Instructions (Addendum)
If you have lab work done today you will be contacted with your lab results within the next 2 weeks.  If you have not heard from [place] then please contact us. The fastest way to get your results is to register for My Chart.   IF you received an x-ray today, you will receive an invoice from [facility]. Please contact [facility] at [phone] with questions or concerns regarding your invoice.   IF you received labwork today, you will receive an invoice from [place]. Please contact [facility] at [phone] with questions or concerns regarding your invoice.   Our billing staff will not be able to assist you with questions regarding bills from these companies.  You will be contacted with the lab results as soon as they are available. The fastest way to get your results is to activate your My Chart account. Instructions are located on the last page of this paperwork. If you have not heard from [place] regarding the results in 2 weeks, please contact this office.     Impetigo, Adult Impetigo is an infection of the skin. It commonly occurs in young children, but it can also occur in adults. The infection causes itchy blisters and sores that produce brownish-yellow fluid. As the fluid dries, it forms a thick, honey-colored crust. These skin changes usually occur on the face, but they can also affect other areas of the body. Impetigo usually goes away in 7-10 days with treatment. What are the causes? This condition is caused by two types of bacteria. It may be caused by staphylococci or streptococci bacteria. These bacteria cause impetigo when they get under the surface of the skin. This often happens after some damage to the skin, such as:  Cuts, scrapes, or scratches.  Rashes.  Insect bites, especially when you scratch the area of a bite.  Chickenpox or other illnesses that cause open skin sores.  Nail biting or chewing. Impetigo can spread easily from one person to another  (is contagious). It may be spread through close skin contact or by sharing towels, clothing, or other items that an infected person has touched. What increases the risk? The following factors may make you more likely to develop this condition:  Playing sports that include skin-to-skin contact with others.  Having a skin condition with open sores, such as chickenpox.  Having diabetes.  Having a weak body defense system (immune system).  Having many skin cuts or scrapes.  Living in an area that has high humidity levels.  Having poor hygiene.  Having high levels of staphylococci in your nose. What are the signs or symptoms? The main symptom of this condition is small blisters, often on the face around the mouth and nose. In time, the blisters break open and turn into tiny sores (lesions) with a yellow crust. In some cases, the blisters cause itching or burning. With scratching, irritation, or lack of treatment, these small lesions may get larger. Other possible symptoms include:  Larger blisters.  Pus.  Swollen lymph glands. Scratching the affected area can cause impetigo to spread to other parts of the body. The bacteria can get under the fingernails and spread when you touch another area of your skin. How is this diagnosed? This condition is usually diagnosed during a physical exam. A skin sample or a sample of fluid from a blister may be taken for lab tests that involve growing bacteria (culture test). Lab tests can help to confirm the diagnosis or help to determine the  best treatment. How is this treated? Treatment for this condition depends on the severity of the condition:  Mild impetigo can be treated with prescription antibiotic cream.  Oral antibiotic medicine may be used in more severe cases.  Medicines that reduce itchiness (antihistamines)may also be used. Follow these instructions at home: Medicines  Take over-the-counter and prescription medicines only as told by  your health care provider.  Apply or take your antibiotic as told by your health care provider. Do not stop using the antibiotic even if your condition improves. General instructions   To help prevent impetigo from spreading to other body areas: ? Keep your fingernails short and clean. ? Do not scratch the blisters or sores. ? Cover infected areas, if necessary, to keep from scratching. ? Wash your hands often with soap and warm water.  Before applying antibiotic cream or ointment, you should: ? Gently wash the infected areas with antibacterial soap and warm water. ? Soak crusted areas in warm, soapy water using antibacterial soap. ? Gently rub the areas to remove crusts. Do not scrub.  Do not share towels.  Wash your clothing and bedsheets in warm water that is 140F (60C) or warmer.  Stay home until you have used an antibiotic cream for 48 hours (2 days) or an oral antibiotic medicine for 24 hours (1 day). You should only return to work and activities with other people if your skin shows significant improvement. ? You may return to contact sports after you have used antibiotic medicine for 72 hours (3 days).  Keep all follow-up visits as told by your health care provider. This is important. How is this prevented?  Wash your hands often with soap and warm water.  Do not share towels, washcloths, clothing, bedding, or razors.  Keep your fingernails short.  Keep any cuts, scrapes, bug bites, or rashes clean and covered.  Use insect repellent to prevent bug bites. Contact a health care provider if:  You develop more blisters or sores even with treatment.  Other family members get sores.  Your skin sores are not improving after 72 hours (3 days) of treatment.  You have a fever. Get help right away if:  You see spreading redness or swelling of the skin around your sores.  You see red streaks coming from your sores.  You develop a sore throat.  The area around your  rash becomes warm, red, or tender to the touch.  You have dark, reddish-brown urine.  You do not urinate often or you urinate small amounts.  You are very tired (lethargic).  You have swelling in the face, hands, or feet. Summary  Impetigo is a skin infection that causes itchy blisters and sores that produce brownish-yellow fluid. As the fluid dries, it forms a crust.  This condition is caused by staphylococci or streptococci bacteria. These bacteria cause impetigo when they get under the surface of the skin, such as through cuts, rashes, bug bites, or open sores.  Treatment for this condition may include antibiotic ointment or oral antibiotics.  To help prevent impetigo from spreading to other body areas, make sure you keep your fingernails short, avoid scratching, cover any blisters, and wash your hands often.  If you have impetigo, stay home until you have used an antibiotic cream for 48 hours (2 days) or an oral antibiotic medicine for 24 hours (1 day). You should only return to work and activities with other people if your skin shows significant improvement. This information is not intended to  replace advice given to you by your health care provider. Make sure you discuss any questions you have with your health care provider. Document Released: 06/12/2014 Document Revised: 06/13/2016 Document Reviewed: 06/13/2016 Elsevier Interactive Patient Education  2019 [employer].

## 2018-11-12 NOTE — Progress Notes (Signed)
John Mcclain 63 y.o.   Chief Complaint  Patient presents with  . Medication Refill    amlodipine and lisinopril  . Rash    on head and neck with soreness and tenderness x 6-8 months    HISTORY OF PRESENT ILLNESS: This is a 63 y.o. male with history of hypertension here for follow-up and medication refill.  He is taking amlodipine and lisinopril.  Has no complaints. Also complaining of rash to his scalp and back of his neck for the past several months.  No other significant symptoms.  HPI   Prior to Admission medications   Medication Sig Start Date End Date Taking? Authorizing Provider  alendronate (FOSAMAX) 70 MG tablet Take 70 mg by mouth once a week. Take with a full glass of water on an empty stomach.   Yes [provider]  amLODipine (NORVASC) 2.5 MG tablet Take 1 tablet (2.5 mg total) by mouth daily. 11/12/18  Yes Horald Pollen, MD  aspirin EC 81 MG tablet Take 81 mg by mouth daily.   Yes [provider]  Calcium Carbonate-Vitamin D (CALCIUM 600+D PO) Take by mouth daily.   Yes [provider]  clobetasol ointment (TEMOVATE) 2.99 % Apply 1 application topically 2 (two) times daily. Reported on 09/28/2015   Yes [provider]  cyclobenzaprine (FLEXERIL) 10 MG tablet Take 10 mg by mouth 3 (three) times daily as needed.   Yes [provider]  Erenumab-aooe (AIMOVIG) 61 MG/ML SOAJ Inject into the skin.   Yes [provider]  etanercept (ENBREL) 50 MG/ML injection Inject 50 mg into the skin once a week. Every Monday   Yes [provider]  folic acid (FOLVITE) 1 MG tablet Take 1 mg by mouth daily.   Yes [provider]  imipramine (TOFRANIL) 25 MG tablet Take 50 mg by mouth 2 (two) times daily.    Yes [provider]  levETIRAcetam (KEPPRA) 750 MG tablet Take 750 mg by mouth 2 (two) times daily.    Yes [provider]  lisinopril (ZESTRIL) 10 MG tablet Take 1 tablet (10 mg total) by mouth  daily. 11/12/18  Yes Daveena Elmore, Ines Bloomer, MD  LORazepam (ATIVAN) 1 MG tablet Take 0.5-1 tablets (0.5-1 mg total) by mouth daily as needed for anxiety. Use as sparingly as possible. 11/06/18  Yes Kalynn Declercq, Ines Bloomer, MD  magic mouthwash SOLN Take 5 mLs by mouth 4 (four) times daily. 09/28/15  Yes Ivar Drape D, PA  Magnesium 400 MG TABS Take by mouth daily.   Yes [provider]  methotrexate (RHEUMATREX) 2.5 MG tablet Take 2.5 mg by mouth once a week. Taking five 21/2 mg tablets every Monday   Yes [provider]  OVER THE COUNTER MEDICATION Reported on 09/01/2015   Yes [provider]  polyethylene glycol powder (GLYCOLAX/MIRALAX) powder Take 17 g by mouth daily. 05/24/17  Yes Tereasa Coop, PA-C  predniSONE (DELTASONE) 5 MG tablet Take 5 mg by mouth as needed.   Yes [provider]  ranitidine (ZANTAC) 150 MG tablet Take 150 mg by mouth 2 (two) times daily.   Yes [provider]  rosuvastatin (CRESTOR) 5 MG tablet Take 1 tablet (5 mg total) by mouth daily for 30 days. 11/06/18 12/06/18 Yes Tyr Franca, Ines Bloomer, MD  sildenafil (REVATIO) 20 MG tablet Take 3-5 tablets (60-100 mg total) by mouth 3 (three) times daily. 05/07/17  Yes Tereasa Coop, PA-C  Upadacitinib ER (RINVOQ) 15 MG TB24 Take by mouth daily.  Yes [provider]  doxycycline (VIBRA-TABS) 100 MG tablet Take 1 tablet (100 mg total) by mouth 2 (two) times daily. 11/12/18   Horald Pollen, MD  valACYclovir (VALTREX) 1000 MG tablet TAKE TWO TABLET BY MOUTH TWICE DAILY Patient not taking: Reported on 11/12/2018 01/15/17   Ivar Drape D, PA    Allergies  Allergen Reactions  . Penicillins     Patient Active Problem List   Diagnosis Date Noted  . Hypogonadism male 12/26/2011  . Vitamin D deficiency 12/26/2011  . Rheumatoid arthritis ([place]) 12/26/2011  . Migraine headache disorder 12/26/2011  . Anxiety disorder 12/26/2011  . HTN (hypertension), benign 12/26/2011     Past Medical History:  Diagnosis Date  . Anxiety   . Arthritis   . Chronic migraine   . Depression   . Diabetes mellitus without complication ([place])   . Heart murmur   . Hypertension   . Kidney stones   . Raynaud's disease     Past Surgical History:  Procedure Laterality Date  . COLON SURGERY    . LYMPH NODE DISSECTION     removed lymph node  . VASECTOMY      Social History   Socioeconomic History  . Marital status: Married    Spouse name: Not on file  . Number of children: Not on file  . Years of education: Not on file  . Highest education level: Not on file  Occupational History  . Not on file  Social Needs  . Financial resource strain: Not on file  . Food insecurity:    Worry: Not on file    Inability: Not on file  . Transportation needs:    Medical: Not on file    Non-medical: Not on file  Tobacco Use  . Smoking status: Never Smoker  . Smokeless tobacco: Never Used  Substance and Sexual Activity  . Alcohol use: Yes    Comment: 2-3 per week  . Drug use: No  . Sexual activity: Yes    Partners: Female  Lifestyle  . Physical activity:    Days per week: Not on file    Minutes per session: Not on file  . Stress: Not on file  Relationships  . Social connections:    Talks on phone: Not on file    Gets together: Not on file    Attends religious service: Not on file    Active member of club or organization: Not on file    Attends meetings of clubs or organizations: Not on file    Relationship status: Not on file  . Intimate partner violence:    Fear of current or ex partner: Not on file    Emotionally abused: Not on file    Physically abused: Not on file    Forced sexual activity: Not on file  Other Topics Concern  . Not on file  Social History Narrative   Married. Education: [employer].     Family History  Problem Relation Age of Onset  . Kidney disease Mother   . Hypertension Mother   . Arthritis Mother        RA  . COPD Mother   .  Hyperlipidemia Mother   . Heart attack Father   . Diabetes Father   . COPD Father   . Hypertension Father   . Migraines Brother   . Stroke Maternal Grandfather   . Alzheimer's disease Paternal Grandmother      Review of Systems  Constitutional: Negative for chills and fever.  HENT: Negative for congestion and sore throat.   Eyes: Negative.  Negative for blurred vision.  Respiratory: Negative.  Negative for cough and shortness of breath.   Cardiovascular: Negative.  Negative for chest pain and palpitations.  Gastrointestinal: Negative for abdominal pain, nausea and vomiting.  Genitourinary: Negative for dysuria and hematuria.  Musculoskeletal: Positive for joint pain (Chronic).  Skin: Positive for rash.  Neurological: Negative for dizziness and headaches.  All other systems reviewed and are negative.  Vitals:   11/12/18 1507  BP: 130/80  Pulse: 94  Resp: 16  Temp: 98.3 F (36.8 C)  SpO2: 98%     Physical Exam Vitals signs reviewed.  Constitutional:      Appearance: Normal appearance.  HENT:     Head: Normocephalic.     Mouth/Throat:     Mouth: Mucous membranes are moist.     Pharynx: Oropharynx is clear.  Eyes:     Extraocular Movements: Extraocular movements intact.     Conjunctiva/sclera: Conjunctivae normal.     Pupils: Pupils are equal, round, and reactive to light.  Neck:     Musculoskeletal: Normal range of motion and neck supple.  Cardiovascular:     Rate and Rhythm: Normal rate.     Heart sounds: Normal heart sounds.  Pulmonary:     Effort: Pulmonary effort is normal.     Breath sounds: Normal breath sounds.  Musculoskeletal: Normal range of motion.  Skin:    General: Skin is warm and dry.     Capillary Refill: Capillary refill takes less than 2 seconds.     Findings: Rash (Scalp and posterior neck, see picture below) present.  Neurological:     General: No focal deficit present.     Mental Status: He is alert and oriented to person, place, and  time.  Psychiatric:        Mood and Affect: Mood normal.        Behavior: Behavior normal.          ASSESSMENT & PLAN: Leomar was seen today for medication refill and rash.  Diagnoses and all orders for this visit:  Essential hypertension -     amLODipine (NORVASC) 2.5 MG tablet; Take 1 tablet (2.5 mg total) by mouth daily. -     lisinopril (ZESTRIL) 10 MG tablet; Take 1 tablet (10 mg total) by mouth daily.  Impetigo -     doxycycline (VIBRA-TABS) 100 MG tablet; Take 1 tablet (100 mg total) by mouth 2 (two) times daily.    Patient Instructions       If you have lab work done today you will be contacted with your lab results within the next 2 weeks.  If you have not heard from [place] then please contact us. The fastest way to get your results is to register for My Chart.   IF you received an x-ray today, you will receive an invoice from [facility]. Please contact [facility] at [phone] with questions or concerns regarding your invoice.   IF you received labwork today, you will receive an invoice from [place] [facility]. Please contact [facility] at [phone] with questions or concerns regarding your invoice.   Our billing staff will not be able to assist you with questions regarding bills from these companies.  You will be contacted with the lab results as soon as they are available. The fastest way to get your results is to activate your My Chart account. Instructions are located on the last page of this paperwork. If you  have not heard from [place] regarding the results in 2 weeks, please contact this office.     Impetigo, Adult Impetigo is an infection of the skin. It commonly occurs in young children, but it can also occur in adults. The infection causes itchy blisters and sores that produce brownish-yellow fluid. As the fluid dries, it forms a thick, honey-colored crust. These skin changes usually occur on the face, but they can also affect other areas of the  body. Impetigo usually goes away in 7-10 days with treatment. What are the causes? This condition is caused by two types of bacteria. It may be caused by staphylococci or streptococci bacteria. These bacteria cause impetigo when they get under the surface of the skin. This often happens after some damage to the skin, such as:  Cuts, scrapes, or scratches.  Rashes.  Insect bites, especially when you scratch the area of a bite.  Chickenpox or other illnesses that cause open skin sores.  Nail biting or chewing. Impetigo can spread easily from one person to another (is contagious). It may be spread through close skin contact or by sharing towels, clothing, or other items that an infected person has touched. What increases the risk? The following factors may make you more likely to develop this condition:  Playing sports that include skin-to-skin contact with others.  Having a skin condition with open sores, such as chickenpox.  Having diabetes.  Having a weak body defense system (immune system).  Having many skin cuts or scrapes.  Living in an area that has high humidity levels.  Having poor hygiene.  Having high levels of staphylococci in your nose. What are the signs or symptoms? The main symptom of this condition is small blisters, often on the face around the mouth and nose. In time, the blisters break open and turn into tiny sores (lesions) with a yellow crust. In some cases, the blisters cause itching or burning. With scratching, irritation, or lack of treatment, these small lesions may get larger. Other possible symptoms include:  Larger blisters.  Pus.  Swollen lymph glands. Scratching the affected area can cause impetigo to spread to other parts of the body. The bacteria can get under the fingernails and spread when you touch another area of your skin. How is this diagnosed? This condition is usually diagnosed during a physical exam. A skin sample or a sample of fluid  from a blister may be taken for lab tests that involve growing bacteria (culture test). Lab tests can help to confirm the diagnosis or help to determine the best treatment. How is this treated? Treatment for this condition depends on the severity of the condition:  Mild impetigo can be treated with prescription antibiotic cream.  Oral antibiotic medicine may be used in more severe cases.  Medicines that reduce itchiness (antihistamines)may also be used. Follow these instructions at home: Medicines  Take over-the-counter and prescription medicines only as told by your health care provider.  Apply or take your antibiotic as told by your health care provider. Do not stop using the antibiotic even if your condition improves. General instructions   To help prevent impetigo from spreading to other body areas: ? Keep your fingernails short and clean. ? Do not scratch the blisters or sores. ? Cover infected areas, if necessary, to keep from scratching. ? Wash your hands often with soap and warm water.  Before applying antibiotic cream or ointment, you should: ? Gently wash the infected areas with antibacterial soap and warm water. ?  Soak crusted areas in warm, soapy water using antibacterial soap. ? Gently rub the areas to remove crusts. Do not scrub.  Do not share towels.  Wash your clothing and bedsheets in warm water that is 140F (60C) or warmer.  Stay home until you have used an antibiotic cream for 48 hours (2 days) or an oral antibiotic medicine for 24 hours (1 day). You should only return to work and activities with other people if your skin shows significant improvement. ? You may return to contact sports after you have used antibiotic medicine for 72 hours (3 days).  Keep all follow-up visits as told by your health care provider. This is important. How is this prevented?  Wash your hands often with soap and warm water.  Do not share towels, washcloths, clothing, bedding,  or razors.  Keep your fingernails short.  Keep any cuts, scrapes, bug bites, or rashes clean and covered.  Use insect repellent to prevent bug bites. Contact a health care provider if:  You develop more blisters or sores even with treatment.  Other family members get sores.  Your skin sores are not improving after 72 hours (3 days) of treatment.  You have a fever. Get help right away if:  You see spreading redness or swelling of the skin around your sores.  You see red streaks coming from your sores.  You develop a sore throat.  The area around your rash becomes warm, red, or tender to the touch.  You have dark, reddish-brown urine.  You do not urinate often or you urinate small amounts.  You are very tired (lethargic).  You have swelling in the face, hands, or feet. Summary  Impetigo is a skin infection that causes itchy blisters and sores that produce brownish-yellow fluid. As the fluid dries, it forms a crust.  This condition is caused by staphylococci or streptococci bacteria. These bacteria cause impetigo when they get under the surface of the skin, such as through cuts, rashes, bug bites, or open sores.  Treatment for this condition may include antibiotic ointment or oral antibiotics.  To help prevent impetigo from spreading to other body areas, make sure you keep your fingernails short, avoid scratching, cover any blisters, and wash your hands often.  If you have impetigo, stay home until you have used an antibiotic cream for 48 hours (2 days) or an oral antibiotic medicine for 24 hours (1 day). You should only return to work and activities with other people if your skin shows significant improvement. This information is not intended to replace advice given to you by your health care provider. Make sure you discuss any questions you have with your health care provider. Document Released: 06/12/2014 Document Revised: 06/13/2016 Document Reviewed: 06/13/2016  Elsevier Interactive Patient Education  2019 [employer]      Agustina Caroli, MD [facility]

## 2018-11-13 ENCOUNTER — Encounter: Payer: Self-pay | Admitting: Emergency Medicine

## 2018-11-13 LAB — CBC WITH DIFFERENTIAL/PLATELET
Basophils Absolute: 0 10*3/uL (ref 0.0–0.2)
Basos: 1 %
EOS (ABSOLUTE): 0 10*3/uL (ref 0.0–0.4)
Eos: 0 %
Hematocrit: 48.3 % (ref 37.5–51.0)
Hemoglobin: 16.4 g/dL (ref 13.0–17.7)
Immature Grans (Abs): 0 10*3/uL (ref 0.0–0.1)
Immature Granulocytes: 0 %
Lymphocytes Absolute: 2.2 10*3/uL (ref 0.7–3.1)
Lymphs: 32 %
MCH: 34.5 pg — ABNORMAL HIGH (ref 26.6–33.0)
MCHC: 34 g/dL (ref 31.5–35.7)
MCV: 102 fL — ABNORMAL HIGH (ref 79–97)
Monocytes Absolute: 0.5 10*3/uL (ref 0.1–0.9)
Monocytes: 8 %
Neutrophils Absolute: 4.1 10*3/uL (ref 1.4–7.0)
Neutrophils: 59 %
Platelets: 290 10*3/uL (ref 150–450)
RBC: 4.76 x10E6/uL (ref 4.14–5.80)
RDW: 14.4 % (ref 11.6–15.4)
WBC: 6.8 10*3/uL (ref 3.4–10.8)

## 2018-11-13 LAB — LIPID PANEL
Chol/HDL Ratio: 2.3 ratio (ref 0.0–5.0)
Cholesterol, Total: 164 mg/dL (ref 100–199)
HDL: 72 mg/dL (ref >39)
LDL Calculated: 66 mg/dL (ref 0–99)
Triglycerides: 130 mg/dL (ref 0–149)
VLDL Cholesterol Cal: 26 mg/dL (ref 5–40)

## 2018-11-13 LAB — COMPREHENSIVE METABOLIC PANEL
ALT: 29 IU/L (ref 0–44)
AST: 31 IU/L (ref 0–40)
Albumin/Globulin Ratio: 2.3 — ABNORMAL HIGH (ref 1.2–2.2)
Albumin: 4.8 g/dL (ref 3.8–4.8)
Alkaline Phosphatase: 30 IU/L — ABNORMAL LOW (ref 39–117)
BUN/Creatinine Ratio: 10 (ref 10–24)
BUN: 11 mg/dL (ref 8–27)
Bilirubin Total: 0.4 mg/dL (ref 0.0–1.2)
CO2: 23 mmol/L (ref 20–29)
Calcium: 10.1 mg/dL (ref 8.6–10.2)
Chloride: 104 mmol/L (ref 96–106)
Creatinine, Ser: 1.12 mg/dL (ref 0.76–1.27)
GFR calc Af Amer: 79 mL/min/{1.73_m2} (ref >59)
GFR calc non Af Amer: 69 mL/min/{1.73_m2} (ref >59)
Globulin, Total: 2.1 g/dL (ref 1.5–4.5)
Glucose: 101 mg/dL — ABNORMAL HIGH (ref 65–99)
Potassium: 4.7 mmol/L (ref 3.5–5.2)
Sodium: 141 mmol/L (ref 134–144)
Total Protein: 6.9 g/dL (ref 6.0–8.5)

## 2018-11-13 LAB — HEMOGLOBIN A1C
Est. average glucose Bld gHb Est-mCnc: 128 mg/dL
Hgb A1c MFr Bld: 6.1 % — ABNORMAL HIGH (ref 4.8–5.6)

## 2018-11-14 ENCOUNTER — Telehealth: Payer: Self-pay | Admitting: Emergency Medicine

## 2018-11-19 DIAGNOSIS — G43019 Migraine without aura, intractable, without status migrainosus: Secondary | ICD-10-CM | POA: Diagnosis not present

## 2018-11-19 DIAGNOSIS — M069 Rheumatoid arthritis, unspecified: Secondary | ICD-10-CM | POA: Diagnosis not present

## 2018-11-19 DIAGNOSIS — G43719 Chronic migraine without aura, intractable, without status migrainosus: Secondary | ICD-10-CM | POA: Diagnosis not present

## 2018-11-19 DIAGNOSIS — M810 Age-related osteoporosis without current pathological fracture: Secondary | ICD-10-CM | POA: Diagnosis not present

## 2018-11-19 DIAGNOSIS — M1990 Unspecified osteoarthritis, unspecified site: Secondary | ICD-10-CM | POA: Diagnosis not present

## 2018-12-01 ENCOUNTER — Encounter: Payer: Self-pay | Admitting: Emergency Medicine

## 2018-12-02 ENCOUNTER — Other Ambulatory Visit: Payer: Self-pay | Admitting: Emergency Medicine

## 2018-12-02 DIAGNOSIS — L0100 Impetigo, unspecified: Secondary | ICD-10-CM

## 2018-12-02 MED ORDER — DOXYCYCLINE HYCLATE 100 MG PO TABS: 100.0000 mg | ORAL_TABLET | Freq: Two times a day (BID) | ORAL | 0 refills | Status: DC

## 2018-12-20 ENCOUNTER — Other Ambulatory Visit: Payer: Self-pay | Admitting: Emergency Medicine

## 2018-12-20 DIAGNOSIS — Z299 Encounter for prophylactic measures, unspecified: Secondary | ICD-10-CM

## 2018-12-26 ENCOUNTER — Other Ambulatory Visit: Payer: Self-pay

## 2018-12-26 DIAGNOSIS — F419 Anxiety disorder, unspecified: Secondary | ICD-10-CM

## 2018-12-26 MED ORDER — LORAZEPAM 1 MG PO TABS: 0.5000 mg | ORAL_TABLET | Freq: Every day | ORAL | 0 refills | Status: DC | PRN

## 2018-12-26 NOTE — Telephone Encounter (Signed)
Requested Prescriptions   Pending Prescriptions Disp Refills  . LORazepam (ATIVAN) 1 MG tablet 30 tablet 0    Sig: Take 0.5-1 tablets (0.5-1 mg total) by mouth daily as needed for anxiety. Use as sparingly as possible.    Last OV 12/02/2018  Last written 11/06/2018

## 2019-01-01 ENCOUNTER — Telehealth: Payer: Self-pay | Admitting: *Deleted

## 2019-01-01 NOTE — Telephone Encounter (Signed)
Schedule AWV.  

## 2019-02-02 ENCOUNTER — Other Ambulatory Visit: Payer: Self-pay | Admitting: Emergency Medicine

## 2019-02-02 DIAGNOSIS — Z299 Encounter for prophylactic measures, unspecified: Secondary | ICD-10-CM

## 2019-02-04 DIAGNOSIS — J069 Acute upper respiratory infection, unspecified: Secondary | ICD-10-CM | POA: Diagnosis not present

## 2019-02-04 DIAGNOSIS — M25439 Effusion, unspecified wrist: Secondary | ICD-10-CM | POA: Diagnosis not present

## 2019-02-04 DIAGNOSIS — R768 Other specified abnormal immunological findings in serum: Secondary | ICD-10-CM | POA: Diagnosis not present

## 2019-02-04 DIAGNOSIS — M1990 Unspecified osteoarthritis, unspecified site: Secondary | ICD-10-CM | POA: Diagnosis not present

## 2019-02-04 DIAGNOSIS — M069 Rheumatoid arthritis, unspecified: Secondary | ICD-10-CM | POA: Diagnosis not present

## 2019-02-04 DIAGNOSIS — M810 Age-related osteoporosis without current pathological fracture: Secondary | ICD-10-CM | POA: Diagnosis not present

## 2019-02-04 DIAGNOSIS — M549 Dorsalgia, unspecified: Secondary | ICD-10-CM | POA: Diagnosis not present

## 2019-02-04 DIAGNOSIS — Z79899 Other long term (current) drug therapy: Secondary | ICD-10-CM | POA: Diagnosis not present

## 2019-02-04 DIAGNOSIS — J029 Acute pharyngitis, unspecified: Secondary | ICD-10-CM | POA: Diagnosis not present

## 2019-02-24 ENCOUNTER — Telehealth: Payer: Self-pay | Admitting: *Deleted

## 2019-02-24 ENCOUNTER — Other Ambulatory Visit: Payer: Self-pay | Admitting: Emergency Medicine

## 2019-02-24 DIAGNOSIS — L0100 Impetigo, unspecified: Secondary | ICD-10-CM

## 2019-02-24 MED ORDER — DOXYCYCLINE HYCLATE 100 MG PO TABS: 100.0000 mg | ORAL_TABLET | Freq: Two times a day (BID) | ORAL | 0 refills | Status: DC

## 2019-02-24 NOTE — Telephone Encounter (Signed)
OK, thanks. Prescription sent.

## 2019-02-24 NOTE — Telephone Encounter (Signed)
He did he did two rounds of it.

## 2019-02-24 NOTE — Telephone Encounter (Signed)
Dr. Pamella Pert,  Trooper has been out of town taking care of his mother who is sick, he is only going to be back in town for two days tomorrow and Wednesday.    He was seen on 11-23-2018 for Impetigo did two doses of Doxycycline it cleared up but not all the way after second dose.  He states he has it back exactly the same as last visit.    Do you want to see him again or can another round of antibiotics be called in.     Thanks

## 2019-02-24 NOTE — Telephone Encounter (Signed)
Another round of doxycycline is reasonable but I hope he took the doxycycline for more than 2 doses.  He is supposed to take it twice a day for at least 7 days.  Thanks. Dr. Mitchel Honour

## 2019-02-24 NOTE — Telephone Encounter (Signed)
2nd attempt AWV

## 2019-02-25 ENCOUNTER — Encounter: Payer: Self-pay | Admitting: Emergency Medicine

## 2019-02-25 DIAGNOSIS — G43019 Migraine without aura, intractable, without status migrainosus: Secondary | ICD-10-CM | POA: Diagnosis not present

## 2019-02-25 DIAGNOSIS — G43719 Chronic migraine without aura, intractable, without status migrainosus: Secondary | ICD-10-CM | POA: Diagnosis not present

## 2019-02-25 NOTE — Telephone Encounter (Signed)
Pt called in to follow up on AWV, pt says that he was suppose to have had a call today but didn't.    Please assist

## 2019-02-25 NOTE — Telephone Encounter (Signed)
Pt would like to have Rx sent to his local [facility] on [address] instead.    Please assist.

## 2019-02-26 ENCOUNTER — Other Ambulatory Visit: Payer: Self-pay | Admitting: *Deleted

## 2019-02-26 DIAGNOSIS — L0100 Impetigo, unspecified: Secondary | ICD-10-CM

## 2019-02-26 MED ORDER — DOXYCYCLINE HYCLATE 100 MG PO TABS: 100.0000 mg | ORAL_TABLET | Freq: Two times a day (BID) | ORAL | 0 refills | Status: DC

## 2019-03-01 ENCOUNTER — Other Ambulatory Visit: Payer: Self-pay | Admitting: Emergency Medicine

## 2019-03-01 DIAGNOSIS — F419 Anxiety disorder, unspecified: Secondary | ICD-10-CM

## 2019-03-19 ENCOUNTER — Other Ambulatory Visit: Payer: Self-pay | Admitting: *Deleted

## 2019-03-19 DIAGNOSIS — Z299 Encounter for prophylactic measures, unspecified: Secondary | ICD-10-CM

## 2019-03-19 MED ORDER — ROSUVASTATIN CALCIUM 5 MG PO TABS: 5.0000 mg | ORAL_TABLET | Freq: Every day | ORAL | 3 refills | Status: DC

## 2019-04-21 ENCOUNTER — Other Ambulatory Visit: Payer: Self-pay

## 2019-04-21 ENCOUNTER — Encounter: Payer: Self-pay | Admitting: Emergency Medicine

## 2019-04-21 ENCOUNTER — Telehealth (INDEPENDENT_AMBULATORY_CARE_PROVIDER_SITE_OTHER): Payer: Medicare PPO | Admitting: Emergency Medicine

## 2019-04-21 VITALS — Ht 65.0 in | Wt 155.0 lb

## 2019-04-21 DIAGNOSIS — J029 Acute pharyngitis, unspecified: Secondary | ICD-10-CM

## 2019-04-21 DIAGNOSIS — F419 Anxiety disorder, unspecified: Secondary | ICD-10-CM

## 2019-04-21 DIAGNOSIS — R21 Rash and other nonspecific skin eruption: Secondary | ICD-10-CM

## 2019-04-21 MED ORDER — CIPROFLOXACIN HCL 500 MG PO TABS: 500.0000 mg | ORAL_TABLET | Freq: Two times a day (BID) | ORAL | 0 refills | Status: AC

## 2019-04-21 MED ORDER — LORAZEPAM 1 MG PO TABS: ORAL_TABLET | ORAL | 1 refills | Status: DC

## 2019-04-21 NOTE — Progress Notes (Signed)
Telemedicine Encounter- SOAP NOTE Established Patient  This telephone encounter was conducted with the patient's (or proxy's) verbal consent via audio telecommunications: yes/no: Yes Patient was instructed to have this encounter in a suitably private space; and to only have persons present to whom they give permission to participate. In addition, patient identity was confirmed by use of name plus two identifiers (DOB and address).  I discussed the limitations, risks, security and privacy concerns of performing an evaluation and management service by telephone and the availability of in person appointments. I also discussed with the patient that there may be a patient responsible charge related to this service. The patient expressed understanding and agreed to proceed.  I spent a total of TIME; 0 MIN TO 60 MIN: 15 minutes talking with the patient or their proxy.  No chief complaint on file. Sore throat and ear pain  Subjective    John Mcclain is a 63 y.o. male established patient. Telephone visit today complaining of sore throat mostly right-sided along with bilateral ear pain for the past 4 to 5 days.  No other associated symptoms. Also complaining of chronic impetiginous lesions to his scalp and neck on and off for the past several months and partially responsive to doxycycline that I have prescribed in the past.  Has taken 3 courses of doxycycline with only partial results. Also requesting refill on lorazepam. No other complaints or medical concerns today.  HPI   Patient Active Problem List   Diagnosis Date Noted  . Hypogonadism male 12/26/2011  . Vitamin D deficiency 12/26/2011  . Rheumatoid arthritis ([place]) 12/26/2011  . Migraine headache disorder 12/26/2011  . Anxiety disorder 12/26/2011  . HTN (hypertension), benign 12/26/2011    Past Medical History:  Diagnosis Date  . Anxiety   . Arthritis   . Chronic migraine   . Depression   . Diabetes mellitus without complication  ([place])   . Heart murmur   . Hypertension   . Kidney stones   . Raynaud's disease     Current Outpatient Medications  Medication Sig Dispense Refill  . alendronate (FOSAMAX) 70 MG tablet Take 70 mg by mouth once a week. Take with a full glass of water on an empty stomach.    Marland Kitchen amLODipine (NORVASC) 2.5 MG tablet Take 1 tablet (2.5 mg total) by mouth daily. 90 tablet 3  . aspirin EC 81 MG tablet Take 81 mg by mouth daily.    . Calcium Carbonate-Vitamin D (CALCIUM 600+D PO) Take by mouth daily.    . cyclobenzaprine (FLEXERIL) 10 MG tablet Take 10 mg by mouth 3 (three) times daily as needed.    . famotidine (PEPCID) 20 MG tablet Take 20 mg by mouth 2 (two) times daily.    . folic acid (FOLVITE) 1 MG tablet Take 1 mg by mouth daily.    . Galcanezumab-gnlm (EMGALITY) 120 MG/ML SOAJ Inject into the skin every 30 (thirty) days.    Marland Kitchen levETIRAcetam (KEPPRA) 750 MG tablet Take 750 mg by mouth 2 (two) times daily.     Marland Kitchen lisinopril (ZESTRIL) 10 MG tablet Take 1 tablet (10 mg total) by mouth daily. 90 tablet 3  . LORazepam (ATIVAN) 1 MG tablet TAKE 1/2 TO 1 TABLET EVERY DAY AS NEEDED FOR ANXIETY. USE AS SPARINGLY AS POSSIBLE 30 tablet 1  . magic mouthwash SOLN Take 5 mLs by mouth 4 (four) times daily. 120 mL 2  . Magnesium 400 MG TABS Take by mouth daily.    . methotrexate (  RHEUMATREX) 2.5 MG tablet Take 2.5 mg by mouth once a week. Taking five 21/2 mg tablets every Monday    . polyethylene glycol powder (GLYCOLAX/MIRALAX) powder Take 17 g by mouth daily. 3350 g prn  . predniSONE (DELTASONE) 5 MG tablet Take 5 mg by mouth as needed.    . rosuvastatin (CRESTOR) 5 MG tablet Take 1 tablet (5 mg total) by mouth daily. 90 tablet 3  . sildenafil (REVATIO) 20 MG tablet Take 3-5 tablets (60-100 mg total) by mouth 3 (three) times daily. 30 tablet 6  . Upadacitinib ER (RINVOQ) 15 MG TB24 Take by mouth daily.    . clobetasol ointment (TEMOVATE) [id number] % Apply 1 application topically 2 (two) times daily. Reported on  09/28/2015    . Erenumab-aooe (AIMOVIG) 70 MG/ML SOAJ Inject into the skin.    Marland Kitchen etanercept (ENBREL) 50 MG/ML injection Inject 50 mg into the skin once a week. Every Monday    . imipramine (TOFRANIL) 25 MG tablet Take 50 mg by mouth 2 (two) times daily.     Marland Kitchen OVER THE COUNTER MEDICATION Reported on 09/01/2015    . ranitidine (ZANTAC) 150 MG tablet Take 150 mg by mouth 2 (two) times daily.    . valACYclovir (VALTREX) 1000 MG tablet TAKE TWO TABLET BY MOUTH TWICE DAILY (Patient not taking: Reported on 04/21/2019) 4 tablet 0   No current facility-administered medications for this visit.     Allergies  Allergen Reactions  . Penicillins     Social History   Socioeconomic History  . Marital status: Married    Spouse name: Not on file  . Number of children: Not on file  . Years of education: Not on file  . Highest education level: Not on file  Occupational History  . Not on file  Social Needs  . Financial resource strain: Not on file  . Food insecurity    Worry: Not on file    Inability: Not on file  . Transportation needs    Medical: Not on file    Non-medical: Not on file  Tobacco Use  . Smoking status: Never Smoker  . Smokeless tobacco: Never Used  Substance and Sexual Activity  . Alcohol use: Yes    Comment: 2-3 per week  . Drug use: No  . Sexual activity: Yes    Partners: Female  Lifestyle  . Physical activity    Days per week: Not on file    Minutes per session: Not on file  . Stress: Not on file  Relationships  . Social Herbalist on phone: Not on file    Gets together: Not on file    Attends religious service: Not on file    Active member of club or organization: Not on file    Attends meetings of clubs or organizations: Not on file    Relationship status: Not on file  . Intimate partner violence    Fear of current or ex partner: Not on file    Emotionally abused: Not on file    Physically abused: Not on file    Forced sexual activity: Not on file   Other Topics Concern  . Not on file  Social History Narrative   Married. Education: [employer].     Review of Systems  Constitutional: Negative for chills and fever.  HENT: Positive for congestion, ear pain and sore throat.   Respiratory: Negative.  Negative for cough and shortness of breath.   Cardiovascular: Negative.  Negative for  chest pain and palpitations.  Gastrointestinal: Negative.  Negative for abdominal pain, diarrhea, nausea and vomiting.  Genitourinary: Negative.  Negative for dysuria and hematuria.  Musculoskeletal: Negative for back pain and joint pain.  Skin: Positive for rash (Scalp and neck).  Neurological: Negative for dizziness.  All other systems reviewed and are negative.   Objective  Alert and oriented x3 in no apparent respiratory distress. Vitals as reported by the patient: Today's Vitals   04/21/19 1702  Weight: 155 lb (70.3 kg)  Height: 5\' 5"  (1.651 m)    Diagnoses and all orders for this visit:  Sore throat -     ciprofloxacin (CIPRO) 500 MG tablet; Take 1 tablet (500 mg total) by mouth 2 (two) times daily for 7 days.  Rash and nonspecific skin eruption -     Ambulatory referral to Dermatology  Anxiety disorder, unspecified type -     LORazepam (ATIVAN) 1 MG tablet; TAKE 1/2 TO 1 TABLET EVERY DAY AS NEEDED FOR ANXIETY. USE AS SPARINGLY AS POSSIBLE  Clinically stable.  No red flag signs or symptoms.  No Covid symptoms. Advised to take medications as prescribed and follow-up in the office if no better or worse in the next 2 to 3 days.    I discussed the assessment and treatment plan with the patient. The patient was provided an opportunity to ask questions and all were answered. The patient agreed with the plan and demonstrated an understanding of the instructions.   The patient was advised to call back or seek an in-person evaluation if the symptoms worsen or if the condition fails to improve as anticipated.  I provided 15 minutes of  non-face-to-face time during this encounter.  Horald Pollen, MD  Primary Care at [facility]

## 2019-04-21 NOTE — Progress Notes (Signed)
Called patient to triage for appointment. Patient states antibiotic is not working for the impetigo, it does get better and returns after taking medication. Also, patient is complaining of sore throat on the right side of throat and right side earache for 4-5 days.

## 2019-05-09 ENCOUNTER — Other Ambulatory Visit: Payer: Self-pay | Admitting: Emergency Medicine

## 2019-05-09 NOTE — Telephone Encounter (Signed)
Forwarding medication refill request to the clinical pool for review. 

## 2019-05-14 ENCOUNTER — Ambulatory Visit: Payer: Medicare PPO | Admitting: Emergency Medicine

## 2019-05-16 NOTE — Telephone Encounter (Signed)
Please advise If you ok with refilling the abx

## 2019-05-18 NOTE — Telephone Encounter (Signed)
Okay with refill but he was also referred to dermatology.  Please look into this referral.  Thanks.

## 2019-05-27 DIAGNOSIS — G43719 Chronic migraine without aura, intractable, without status migrainosus: Secondary | ICD-10-CM | POA: Diagnosis not present

## 2019-05-27 DIAGNOSIS — G43019 Migraine without aura, intractable, without status migrainosus: Secondary | ICD-10-CM | POA: Diagnosis not present

## 2019-06-13 ENCOUNTER — Other Ambulatory Visit: Payer: Self-pay | Admitting: Emergency Medicine

## 2019-06-26 ENCOUNTER — Telehealth: Payer: Self-pay | Admitting: *Deleted

## 2019-06-26 NOTE — Telephone Encounter (Signed)
Decline AWV

## 2019-07-19 ENCOUNTER — Ambulatory Visit: Payer: Medicare PPO | Attending: Internal Medicine

## 2019-07-19 DIAGNOSIS — Z23 Encounter for immunization: Secondary | ICD-10-CM | POA: Insufficient documentation

## 2019-07-19 NOTE — Progress Notes (Signed)
   Covid-19 Vaccination Clinic  Name:  John Mcclain    MRN: [id number] DOB: September 05, 1955  07/19/2019  John Mcclain was observed post Covid-19 immunization for 15 minutes without incidence. He was provided with Vaccine Information Sheet and instruction to access the V-Safe system.   John Mcclain was instructed to call 911 with any severe reactions post vaccine: Marland Kitchen Difficulty breathing  . Swelling of your face and throat  . A fast heartbeat  . A bad rash all over your body  . Dizziness and weakness    Immunizations Administered    Name Date Dose VIS Date Route   Pfizer COVID-19 Vaccine 07/19/2019  3:07 PM 0.3 mL 05/16/2019 Intramuscular   Manufacturer: [place]   Lot: [id number]   [place]: [id number]

## 2019-07-31 ENCOUNTER — Telehealth: Payer: Self-pay | Admitting: *Deleted

## 2019-07-31 NOTE — Telephone Encounter (Signed)
Faxed therapy continuation request for Lorazepam 1 mg. Confirmation page at 5:57 pm and re-faxed adding patient's address.

## 2019-08-01 DIAGNOSIS — L02821 Furuncle of head [any part, except face]: Secondary | ICD-10-CM | POA: Diagnosis not present

## 2019-08-01 DIAGNOSIS — L0100 Impetigo, unspecified: Secondary | ICD-10-CM | POA: Diagnosis not present

## 2019-08-01 DIAGNOSIS — B0089 Other herpesviral infection: Secondary | ICD-10-CM | POA: Diagnosis not present

## 2019-08-01 DIAGNOSIS — B9689 Other specified bacterial agents as the cause of diseases classified elsewhere: Secondary | ICD-10-CM | POA: Diagnosis not present

## 2019-08-04 ENCOUNTER — Other Ambulatory Visit: Payer: Self-pay | Admitting: Emergency Medicine

## 2019-08-04 DIAGNOSIS — M810 Age-related osteoporosis without current pathological fracture: Secondary | ICD-10-CM | POA: Diagnosis not present

## 2019-08-04 DIAGNOSIS — F419 Anxiety disorder, unspecified: Secondary | ICD-10-CM

## 2019-08-04 DIAGNOSIS — Z79899 Other long term (current) drug therapy: Secondary | ICD-10-CM | POA: Diagnosis not present

## 2019-08-04 DIAGNOSIS — M069 Rheumatoid arthritis, unspecified: Secondary | ICD-10-CM | POA: Diagnosis not present

## 2019-08-04 DIAGNOSIS — B001 Herpesviral vesicular dermatitis: Secondary | ICD-10-CM | POA: Diagnosis not present

## 2019-08-04 DIAGNOSIS — M1990 Unspecified osteoarthritis, unspecified site: Secondary | ICD-10-CM | POA: Diagnosis not present

## 2019-08-04 DIAGNOSIS — M549 Dorsalgia, unspecified: Secondary | ICD-10-CM | POA: Diagnosis not present

## 2019-08-04 DIAGNOSIS — R768 Other specified abnormal immunological findings in serum: Secondary | ICD-10-CM | POA: Diagnosis not present

## 2019-08-04 MED ORDER — LORAZEPAM 1 MG PO TABS: ORAL_TABLET | ORAL | 1 refills | Status: DC

## 2019-08-04 NOTE — Telephone Encounter (Signed)
Patient is requesting a refill of the following medications: Requested Prescriptions   Pending Prescriptions Disp Refills  . LORazepam (ATIVAN) 1 MG tablet 30 tablet 1    Sig: TAKE 1/2 TO 1 TABLET EVERY DAY AS NEEDED FOR ANXIETY. USE AS SPARINGLY AS POSSIBLE    Date of patient request: 08/04/2019 Last office visit: 11/12/2019 Date of last refill: 04/21/19 Last refill amount: 30 tablets 1 refill Follow up time period per chart: no follow up scheduled / labs only

## 2019-08-04 NOTE — Telephone Encounter (Signed)
Requested medication (s) are due for refill today: yes  Requested medication (s) are on the active medication list: yes  Last refill:  04/21/2019  Future visit scheduled: no  Notes to clinic:  not delegated    Requested Prescriptions  Pending Prescriptions Disp Refills   LORazepam (ATIVAN) 1 MG tablet 30 tablet 1    Sig: TAKE 1/2 TO 1 TABLET EVERY DAY AS NEEDED FOR ANXIETY. USE AS SPARINGLY AS POSSIBLE      Not Delegated - Psychiatry:  Anxiolytics/Hypnotics Failed - 08/04/2019 12:44 PM      Failed - This refill cannot be delegated      Failed - Urine Drug Screen completed in last 360 days.      Passed - Valid encounter within last 6 months    Recent Outpatient Visits           3 months ago Sore throat   [facility] at [facility], Ines Bloomer, MD   8 months ago Essential hypertension   Primary Care at [place], [place], MD   1 year ago Rheumatoid arthritis involving multiple sites, unspecified rheumatoid factor presence [facility])   Primary Care at [facility], Ines Bloomer, MD   2 years ago Dyslipidemia   Primary Care at [place], PA-C   2 years ago PPD screening test   Primary Care at [facility], Laurey Arrow, MD

## 2019-08-04 NOTE — Telephone Encounter (Signed)
Copied from [place] [id number]. Topic: Quick Communication - Rx Refill/Question >> Aug 04, 2019 12:40 PM Mcneil, Ja-Kwan wrote: Medication: LORazepam (ATIVAN) 1 MG tablet  Has the patient contacted their pharmacy? yes   Preferred Pharmacy (with phone number or street name): [place], [place]  Phone: [phone]  Fax: [phone]  Agent: Please be advised that RX refills may take up to 3 business days. We ask that you follow-up with your pharmacy.

## 2019-08-06 DIAGNOSIS — G43719 Chronic migraine without aura, intractable, without status migrainosus: Secondary | ICD-10-CM | POA: Diagnosis not present

## 2019-08-06 DIAGNOSIS — G43019 Migraine without aura, intractable, without status migrainosus: Secondary | ICD-10-CM | POA: Diagnosis not present

## 2019-08-11 ENCOUNTER — Ambulatory Visit: Payer: Medicare PPO | Attending: Internal Medicine

## 2019-08-11 DIAGNOSIS — Z23 Encounter for immunization: Secondary | ICD-10-CM

## 2019-08-11 NOTE — Progress Notes (Signed)
   Covid-19 Vaccination Clinic  Name:  Devvon Arnwine    MRN: [id number] DOB: 12-06-1955  08/11/2019  Mr. Reali was observed post Covid-19 immunization for 15 minutes without incident. He was provided with Vaccine Information Sheet and instruction to access the V-Safe system.   Mr. Barraco was instructed to call 911 with any severe reactions post vaccine: Marland Kitchen Difficulty breathing  . Swelling of face and throat  . A fast heartbeat  . A bad rash all over body  . Dizziness and weakness   Immunizations Administered    Name Date Dose VIS Date Route   Pfizer COVID-19 Vaccine 08/11/2019  4:26 PM 0.3 mL 05/16/2019 Intramuscular   Manufacturer: [place]   Lot: [id number]   [place]: [id number]

## 2019-10-07 ENCOUNTER — Other Ambulatory Visit: Payer: Self-pay | Admitting: Emergency Medicine

## 2019-10-07 DIAGNOSIS — I10 Essential (primary) hypertension: Secondary | ICD-10-CM

## 2019-10-07 NOTE — Telephone Encounter (Signed)
Requested medications are due for refill today? Yes  Requested medications are on active medication list?  yes  Last Refill:   11/12/2018   # 90 with 3 refills   Future visit scheduled? No   Notes to Clinic:  Medication failed RX refill protocol due to no lab work within 180 days.  Last lab work performed on 11/12/2018.

## 2019-10-07 NOTE — Telephone Encounter (Signed)
Requested Prescriptions  Pending Prescriptions Disp Refills  . amLODipine (NORVASC) 2.5 MG tablet [Pharmacy Med Name: AMLODIPINE BESYLATE 2.5 MG Tablet] 90 tablet 0    Sig: TAKE 1 TABLET EVERY DAY     Cardiovascular:  Calcium Channel Blockers Passed - 10/07/2019  7:49 PM      Passed - Last BP in normal range    BP Readings from Last 1 Encounters:  11/12/18 130/80         Passed - Valid encounter within last 6 months    Recent Outpatient Visits          5 months ago Sore throat   Primary Care at [facility], Ines Bloomer, MD   10 months ago Essential hypertension   Primary Care at [place], [place], MD   1 year ago Rheumatoid arthritis involving multiple sites, unspecified rheumatoid factor presence [facility])   Primary Care at [facility], Ines Bloomer, MD   2 years ago Dyslipidemia   Primary Care at [place], PA-C   2 years ago PPD screening test   Primary Care at [facility], Laurey Arrow, MD             . lisinopril (ZESTRIL) 10 MG tablet [Pharmacy Med Name: LISINOPRIL 10 MG Tablet] 90 tablet 3    Sig: TAKE 1 TABLET EVERY DAY     Cardiovascular:  ACE Inhibitors Failed - 10/07/2019  7:49 PM      Failed - Cr in normal range and within 180 days    Creat  Date Value Ref Range Status  11/30/2015 1.07 0.70 - 1.25 mg/dL Final    Comment:      For patients > or = 67 years of age: The upper reference limit for Creatinine is approximately 13% higher for people identified as African-American.      Creatinine, Ser  Date Value Ref Range Status  11/12/2018 1.12 0.76 - 1.27 mg/dL Final         Failed - K in normal range and within 180 days    Potassium  Date Value Ref Range Status  11/12/2018 4.7 3.5 - 5.2 mmol/L Final         Passed - Patient is not pregnant      Passed - Last BP in normal range    BP Readings from Last 1 Encounters:  11/12/18 130/80         Passed - Valid encounter within last 6 months    Recent Outpatient Visits          5  months ago Sore throat   Primary Care at [place], Ines Bloomer, MD   10 months ago Essential hypertension   Primary Care at [place], [place], MD   1 year ago Rheumatoid arthritis involving multiple sites, unspecified rheumatoid factor presence [facility])   Primary Care at [facility], Ines Bloomer, MD   2 years ago Dyslipidemia   Primary Care at [place], Audrie Lia, PA-C   2 years ago PPD screening test   Primary Care at [facility], Laurey Arrow, MD

## 2019-10-08 ENCOUNTER — Other Ambulatory Visit: Payer: Self-pay

## 2019-10-08 ENCOUNTER — Telehealth: Payer: Self-pay | Admitting: *Deleted

## 2019-10-08 DIAGNOSIS — I10 Essential (primary) hypertension: Secondary | ICD-10-CM

## 2019-10-08 MED ORDER — LISINOPRIL 10 MG PO TABS: 10.0000 mg | ORAL_TABLET | Freq: Every day | ORAL | 0 refills | Status: DC

## 2019-10-09 NOTE — Telephone Encounter (Signed)
error 

## 2019-10-14 ENCOUNTER — Telehealth: Payer: Self-pay | Admitting: Emergency Medicine

## 2019-10-14 NOTE — Telephone Encounter (Signed)
What is the name of the medication? Lorazapam  Have you contacted your pharmacy to request a refill? yes  Which pharmacy would you like this sent to? [facility] mail delivery   Patient notified that their request is being sent to the clinical staff for review and that they should receive a call once it is complete. If they do not receive a call within 72 hours they can check with their pharmacy or our office.   Patient scheduled follow up appt 10/27/19

## 2019-10-14 NOTE — Telephone Encounter (Signed)
Patient is requesting a refill of the following medications: Requested Prescriptions    No prescriptions requested or ordered in this encounter  Ativan  Date of patient request: 10/14/2019 Last office visit: 11/12/2018 Date of last refill: 08/2019 Last refill amount: 30

## 2019-10-14 NOTE — Telephone Encounter (Signed)
Spoke with pt and scheduled 6 month f/u for med refills

## 2019-10-15 ENCOUNTER — Other Ambulatory Visit: Payer: Self-pay | Admitting: Emergency Medicine

## 2019-10-15 DIAGNOSIS — F419 Anxiety disorder, unspecified: Secondary | ICD-10-CM

## 2019-10-15 MED ORDER — LORAZEPAM 1 MG PO TABS: ORAL_TABLET | ORAL | 1 refills | Status: DC

## 2019-10-15 NOTE — Telephone Encounter (Signed)
Prescription sent. Thanks

## 2019-10-20 ENCOUNTER — Telehealth: Payer: Self-pay | Admitting: *Deleted

## 2019-10-20 NOTE — Telephone Encounter (Signed)
Faxed therapy continuation request for Lorazepam 1 with one refill mg to [facility]. Confirmation page 12:27 pm.

## 2019-10-27 ENCOUNTER — Telehealth: Payer: Self-pay | Admitting: Emergency Medicine

## 2019-10-27 ENCOUNTER — Encounter: Payer: Self-pay | Admitting: Emergency Medicine

## 2019-10-27 ENCOUNTER — Telehealth (INDEPENDENT_AMBULATORY_CARE_PROVIDER_SITE_OTHER): Payer: Medicare PPO | Admitting: Emergency Medicine

## 2019-10-27 DIAGNOSIS — N529 Male erectile dysfunction, unspecified: Secondary | ICD-10-CM

## 2019-10-27 DIAGNOSIS — S39012A Strain of muscle, fascia and tendon of lower back, initial encounter: Secondary | ICD-10-CM

## 2019-10-27 MED ORDER — TADALAFIL 5 MG PO TABS: 5.0000 mg | ORAL_TABLET | Freq: Every day | ORAL | 5 refills | Status: DC | PRN

## 2019-10-27 NOTE — Progress Notes (Addendum)
Telemedicine Encounter- SOAP NOTE Established Patient MyChart video conference This video telephone encounter was conducted with the patient's (or proxy's) verbal consent via video audio telecommunications: yes/no: Yes Patient was instructed to have this encounter in a suitably private space; and to only have persons present to whom they give permission to participate. In addition, patient identity was confirmed by use of name plus two identifiers (DOB and address).  I discussed the limitations, risks, security and privacy concerns of performing an evaluation and management service by telephone and the availability of in person appointments. I also discussed with the patient that there may be a patient responsible charge related to this service. The patient expressed understanding and agreed to proceed.  I spent a total of TIME; 0 MIN TO 60 MIN: 15 minutes talking with the patient or their proxy.  Chief Complaint  Patient presents with  . Medical Management of Chronic Issues    6 m f/u. med changes    Subjective   John Mcclain is a 64 y.o. male established patient. Telephone visit today complaining of erectile dysfunction.  Requesting Cialis 5 mg. Also pull his low back over the weekend complaining of pain mostly during ambulation.  Slowly getting better. No other complaints or medical concerns today.  HPI   Patient Active Problem List   Diagnosis Date Noted  . Hypogonadism male 12/26/2011  . Vitamin D deficiency 12/26/2011  . Rheumatoid arthritis ([place]) 12/26/2011  . Migraine headache disorder 12/26/2011  . Anxiety disorder 12/26/2011  . HTN (hypertension), benign 12/26/2011    Past Medical History:  Diagnosis Date  . Anxiety   . Arthritis   . Chronic migraine   . Depression   . Diabetes mellitus without complication ([place])   . Heart murmur   . Hypertension   . Kidney stones   . Raynaud's disease     Current Outpatient Medications  Medication Sig Dispense Refill   . alendronate (FOSAMAX) 70 MG tablet Take 70 mg by mouth once a week. Take with a full glass of water on an empty stomach.    Marland Kitchen amLODipine (NORVASC) 2.5 MG tablet TAKE 1 TABLET EVERY DAY 90 tablet 0  . aspirin EC 81 MG tablet Take 81 mg by mouth daily.    . Calcium Carbonate-Vitamin D (CALCIUM 600+D PO) Take by mouth daily.    . cyclobenzaprine (FLEXERIL) 10 MG tablet Take 10 mg by mouth 3 (three) times daily as needed.    . famotidine (PEPCID) 20 MG tablet Take 20 mg by mouth 2 (two) times daily.    . folic acid (FOLVITE) 1 MG tablet Take 1 mg by mouth daily.    Marland Kitchen levETIRAcetam (KEPPRA) 750 MG tablet Take 750 mg by mouth 2 (two) times daily.     Marland Kitchen lisinopril (ZESTRIL) 10 MG tablet TAKE 1 TABLET EVERY DAY 90 tablet 3  . LORazepam (ATIVAN) 1 MG tablet TAKE 1/2 TO 1 TABLET EVERY DAY AS NEEDED FOR ANXIETY. USE AS SPARINGLY AS POSSIBLE 30 tablet 1  . Magnesium 400 MG TABS Take by mouth daily.    . methotrexate (RHEUMATREX) 2.5 MG tablet Take 2.5 mg by mouth once a week. Taking five 21/2 mg tablets every Monday    . OVER THE COUNTER MEDICATION Reported on 09/01/2015    . polyethylene glycol powder (GLYCOLAX/MIRALAX) powder Take 17 g by mouth daily. 3350 g prn  . ranitidine (ZANTAC) 150 MG tablet Take 150 mg by mouth 2 (two) times daily.    . rosuvastatin (  CRESTOR) 5 MG tablet Take 1 tablet (5 mg total) by mouth daily. 90 tablet 3  . sildenafil (REVATIO) 20 MG tablet Take 3-5 tablets (60-100 mg total) by mouth 3 (three) times daily. 30 tablet 6  . Upadacitinib ER (RINVOQ) 15 MG TB24 Take by mouth daily.    . Galcanezumab-gnlm (EMGALITY) 120 MG/ML SOAJ Inject into the skin every 30 (thirty) days.    . magic mouthwash SOLN Take 5 mLs by mouth 4 (four) times daily. (Patient not taking: Reported on 10/27/2019) 120 mL 2  . predniSONE (DELTASONE) 5 MG tablet Take 5 mg by mouth as needed.    . valACYclovir (VALTREX) 1000 MG tablet TAKE TWO TABLET BY MOUTH TWICE DAILY (Patient not taking: Reported on  04/21/2019) 4 tablet 0   No current facility-administered medications for this visit.    Allergies  Allergen Reactions  . Penicillins     Social History   Socioeconomic History  . Marital status: Married    Spouse name: Not on file  . Number of children: Not on file  . Years of education: Not on file  . Highest education level: Not on file  Occupational History  . Not on file  Tobacco Use  . Smoking status: Never Smoker  . Smokeless tobacco: Never Used  Substance and Sexual Activity  . Alcohol use: Yes    Comment: 2-3 per week  . Drug use: No  . Sexual activity: Yes    Partners: Female  Other Topics Concern  . Not on file  Social History Narrative   Married. Education: [employer].    Social Determinants of Health   Financial Resource Strain:   . Difficulty of Paying Living Expenses:   Food Insecurity:   . Worried About Charity fundraiser in the Last Year:   . Arboriculturist in the Last Year:   Transportation Needs:   . Film/video editor (Medical):   Marland Kitchen Lack of Transportation (Non-Medical):   Physical Activity:   . Days of Exercise per Week:   . Minutes of Exercise per Session:   Stress:   . Feeling of Stress :   Social Connections:   . Frequency of Communication with Friends and Family:   . Frequency of Social Gatherings with Friends and Family:   . Attends Religious Services:   . Active Member of Clubs or Organizations:   . Attends Archivist Meetings:   Marland Kitchen Marital Status:   Intimate Partner Violence:   . Fear of Current or Ex-Partner:   . Emotionally Abused:   Marland Kitchen Physically Abused:   . Sexually Abused:     Review of Systems  Constitutional: Negative.  Negative for chills and fever.  HENT: Negative.  Negative for congestion and sore throat.   Respiratory: Negative.  Negative for cough and shortness of breath.   Cardiovascular: Negative.  Negative for chest pain and palpitations.  Gastrointestinal: Negative.  Negative for abdominal pain,  diarrhea, nausea and vomiting.  Genitourinary: Negative for dysuria, flank pain, frequency, hematuria and urgency.       Erectile dysfunction  Skin: Negative.  Negative for rash.  Neurological: Negative for dizziness and headaches.  All other systems reviewed and are negative.   Objective  Alert and oriented x3 in no apparent respiratory distress. Vitals as reported by the patient: There were no vitals filed for this visit.  There are no diagnoses linked to this encounter. Richey was seen today for medical management of chronic issues.  Diagnoses and  all orders for this visit:  Erectile dysfunction, unspecified erectile dysfunction type -     tadalafil (CIALIS) 5 MG tablet; Take 1 tablet (5 mg total) by mouth daily as needed for erectile dysfunction.  Strain of lumbar region, initial encounter     I discussed the assessment and treatment plan with the patient. The patient was provided an opportunity to ask questions and all were answered. The patient agreed with the plan and demonstrated an understanding of the instructions.   The patient was advised to call back or seek an in-person evaluation if the symptoms worsen or if the condition fails to improve as anticipated.  I provided 15 minutes of non-face-to-face time during this encounter.  Horald Pollen, MD  Primary Care at [facility]

## 2019-10-27 NOTE — Patient Instructions (Signed)
° ° ° °  If you have lab work done today you will be contacted with your lab results within the next 2 weeks.  If you have not heard from us then please contact us. The fastest way to get your results is to register for My Chart. ° ° °IF you received an x-ray today, you will receive an invoice from [facility]. Please contact [facility] at [phone] with questions or concerns regarding your invoice.  ° °IF you received labwork today, you will receive an invoice from [facility]. Please contact [facility] at [phone] with questions or concerns regarding your invoice.  ° °Our billing staff will not be able to assist you with questions regarding bills from these companies. ° °You will be contacted with the lab results as soon as they are available. The fastest way to get your results is to activate your My Chart account. Instructions are located on the last page of this paperwork. If you have not heard from us regarding the results in 2 weeks, please contact this office. °  ° ° ° °

## 2019-10-27 NOTE — Telephone Encounter (Signed)
Called patient.  No answer.

## 2019-11-04 DIAGNOSIS — M069 Rheumatoid arthritis, unspecified: Secondary | ICD-10-CM | POA: Diagnosis not present

## 2019-11-04 DIAGNOSIS — Z79899 Other long term (current) drug therapy: Secondary | ICD-10-CM | POA: Diagnosis not present

## 2019-11-10 ENCOUNTER — Other Ambulatory Visit: Payer: Self-pay

## 2019-11-10 ENCOUNTER — Encounter: Payer: Self-pay | Admitting: Emergency Medicine

## 2019-11-10 ENCOUNTER — Ambulatory Visit: Payer: Medicare PPO | Admitting: Emergency Medicine

## 2019-11-10 VITALS — BP 137/74 | HR 90 | Temp 98.7°F | Ht 65.0 in | Wt 150.0 lb

## 2019-11-10 DIAGNOSIS — Z1211 Encounter for screening for malignant neoplasm of colon: Secondary | ICD-10-CM | POA: Diagnosis not present

## 2019-11-10 DIAGNOSIS — M5440 Lumbago with sciatica, unspecified side: Secondary | ICD-10-CM

## 2019-11-10 DIAGNOSIS — M549 Dorsalgia, unspecified: Secondary | ICD-10-CM | POA: Diagnosis not present

## 2019-11-10 DIAGNOSIS — Z8739 Personal history of other diseases of the musculoskeletal system and connective tissue: Secondary | ICD-10-CM

## 2019-11-10 MED ORDER — HYDROCODONE-ACETAMINOPHEN 5-325 MG PO TABS: 1.0000 | ORAL_TABLET | Freq: Four times a day (QID) | ORAL | 0 refills | Status: DC | PRN

## 2019-11-10 NOTE — Patient Instructions (Addendum)
   If you have lab work done today you will be contacted with your lab results within the next 2 weeks.  If you have not heard from us then please contact us. The fastest way to get your results is to register for My Chart.   IF you received an x-ray today, you will receive an invoice from [facility]. Please contact [facility] at [phone] with questions or concerns regarding your invoice.   IF you received labwork today, you will receive an invoice from [facility]. Please contact [facility] at [phone] with questions or concerns regarding your invoice.   Our billing staff will not be able to assist you with questions regarding bills from these companies.  You will be contacted with the lab results as soon as they are available. The fastest way to get your results is to activate your My Chart account. Instructions are located on the last page of this paperwork. If you have not heard from us regarding the results in 2 weeks, please contact this office.     Acute Back Pain, Adult Acute back pain is sudden and usually short-lived. It is often caused by an injury to the muscles and tissues in the back. The injury may result from:  A muscle or ligament getting overstretched or torn (strained). Ligaments are tissues that connect bones to each other. Lifting something improperly can cause a back strain.  Wear and tear (degeneration) of the spinal disks. Spinal disks are circular tissue that provides cushioning between the bones of the spine (vertebrae).  Twisting motions, such as while playing sports or doing yard work.  A hit to the back.  Arthritis. You may have a physical exam, lab tests, and imaging tests to find the cause of your pain. Acute back pain usually goes away with rest and home care. Follow these instructions at home: Managing pain, stiffness, and swelling  Take over-the-counter and prescription medicines only as told by your health care  provider.  Your health care provider may recommend applying ice during the first 24-48 hours after your pain starts. To do this: ? Put ice in a plastic bag. ? Place a towel between your skin and the bag. ? Leave the ice on for 20 minutes, 2-3 times a day.  If directed, apply heat to the affected area as often as told by your health care provider. Use the heat source that your health care provider recommends, such as a moist heat pack or a heating pad. ? Place a towel between your skin and the heat source. ? Leave the heat on for 20-30 minutes. ? Remove the heat if your skin turns bright red. This is especially important if you are unable to feel pain, heat, or cold. You have a greater risk of getting burned. Activity   Do not stay in bed. Staying in bed for more than 1-2 days can delay your recovery.  Sit up and stand up straight. Avoid leaning forward when you sit, or hunching over when you stand. ? If you work at a desk, sit close to it so you do not need to lean over. Keep your chin tucked in. Keep your neck drawn back, and keep your elbows bent at a right angle. Your arms should look like the letter "L." ? Sit high and close to the steering wheel when you drive. Add lower back (lumbar) support to your car seat, if needed.  Take short walks on even surfaces as soon as you are able. Try   to increase the length of time you walk each day.  Do not sit, drive, or stand in one place for more than 30 minutes at a time. Sitting or standing for long periods of time can put stress on your back.  Do not drive or use heavy machinery while taking prescription pain medicine.  Use proper lifting techniques. When you bend and lift, use positions that put less stress on your back: ? Bend your knees. ? Keep the load close to your body. ? Avoid twisting.  Exercise regularly as told by your health care provider. Exercising helps your back heal faster and helps prevent back injuries by keeping muscles  strong and flexible.  Work with a physical therapist to make a safe exercise program, as recommended by your health care provider. Do any exercises as told by your physical therapist. Lifestyle  Maintain a healthy weight. Extra weight puts stress on your back and makes it difficult to have good posture.  Avoid activities or situations that make you feel anxious or stressed. Stress and anxiety increase muscle tension and can make back pain worse. Learn ways to manage anxiety and stress, such as through exercise. General instructions  Sleep on a firm mattress in a comfortable position. Try lying on your side with your knees slightly bent. If you lie on your back, put a pillow under your knees.  Follow your treatment plan as told by your health care provider. This may include: ? Cognitive or behavioral therapy. ? Acupuncture or massage therapy. ? Meditation or yoga. Contact a health care provider if:  You have pain that is not relieved with rest or medicine.  You have increasing pain going down into your legs or buttocks.  Your pain does not improve after 2 weeks.  You have pain at night.  You lose weight without trying.  You have a fever or chills. Get help right away if:  You develop new bowel or bladder control problems.  You have unusual weakness or numbness in your arms or legs.  You develop nausea or vomiting.  You develop abdominal pain.  You feel faint. Summary  Acute back pain is sudden and usually short-lived.  Use proper lifting techniques. When you bend and lift, use positions that put less stress on your back.  Take over-the-counter and prescription medicines and apply heat or ice as directed by your health care provider. This information is not intended to replace advice given to you by your health care provider. Make sure you discuss any questions you have with your health care provider. Document Revised: 09/10/2018 Document Reviewed: 01/03/2017 Elsevier  Patient Education  2020 Elsevier Inc.  

## 2019-11-10 NOTE — Progress Notes (Signed)
John Mcclain 64 y.o.   Chief Complaint  Patient presents with  . Back Pain    2.5 weeks     HISTORY OF PRESENT ILLNESS: This is a 64 y.o. male complaining of left lumbar pain for 2 to 3 weeks. BP Readings from Last 3 Encounters:  11/10/19 137/74  11/12/18 130/80  05/23/18 127/74    Back Pain This is a new problem. The current episode started 1 to 4 weeks ago. The problem occurs constantly. The problem has been waxing and waning since onset. The pain is present in the lumbar spine. The quality of the pain is described as aching. The pain radiates to the left knee. The pain is at a severity of 8/10. The pain is moderate. The pain is the same all the time. The symptoms are aggravated by bending, position and standing. Stiffness is present all day. Pertinent negatives include no abdominal pain, bladder incontinence, bowel incontinence, chest pain, dysuria, fever, headaches, leg pain, numbness, paresis, paresthesias, pelvic pain, perianal numbness, tingling, weakness or weight loss. He has tried NSAIDs and muscle relaxant for the symptoms. The treatment provided mild relief.     Prior to Admission medications   Medication Sig Start Date End Date Taking? Authorizing Provider  alendronate (FOSAMAX) 70 MG tablet Take 70 mg by mouth once a week. Take with a full glass of water on an empty stomach.   Yes [provider]  amLODipine (NORVASC) 2.5 MG tablet TAKE 1 TABLET EVERY DAY 10/07/19  Yes Rossie Bretado, Ines Bloomer, MD  aspirin EC 81 MG tablet Take 81 mg by mouth daily.   Yes [provider]  Calcium Carbonate-Vitamin D (CALCIUM 600+D PO) Take by mouth daily.   Yes [provider]  cyclobenzaprine (FLEXERIL) 10 MG tablet Take 10 mg by mouth 3 (three) times daily as needed.   Yes [provider]  famotidine (PEPCID) 20 MG tablet Take 20 mg by mouth 2 (two) times daily.   Yes [provider]  folic acid (FOLVITE) 1 MG tablet Take 1 mg by mouth daily.    Yes [provider]  levETIRAcetam (KEPPRA) 750 MG tablet Take 750 mg by mouth 2 (two) times daily.    Yes [provider]  lisinopril (ZESTRIL) 10 MG tablet TAKE 1 TABLET EVERY DAY 10/08/19  Yes Kadeem Hyle, Ines Bloomer, MD  LORazepam (ATIVAN) 1 MG tablet TAKE 1/2 TO 1 TABLET EVERY DAY AS NEEDED FOR ANXIETY. USE AS SPARINGLY AS POSSIBLE 10/15/19  Yes Yarden Hillis, Ines Bloomer, MD  Magnesium 400 MG TABS Take by mouth daily.   Yes [provider]  methotrexate (RHEUMATREX) 2.5 MG tablet Take 2.5 mg by mouth once a week. Taking five 21/2 mg tablets every Monday   Yes [provider]  polyethylene glycol powder (GLYCOLAX/MIRALAX) powder Take 17 g by mouth daily. 05/24/17  Yes Tereasa Coop, PA-C  rosuvastatin (CRESTOR) 5 MG tablet Take 1 tablet (5 mg total) by mouth daily. 03/19/19  Yes Nandi Tonnesen, Ines Bloomer, MD  tadalafil (CIALIS) 5 MG tablet Take 1 tablet (5 mg total) by mouth daily as needed for erectile dysfunction. 10/27/19 01/25/20 Yes Hubert Raatz, Ines Bloomer, MD  Upadacitinib ER [facility]) 15 MG TB24 Take by mouth daily.   Yes [provider]  magic mouthwash SOLN Take 5 mLs by mouth 4 (four) times daily. Patient not taking: Reported on 10/27/2019 09/28/15   Ivar Drape D, PA  predniSONE (DELTASONE) 5 MG tablet Take 5 mg by mouth as needed.    [provider]  valACYclovir (VALTREX) 1000 MG tablet TAKE TWO TABLET BY MOUTH TWICE DAILY Patient not taking: Reported on 04/21/2019 01/15/17   Ivar Drape D, PA    Allergies  Allergen Reactions  . Penicillins     Patient Active Problem List   Diagnosis Date Noted  . Hypogonadism male 12/26/2011  . Vitamin D deficiency 12/26/2011  . Rheumatoid arthritis ([place]) 12/26/2011  . Migraine headache disorder 12/26/2011  . Anxiety disorder 12/26/2011  . HTN (hypertension), benign 12/26/2011    Past Medical History:  Diagnosis Date  . Anxiety   . Arthritis   . Chronic migraine   . Depression    . Diabetes mellitus without complication ([place])   . Heart murmur   . Hypertension   . Kidney stones   . Raynaud's disease     Past Surgical History:  Procedure Laterality Date  . COLON SURGERY    . LYMPH NODE DISSECTION     removed lymph node  . VASECTOMY      Social History   Socioeconomic History  . Marital status: Married    Spouse name: Not on file  . Number of children: Not on file  . Years of education: Not on file  . Highest education level: Not on file  Occupational History  . Not on file  Tobacco Use  . Smoking status: Never Smoker  . Smokeless tobacco: Never Used  Substance and Sexual Activity  . Alcohol use: Yes    Comment: 2-3 per week  . Drug use: No  . Sexual activity: Yes    Partners: Female  Other Topics Concern  . Not on file  Social History Narrative   Married. Education: [employer].    Social Determinants of Health   Financial Resource Strain:   . Difficulty of Paying Living Expenses:   Food Insecurity:   . Worried About Charity fundraiser in the Last Year:   . Arboriculturist in the Last Year:   Transportation Needs:   . Film/video editor (Medical):   Marland Kitchen Lack of Transportation (Non-Medical):   Physical Activity:   . Days of Exercise per Week:   . Minutes of Exercise per Session:   Stress:   . Feeling of Stress :   Social Connections:   . Frequency of Communication with Friends and Family:   . Frequency of Social Gatherings with Friends and Family:   . Attends Religious Services:   . Active Member of Clubs or Organizations:   . Attends Archivist Meetings:   Marland Kitchen Marital Status:   Intimate Partner Violence:   . Fear of Current or Ex-Partner:   . Emotionally Abused:   Marland Kitchen Physically Abused:   . Sexually Abused:     Family History  Problem Relation Age of Onset  . Kidney disease Mother   . Hypertension Mother   . Arthritis Mother        RA  . COPD Mother   . Hyperlipidemia Mother   . Heart attack Father   . Diabetes  Father   . COPD Father   . Hypertension Father   . Migraines Brother   . Stroke Maternal Grandfather   . Alzheimer's disease Paternal Grandmother      Review of Systems  Constitutional: Negative for fever and weight loss.  HENT: Negative.  Negative for congestion and sore throat.   Respiratory: Negative for cough and shortness of breath.   Cardiovascular: Negative for chest pain.  Gastrointestinal: Negative.  Negative  for abdominal pain, bowel incontinence, melena, nausea and vomiting.  Genitourinary: Negative.  Negative for bladder incontinence, dysuria, flank pain, frequency, hematuria and pelvic pain.  Musculoskeletal: Positive for back pain.  Skin: Negative.  Negative for rash.  Neurological: Negative for dizziness, tingling, weakness, numbness, headaches and paresthesias.  All other systems reviewed and are negative.  Today's Vitals   11/10/19 1606  BP: 137/74  Pulse: 90  Temp: 98.7 F (37.1 C)  TempSrc: Temporal  SpO2: 97%  Weight: 150 lb (68 kg)  Height: 5\' 5"  (1.651 m)   Body mass index is 24.96 kg/m.   Physical Exam Vitals reviewed.  Constitutional:      Appearance: Normal appearance.  HENT:     Head: Normocephalic.  Eyes:     Extraocular Movements: Extraocular movements intact.     Pupils: Pupils are equal, round, and reactive to light.  Cardiovascular:     Rate and Rhythm: Normal rate and regular rhythm.     Pulses: Normal pulses.     Heart sounds: Normal heart sounds.  Pulmonary:     Effort: Pulmonary effort is normal.     Breath sounds: Normal breath sounds.  Abdominal:     General: Bowel sounds are normal. There is no distension.     Palpations: Abdomen is soft.     Tenderness: There is no abdominal tenderness.  Musculoskeletal:        General: Normal range of motion.     Cervical back: Normal range of motion.     Comments: Lower extremities: Warm to touch.  No erythema or bruising.  No skin lesions.  Axilla distal circulation with very good  cap refill.  Neurovascularly intact.  Skin:    General: Skin is warm and dry.     Capillary Refill: Capillary refill takes less than 2 seconds.  Neurological:     General: No focal deficit present.     Mental Status: He is alert and oriented to person, place, and time.     Sensory: No sensory deficit.     Motor: No weakness.     Coordination: Coordination normal.     Gait: Gait normal.     Deep Tendon Reflexes: Reflexes normal.  Psychiatric:        Mood and Affect: Mood normal.        Behavior: Behavior normal.      ASSESSMENT & PLAN: John Mcclain was seen today for back pain.  Diagnoses and all orders for this visit:  Musculoskeletal back pain -     HYDROcodone-acetaminophen (NORCO) 5-325 MG tablet; Take 1 tablet by mouth every 6 (six) hours as needed.  History of rheumatoid arthritis  Back pain of lumbar region with sciatica  Colon cancer screening -     Ambulatory referral to Gastroenterology    Patient Instructions       If you have lab work done today you will be contacted with your lab results within the next 2 weeks.  If you have not heard from [place] then please contact us. The fastest way to get your results is to register for My Chart.   IF you received an x-ray today, you will receive an invoice from [facility]. Please contact [facility] at [phone] with questions or concerns regarding your invoice.   IF you received labwork today, you will receive an invoice from [facility]. Please contact [facility] at [phone] with questions or concerns regarding your invoice.   Our billing staff will not be able to assist you with  questions regarding bills from these companies.  You will be contacted with the lab results as soon as they are available. The fastest way to get your results is to activate your My Chart account. Instructions are located on the last page of this paperwork. If you have not heard from [place] regarding the results in 2 weeks, please  contact this office.      Acute Back Pain, Adult Acute back pain is sudden and usually short-lived. It is often caused by an injury to the muscles and tissues in the back. The injury may result from:  A muscle or ligament getting overstretched or torn (strained). Ligaments are tissues that connect bones to each other. Lifting something improperly can cause a back strain.  Wear and tear (degeneration) of the spinal disks. Spinal disks are circular tissue that provides cushioning between the bones of the spine (vertebrae).  Twisting motions, such as while playing sports or doing yard work.  A hit to the back.  Arthritis. You may have a physical exam, lab tests, and imaging tests to find the cause of your pain. Acute back pain usually goes away with rest and home care. Follow these instructions at home: Managing pain, stiffness, and swelling  Take over-the-counter and prescription medicines only as told by your health care provider.  Your health care provider may recommend applying ice during the first 24-48 hours after your pain starts. To do this: ? Put ice in a plastic bag. ? Place a towel between your skin and the bag. ? Leave the ice on for 20 minutes, 2-3 times a day.  If directed, apply heat to the affected area as often as told by your health care provider. Use the heat source that your health care provider recommends, such as a moist heat pack or a heating pad. ? Place a towel between your skin and the heat source. ? Leave the heat on for 20-30 minutes. ? Remove the heat if your skin turns bright red. This is especially important if you are unable to feel pain, heat, or cold. You have a greater risk of getting burned. Activity   Do not stay in bed. Staying in bed for more than 1-2 days can delay your recovery.  Sit up and stand up straight. Avoid leaning forward when you sit, or hunching over when you stand. ? If you work at a desk, sit close to it so you do not need to  lean over. Keep your chin tucked in. Keep your neck drawn back, and keep your elbows bent at a right angle. Your arms should look like the letter "L." ? Sit high and close to the steering wheel when you drive. Add lower back (lumbar) support to your car seat, if needed.  Take short walks on even surfaces as soon as you are able. Try to increase the length of time you walk each day.  Do not sit, drive, or stand in one place for more than 30 minutes at a time. Sitting or standing for long periods of time can put stress on your back.  Do not drive or use heavy machinery while taking prescription pain medicine.  Use proper lifting techniques. When you bend and lift, use positions that put less stress on your back: ? [place] your knees. ? Keep the load close to your body. ? Avoid twisting.  Exercise regularly as told by your health care provider. Exercising helps your back heal faster and helps prevent back injuries by keeping muscles strong  and flexible.  Work with a physical therapist to make a safe exercise program, as recommended by your health care provider. Do any exercises as told by your physical therapist. Lifestyle  Maintain a healthy weight. Extra weight puts stress on your back and makes it difficult to have good posture.  Avoid activities or situations that make you feel anxious or stressed. Stress and anxiety increase muscle tension and can make back pain worse. Learn ways to manage anxiety and stress, such as through exercise. General instructions  Sleep on a firm mattress in a comfortable position. Try lying on your side with your knees slightly bent. If you lie on your back, put a pillow under your knees.  Follow your treatment plan as told by your health care provider. This may include: ? Cognitive or behavioral therapy. ? Acupuncture or massage therapy. ? Meditation or yoga. Contact a health care provider if:  You have pain that is not relieved with rest or medicine.  You  have increasing pain going down into your legs or buttocks.  Your pain does not improve after 2 weeks.  You have pain at night.  You lose weight without trying.  You have a fever or chills. Get help right away if:  You develop new bowel or bladder control problems.  You have unusual weakness or numbness in your arms or legs.  You develop nausea or vomiting.  You develop abdominal pain.  You feel faint. Summary  Acute back pain is sudden and usually short-lived.  Use proper lifting techniques. When you bend and lift, use positions that put less stress on your back.  Take over-the-counter and prescription medicines and apply heat or ice as directed by your health care provider. This information is not intended to replace advice given to you by your health care provider. Make sure you discuss any questions you have with your health care provider. Document Revised: 09/10/2018 Document Reviewed: 01/03/2017 Elsevier Patient Education  2020 Elsevier Inc.      Agustina Caroli, MD [facility]

## 2019-11-19 DIAGNOSIS — G43719 Chronic migraine without aura, intractable, without status migrainosus: Secondary | ICD-10-CM | POA: Diagnosis not present

## 2019-11-19 DIAGNOSIS — G43019 Migraine without aura, intractable, without status migrainosus: Secondary | ICD-10-CM | POA: Diagnosis not present

## 2019-11-21 ENCOUNTER — Telehealth: Payer: Medicare PPO | Admitting: Registered Nurse

## 2019-11-24 ENCOUNTER — Encounter: Payer: Self-pay | Admitting: Emergency Medicine

## 2019-11-24 ENCOUNTER — Telehealth (INDEPENDENT_AMBULATORY_CARE_PROVIDER_SITE_OTHER): Payer: Medicare PPO | Admitting: Emergency Medicine

## 2019-11-24 ENCOUNTER — Other Ambulatory Visit: Payer: Self-pay

## 2019-11-24 DIAGNOSIS — M549 Dorsalgia, unspecified: Secondary | ICD-10-CM

## 2019-11-24 DIAGNOSIS — M545 Low back pain: Secondary | ICD-10-CM

## 2019-11-24 DIAGNOSIS — M5136 Other intervertebral disc degeneration, lumbar region: Secondary | ICD-10-CM | POA: Diagnosis not present

## 2019-11-24 DIAGNOSIS — M5459 Other low back pain: Secondary | ICD-10-CM

## 2019-11-24 MED ORDER — HYDROCODONE-ACETAMINOPHEN 5-325 MG PO TABS: 1.0000 | ORAL_TABLET | Freq: Four times a day (QID) | ORAL | 0 refills | Status: DC | PRN

## 2019-11-24 MED ORDER — PREDNISONE 20 MG PO TABS: 20.0000 mg | ORAL_TABLET | Freq: Every day | ORAL | 0 refills | Status: AC

## 2019-11-24 NOTE — Progress Notes (Signed)
Telemedicine Encounter- SOAP NOTE Established Patient MyChart video conference This video telephone encounter was conducted with the patient's (or proxy's) verbal consent via video audio telecommunications: yes/no: Yes Patient was instructed to have this encounter in a suitably private space; and to only have persons present to whom they give permission to participate. In addition, patient identity was confirmed by use of name plus two identifiers (DOB and address).  I discussed the limitations, risks, security and privacy concerns of performing an evaluation and management service by telephone and the availability of in person appointments. I also discussed with the patient that there may be a patient responsible charge related to this service. The patient expressed understanding and agreed to proceed.  I spent a total of TIME; 0 MIN TO 60 MIN: 20 minutes talking with the patient or their proxy.  Chief Complaint  Patient presents with  . Back Pain    pt pinched nerve is still present pt notes it feels worse, pt is requesting to have a refill as the pain is now shooting down his legs     Subjective   John Mcclain is a 64 y.o. male established patient. Telephone visit today complaining of intractable lumbar pain for the past 3 weeks.  Seen by me 2 weeks ago and started on Norco.  Has been taking muscle relaxants and also increased doses of lorazepam with little relief.  Denies any new symptoms.  Sharp steady pain with some radiation to his legs.  No other associated symptoms.  HPI   Patient Active Problem List   Diagnosis Date Noted  . Hypogonadism male 12/26/2011  . Vitamin D deficiency 12/26/2011  . Rheumatoid arthritis ([place]) 12/26/2011  . Migraine headache disorder 12/26/2011  . Anxiety disorder 12/26/2011  . HTN (hypertension), benign 12/26/2011    Past Medical History:  Diagnosis Date  . Anxiety   . Arthritis   . Chronic migraine   . Depression   . Diabetes mellitus  without complication ([place])   . Heart murmur   . Hypertension   . Kidney stones   . Raynaud's disease     Current Outpatient Medications  Medication Sig Dispense Refill  . alendronate (FOSAMAX) 70 MG tablet Take 70 mg by mouth once a week. Take with a full glass of water on an empty stomach.    Marland Kitchen amLODipine (NORVASC) 2.5 MG tablet TAKE 1 TABLET EVERY DAY 90 tablet 0  . aspirin EC 81 MG tablet Take 81 mg by mouth daily.    . Calcium Carbonate-Vitamin D (CALCIUM 600+D PO) Take by mouth daily.    . cyclobenzaprine (FLEXERIL) 10 MG tablet Take 10 mg by mouth 3 (three) times daily as needed.    . famotidine (PEPCID) 20 MG tablet Take 20 mg by mouth 2 (two) times daily.    . folic acid (FOLVITE) 1 MG tablet Take 1 mg by mouth daily.    . Fremanezumab-vfrm (AJOVY) 225 MG/1.5ML SOAJ Inject 1.5 mLs into the skin every 30 (thirty) days.    Marland Kitchen HYDROcodone-acetaminophen (NORCO) 5-325 MG tablet Take 1 tablet by mouth every 6 (six) hours as needed. 12 tablet 0  . levETIRAcetam (KEPPRA) 750 MG tablet Take 750 mg by mouth 2 (two) times daily.     Marland Kitchen lisinopril (ZESTRIL) 10 MG tablet TAKE 1 TABLET EVERY DAY 90 tablet 3  . LORazepam (ATIVAN) 1 MG tablet TAKE 1/2 TO 1 TABLET EVERY DAY AS NEEDED FOR ANXIETY. USE AS SPARINGLY AS POSSIBLE 30 tablet 1  .  magic mouthwash SOLN Take 5 mLs by mouth 4 (four) times daily. 120 mL 2  . Magnesium 400 MG TABS Take by mouth daily.    . methotrexate (RHEUMATREX) 2.5 MG tablet Take 2.5 mg by mouth once a week. Taking five 21/2 mg tablets every Monday    . polyethylene glycol powder (GLYCOLAX/MIRALAX) powder Take 17 g by mouth daily. 3350 g prn  . predniSONE (DELTASONE) 5 MG tablet Take 5 mg by mouth as needed.    . rosuvastatin (CRESTOR) 5 MG tablet Take 1 tablet (5 mg total) by mouth daily. 90 tablet 3  . tadalafil (CIALIS) 5 MG tablet Take 1 tablet (5 mg total) by mouth daily as needed for erectile dysfunction. 90 tablet 5  . Upadacitinib ER (RINVOQ) 15 MG TB24 Take by mouth  daily.    . valACYclovir (VALTREX) 1000 MG tablet TAKE TWO TABLET BY MOUTH TWICE DAILY 4 tablet 0   No current facility-administered medications for this visit.    Allergies  Allergen Reactions  . Penicillins     Social History   Socioeconomic History  . Marital status: Married    Spouse name: Not on file  . Number of children: Not on file  . Years of education: Not on file  . Highest education level: Not on file  Occupational History  . Not on file  Tobacco Use  . Smoking status: Never Smoker  . Smokeless tobacco: Never Used  Substance and Sexual Activity  . Alcohol use: Yes    Comment: 2-3 per week  . Drug use: No  . Sexual activity: Yes    Partners: Female  Other Topics Concern  . Not on file  Social History Narrative   Married. Education: [employer].    Social Determinants of Health   Financial Resource Strain:   . Difficulty of Paying Living Expenses:   Food Insecurity:   . Worried About Charity fundraiser in the Last Year:   . Arboriculturist in the Last Year:   Transportation Needs:   . Film/video editor (Medical):   Marland Kitchen Lack of Transportation (Non-Medical):   Physical Activity:   . Days of Exercise per Week:   . Minutes of Exercise per Session:   Stress:   . Feeling of Stress :   Social Connections:   . Frequency of Communication with Friends and Family:   . Frequency of Social Gatherings with Friends and Family:   . Attends Religious Services:   . Active Member of Clubs or Organizations:   . Attends Archivist Meetings:   Marland Kitchen Marital Status:   Intimate Partner Violence:   . Fear of Current or Ex-Partner:   . Emotionally Abused:   Marland Kitchen Physically Abused:   . Sexually Abused:     Review of Systems  Constitutional: Negative.  Negative for chills and fever.  HENT: Negative.  Negative for congestion and sore throat.   Respiratory: Negative.  Negative for cough and shortness of breath.   Cardiovascular: Negative.  Negative for chest pain  and palpitations.  Gastrointestinal: Negative for abdominal pain, blood in stool, constipation, diarrhea, nausea and vomiting.  Genitourinary: Negative.  Negative for dysuria and hematuria.  Skin: Negative.  Negative for rash.  Neurological: Negative for dizziness, sensory change, focal weakness, weakness and headaches.  All other systems reviewed and are negative.   Objective  Alert and oriented x3 no apparent respiratory distress. Vitals as reported by the patient: Today's Vitals   11/24/19 1202  PainSc:  6     There are no diagnoses linked to this encounter. Aashir was seen today for back pain.  Diagnoses and all orders for this visit:  Musculoskeletal back pain -     predniSONE (DELTASONE) 20 MG tablet; Take 1 tablet (20 mg total) by mouth daily with breakfast for 7 days. -     HYDROcodone-acetaminophen (NORCO) 5-325 MG tablet; Take 1 tablet by mouth every 6 (six) hours as needed.  Intractable low back pain -     Ambulatory referral to Orthopedic Surgery  Degenerative disc disease, lumbar -     Ambulatory referral to Orthopedic Surgery     I discussed the assessment and treatment plan with the patient. The patient was provided an opportunity to ask questions and all were answered. The patient agreed with the plan and demonstrated an understanding of the instructions.   The patient was advised to call back or seek an in-person evaluation if the symptoms worsen or if the condition fails to improve as anticipated.  I provided 20 minutes of non-face-to-face time during this encounter.  Horald Pollen, MD  Primary Care at [facility]

## 2019-11-24 NOTE — Patient Instructions (Signed)
° ° ° °  If you have lab work done today you will be contacted with your lab results within the next 2 weeks.  If you have not heard from us then please contact us. The fastest way to get your results is to register for My Chart. ° ° °IF you received an x-ray today, you will receive an invoice from [facility]. Please contact [facility] at [phone] with questions or concerns regarding your invoice.  ° °IF you received labwork today, you will receive an invoice from [facility]. Please contact [facility] at [phone] with questions or concerns regarding your invoice.  ° °Our billing staff will not be able to assist you with questions regarding bills from these companies. ° °You will be contacted with the lab results as soon as they are available. The fastest way to get your results is to activate your My Chart account. Instructions are located on the last page of this paperwork. If you have not heard from us regarding the results in 2 weeks, please contact this office. °  ° ° ° °

## 2019-11-26 ENCOUNTER — Ambulatory Visit (INDEPENDENT_AMBULATORY_CARE_PROVIDER_SITE_OTHER): Payer: Medicare PPO

## 2019-11-26 ENCOUNTER — Ambulatory Visit: Payer: Medicare PPO | Admitting: Family Medicine

## 2019-11-26 ENCOUNTER — Other Ambulatory Visit: Payer: Self-pay

## 2019-11-26 ENCOUNTER — Encounter: Payer: Self-pay | Admitting: Family Medicine

## 2019-11-26 DIAGNOSIS — M5442 Lumbago with sciatica, left side: Secondary | ICD-10-CM

## 2019-11-26 DIAGNOSIS — M5441 Lumbago with sciatica, right side: Secondary | ICD-10-CM

## 2019-11-26 NOTE — Progress Notes (Signed)
Office Visit Note   Patient: John Mcclain           Date of Birth: March 24, 1956           MRN: [id number] Visit Date: 11/26/2019 Requested by: Horald Pollen, MD [place],  [place] [id number] PCP: Horald Pollen, MD  Subjective: Chief Complaint  Patient presents with  . Lower Back - Pain    Pain since doing yard work 10/25/19. Worsening. Mainly on the left side, but now starting to hurt on the right. Grabbing pain when gets out of car. Radiates down inner & outer thigh when stands a while. Heat helps.     HPI: He is here with low back pain.  Symptoms started about a month ago after doing yard work.  No pain immediately, but after he finished he had stiffness in his back.  This is typical for him after doing yard work, but usually it resolves after a couple days.  This time it is continued after about a month.  Mostly left-sided, occasional pain on the right.  Not a lot of radicular symptoms.  No bowel or bladder dysfunction.  Pain gets worse when getting in and out of the car, or when standing too long.              ROS: No fevers or chills.  All other systems were reviewed and are negative.  Objective: Vital Signs: There were no vitals taken for this visit.  Physical Exam:  General:  Alert and oriented, in no acute distress. Pulm:  Breathing unlabored. Psy:  Normal mood, congruent affect. Skin: No rash Low back: He has some tender areas in the left gluteus medius region but nothing that completely reproduces his pain.  No spine tenderness, no SI joint tenderness and no pain in the sciatic notch.  No pain with straight leg raise or with passive internal hip rotation.  Lower extremity strength and reflexes remain normal.  Imaging: XR Lumbar Spine 2-3 Views  Result Date: 11/26/2019 X-rays lumbar spine reveal lumbar scoliosis with moderate to severe L5-S1 degenerative disc disease, basically unchanged since 2017 x-rays.   Assessment & Plan: 1.  Persistent  low back pain with underlying L5-S1 degenerative disc disease, question foraminal stenosis. -We will refer him to physical therapy at [facility].  Trial of prednisone, with hydrocodone as needed. -Consider MRI scan plus or minus epidural injection if symptoms persist.     Procedures: No procedures performed  No notes on file     PMFS History: Patient Active Problem List   Diagnosis Date Noted  . Hypogonadism male 12/26/2011  . Vitamin D deficiency 12/26/2011  . Rheumatoid arthritis ([place]) 12/26/2011  . Migraine headache disorder 12/26/2011  . Anxiety disorder 12/26/2011  . HTN (hypertension), benign 12/26/2011   Past Medical History:  Diagnosis Date  . Anxiety   . Arthritis   . Chronic migraine   . Depression   . Diabetes mellitus without complication ([place])   . Heart murmur   . Hypertension   . Kidney stones   . Raynaud's disease     Family History  Problem Relation Age of Onset  . Kidney disease Mother   . Hypertension Mother   . Arthritis Mother        RA  . COPD Mother   . Hyperlipidemia Mother   . Heart attack Father   . Diabetes Father   . COPD Father   . Hypertension Father   . Migraines  Brother   . Stroke Maternal Grandfather   . Alzheimer's disease Paternal Grandmother     Past Surgical History:  Procedure Laterality Date  . COLON SURGERY    . LYMPH NODE DISSECTION     removed lymph node  . VASECTOMY     Social History   Occupational History  . Not on file  Tobacco Use  . Smoking status: Never Smoker  . Smokeless tobacco: Never Used  Substance and Sexual Activity  . Alcohol use: Yes    Comment: 2-3 per week  . Drug use: No  . Sexual activity: Yes    Partners: Female

## 2019-12-09 DIAGNOSIS — M545 Low back pain: Secondary | ICD-10-CM | POA: Diagnosis not present

## 2019-12-15 DIAGNOSIS — M545 Low back pain: Secondary | ICD-10-CM | POA: Diagnosis not present

## 2019-12-23 DIAGNOSIS — M545 Low back pain: Secondary | ICD-10-CM | POA: Diagnosis not present

## 2019-12-25 DIAGNOSIS — M545 Low back pain: Secondary | ICD-10-CM | POA: Diagnosis not present

## 2020-01-20 ENCOUNTER — Other Ambulatory Visit: Payer: Self-pay | Admitting: Emergency Medicine

## 2020-01-20 DIAGNOSIS — F419 Anxiety disorder, unspecified: Secondary | ICD-10-CM

## 2020-01-20 MED ORDER — LORAZEPAM 1 MG PO TABS: ORAL_TABLET | ORAL | 1 refills | Status: DC

## 2020-02-04 DIAGNOSIS — Z79899 Other long term (current) drug therapy: Secondary | ICD-10-CM | POA: Diagnosis not present

## 2020-02-04 DIAGNOSIS — B001 Herpesviral vesicular dermatitis: Secondary | ICD-10-CM | POA: Diagnosis not present

## 2020-02-04 DIAGNOSIS — M1990 Unspecified osteoarthritis, unspecified site: Secondary | ICD-10-CM | POA: Diagnosis not present

## 2020-02-04 DIAGNOSIS — R768 Other specified abnormal immunological findings in serum: Secondary | ICD-10-CM | POA: Diagnosis not present

## 2020-02-04 DIAGNOSIS — M549 Dorsalgia, unspecified: Secondary | ICD-10-CM | POA: Diagnosis not present

## 2020-02-04 DIAGNOSIS — M069 Rheumatoid arthritis, unspecified: Secondary | ICD-10-CM | POA: Diagnosis not present

## 2020-02-04 DIAGNOSIS — M25531 Pain in right wrist: Secondary | ICD-10-CM | POA: Diagnosis not present

## 2020-02-04 DIAGNOSIS — M810 Age-related osteoporosis without current pathological fracture: Secondary | ICD-10-CM | POA: Diagnosis not present

## 2020-02-18 ENCOUNTER — Other Ambulatory Visit: Payer: Self-pay | Admitting: Emergency Medicine

## 2020-02-18 DIAGNOSIS — I10 Essential (primary) hypertension: Secondary | ICD-10-CM

## 2020-02-18 DIAGNOSIS — Z299 Encounter for prophylactic measures, unspecified: Secondary | ICD-10-CM

## 2020-02-18 NOTE — Telephone Encounter (Signed)
Requested Prescriptions  Pending Prescriptions Disp Refills   amLODipine (NORVASC) 2.5 MG tablet [Pharmacy Med Name: AMLODIPINE BESYLATE 2.5 MG Tablet] 90 tablet 0    Sig: TAKE 1 TABLET EVERY DAY (NEED TO SCHEDULE A 6 MONTH FOLLOW UP APPOINTMENT)     Cardiovascular:  Calcium Channel Blockers Passed - 02/18/2020  8:05 PM      Passed - Last BP in normal range    BP Readings from Last 1 Encounters:  11/10/19 137/74         Passed - Valid encounter within last 6 months    Recent Outpatient Visits          2 months ago Musculoskeletal back pain   Primary Care at [facility], Ines Bloomer, MD   3 months ago Musculoskeletal back pain   Primary Care at [place], Ines Bloomer, MD   3 months ago Erectile dysfunction, unspecified erectile dysfunction type   Primary Care at [facility], Ines Bloomer, MD   10 months ago Sore throat   Primary Care at [place], Ines Bloomer, MD   1 year ago Essential hypertension   Primary Care at [place], Ines Bloomer, MD             patient was seen 10/27/19 video visit  Last fill 10/07/19 stated he needed  6 month appointment

## 2020-02-19 DIAGNOSIS — G43019 Migraine without aura, intractable, without status migrainosus: Secondary | ICD-10-CM | POA: Diagnosis not present

## 2020-02-19 DIAGNOSIS — G43719 Chronic migraine without aura, intractable, without status migrainosus: Secondary | ICD-10-CM | POA: Diagnosis not present

## 2020-03-09 ENCOUNTER — Ambulatory Visit: Payer: Medicare PPO | Admitting: Family Medicine

## 2020-03-09 ENCOUNTER — Encounter: Payer: Self-pay | Admitting: Family Medicine

## 2020-03-09 ENCOUNTER — Other Ambulatory Visit: Payer: Self-pay

## 2020-03-09 DIAGNOSIS — M5442 Lumbago with sciatica, left side: Secondary | ICD-10-CM | POA: Diagnosis not present

## 2020-03-09 DIAGNOSIS — M5441 Lumbago with sciatica, right side: Secondary | ICD-10-CM | POA: Diagnosis not present

## 2020-03-09 MED ORDER — HYDROCODONE-ACETAMINOPHEN 5-325 MG PO TABS: 1.0000 | ORAL_TABLET | Freq: Four times a day (QID) | ORAL | 0 refills | Status: DC | PRN

## 2020-03-09 NOTE — Progress Notes (Signed)
Office Visit Note   Patient: John Mcclain           Date of Birth: 11/28/1955           MRN: [id number] Visit Date: 03/09/2020 Requested by: Horald Pollen, MD [place],  [place] [id number] PCP: Horald Pollen, MD  Subjective: Chief Complaint  Patient presents with  . Lower Back - Pain, Follow-up    Went to PT for his back pain. Was helping, but the right side of the lower back is flared like the left. Still doing HEP. Pain has been bad again since last week. Pain going down the legs - knees hurt.    HPI: He is here with persistent back pain.  He went to physical therapy and has been doing home exercises.  At first his right-sided pain seemed to be improving, but then the pain shifted more to the left and now he is having radicular symptoms on a regular basis.  No bowel or bladder dysfunction.              ROS:   All other systems were reviewed and are negative.  Objective: Vital Signs: There were no vitals taken for this visit.  Physical Exam:  General:  Alert and oriented, in no acute distress. Pulm:  Breathing unlabored. Psy:  Normal mood, congruent affect. Skin: No rash Low back: There is slight tenderness in the left gluteus medius area.  No pain along the lumbar spinous processes.  Straight leg raise negative, lower extremity strength and reflexes remain normal.  He has quite a bit of pain with hip flexion against resistance on the left.  No pain with internal hip rotation.  Imaging: No results found.  Assessment & Plan: 1.  Persistent low back pain now with left-sided radiculopathy concerning for disc protrusion -We will proceed with MRI scan.  Intramuscular 40 mg methylprednisolone given today in the left gluteus medius region.  Could contemplate epidural injection depending on MRI findings.     Procedures: No procedures performed  No notes on file     PMFS History: Patient Active Problem List   Diagnosis Date Noted  . Hypogonadism male  12/26/2011  . Vitamin D deficiency 12/26/2011  . Rheumatoid arthritis ([place]) 12/26/2011  . Migraine headache disorder 12/26/2011  . Anxiety disorder 12/26/2011  . HTN (hypertension), benign 12/26/2011   Past Medical History:  Diagnosis Date  . Anxiety   . Arthritis   . Chronic migraine   . Depression   . Diabetes mellitus without complication ([place])   . Heart murmur   . Hypertension   . Kidney stones   . Raynaud's disease     Family History  Problem Relation Age of Onset  . Kidney disease Mother   . Hypertension Mother   . Arthritis Mother        RA  . COPD Mother   . Hyperlipidemia Mother   . Heart attack Father   . Diabetes Father   . COPD Father   . Hypertension Father   . Migraines Brother   . Stroke Maternal Grandfather   . Alzheimer's disease Paternal Grandmother     Past Surgical History:  Procedure Laterality Date  . COLON SURGERY    . LYMPH NODE DISSECTION     removed lymph node  . VASECTOMY     Social History   Occupational History  . Not on file  Tobacco Use  . Smoking status: Never Smoker  . Smokeless tobacco: Never  Used  Substance and Sexual Activity  . Alcohol use: Yes    Comment: 2-3 per week  . Drug use: No  . Sexual activity: Yes    Partners: Female

## 2020-03-26 ENCOUNTER — Other Ambulatory Visit: Payer: Self-pay

## 2020-03-26 ENCOUNTER — Ambulatory Visit
Admission: RE | Admit: 2020-03-26 | Discharge: 2020-03-26 | Disposition: A | Discharge status: Home / Self Care | Payer: Medicare PPO | Source: Ambulatory Visit | Attending: Family Medicine | Admitting: Family Medicine

## 2020-03-26 ENCOUNTER — Telehealth: Payer: Self-pay | Admitting: Family Medicine

## 2020-03-26 DIAGNOSIS — M5442 Lumbago with sciatica, left side: Secondary | ICD-10-CM

## 2020-03-26 DIAGNOSIS — M5441 Lumbago with sciatica, right side: Secondary | ICD-10-CM

## 2020-03-26 DIAGNOSIS — M48061 Spinal stenosis, lumbar region without neurogenic claudication: Secondary | ICD-10-CM | POA: Diagnosis not present

## 2020-03-26 NOTE — Telephone Encounter (Signed)
Lumbar MRI reveals moderate to severe narrowing of the spinal canal at L3-4, and moderate narrowing at L2-3 and L4-5 due to bone spurring and disc bulging.  There is severe narrowing of the nerve opening on the left at L2-3, on the right at L4-5 and on both sides at L3-4.  All of the above changes can cause pain in the back and the legs.  Could contemplate referral for epidural steroid injection.  If that does not help, then possibly surgical consult.

## 2020-03-29 NOTE — Addendum Note (Signed)
Addended by: Hortencia Pilar on: 03/29/2020 07:54 AM   Modules accepted: Orders

## 2020-03-31 ENCOUNTER — Other Ambulatory Visit: Payer: Medicare PPO

## 2020-05-03 ENCOUNTER — Ambulatory Visit: Payer: Self-pay

## 2020-05-03 ENCOUNTER — Encounter: Payer: Self-pay | Admitting: Physical Medicine and Rehabilitation

## 2020-05-03 ENCOUNTER — Other Ambulatory Visit: Payer: Self-pay

## 2020-05-03 ENCOUNTER — Ambulatory Visit: Payer: Medicare PPO | Admitting: Physical Medicine and Rehabilitation

## 2020-05-03 VITALS — BP 138/77 | HR 74

## 2020-05-03 DIAGNOSIS — M47816 Spondylosis without myelopathy or radiculopathy, lumbar region: Secondary | ICD-10-CM | POA: Insufficient documentation

## 2020-05-03 DIAGNOSIS — M48062 Spinal stenosis, lumbar region with neurogenic claudication: Secondary | ICD-10-CM

## 2020-05-03 DIAGNOSIS — M5416 Radiculopathy, lumbar region: Secondary | ICD-10-CM | POA: Insufficient documentation

## 2020-05-03 MED ORDER — BETAMETHASONE SOD PHOS & ACET 6 (3-3) MG/ML IJ SUSP
12.0000 mg | Freq: Once | INTRAMUSCULAR | Status: AC
  Administered 2020-05-03: 12 mg

## 2020-05-03 NOTE — Progress Notes (Deleted)
Pt state  Numeric Pain Rating Scale and Functional Assessment Average Pain {NUMBERS; 0-10:5044}   In the last MONTH (on 0-10 scale) has pain interfered with the following?  1. General activity like being  able to carry out your everyday physical activities such as walking, climbing stairs, carrying groceries, or moving a chair?  Rating({NUMBERS; 0-10:5044})   +Driver, -BT, -Dye Allergies.

## 2020-05-03 NOTE — Procedures (Signed)
Lumbar Epidural Steroid Injection - Interlaminar Approach with Fluoroscopic Guidance  Patient: John Mcclain      Date of Birth: [birth year] MRN: [id number] PCP: Horald Pollen, MD      Visit Date: 05/03/2020   Universal Protocol:     Consent Given By: the patient  Position: PRONE  Additional Comments: Vital signs were monitored before and after the procedure. Patient was prepped and draped in the usual sterile fashion. The correct patient, procedure, and site was verified.   Injection Procedure Details:   Procedure diagnoses: Lumbar radiculopathy [M54.16]   Meds Administered:  Meds ordered this encounter  Medications  . betamethasone acetate-betamethasone sodium phosphate (CELESTONE) injection 12 mg     Laterality: Left  Location/Site:  L4-L5  Transitional L5 segment ( Sacralized)  Needle: 3.5 in., 20 ga. Tuohy  Needle Placement: Paramedian epidural  Findings:   -Comments: Excellent flow of contrast into the epidural space.  Procedure Details: Using a paramedian approach from the side mentioned above, the region overlying the inferior lamina was localized under fluoroscopic visualization and the soft tissues overlying this structure were infiltrated with 4 ml. of 1% Lidocaine without Epinephrine. The Tuohy needle was inserted into the epidural space using a paramedian approach.   The epidural space was localized using loss of resistance along with counter oblique bi-planar fluoroscopic views.  After negative aspirate for air, blood, and CSF, a 2 ml. volume of Isovue-250 was injected into the epidural space and the flow of contrast was observed. Radiographs were obtained for documentation purposes.    The injectate was administered into the level noted above.   Additional Comments:  The patient tolerated the procedure well Dressing: 2 x 2 sterile gauze and Band-Aid    Post-procedure details: Patient was observed during the procedure. Post-procedure  instructions were reviewed.  Patient left the clinic in stable condition.

## 2020-05-03 NOTE — Progress Notes (Signed)
John Mcclain - 64 y.o. male MRN [id number]  Date of birth: [birth year]  Office Visit Note: Visit Date: 05/03/2020 PCP: Horald Pollen, MD Referred by: Horald Pollen, *  Subjective: Chief Complaint  Patient presents with  . Lower Back - Pain   HPI:  John Mcclain is a 64 y.o. male who comes in today at the request of Dr. Eunice Blase for planned Left L4-L5 Lumbar epidural steroid injection with fluoroscopic guidance.  The patient has failed conservative care including home exercise, medications, time and activity modification.  This injection will be diagnostic and hopefully therapeutic.  Please see requesting physician notes for further details and justification.  MRI reviewed with images and spine model.  MRI reviewed in the note below.  Patient's leg pain has improved somewhat with general cortisone type injection and time.  Consider diagnostic medial branch blocks with goal towards radiofrequency ablation should his back pain be more of an issue with the epidural not helping.  ROS Otherwise per HPI.  Assessment & Plan: Visit Diagnoses:    ICD-10-CM   1. Lumbar radiculopathy  M54.16 XR C-ARM NO REPORT    Epidural Steroid injection    betamethasone acetate-betamethasone sodium phosphate (CELESTONE) injection 12 mg  2. Spinal stenosis of lumbar region with neurogenic claudication  M48.062   3. Spondylosis without myelopathy or radiculopathy, lumbar region  M47.816     Plan: No additional findings.   Meds & Orders:  Meds ordered this encounter  Medications  . betamethasone acetate-betamethasone sodium phosphate (CELESTONE) injection 12 mg    Orders Placed This Encounter  Procedures  . XR C-ARM NO REPORT  . Epidural Steroid injection    Follow-up: Return if symptoms worsen or fail to improve.   Procedures: No procedures performed  Lumbar Epidural Steroid Injection - Interlaminar Approach with Fluoroscopic Guidance  Patient: John Mcclain      Date of  Birth: 06/08/1955 MRN: [id number] PCP: Horald Pollen, MD      Visit Date: 05/03/2020   Universal Protocol:     Consent Given By: the patient  Position: PRONE  Additional Comments: Vital signs were monitored before and after the procedure. Patient was prepped and draped in the usual sterile fashion. The correct patient, procedure, and site was verified.   Injection Procedure Details:   Procedure diagnoses: Lumbar radiculopathy [M54.16]   Meds Administered:  Meds ordered this encounter  Medications  . betamethasone acetate-betamethasone sodium phosphate (CELESTONE) injection 12 mg     Laterality: Left  Location/Site:  L4-L5  Transitional L5 segment ( Sacralized)  Needle: 3.5 in., 20 ga. Tuohy  Needle Placement: Paramedian epidural  Findings:   -Comments: Excellent flow of contrast into the epidural space.  Procedure Details: Using a paramedian approach from the side mentioned above, the region overlying the inferior lamina was localized under fluoroscopic visualization and the soft tissues overlying this structure were infiltrated with 4 ml. of 1% Lidocaine without Epinephrine. The Tuohy needle was inserted into the epidural space using a paramedian approach.   The epidural space was localized using loss of resistance along with counter oblique bi-planar fluoroscopic views.  After negative aspirate for air, blood, and CSF, a 2 ml. volume of Isovue-250 was injected into the epidural space and the flow of contrast was observed. Radiographs were obtained for documentation purposes.    The injectate was administered into the level noted above.   Additional Comments:  The patient tolerated the procedure well Dressing: 2 x 2 sterile gauze and Band-Aid  Post-procedure details: Patient was observed during the procedure. Post-procedure instructions were reviewed.  Patient left the clinic in stable condition.     Clinical History: MRI LUMBAR SPINE WITHOUT  CONTRAST  TECHNIQUE: Multiplanar, multisequence MR imaging of the lumbar spine was performed. No intravenous contrast was administered.  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 Careful correlation with this numbering strategy prior to any procedural intervention would be recommended.  Alignment: Dextroconvex scoliosis of the lumbar spine. Grade 1 anterolisthesis of L4 over L5, degenerative. Small retrolisthesis from T12 through L3.  Vertebrae: No acute fracture, evidence of discitis, or bone lesion. Mild chronic compression fracture of L4 with approximately 30% loss of vertebral body height. Endplate degenerative changes at T12-L1, L3-4 and L4-5.  Conus medullaris and cauda equina: Conus extends to the T12-L1 level. Conus and cauda equina appear normal.  Paraspinal and other soft tissues: Negative.  Disc levels:  T12-L1: Disc bulge with associated osteophytic component and facet degenerative changes resulting in narrowing of the bilateral subarticular zones, mild-to-moderate right and moderate left neural foraminal narrowing.  L1-2: Disc bulge, facet degenerative changes and ligamentum flavum redundancy resulting in mild spinal canal stenosis with effacement of the bilateral subarticular zones, mild right and moderate left neural foraminal narrowing.  L2-3: Left asymmetric disc bulge, facet degenerative changes and ligamentum flavum redundancy resulting in moderate spinal canal stenosis with narrowing of the bilateral subarticular zones, moderate right and severe left neural foraminal narrowing.  L3-4: Right asymmetric disc bulge with superimposed central disc extrusion migrating inferiorly, facet degenerative changes and ligamentum flavum redundancy resulting in moderate to severe spinal canal stenosis and severe bilateral neural foraminal.  L4-5:  Disc bulge, prominent hypertrophic facet degenerative change ligamentum flavum redundancy resulting in moderate spinal canal stenosis with narrowing of the right subarticular zone and severe right neural foraminal narrowing.  L5-S1: No spinal canal or neural foraminal stenosis.  IMPRESSION: 1. 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 Careful correlation with this numbering strategy prior to any procedural intervention would be recommended. 2. Multilevel degenerative changes of the lumbar spine with moderate to severe spinal canal stenosis at L3-4 and moderate spinal canal stenosis at L2-3 and L4-5. 3. Multilevel neural foraminal narrowing, severe on the left at L2-3, bilaterally at L3-4 and on the right at L4-5. 4. Mild chronic compression fracture of L4 with approximately 30% loss of vertebral body height.   Electronically Signed   By: Pedro Earls M.D.   On: 03/26/2020 14:46     Objective:  VS:  HT:    WT:   BMI:     BP:138/77  HR:74bpm  TEMP: ( )  RESP:  Physical Exam Constitutional:      General: He is not in acute distress.    Appearance: Normal appearance. He is not ill-appearing.  HENT:     Head: Normocephalic and atraumatic.     Right Ear: External ear normal.     Left Ear: External ear normal.  Eyes:     Extraocular Movements: Extraocular movements intact.  Cardiovascular:     Rate and Rhythm: Normal rate.     Pulses: Normal pulses.  Abdominal:     General: There is no distension.     Palpations: Abdomen is soft.  Musculoskeletal:        General: No tenderness or signs of injury.     Right lower leg: No  edema.     Left lower leg: No edema.     Comments: Patient has good distal strength without clonus. Patient somewhat slow to rise from a seated position to full extension.  There is concordant low back pain with facet loading and lumbar spine extension rotation.  There are no definitive trigger  points but the patient is somewhat tender across the lower back and PSIS.  There is no pain with hip rotation.   Skin:    Findings: No erythema or rash.  Neurological:     General: No focal deficit present.     Mental Status: He is alert and oriented to person, place, and time.     Sensory: No sensory deficit.     Motor: No weakness or abnormal muscle tone.     Coordination: Coordination normal.  Psychiatric:        Mood and Affect: Mood normal.        Behavior: Behavior normal.      Imaging: XR C-ARM NO REPORT  Result Date: 05/03/2020 Please see Notes tab for imaging impression.

## 2020-05-03 NOTE — Progress Notes (Signed)
Pain across low back with some pain radiating into legs- left worse than right. Leg pain has improved, but pain in left leg can radiate down to ankle. Some groin pain. Numeric Pain Rating Scale and Functional Assessment Average Pain 5   In the last MONTH (on 0-10 scale) has pain interfered with the following?  1. General activity like being  able to carry out your everyday physical activities such as walking, climbing stairs, carrying groceries, or moving a chair?  Rating(4)   +Driver, -BT, -Dye Allergies.

## 2020-05-06 ENCOUNTER — Telehealth: Payer: Self-pay | Admitting: *Deleted

## 2020-05-06 DIAGNOSIS — M810 Age-related osteoporosis without current pathological fracture: Secondary | ICD-10-CM | POA: Diagnosis not present

## 2020-05-06 DIAGNOSIS — M069 Rheumatoid arthritis, unspecified: Secondary | ICD-10-CM | POA: Diagnosis not present

## 2020-05-06 DIAGNOSIS — Z79899 Other long term (current) drug therapy: Secondary | ICD-10-CM | POA: Diagnosis not present

## 2020-05-06 DIAGNOSIS — M549 Dorsalgia, unspecified: Secondary | ICD-10-CM | POA: Diagnosis not present

## 2020-05-06 DIAGNOSIS — M1990 Unspecified osteoarthritis, unspecified site: Secondary | ICD-10-CM | POA: Diagnosis not present

## 2020-05-06 DIAGNOSIS — M8589 Other specified disorders of bone density and structure, multiple sites: Secondary | ICD-10-CM | POA: Diagnosis not present

## 2020-05-06 DIAGNOSIS — M25531 Pain in right wrist: Secondary | ICD-10-CM | POA: Diagnosis not present

## 2020-05-06 DIAGNOSIS — R768 Other specified abnormal immunological findings in serum: Secondary | ICD-10-CM | POA: Diagnosis not present

## 2020-05-06 DIAGNOSIS — B001 Herpesviral vesicular dermatitis: Secondary | ICD-10-CM | POA: Diagnosis not present

## 2020-05-06 NOTE — Telephone Encounter (Signed)
Faxed Rx request for Lorazepam 1 mg to [facility]. Confirmation page 5:13 pm.

## 2020-05-18 DIAGNOSIS — G43719 Chronic migraine without aura, intractable, without status migrainosus: Secondary | ICD-10-CM | POA: Diagnosis not present

## 2020-05-18 DIAGNOSIS — G43019 Migraine without aura, intractable, without status migrainosus: Secondary | ICD-10-CM | POA: Diagnosis not present

## 2020-05-19 ENCOUNTER — Other Ambulatory Visit: Payer: Self-pay | Admitting: Emergency Medicine

## 2020-05-19 DIAGNOSIS — I10 Essential (primary) hypertension: Secondary | ICD-10-CM

## 2020-06-03 ENCOUNTER — Telehealth: Payer: Self-pay | Admitting: *Deleted

## 2020-06-03 ENCOUNTER — Telehealth: Payer: Self-pay | Admitting: Emergency Medicine

## 2020-06-03 NOTE — Telephone Encounter (Signed)
Pt called back to sch AWV. Please advise. 

## 2020-06-03 NOTE — Telephone Encounter (Signed)
Schedule AWV.  

## 2020-07-07 ENCOUNTER — Telehealth: Payer: Self-pay | Admitting: *Deleted

## 2020-07-07 NOTE — Telephone Encounter (Signed)
Patient needs to schedule an appointment

## 2020-07-08 ENCOUNTER — Telehealth: Payer: Self-pay | Admitting: *Deleted

## 2020-07-08 NOTE — Telephone Encounter (Signed)
Spoke with patient and scheduled appt for next week.

## 2020-07-08 NOTE — Telephone Encounter (Signed)
Faxed Therapy Continuation request for Lorazepam 1 mg tablet to [facility]. Confirmation at 5:51 pm.

## 2020-07-13 ENCOUNTER — Encounter: Payer: Self-pay | Admitting: Emergency Medicine

## 2020-07-13 ENCOUNTER — Ambulatory Visit: Payer: Medicare PPO | Admitting: Emergency Medicine

## 2020-07-13 ENCOUNTER — Other Ambulatory Visit: Payer: Self-pay

## 2020-07-13 VITALS — BP 135/73 | HR 77 | Temp 98.3°F | Resp 16 | Ht 65.0 in | Wt 145.0 lb

## 2020-07-13 DIAGNOSIS — K644 Residual hemorrhoidal skin tags: Secondary | ICD-10-CM | POA: Diagnosis not present

## 2020-07-13 DIAGNOSIS — B351 Tinea unguium: Secondary | ICD-10-CM | POA: Diagnosis not present

## 2020-07-13 DIAGNOSIS — Z1211 Encounter for screening for malignant neoplasm of colon: Secondary | ICD-10-CM

## 2020-07-13 DIAGNOSIS — M069 Rheumatoid arthritis, unspecified: Secondary | ICD-10-CM

## 2020-07-13 DIAGNOSIS — R1319 Other dysphagia: Secondary | ICD-10-CM | POA: Diagnosis not present

## 2020-07-13 DIAGNOSIS — I10 Essential (primary) hypertension: Secondary | ICD-10-CM

## 2020-07-13 DIAGNOSIS — Z23 Encounter for immunization: Secondary | ICD-10-CM

## 2020-07-13 DIAGNOSIS — M48062 Spinal stenosis, lumbar region with neurogenic claudication: Secondary | ICD-10-CM

## 2020-07-13 MED ORDER — HYDROCORTISONE (PERIANAL) 2.5 % EX CREA: 1.0000 "application " | TOPICAL_CREAM | Freq: Two times a day (BID) | CUTANEOUS | 0 refills | Status: DC

## 2020-07-13 NOTE — Patient Instructions (Signed)
Health Maintenance After Age 67 After age 67, you are at a higher risk for certain long-term diseases and infections as well as injuries from falls. Falls are a major cause of broken bones and head injuries in people who are older than age 67. Getting regular preventive care can help to keep you healthy and well. Preventive care includes getting regular testing and making lifestyle changes as recommended by your health care provider. Talk with your health care provider about:  Which screenings and tests you should have. A screening is a test that checks for a disease when you have no symptoms.  A diet and exercise plan that is right for you. What should I know about screenings and tests to prevent falls? Screening and testing are the best ways to find a health problem early. Early diagnosis and treatment give you the best chance of managing medical conditions that are common after age 67. Certain conditions and lifestyle choices may make you more likely to have a fall. Your health care provider may recommend:  Regular vision checks. Poor vision and conditions such as cataracts can make you more likely to have a fall. If you wear glasses, make sure to get your prescription updated if your vision changes.  Medicine review. Work with your health care provider to regularly review all of the medicines you are taking, including over-the-counter medicines. Ask your health care provider about any side effects that may make you more likely to have a fall. Tell your health care provider if any medicines that you take make you feel dizzy or sleepy.  Osteoporosis screening. Osteoporosis is a condition that causes the bones to get weaker. This can make the bones weak and cause them to break more easily.  Blood pressure screening. Blood pressure changes and medicines to control blood pressure can make you feel dizzy.  Strength and balance checks. Your health care provider may recommend certain tests to check your  strength and balance while standing, walking, or changing positions.  Foot health exam. Foot pain and numbness, as well as not wearing proper footwear, can make you more likely to have a fall.  Depression screening. You may be more likely to have a fall if you have a fear of falling, feel emotionally low, or feel unable to do activities that you used to do.  Alcohol use screening. Using too much alcohol can affect your balance and may make you more likely to have a fall. What actions can I take to lower my risk of falls? General instructions  Talk with your health care provider about your risks for falling. Tell your health care provider if: ? You fall. Be sure to tell your health care provider about all falls, even ones that seem minor. ? You feel dizzy, sleepy, or off-balance.  Take over-the-counter and prescription medicines only as told by your health care provider. These include any supplements.  Eat a healthy diet and maintain a healthy weight. A healthy diet includes low-fat dairy products, low-fat (lean) meats, and fiber from whole grains, beans, and lots of fruits and vegetables. Home safety  Remove any tripping hazards, such as rugs, cords, and clutter.  Install safety equipment such as grab bars in bathrooms and safety rails on stairs.  Keep rooms and walkways well-lit. Activity  Follow a regular exercise program to stay fit. This will help you maintain your balance. Ask your health care provider what types of exercise are appropriate for you.  If you need a cane or walker,   use it as recommended by your health care provider.  Wear supportive shoes that have nonskid soles.   Lifestyle  Do not drink alcohol if your health care provider tells you not to drink.  If you drink alcohol, limit how much you have: ? 0-1 drink a day for women. ? 0-2 drinks a day for men.  Be aware of how much alcohol is in your drink. In the [place] one drink equals one typical bottle of beer (12  oz), one-half glass of wine (5 oz), or one shot of hard liquor (1 oz).  Do not use any products that contain nicotine or tobacco, such as cigarettes and e-cigarettes. If you need help quitting, ask your health care provider. Summary  Having a healthy lifestyle and getting preventive care can help to protect your health and wellness after age 67.  Screening and testing are the best way to find a health problem early and help you avoid having a fall. Early diagnosis and treatment give you the best chance for managing medical conditions that are more common for people who are older than age 67.  Falls are a major cause of broken bones and head injuries in people who are older than age 67. Take precautions to prevent a fall at home.  Work with your health care provider to learn what changes you can make to improve your health and wellness and to prevent falls. This information is not intended to replace advice given to you by your health care provider. Make sure you discuss any questions you have with your health care provider. Document Revised: 09/12/2018 Document Reviewed: 04/04/2017 [employer] Patient Education  2021 [employer]  

## 2020-07-13 NOTE — Progress Notes (Signed)
John Mcclain 65 y.o.   Chief Complaint  Patient presents with  . Hemorrhoids    Per patient bleeding just a tinge for 2-3 months, also having acid reflux for 3 months  . Nail Problem    Per patient nail fungus and been to the Dermatologist    HISTORY OF PRESENT ILLNESS: This is a 65 y.o. male complaining of several things: 1.  Painful external hemorrhoids with occasional bleed 2.  Fingernail fungus on the right hand 3.  History of GERD and now having some distal esophageal dysphagia symptoms 4.  History of rheumatoid arthritis and osteopenia.  Sees rheumatologist on a regular basis.  Recently taken off Rheumatrex.  Presently on Ajovy and Fosamax 5.  History of hypertension on amlodipine and lisinopril 6.  History of dyslipidemia on rosuvastatin 5 mg daily 7.  History of erectile dysfunction but Cialis 5 mg daily only partially working No other complaints or medical concerns today.  HPI    Prior to Admission medications   Medication Sig Start Date End Date Taking? Authorizing Provider  alendronate (FOSAMAX) 70 MG tablet Take 70 mg by mouth once a week. Take with a full glass of water on an empty stomach.   Yes [provider]  amLODipine (NORVASC) 2.5 MG tablet TAKE 1 TABLET EVERY DAY (NEED TO SCHEDULE A 6 MONTH FOLLOW UP APPOINTMENT) 05/19/20  Yes Horald Pollen, MD  aspirin EC 81 MG tablet Take 81 mg by mouth daily.   Yes [provider]  Calcium Carbonate-Vitamin D (CALCIUM 600+D PO) Take by mouth daily.   Yes [provider]  cyclobenzaprine (FLEXERIL) 10 MG tablet Take 10 mg by mouth 3 (three) times daily as needed.   Yes [provider]  doxycycline (ADOXA) 50 MG tablet Take 50 mg by mouth 2 (two) times daily.   Yes [provider]  famotidine (PEPCID) 20 MG tablet Take 20 mg by mouth 2 (two) times daily.   Yes [provider]  folic acid (FOLVITE) 1 MG tablet Take 1 mg by mouth daily.   Yes [provider]   Fremanezumab-vfrm (AJOVY) 225 MG/1.5ML SOAJ Inject 1.5 mLs into the skin every 30 (thirty) days.   Yes [provider]  levETIRAcetam (KEPPRA) 750 MG tablet Take 750 mg by mouth 2 (two) times daily.    Yes [provider]  lisinopril (ZESTRIL) 10 MG tablet TAKE 1 TABLET EVERY DAY 10/08/19  Yes Zaelyn Noack, Ines Bloomer, MD  LORazepam (ATIVAN) 1 MG tablet TAKE 1/2 TO 1 TABLET EVERY DAY AS NEEDED FOR ANXIETY. USE AS SPARINGLY AS POSSIBLE 01/20/20  Yes Marlissa Emerick, Ines Bloomer, MD  Magnesium 400 MG TABS Take by mouth daily.   Yes [provider]  polyethylene glycol powder (GLYCOLAX/MIRALAX) powder Take 17 g by mouth daily. 05/24/17  Yes Tereasa Coop, PA-C  rosuvastatin (CRESTOR) 5 MG tablet TAKE 1 TABLET EVERY DAY 02/18/20  Yes Rayleigh Gillyard, Ines Bloomer, MD  Upadacitinib ER 15 MG TB24 Take by mouth daily.   Yes [provider]  HYDROcodone-acetaminophen (NORCO) 5-325 MG tablet Take 1 tablet by mouth every 6 (six) hours as needed. 11/24/19   Horald Pollen, MD  methotrexate (RHEUMATREX) 2.5 MG tablet Take 2.5 mg by mouth once a week. Taking five 21/2 mg tablets every Monday    [provider]  tadalafil (CIALIS) 5 MG tablet Take 1 tablet (5 mg total) by mouth daily as needed for erectile dysfunction. 10/27/19 01/25/20  Horald Pollen, MD  Allergies  Allergen Reactions  . Penicillins     Patient Active Problem List   Diagnosis Date Noted  . Lumbar radiculopathy 05/03/2020  . 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  . Spondylosis without myelopathy or radiculopathy, lumbar region 05/03/2020  . Hypogonadism male 12/26/2011  . Vitamin D deficiency 12/26/2011  . Rheumatoid arthritis ([place]) 12/26/2011  . Migraine headache disorder 12/26/2011  . Anxiety disorder 12/26/2011  . HTN (hypertension), benign 12/26/2011    Past Medical History:  Diagnosis Date  . Anxiety   . Arthritis   . Chronic migraine   .  Depression   . Diabetes mellitus without complication ([place])   . Heart murmur   . Hypertension   . Kidney stones   . Raynaud's disease     Past Surgical History:  Procedure Laterality Date  . COLON SURGERY    . LYMPH NODE DISSECTION     removed lymph node  . VASECTOMY      Social History   Socioeconomic History  . Marital status: Married    Spouse name: Not on file  . Number of children: Not on file  . Years of education: Not on file  . Highest education level: Not on file  Occupational History  . Not on file  Tobacco Use  . Smoking status: Never Smoker  . Smokeless tobacco: Never Used  Substance and Sexual Activity  . Alcohol use: Yes    Comment: 2-3 per week  . Drug use: No  . Sexual activity: Yes    Partners: Female  Other Topics Concern  . Not on file  Social History Narrative   Married. Education: [employer].    Social Determinants of Health   Financial Resource Strain: Not on file  Food Insecurity: Not on file  Transportation Needs: Not on file  Physical Activity: Not on file  Stress: Not on file  Social Connections: Not on file  Intimate Partner Violence: Not on file    Family History  Problem Relation Age of Onset  . Kidney disease Mother   . Hypertension Mother   . Arthritis Mother        RA  . COPD Mother   . Hyperlipidemia Mother   . Heart attack Father   . Diabetes Father   . COPD Father   . Hypertension Father   . Migraines Brother   . Stroke Maternal Grandfather   . Alzheimer's disease Paternal Grandmother      Review of Systems  Constitutional: Negative.  Negative for chills and fever.  HENT: Negative.  Negative for congestion and sore throat.   Respiratory: Negative.  Negative for cough and shortness of breath.   Cardiovascular: Negative.  Negative for chest pain and palpitations.  Gastrointestinal: Negative.  Negative for abdominal pain, diarrhea, nausea and vomiting.  Genitourinary: Negative.  Negative for dysuria and hematuria.   Musculoskeletal: Negative.  Negative for back pain and myalgias.  Skin: Negative.  Negative for rash.  Neurological: Negative.  Negative for dizziness and headaches.  All other systems reviewed and are negative.  Today's Vitals   07/13/20 1730  BP: 135/73  Pulse: 77  Resp: 16  Temp: 98.3 F (36.8 C)  TempSrc: Temporal  SpO2: 97%  Weight: 145 lb (65.8 kg)  Height: 5\' 5"  (1.651 m)   Body mass index is 24.13 kg/m.   Physical Exam Vitals reviewed.  Constitutional:      Appearance: Normal appearance.  HENT:     Head: Normocephalic.  Eyes:  Extraocular Movements: Extraocular movements intact.     Conjunctiva/sclera: Conjunctivae normal.     Pupils: Pupils are equal, round, and reactive to light.  Cardiovascular:     Rate and Rhythm: Normal rate and regular rhythm.     Pulses: Normal pulses.     Heart sounds: Normal heart sounds.  Pulmonary:     Effort: Pulmonary effort is normal.     Breath sounds: Normal breath sounds.  Abdominal:     General: Bowel sounds are normal. There is no distension.     Palpations: Abdomen is soft.     Tenderness: There is no abdominal tenderness.  Genitourinary:    Rectum: External hemorrhoid present.  Musculoskeletal:        General: Normal range of motion.     Cervical back: Normal range of motion and neck supple.  Skin:    General: Skin is warm and dry.     Capillary Refill: Capillary refill takes less than 2 seconds.     Comments: Onychomycosis of 4 fingers on right hand  Neurological:     General: No focal deficit present.     Mental Status: He is alert and oriented to person, place, and time.  Psychiatric:        Mood and Affect: Mood normal.        Behavior: Behavior normal.      ASSESSMENT & PLAN: Franklin was seen today for hemorrhoids and nail problem.  Diagnoses and all orders for this visit:  External hemorrhoids -     hydrocortisone (ANUSOL-HC) 2.5 % rectal cream; Place 1 application rectally 2 (two) times  daily.  Screening for colon cancer -     Ambulatory referral to Gastroenterology  Need for vaccination with 13-polyvalent pneumococcal conjugate vaccine  Essential hypertension -     Comprehensive metabolic panel -     Lipid panel -     Hemoglobin A1c  Rheumatoid arthritis involving multiple sites, unspecified whether rheumatoid factor present ([place])  Spinal stenosis of lumbar region with neurogenic claudication  Onychomycosis of nail of digit of hand -     Ambulatory referral to Dermatology  Esophageal dysphagia -     Ambulatory referral to Gastroenterology  Other orders -     Pneumococcal polysaccharide vaccine 23-valent greater than or equal to 2yo subcutaneous/IM    Patient Instructions   Health Maintenance After Age 4 After age 66, you are at a higher risk for certain long-term diseases and infections as well as injuries from falls. Falls are a major cause of broken bones and head injuries in people who are older than age 38. Getting regular preventive care can help to keep you healthy and well. Preventive care includes getting regular testing and making lifestyle changes as recommended by your health care provider. Talk with your health care provider about:  Which screenings and tests you should have. A screening is a test that checks for a disease when you have no symptoms.  A diet and exercise plan that is right for you. What should I know about screenings and tests to prevent falls? Screening and testing are the best ways to find a health problem early. Early diagnosis and treatment give you the best chance of managing medical conditions that are common after age 68. Certain conditions and lifestyle choices may make you more likely to have a fall. Your health care provider may recommend:  Regular vision checks. Poor vision and conditions such as cataracts can make you more likely to have a  fall. If you wear glasses, make sure to get your prescription updated if your  vision changes.  Medicine review. Work with your health care provider to regularly review all of the medicines you are taking, including over-the-counter medicines. Ask your health care provider about any side effects that may make you more likely to have a fall. Tell your health care provider if any medicines that you take make you feel dizzy or sleepy.  Osteoporosis screening. Osteoporosis is a condition that causes the bones to get weaker. This can make the bones weak and cause them to break more easily.  Blood pressure screening. Blood pressure changes and medicines to control blood pressure can make you feel dizzy.  Strength and balance checks. Your health care provider may recommend certain tests to check your strength and balance while standing, walking, or changing positions.  Foot health exam. Foot pain and numbness, as well as not wearing proper footwear, can make you more likely to have a fall.  Depression screening. You may be more likely to have a fall if you have a fear of falling, feel emotionally low, or feel unable to do activities that you used to do.  Alcohol use screening. Using too much alcohol can affect your balance and may make you more likely to have a fall. What actions can I take to lower my risk of falls? General instructions  Talk with your health care provider about your risks for falling. Tell your health care provider if: ? You fall. Be sure to tell your health care provider about all falls, even ones that seem minor. ? You feel dizzy, sleepy, or off-balance.  Take over-the-counter and prescription medicines only as told by your health care provider. These include any supplements.  Eat a healthy diet and maintain a healthy weight. A healthy diet includes low-fat dairy products, low-fat (lean) meats, and fiber from whole grains, beans, and lots of fruits and vegetables. Home safety  Remove any tripping hazards, such as rugs, cords, and clutter.  Install  safety equipment such as grab bars in bathrooms and safety rails on stairs.  Keep rooms and walkways well-lit. Activity  Follow a regular exercise program to stay fit. This will help you maintain your balance. Ask your health care provider what types of exercise are appropriate for you.  If you need a cane or walker, use it as recommended by your health care provider.  Wear supportive shoes that have nonskid soles.   Lifestyle  Do not drink alcohol if your health care provider tells you not to drink.  If you drink alcohol, limit how much you have: ? 0-1 drink a day for women. ? 0-2 drinks a day for men.  Be aware of how much alcohol is in your drink. In the [place] one drink equals one typical bottle of beer (12 oz), one-half glass of wine (5 oz), or one shot of hard liquor (1 oz).  Do not use any products that contain nicotine or tobacco, such as cigarettes and e-cigarettes. If you need help quitting, ask your health care provider. Summary  Having a healthy lifestyle and getting preventive care can help to protect your health and wellness after age 41.  Screening and testing are the best way to find a health problem early and help you avoid having a fall. Early diagnosis and treatment give you the best chance for managing medical conditions that are more common for people who are older than age 65.  Falls are a major cause  of broken bones and head injuries in people who are older than age 60. Take precautions to prevent a fall at home.  Work with your health care provider to learn what changes you can make to improve your health and wellness and to prevent falls. This information is not intended to replace advice given to you by your health care provider. Make sure you discuss any questions you have with your health care provider. Document Revised: 09/12/2018 Document Reviewed: 04/04/2017 Elsevier Patient Education  2021 Elsevier Inc.      Agustina Caroli, MD [facility]

## 2020-07-14 LAB — COMPREHENSIVE METABOLIC PANEL
ALT: 13 IU/L (ref 0–44)
AST: 20 IU/L (ref 0–40)
Albumin/Globulin Ratio: 2.3 — ABNORMAL HIGH (ref 1.2–2.2)
Albumin: 4.4 g/dL (ref 3.8–4.8)
Alkaline Phosphatase: 29 IU/L — ABNORMAL LOW (ref 44–121)
BUN/Creatinine Ratio: 9 — ABNORMAL LOW (ref 10–24)
BUN: 10 mg/dL (ref 8–27)
Bilirubin Total: 0.5 mg/dL (ref 0.0–1.2)
CO2: 22 mmol/L (ref 20–29)
Calcium: 9.2 mg/dL (ref 8.6–10.2)
Chloride: 104 mmol/L (ref 96–106)
Creatinine, Ser: 1.1 mg/dL (ref 0.76–1.27)
GFR calc Af Amer: 80 mL/min/{1.73_m2} (ref >59)
GFR calc non Af Amer: 69 mL/min/{1.73_m2} (ref >59)
Globulin, Total: 1.9 g/dL (ref 1.5–4.5)
Glucose: 92 mg/dL (ref 65–99)
Potassium: 4 mmol/L (ref 3.5–5.2)
Sodium: 141 mmol/L (ref 134–144)
Total Protein: 6.3 g/dL (ref 6.0–8.5)

## 2020-07-14 LAB — HEMOGLOBIN A1C
Est. average glucose Bld gHb Est-mCnc: 120 mg/dL
Hgb A1c MFr Bld: 5.8 % — ABNORMAL HIGH (ref 4.8–5.6)

## 2020-07-14 LAB — LIPID PANEL
Chol/HDL Ratio: 1.8 ratio (ref 0.0–5.0)
Cholesterol, Total: 126 mg/dL (ref 100–199)
HDL: 69 mg/dL (ref >39)
LDL Chol Calc (NIH): 43 mg/dL (ref 0–99)
Triglycerides: 68 mg/dL (ref 0–149)
VLDL Cholesterol Cal: 14 mg/dL (ref 5–40)

## 2020-07-27 ENCOUNTER — Encounter: Payer: Self-pay | Admitting: Physician Assistant

## 2020-08-11 ENCOUNTER — Encounter: Payer: Self-pay | Admitting: Physician Assistant

## 2020-08-11 ENCOUNTER — Ambulatory Visit: Payer: Medicare PPO | Admitting: Physician Assistant

## 2020-08-11 VITALS — BP 140/70 | HR 67 | Ht 65.0 in | Wt 143.0 lb

## 2020-08-11 DIAGNOSIS — Z860100 Personal history of colon polyps, unspecified: Secondary | ICD-10-CM

## 2020-08-11 DIAGNOSIS — R1314 Dysphagia, pharyngoesophageal phase: Secondary | ICD-10-CM

## 2020-08-11 DIAGNOSIS — K219 Gastro-esophageal reflux disease without esophagitis: Secondary | ICD-10-CM | POA: Diagnosis not present

## 2020-08-11 MED ORDER — NA SULFATE-K SULFATE-MG SULF 17.5-3.13-1.6 GM/177ML PO SOLN: 1.0000 | Freq: Once | ORAL | 0 refills | Status: AC

## 2020-08-11 NOTE — Progress Notes (Signed)
Chief Complaint: GERD, discuss colonoscopy  HPI:    John Mcclain is a 65 year old male with a past medical history of diabetes and others listed below, who was referred to me by Horald Pollen, * for a complaint of GERD and to discuss colonoscopy.      09/21/2009 shows a report from a colonoscopy which was normal other than large hemorrhoids.  Repeat was recommended in 5 years at that time due to history of previous polyps.    07/13/2020 patient saw PCP and at that time discussed painful external hemorrhoids which occasionally bled as well as a history of reflux with some new dysphagia symptoms.  He was given hydrocortisone cream to be applied twice daily and sent to [place] for screening for colon cancer.    07/13/2020 CMP normal.    Today, the patient tells me that he has always had chronic reflux symptoms which he has controlled with an H2 blocker, initially Zantac which was changed to Famotidine 20 mg which he takes once daily and sometimes twice daily for breakthrough symptoms or prior to a meal that he feels like may bother him.  For the most part this controls things.  Over the past 6 months or so now he has had occasional episodes of dysphagia feeling like food gets stuck "right at the end" of his esophagus.  Describes a particular instance back in September/October when some rice got stuck at the bottom of his throat for an hour.  When this happens occasionally he will get up and move around and jump up and down and eventually will seem to go on down.  He has never had this issue before.    Also describes his history of polyps, apparently had a weird "grapelike cluster", that had to be removed with surgery back in the 90s and ever since and has been on a 5-year surveillance plan.    Denies fever, chills, abdominal pain or symptoms that awaken him from sleep.  Past Medical History:  Diagnosis Date  . Anxiety   . Arthritis   . Chronic migraine   . Depression   . Diabetes mellitus without  complication ([place])   . Heart murmur   . Hypertension   . Kidney stones   . Raynaud's disease     Past Surgical History:  Procedure Laterality Date  . COLON SURGERY    . LYMPH NODE DISSECTION     removed lymph node  . VASECTOMY      Current Outpatient Medications  Medication Sig Dispense Refill  . alendronate (FOSAMAX) 70 MG tablet Take 70 mg by mouth once a week. Take with a full glass of water on an empty stomach.    Marland Kitchen amLODipine (NORVASC) 2.5 MG tablet TAKE 1 TABLET EVERY DAY (NEED TO SCHEDULE A 6 MONTH FOLLOW UP APPOINTMENT) 90 tablet 0  . aspirin EC 81 MG tablet Take 81 mg by mouth daily.    . Calcium Carbonate-Vitamin D (CALCIUM 600+D PO) Take by mouth daily.    . cyclobenzaprine (FLEXERIL) 10 MG tablet Take 10 mg by mouth 3 (three) times daily as needed.    . famotidine (PEPCID) 20 MG tablet Take 20 mg by mouth 2 (two) times daily.    . folic acid (FOLVITE) 1 MG tablet Take 1 mg by mouth daily.    . Fremanezumab-vfrm (AJOVY) 225 MG/1.5ML SOAJ Inject 1.5 mLs into the skin every 30 (thirty) days.    . hydrocortisone (ANUSOL-HC) 2.5 % rectal cream Place 1 application rectally  2 (two) times daily. 30 g 0  . levETIRAcetam (KEPPRA) 750 MG tablet Take 750 mg by mouth 2 (two) times daily.     Marland Kitchen lisinopril (ZESTRIL) 10 MG tablet TAKE 1 TABLET EVERY DAY 90 tablet 3  . LORazepam (ATIVAN) 1 MG tablet TAKE 1/2 TO 1 TABLET EVERY DAY AS NEEDED FOR ANXIETY. USE AS SPARINGLY AS POSSIBLE 30 tablet 1  . Magnesium 400 MG TABS Take by mouth daily.    . polyethylene glycol powder (GLYCOLAX/MIRALAX) powder Take 17 g by mouth daily. 3350 g prn  . rosuvastatin (CRESTOR) 5 MG tablet TAKE 1 TABLET EVERY DAY 90 tablet 3  . Upadacitinib ER 15 MG TB24 Take by mouth daily.    . tadalafil (CIALIS) 5 MG tablet Take 1 tablet (5 mg total) by mouth daily as needed for erectile dysfunction. 90 tablet 5   No current facility-administered medications for this visit.    Allergies as of 08/11/2020 - Review  Complete 08/11/2020  Allergen Reaction Noted  . Penicillins  09/25/2011    Family History  Problem Relation Age of Onset  . Kidney disease Mother   . Hypertension Mother   . Arthritis Mother        RA  . COPD Mother   . Hyperlipidemia Mother   . Heart attack Father   . Diabetes Father   . COPD Father   . Hypertension Father   . Migraines Brother   . Stroke Maternal Grandfather   . Alzheimer's disease Paternal Grandmother     Social History   Socioeconomic History  . Marital status: Married    Spouse name: Not on file  . Number of children: Not on file  . Years of education: Not on file  . Highest education level: Not on file  Occupational History  . Not on file  Tobacco Use  . Smoking status: Never Smoker  . Smokeless tobacco: Never Used  Vaping Use  . Vaping Use: Never used  Substance and Sexual Activity  . Alcohol use: Yes    Comment: 2-3 per week  . Drug use: No  . Sexual activity: Yes    Partners: Female  Other Topics Concern  . Not on file  Social History Narrative   Married. Education: [employer].    Social Determinants of Health   Financial Resource Strain: Not on file  Food Insecurity: Not on file  Transportation Needs: Not on file  Physical Activity: Not on file  Stress: Not on file  Social Connections: Not on file  Intimate Partner Violence: Not on file    Review of Systems:    Constitutional: No weight loss, fever or chills Skin: No rash Cardiovascular: No chest pain   Respiratory: No SOB Gastrointestinal: See HPI and otherwise negative Genitourinary: No dysuria  Neurological: No headache, dizziness or syncope Musculoskeletal: No new muscle or joint pain Hematologic: No bleeding  Psychiatric: No history of depression or anxiety   Physical Exam:  Vital signs: BP 140/70   Pulse 67   Ht 5\' 5"  (1.651 m)   Wt 143 lb (64.9 kg)   BMI 23.80 kg/m   Constitutional:   Pleasant Caucasian male appears to be in NAD, Well developed, Well  nourished, alert and cooperative Head:  Normocephalic and atraumatic. Eyes:   PEERL, EOMI. No icterus. Conjunctiva pink. Ears:  Normal auditory acuity. Neck:  Supple Throat: Oral cavity and pharynx without inflammation, swelling or lesion.  Respiratory: Respirations even and unlabored. Lungs clear to auscultation bilaterally.   No  wheezes, crackles, or rhonchi.  Cardiovascular: Normal S1, S2. No MRG. Regular rate and rhythm. No peripheral edema, cyanosis or pallor.  Gastrointestinal:  Soft, nondistended, nontender. No rebound or guarding. Normal bowel sounds. No appreciable masses or hepatomegaly. Rectal:  Not performed.  Msk:  Symmetrical without gross deformities. Without edema, no deformity or joint abnormality.  Neurologic:  Alert and  oriented x4;  grossly normal neurologically.  Skin:   Dry and intact without significant lesions or rashes. Psychiatric: Demonstrates good judgement and reason without abnormal affect or behaviors.  RELEVANT LABS AND IMAGING: CBC    Component Value Date/Time   WBC 6.8 11/12/2018 1636   WBC 4.3 05/12/2015 1404   RBC 4.76 11/12/2018 1636   RBC 4.47 05/12/2015 1404   HGB 16.4 11/12/2018 1636   HCT 48.3 11/12/2018 1636   PLT 290 11/12/2018 1636   MCV 102 (H) 11/12/2018 1636   MCH 34.5 (H) 11/12/2018 1636   MCH 34.0 05/12/2015 1404   MCHC 34.0 11/12/2018 1636   MCHC 33.7 05/12/2015 1404   RDW 14.4 11/12/2018 1636   LYMPHSABS 2.2 11/12/2018 1636   MONOABS 0.9 05/12/2015 1404   EOSABS 0.0 11/12/2018 1636   BASOSABS 0.0 11/12/2018 1636    CMP     Component Value Date/Time   NA 141 07/13/2020 1613   K 4.0 07/13/2020 1613   CL 104 07/13/2020 1613   CO2 22 07/13/2020 1613   GLUCOSE 92 07/13/2020 1613   GLUCOSE 86 11/30/2015 1441   BUN 10 07/13/2020 1613   CREATININE 1.10 07/13/2020 1613   CREATININE 1.07 11/30/2015 1441   CALCIUM 9.2 07/13/2020 1613   PROT 6.3 07/13/2020 1613   ALBUMIN 4.4 07/13/2020 1613   AST 20 07/13/2020 1613   ALT 13  07/13/2020 1613   ALKPHOS 29 (L) 07/13/2020 1613   BILITOT 0.5 07/13/2020 1613   GFRNONAA 69 07/13/2020 1613   GFRNONAA 74 11/30/2015 1441   GFRAA 80 07/13/2020 1613   GFRAA 86 11/30/2015 1441    Assessment: 1.  GERD: Chronic for the patient mostly controlled on Famotidine 20 mg daily 2.  Dysphagia: New symptoms over the past 6 months or so of food getting stuck at the end of his esophagus; consider stricture versus other 3.  History of colonic polyps: Last colonoscopy in 2011 with Dr. Benson Norway, recommendations to repeat in 5 years given his polyp history  Plan: 1.  Scheduled patient for diagnostic EGD with dilation and a surveillance colonoscopy in the [place] with Dr. Bryan Lemma as he had availability.  Patient will follow with Dr. Bryan Lemma as his primary GI provider after time of procedures.  Did provide the patient with a detailed handout of risks for the procedures and he agrees to proceed. 2.  Continue Famotidine 20 mg for now.  Did discuss the pending findings of EGD may recommend a PPI. 3.  Reviewed anti- dysphagia measures. 4.  Patient to follow in clinic per recommendations from Dr. Bryan Lemma after time of procedures.  Ellouise Newer, PA-C [facility] 08/11/2020, 2:29 PM  Cc: Horald Pollen, *

## 2020-08-11 NOTE — Patient Instructions (Signed)
If you are age 67 or older, your body mass index should be between 23-30. Your Body mass index is 23.8 kg/m. If this is out of the aforementioned range listed, please consider follow up with your Primary Care Provider.  If you are age 75 or younger, your body mass index should be between 19-25. Your Body mass index is 23.8 kg/m. If this is out of the aformentioned range listed, please consider follow up with your Primary Care Provider.   You have been scheduled for an endoscopy and colonoscopy. Please follow the written instructions given to you at your visit today. Please pick up your prep supplies at the pharmacy within the next 1-3 days. If you use inhalers (even only as needed), please bring them with you on the day of your procedure.  Please Continue Famotidine.  Thank you for choosing me and [facility].  Ellouise Newer, PA-C

## 2020-08-13 NOTE — Progress Notes (Signed)
Agree with the assessment and plan as outlined by Jennifer Lemmon, PA-C. ? ?Madelyne Millikan, DO, FACG ? ?

## 2020-08-17 ENCOUNTER — Encounter: Payer: Self-pay | Admitting: Emergency Medicine

## 2020-08-20 DIAGNOSIS — M549 Dorsalgia, unspecified: Secondary | ICD-10-CM | POA: Diagnosis not present

## 2020-08-20 DIAGNOSIS — Z79899 Other long term (current) drug therapy: Secondary | ICD-10-CM | POA: Diagnosis not present

## 2020-08-20 DIAGNOSIS — M79671 Pain in right foot: Secondary | ICD-10-CM | POA: Diagnosis not present

## 2020-08-20 DIAGNOSIS — M1990 Unspecified osteoarthritis, unspecified site: Secondary | ICD-10-CM | POA: Diagnosis not present

## 2020-08-20 DIAGNOSIS — M25531 Pain in right wrist: Secondary | ICD-10-CM | POA: Diagnosis not present

## 2020-08-20 DIAGNOSIS — B001 Herpesviral vesicular dermatitis: Secondary | ICD-10-CM | POA: Diagnosis not present

## 2020-08-20 DIAGNOSIS — R768 Other specified abnormal immunological findings in serum: Secondary | ICD-10-CM | POA: Diagnosis not present

## 2020-08-20 DIAGNOSIS — M810 Age-related osteoporosis without current pathological fracture: Secondary | ICD-10-CM | POA: Diagnosis not present

## 2020-08-20 DIAGNOSIS — M069 Rheumatoid arthritis, unspecified: Secondary | ICD-10-CM | POA: Diagnosis not present

## 2020-08-24 ENCOUNTER — Other Ambulatory Visit: Payer: Self-pay | Admitting: Emergency Medicine

## 2020-08-24 DIAGNOSIS — I10 Essential (primary) hypertension: Secondary | ICD-10-CM

## 2020-08-31 ENCOUNTER — Other Ambulatory Visit: Payer: Self-pay | Admitting: *Deleted

## 2020-08-31 DIAGNOSIS — N529 Male erectile dysfunction, unspecified: Secondary | ICD-10-CM

## 2020-08-31 MED ORDER — TADALAFIL 5 MG PO TABS: 5.0000 mg | ORAL_TABLET | Freq: Every day | ORAL | 1 refills | Status: DC | PRN

## 2020-09-09 ENCOUNTER — Encounter: Payer: Self-pay | Admitting: Emergency Medicine

## 2020-09-09 ENCOUNTER — Other Ambulatory Visit: Payer: Self-pay | Admitting: Emergency Medicine

## 2020-09-09 DIAGNOSIS — I10 Essential (primary) hypertension: Secondary | ICD-10-CM

## 2020-09-13 ENCOUNTER — Ambulatory Visit (AMBULATORY_SURGERY_CENTER): Payer: Medicare PPO | Admitting: Gastroenterology

## 2020-09-13 ENCOUNTER — Other Ambulatory Visit: Payer: Self-pay

## 2020-09-13 ENCOUNTER — Encounter: Payer: Self-pay | Admitting: Gastroenterology

## 2020-09-13 VITALS — BP 116/62 | HR 75 | Temp 97.8°F | Resp 12 | Ht 65.0 in | Wt 143.0 lb

## 2020-09-13 DIAGNOSIS — R1319 Other dysphagia: Secondary | ICD-10-CM | POA: Diagnosis not present

## 2020-09-13 DIAGNOSIS — K5730 Diverticulosis of large intestine without perforation or abscess without bleeding: Secondary | ICD-10-CM

## 2020-09-13 DIAGNOSIS — K222 Esophageal obstruction: Secondary | ICD-10-CM

## 2020-09-13 DIAGNOSIS — D128 Benign neoplasm of rectum: Secondary | ICD-10-CM

## 2020-09-13 DIAGNOSIS — D125 Benign neoplasm of sigmoid colon: Secondary | ICD-10-CM

## 2020-09-13 DIAGNOSIS — K219 Gastro-esophageal reflux disease without esophagitis: Secondary | ICD-10-CM

## 2020-09-13 DIAGNOSIS — K2970 Gastritis, unspecified, without bleeding: Secondary | ICD-10-CM

## 2020-09-13 DIAGNOSIS — K621 Rectal polyp: Secondary | ICD-10-CM

## 2020-09-13 DIAGNOSIS — I10 Essential (primary) hypertension: Secondary | ICD-10-CM | POA: Diagnosis not present

## 2020-09-13 DIAGNOSIS — K259 Gastric ulcer, unspecified as acute or chronic, without hemorrhage or perforation: Secondary | ICD-10-CM | POA: Diagnosis not present

## 2020-09-13 DIAGNOSIS — K641 Second degree hemorrhoids: Secondary | ICD-10-CM

## 2020-09-13 DIAGNOSIS — Z86010 Personal history of colonic polyps: Secondary | ICD-10-CM

## 2020-09-13 DIAGNOSIS — E119 Type 2 diabetes mellitus without complications: Secondary | ICD-10-CM | POA: Diagnosis not present

## 2020-09-13 DIAGNOSIS — R1310 Dysphagia, unspecified: Secondary | ICD-10-CM | POA: Diagnosis not present

## 2020-09-13 MED ORDER — SODIUM CHLORIDE 0.9 % IV SOLN: 500.0000 mL | Freq: Once | INTRAVENOUS | Status: DC

## 2020-09-13 MED ORDER — SUCRALFATE 1 GM/10ML PO SUSP: 1.0000 g | Freq: Four times a day (QID) | ORAL | 1 refills | Status: DC

## 2020-09-13 MED ORDER — PANTOPRAZOLE SODIUM 40 MG PO TBEC: 40.0000 mg | DELAYED_RELEASE_TABLET | Freq: Two times a day (BID) | ORAL | 3 refills | Status: DC

## 2020-09-13 NOTE — Progress Notes (Signed)
Pt's states no medical or surgical changes since previsit or office visit. 

## 2020-09-13 NOTE — Patient Instructions (Signed)
Please read handouts provided. Continue present medications. Await pathology results. Use Protonix ( pantoprazole ) 40 mg twice daily for 8 weeks, then reduce to 40 mg daily. Use Sucralfate suspension 1 gram 4 times daily for 4 weeks. Return to GI office at appointment to be scheduled. Soft diet for 1 day. Consider a fiber supplement.      YOU HAD AN ENDOSCOPIC PROCEDURE TODAY AT [facility]:   Refer to the procedure report that was given to you for any specific questions about what was found during the examination.  If the procedure report does not answer your questions, please call your gastroenterologist to clarify.  If you requested that your care partner not be given the details of your procedure findings, then the procedure report has been included in a sealed envelope for you to review at your convenience later.  YOU SHOULD EXPECT: Some feelings of bloating in the abdomen. Passage of more gas than usual.  Walking can help get rid of the air that was put into your GI tract during the procedure and reduce the bloating. If you had a lower endoscopy (such as a colonoscopy or flexible sigmoidoscopy) you may notice spotting of blood in your stool or on the toilet paper. If you underwent a bowel prep for your procedure, you may not have a normal bowel movement for a few days.  Please Note:  You might notice some irritation and congestion in your nose or some drainage.  This is from the oxygen used during your procedure.  There is no need for concern and it should clear up in a day or so.  SYMPTOMS TO REPORT IMMEDIATELY:   Following lower endoscopy (colonoscopy or flexible sigmoidoscopy):  Excessive amounts of blood in the stool  Significant tenderness or worsening of abdominal pains  Swelling of the abdomen that is new, acute  Fever of 100F or higher   Following upper endoscopy (EGD)  Vomiting of blood or coffee ground material  New chest pain or pain under the  shoulder blades  Painful or persistently difficult swallowing  New shortness of breath  Fever of 100F or higher  Black, tarry-looking stools  For urgent or emergent issues, a gastroenterologist can be reached at any hour by calling [phone]. Do not use MyChart messaging for urgent concerns.    DIET:   Drink plenty of fluids but you should avoid alcoholic beverages for 24 hours.  ACTIVITY:  You should plan to take it easy for the rest of today and you should NOT DRIVE or use heavy machinery until tomorrow (because of the sedation medicines used during the test).    FOLLOW UP: Our staff will call the number listed on your records 48-72 hours following your procedure to check on you and address any questions or concerns that you may have regarding the information given to you following your procedure. If we do not reach you, we will leave a message.  We will attempt to reach you two times.  During this call, we will ask if you have developed any symptoms of COVID 19. If you develop any symptoms (ie: fever, flu-like symptoms, shortness of breath, cough etc.) before then, please call [phone].  If you test positive for Covid 19 in the 2 weeks post procedure, please call and report this information to [place].    If any biopsies were taken you will be contacted by phone or by letter within the next 1-3 weeks.  Please call us at [phone]  if you have not heard about the biopsies in 3 weeks.    SIGNATURES/CONFIDENTIALITY: You and/or your care partner have signed paperwork which will be entered into your electronic medical record.  These signatures attest to the fact that that the information above on your After Visit Summary has been reviewed and is understood.  Full responsibility of the confidentiality of this discharge information lies with you and/or your care-partner.

## 2020-09-13 NOTE — Progress Notes (Signed)
Report given to PACU, vss 

## 2020-09-13 NOTE — Progress Notes (Signed)
Called to room to assist during endoscopic procedure.  Patient ID and intended procedure confirmed with present staff. Received instructions for my participation in the procedure from the performing physician.  Scheduled for a repeat endo for 10/20/20 at 1000.  Instructions were printed off so pt doesn't need another pre-visit.  Sent                        R[facility]

## 2020-09-13 NOTE — Progress Notes (Signed)
1547 Ephedrine 10 mg given IV due to low BP, MD updated.

## 2020-09-13 NOTE — Progress Notes (Signed)
C.W. vital signs. 

## 2020-09-13 NOTE — Progress Notes (Signed)
1518 Robinul 0.1 mg IV given due large amount of secretions upon assessment.  MD made aware, vss

## 2020-09-13 NOTE — Op Note (Signed)
[place] Patient Name: John Mcclain Procedure Date: 09/13/2020 3:18 PM MRN: [id number] Endoscopist: Gerrit Heck , MD Age: 65 Referring MD:  Date of Birth: 1955-08-01 Gender: Male Account #: [id number] Procedure:                Colonoscopy Indications:              Surveillance: Personal history of colonic polyps                            (unknown histology) on last colonoscopy more than 5                            years ago                           History of rectal polyp resected surgically                            (TAMUS?) in the 1990's.                           Last colonoscopy was 09/2009 and no polyps at that                            time. Medicines:                Monitored Anesthesia Care Procedure:                Pre-Anesthesia Assessment:                           - Prior to the procedure, a History and Physical                            was performed, and patient medications and                            allergies were reviewed. The patient's tolerance of                            previous anesthesia was also reviewed. The risks                            and benefits of the procedure and the sedation                            options and risks were discussed with the patient.                            All questions were answered, and informed consent                            was obtained. Prior Anticoagulants: The patient has                            taken no  previous anticoagulant or antiplatelet                            agents. ASA Grade Assessment: II - A patient with                            mild systemic disease. After reviewing the risks                            and benefits, the patient was deemed in                            satisfactory condition to undergo the procedure.                           After obtaining informed consent, the colonoscope                            was passed under direct vision. Throughout the                             procedure, the patient's blood pressure, pulse, and                            oxygen saturations were monitored continuously. The                            Olympus CF-HQ190L [id number]) Colonoscope was                            introduced through the anus and advanced to the the                            terminal ileum. The colonoscopy was performed                            without difficulty. The patient tolerated the                            procedure well. The quality of the bowel                            preparation was good. The terminal ileum, ileocecal                            valve, appendiceal orifice, and rectum were                            photographed. Scope In: 3:37:44 PM Scope Out: 3:57:49 PM Scope Withdrawal Time: 0 hours 14 minutes 15 seconds  Total Procedure Duration: 0 hours 20 minutes 5 seconds  Findings:                 The perianal and digital rectal examinations were  normal.                           A 5 mm polyp was found in the sigmoid colon. The                            polyp was sessile. The polyp was removed with a                            cold snare. Resection and retrieval were complete.                            Estimated blood loss was minimal.                           Two sessile polyps were found in the rectum. The                            polyps were 2 to 3 mm in size and located near the                            scar site. Based on small size and location, these                            polyps were removed with a cold biopsy forceps.                            Resection and retrieval were complete. Estimated                            blood loss was minimal.                           A post polypectomy scar was found in the rectum.                            The scar itself was otherwise healthy appearing. 2                            small polyps located in close proximity,  resected                            as above.                           A few small-mouthed diverticula were found in the                            sigmoid colon.                           Non-bleeding internal hemorrhoids were found during  retroflexion. The hemorrhoids were small.                           The terminal ileum appeared normal. Complications:            No immediate complications. Estimated Blood Loss:     Estimated blood loss was minimal. Impression:               - One 5 mm polyp in the sigmoid colon, removed with                            a cold snare. Resected and retrieved.                           - Two 2 to 3 mm polyps in the rectum, removed with                            a cold biopsy forceps. Resected and retrieved.                           - Post-polypectomy scar in the rectum.                           - Diverticulosis in the sigmoid colon.                           - Non-bleeding internal hemorrhoids.                           - The examined portion of the ileum was normal. Recommendation:           - Patient has a contact number available for                            emergencies. The signs and symptoms of potential                            delayed complications were discussed with the                            patient. Return to normal activities tomorrow.                            Written discharge instructions were provided to the                            patient.                           - Resume previous diet.                           - Continue present medications.                           - Await pathology results.                           -  Repeat colonoscopy for surveillance based on                            pathology results.                           - Use fiber, for example Citrucel, Fibercon, Konsyl                            or Metamucil. Gerrit Heck, MD 09/13/2020 4:13:25 PM

## 2020-09-13 NOTE — Progress Notes (Signed)
Prep instructions entered and reviewed with patient for repeat EGD on 10/26/20

## 2020-09-13 NOTE — Op Note (Signed)
[place] Patient Name: John Mcclain Procedure Date: 09/13/2020 3:18 PM MRN: [id number] Endoscopist: Gerrit Heck , MD Age: 65 Referring MD:  Date of Birth: [birth year] Gender: Male Account #: [id number] Procedure:                Upper GI endoscopy Indications:              Dysphagia, Suspected esophageal reflux Medicines:                Monitored Anesthesia Care Procedure:                Pre-Anesthesia Assessment:                           - Prior to the procedure, a History and Physical                            was performed, and patient medications and                            allergies were reviewed. The patient's tolerance of                            previous anesthesia was also reviewed. The risks                            and benefits of the procedure and the sedation                            options and risks were discussed with the patient.                            All questions were answered, and informed consent                            was obtained. Prior Anticoagulants: The patient has                            taken no previous anticoagulant or antiplatelet                            agents. ASA Grade Assessment: II - A patient with                            mild systemic disease. After reviewing the risks                            and benefits, the patient was deemed in                            satisfactory condition to undergo the procedure.                           After obtaining informed consent, the endoscope was  passed under direct vision. Throughout the                            procedure, the patient's blood pressure, pulse, and                            oxygen saturations were monitored continuously. The                            Endoscope was introduced through the mouth, and                            advanced to the second part of duodenum. The upper                            GI endoscopy was  accomplished without difficulty.                            The patient tolerated the procedure well. Scope In: Scope Out: Findings:                 Mucosal changes including subtle longitudinal                            furrows were found in the upper third of the                            esophagus and in the middle third of the esophagus.                            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 The scope was                            withdrawn. Dilation was performed with a Maloney                            dilator with mild resistance at 68 Fr. The dilation                            site was examined following endoscope reinsertion                            and showed mild mucosal disruption x2 at 16 cm from                            the incisors and at the GEJ, consistent with                            successful dilation. Biopsies were then obtained  from the proximal and distal esophagus with cold                            forceps for histology of suspected eosinophilic                            esophagitis. Estimated blood loss was minimal.                           The Z-line was regular and was found 39 cm from the                            incisors.                           One non-bleeding cratered gastric ulcer with                            moderate surrounding mucosal erythema was found in                            the gastric body. The lesion was 10 mm in largest                            dimension. Biopsies were taken with a cold forceps                            for histology. Estimated blood loss was minimal.                           Localized moderate inflammation characterized by                            congestion (edema), erythema, erosions, and shallow                            ulcerations was found in the gastric antrum.                             Biopsies were taken with a cold forceps for                            histology. Estimated blood loss was minimal.                           The examined duodenum was normal. Complications:            No immediate complications. Estimated Blood Loss:     Estimated blood loss was minimal. Impression:               - Esophageal mucosal changes. Dilated. Biopsied.                           - Z-line regular, 39 cm from the incisors.                           -  Non-bleeding gastric ulcer. Biopsied.                           - Gastritis. Biopsied.                           - Normal examined duodenum. Recommendation:           - Patient has a contact number available for                            emergencies. The signs and symptoms of potential                            delayed complications were discussed with the                            patient. Return to normal activities tomorrow.                            Written discharge instructions were provided to the                            patient.                           - Soft diet for 1 day.                           - Continue present medications.                           - Await pathology results.                           - Use Protonix (pantoprazole) 40 mg PO BID for 8                            weeks, then reduce to 40 mg daily.                           - Use sucralfate suspension 1 gram PO QID for 4                            weeks.                           - Repeat upper endoscopy in 6 weeks to check                            healing with repeat biopsies as needed based on                            today's results.                           - Return to GI office  at appointment to be                            scheduled. Gerrit Heck, MD 09/13/2020 4:08:42 PM

## 2020-09-15 ENCOUNTER — Telehealth: Payer: Self-pay

## 2020-09-15 ENCOUNTER — Other Ambulatory Visit: Payer: Self-pay | Admitting: Emergency Medicine

## 2020-09-15 ENCOUNTER — Telehealth: Payer: Self-pay | Admitting: Physical Medicine and Rehabilitation

## 2020-09-15 ENCOUNTER — Other Ambulatory Visit: Payer: Self-pay

## 2020-09-15 DIAGNOSIS — N529 Male erectile dysfunction, unspecified: Secondary | ICD-10-CM

## 2020-09-15 DIAGNOSIS — F419 Anxiety disorder, unspecified: Secondary | ICD-10-CM

## 2020-09-15 MED ORDER — TADALAFIL 5 MG PO TABS: 5.0000 mg | ORAL_TABLET | Freq: Every day | ORAL | 0 refills | Status: DC | PRN

## 2020-09-15 MED ORDER — TADALAFIL 5 MG PO TABS: 5.0000 mg | ORAL_TABLET | Freq: Every day | ORAL | 3 refills | Status: DC | PRN

## 2020-09-15 NOTE — Telephone Encounter (Signed)
Patient is requesting a refill of the following medications: Requested Prescriptions   Pending Prescriptions Disp Refills  . LORazepam (ATIVAN) 1 MG tablet 30 tablet 1    Sig: TAKE 1/2 TO 1 TABLET EVERY DAY AS NEEDED FOR ANXIETY. USE AS SPARINGLY AS POSSIBLE    Date of patient request: 08/17/20  Last office visit: 07/13/20  Date of last refill: 01/20/20 Last refill amount: 30 tab Follow up time period per chart: 01/11/21

## 2020-09-15 NOTE — Telephone Encounter (Signed)
LVM

## 2020-09-15 NOTE — Telephone Encounter (Signed)
Refill prescription sent in.

## 2020-09-15 NOTE — Telephone Encounter (Signed)
Pt called stating he would like to get another epidural injection in his back and would like a CB to set that up.   [phone]

## 2020-09-15 NOTE — Telephone Encounter (Signed)
Per 09/13/20 procedure note - Return to GI office at appt to be scheduled  Patient is scheduled for a follow up with Dr. Bryan Lemma on Tuesday, 11/09/20 at 3 PM. Appt information sent via My Chart and a letter has been mailed.

## 2020-09-20 NOTE — Telephone Encounter (Signed)
Pt was sch for 4/27

## 2020-09-22 ENCOUNTER — Other Ambulatory Visit: Payer: Self-pay | Admitting: Emergency Medicine

## 2020-09-22 ENCOUNTER — Encounter: Payer: Self-pay | Admitting: Gastroenterology

## 2020-09-22 DIAGNOSIS — F419 Anxiety disorder, unspecified: Secondary | ICD-10-CM

## 2020-09-29 ENCOUNTER — Other Ambulatory Visit: Payer: Self-pay

## 2020-09-29 ENCOUNTER — Encounter: Payer: Self-pay | Admitting: Physical Medicine and Rehabilitation

## 2020-09-29 ENCOUNTER — Ambulatory Visit: Payer: Medicare PPO | Admitting: Physical Medicine and Rehabilitation

## 2020-09-29 VITALS — BP 148/89 | HR 79

## 2020-09-29 DIAGNOSIS — M5416 Radiculopathy, lumbar region: Secondary | ICD-10-CM

## 2020-09-29 DIAGNOSIS — M4316 Spondylolisthesis, lumbar region: Secondary | ICD-10-CM

## 2020-09-29 DIAGNOSIS — M48062 Spinal stenosis, lumbar region with neurogenic claudication: Secondary | ICD-10-CM

## 2020-09-29 DIAGNOSIS — M5116 Intervertebral disc disorders with radiculopathy, lumbar region: Secondary | ICD-10-CM

## 2020-09-29 NOTE — Progress Notes (Signed)
Pt state lower back pain that travels down his right leg to his foot. Pt state he ha some pain down his left leg. Pt state walking, standing and sitting make the pain worse. Pt state strengths and take pain meds to help ease his pain.   Numeric Pain Rating Scale and Functional Assessment Average Pain 7 Pain Right Now 8 My pain is intermittent, sharp, burning, dull, stabbing, tingling and aching Pain is worse with: walking, bending, sitting, standing and some activites Pain improves with: rest, therapy/exercise and medication   In the last MONTH (on 0-10 scale) has pain interfered with the following?  1. General activity like being  able to carry out your everyday physical activities such as walking, climbing stairs, carrying groceries, or moving a chair?  Rating(8)  2. Relation with others like being able to carry out your usual social activities and roles such as  activities at home, at work and in your community. Rating(7)  3. Enjoyment of life such that you have  been bothered by emotional problems such as feeling anxious, depressed or irritable?  Rating(8)

## 2020-09-30 ENCOUNTER — Ambulatory Visit (INDEPENDENT_AMBULATORY_CARE_PROVIDER_SITE_OTHER): Payer: Medicare PPO | Admitting: Physical Medicine and Rehabilitation

## 2020-09-30 ENCOUNTER — Encounter: Payer: Self-pay | Admitting: Physical Medicine and Rehabilitation

## 2020-09-30 ENCOUNTER — Ambulatory Visit: Payer: Self-pay

## 2020-09-30 VITALS — BP 127/81 | HR 66

## 2020-09-30 DIAGNOSIS — M5416 Radiculopathy, lumbar region: Secondary | ICD-10-CM

## 2020-09-30 MED ORDER — BETAMETHASONE SOD PHOS & ACET 6 (3-3) MG/ML IJ SUSP
12.0000 mg | Freq: Once | INTRAMUSCULAR | Status: AC
  Administered 2020-09-30: 12 mg

## 2020-09-30 NOTE — Patient Instructions (Signed)

## 2020-09-30 NOTE — Progress Notes (Signed)
Pt state Lower back pain that travels to his right hip and down his leg. Pt state sitting and fast movement make the pain worse. Pt state he take over the counter pain meds.  Numeric Pain Rating Scale and Functional Assessment Average Pain 7   In the last MONTH (on 0-10 scale) has pain interfered with the following?  1. General activity like being  able to carry out your everyday physical activities such as walking, climbing stairs, carrying groceries, or moving a chair?  Rating(9)   +Driver, -BT, -Dye Allergies.

## 2020-10-20 ENCOUNTER — Encounter: Payer: Medicare PPO | Admitting: Gastroenterology

## 2020-10-25 NOTE — Progress Notes (Signed)
John Mcclain - 65 y.o. male MRN [id number]  Date of birth: 1955/12/05  Office Visit Note: Visit Date: 09/30/2020 PCP: Horald Pollen, MD Referred by: Horald Pollen, *  Subjective: Chief Complaint  Patient presents with  . Lower Back - Pain  . Right Hip - Pain  . Right Leg - Pain   HPI:  John Mcclain is a 65 y.o. male who comes in today at the request of Dr. Laurence Spates for planned Right L5-S1 Lumbar Interlaminar epidural steroid injection with fluoroscopic guidance.  The patient has failed conservative care including home exercise, medications, time and activity modification.  This injection will be diagnostic and hopefully therapeutic.  Please see requesting physician notes for further details and justification. MRI reviewed with images and spine model.  MRI reviewed in the note below.  Sacralized L5 transitional segment.   ROS Otherwise per HPI.  Assessment & Plan: Visit Diagnoses:    ICD-10-CM   1. Lumbar radiculopathy  M54.16 XR C-ARM NO REPORT    Epidural Steroid injection    betamethasone acetate-betamethasone sodium phosphate (CELESTONE) injection 12 mg    Plan: No additional findings.   Meds & Orders:  Meds ordered this encounter  Medications  . betamethasone acetate-betamethasone sodium phosphate (CELESTONE) injection 12 mg    Orders Placed This Encounter  Procedures  . XR C-ARM NO REPORT  . Epidural Steroid injection    Follow-up: Return if symptoms worsen or fail to improve.   Procedures: No procedures performed  Lumbar Epidural Steroid Injection - Interlaminar Approach with Fluoroscopic Guidance  Patient: John Mcclain      Date of Birth: 07-03-1955 MRN: [id number] PCP: Horald Pollen, MD      Visit Date: 09/30/2020   Universal Protocol:     Consent Given By: the patient  Position: PRONE  Additional Comments: Vital signs were monitored before and after the procedure. Patient was prepped and draped in the usual sterile  fashion. The correct patient, procedure, and site was verified.   Injection Procedure Details:   Procedure diagnoses: Lumbar radiculopathy [M54.16]   Meds Administered:  Meds ordered this encounter  Medications  . betamethasone acetate-betamethasone sodium phosphate (CELESTONE) injection 12 mg     Laterality: Right  Location/Site:  L5-S1 patient has a sacralized L5 transitional segment.  Needle: 3.5 in., 20 ga. Tuohy  Needle Placement: Paramedian epidural  Findings:   -Comments: Excellent flow of contrast into the epidural space.  Procedure Details: Using a paramedian approach from the side mentioned above, the region overlying the inferior lamina was localized under fluoroscopic visualization and the soft tissues overlying this structure were infiltrated with 4 ml. of 1% Lidocaine without Epinephrine. The Tuohy needle was inserted into the epidural space using a paramedian approach.   The epidural space was localized using loss of resistance along with counter oblique bi-planar fluoroscopic views.  After negative aspirate for air, blood, and CSF, a 2 ml. volume of Isovue-250 was injected into the epidural space and the flow of contrast was observed. Radiographs were obtained for documentation purposes.    The injectate was administered into the level noted above.   Additional Comments:  No complications occurred Dressing: 2 x 2 sterile gauze and Band-Aid    Post-procedure details: Patient was observed during the procedure. Post-procedure instructions were reviewed.  Patient left the clinic in stable condition.     Clinical History: MRI LUMBAR SPINE WITHOUT CONTRAST  TECHNIQUE: Multiplanar, multisequence MR imaging of the lumbar spine was performed. No intravenous contrast  was administered.  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 Careful correlation  with this numbering strategy prior to any procedural intervention would be recommended.  Alignment: Dextroconvex scoliosis of the lumbar spine. Grade 1 anterolisthesis of L4 over L5, degenerative. Small retrolisthesis from T12 through L3.  Vertebrae: No acute fracture, evidence of discitis, or bone lesion. Mild chronic compression fracture of L4 with approximately 30% loss of vertebral body height. Endplate degenerative changes at T12-L1, L3-4 and L4-5.  Conus medullaris and cauda equina: Conus extends to the T12-L1 level. Conus and cauda equina appear normal.  Paraspinal and other soft tissues: Negative.  Disc levels:  T12-L1: Disc bulge with associated osteophytic component and facet degenerative changes resulting in narrowing of the bilateral subarticular zones, mild-to-moderate right and moderate left neural foraminal narrowing.  L1-2: Disc bulge, facet degenerative changes and ligamentum flavum redundancy resulting in mild spinal canal stenosis with effacement of the bilateral subarticular zones, mild right and moderate left neural foraminal narrowing.  L2-3: Left asymmetric disc bulge, facet degenerative changes and ligamentum flavum redundancy resulting in moderate spinal canal stenosis with narrowing of the bilateral subarticular zones, moderate right and severe left neural foraminal narrowing.  L3-4: Right asymmetric disc bulge with superimposed central disc extrusion migrating inferiorly, facet degenerative changes and ligamentum flavum redundancy resulting in moderate to severe spinal canal stenosis and severe bilateral neural foraminal.  L4-5: Disc bulge, prominent hypertrophic facet degenerative change ligamentum flavum redundancy resulting in moderate spinal canal stenosis with narrowing of the right subarticular zone and severe right neural foraminal narrowing.  L5-S1: No spinal canal or neural foraminal stenosis.  IMPRESSION: 1. A  transitional lumbosacral vertebra is assumed to represent a sacralised L5 level. Careful correlation with this numbering strategy prior to any procedural intervention would be recommended. 2. Multilevel degenerative changes of the lumbar spine with moderate to severe spinal canal stenosis at L3-4 and moderate spinal canal stenosis at L2-3 and L4-5. 3. Multilevel neural foraminal narrowing, severe on the left at L2-3, bilaterally at L3-4 and on the right at L4-5. 4. Mild chronic compression fracture of L4 with approximately 30% loss of vertebral body height.   Electronically Signed   By: Pedro Earls M.D.   On: 03/26/2020 14:46     Objective:  VS:  HT:    WT:   BMI:     BP:127/81  HR:66bpm  TEMP: ( )  RESP:  Physical Exam Vitals and nursing note reviewed.  Constitutional:      General: He is not in acute distress.    Appearance: Normal appearance. He is not ill-appearing.  HENT:     Head: Normocephalic and atraumatic.     Right Ear: External ear normal.     Left Ear: External ear normal.     Nose: No congestion.  Eyes:     Extraocular Movements: Extraocular movements intact.  Cardiovascular:     Rate and Rhythm: Normal rate.     Pulses: Normal pulses.  Pulmonary:     Effort: Pulmonary effort is normal. No respiratory distress.  Abdominal:     General: There is no distension.     Palpations: Abdomen is soft.  Musculoskeletal:        General: No tenderness or signs of injury.     Cervical back: Neck supple.     Right lower leg: No edema.     Left lower leg: No edema.  Comments: Patient has good distal strength without clonus.  Skin:    Findings: No erythema or rash.  Neurological:     General: No focal deficit present.     Mental Status: He is alert and oriented to person, place, and time.     Sensory: No sensory deficit.     Motor: No weakness or abnormal muscle tone.     Coordination: Coordination normal.  Psychiatric:        Mood and  Affect: Mood normal.        Behavior: Behavior normal.      Imaging: No results found.

## 2020-10-25 NOTE — Procedures (Signed)
Lumbar Epidural Steroid Injection - Interlaminar Approach with Fluoroscopic Guidance  Patient: John Mcclain      Date of Birth: 02/27/1956 MRN: [id number] PCP: Horald Pollen, MD      Visit Date: 09/30/2020   Universal Protocol:     Consent Given By: the patient  Position: PRONE  Additional Comments: Vital signs were monitored before and after the procedure. Patient was prepped and draped in the usual sterile fashion. The correct patient, procedure, and site was verified.   Injection Procedure Details:   Procedure diagnoses: Lumbar radiculopathy [M54.16]   Meds Administered:  Meds ordered this encounter  Medications  . betamethasone acetate-betamethasone sodium phosphate (CELESTONE) injection 12 mg     Laterality: Right  Location/Site:  L5-S1 patient has a sacralized L5 transitional segment.  Needle: 3.5 in., 20 ga. Tuohy  Needle Placement: Paramedian epidural  Findings:   -Comments: Excellent flow of contrast into the epidural space.  Procedure Details: Using a paramedian approach from the side mentioned above, the region overlying the inferior lamina was localized under fluoroscopic visualization and the soft tissues overlying this structure were infiltrated with 4 ml. of 1% Lidocaine without Epinephrine. The Tuohy needle was inserted into the epidural space using a paramedian approach.   The epidural space was localized using loss of resistance along with counter oblique bi-planar fluoroscopic views.  After negative aspirate for air, blood, and CSF, a 2 ml. volume of Isovue-250 was injected into the epidural space and the flow of contrast was observed. Radiographs were obtained for documentation purposes.    The injectate was administered into the level noted above.   Additional Comments:  No complications occurred Dressing: 2 x 2 sterile gauze and Band-Aid    Post-procedure details: Patient was observed during the procedure. Post-procedure instructions  were reviewed.  Patient left the clinic in stable condition.

## 2020-10-25 NOTE — Progress Notes (Signed)
John Mcclain - 65 y.o. male MRN [id number]  Date of birth: June 12, 1955  Office Visit Note: Visit Date: 09/29/2020 PCP: Horald Pollen, MD Referred by: Horald Pollen, *  Subjective: Chief Complaint  Patient presents with  . Lower Back - Pain  . Right Leg - Pain  . Left Leg - Pain  . Right Knee - Pain  . Right Foot - Pain   HPI: John Mcclain is a 65 y.o. male who comes in today For evaluation management of continued low back pain with right more than left radicular leg pain down to the foot.  Patient originally referred to me by Dr. Eunice Blase.  He has not been back to Dr. Junius Roads for any follow-up.  We completed epidural injection on the left side at L4-5 back in November with better than 50% relief of his left leg pain and he was actually having less leg pain when we did the injection was having more back pain.  He comes in today with 7 out of 10 intermittent sharp and burning pain some stabbing and tingling and aching down the left leg to the foot more of an L5 distribution to the top of the foot.  It is worse with walking and bending sometimes sitting.  Improves with rest and therapy and exercise.  He has been through multiple bouts of physical therapy with stretching etc. and he continues with home program.  He continues to take Aleve and Flexeril.  His case is complicated by rheumatoid arthritis and history of generalized anxiety and migraine headaches.  He has a fairly complicated lumbar spine with a level of scoliosis with listhesis of L4 on L5 with an L5 transitional segment that is mostly sacralized.  He has multilevel central canal stenosis and in particular at L3-4 with disc herniation and facet arthropathy has moderate severe stenosis.  He has lateral recess narrowing at L4-5.  Review of Systems  Musculoskeletal: Positive for back pain.  Neurological: Positive for tingling.  All other systems reviewed and are negative.  Otherwise per HPI.  Assessment &  Plan: Visit Diagnoses:    ICD-10-CM   1. Lumbar radiculopathy  M54.16   2. Spinal stenosis of lumbar region with neurogenic claudication  M48.062   3. Radiculopathy due to lumbar intervertebral disc disorder  M51.16   4. Spondylolisthesis of lumbar region  M43.16      Plan: Findings:  Chronic worsening severe low back pain with right more than left radicular leg pain more of an L5 distribution in the setting of multilevel central canal narrowing listhesis of L4 on L5 with L5 transitional segment and lateral recess narrowing.  Disc herniation at L3-4.  Prior injection did seem to give him some relief on the left.  Case is complicated by rheumatoid arthritis and level of anxiety.  We went over MRI again at length and the usefulness of injections diagnostically and therapeutically as well as therapy and medications.  Continue on current medications.  I think it be wise to complete right L5-S1 interlaminar epidural steroid injection.  Consideration for transforaminal approach.  Patient may be surgical candidate or possibly spinal cord stimulator trial.  We did not discuss a spinal cord stimulator with him.    Meds & Orders: No orders of the defined types were placed in this encounter.  No orders of the defined types were placed in this encounter.   Follow-up: Return for Right L5-S1 interlaminar epidural steroid injection.   Procedures: No procedures performed  Clinical History: MRI LUMBAR SPINE WITHOUT CONTRAST  TECHNIQUE: Multiplanar, multisequence MR imaging of the lumbar spine was performed. No intravenous contrast was administered.  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 Careful correlation with this numbering strategy prior to any procedural intervention would be recommended.  Alignment: Dextroconvex scoliosis of the lumbar spine. Grade 1 anterolisthesis of L4 over  L5, degenerative. Small retrolisthesis from T12 through L3.  Vertebrae: No acute fracture, evidence of discitis, or bone lesion. Mild chronic compression fracture of L4 with approximately 30% loss of vertebral body height. Endplate degenerative changes at T12-L1, L3-4 and L4-5.  Conus medullaris and cauda equina: Conus extends to the T12-L1 level. Conus and cauda equina appear normal.  Paraspinal and other soft tissues: Negative.  Disc levels:  T12-L1: Disc bulge with associated osteophytic component and facet degenerative changes resulting in narrowing of the bilateral subarticular zones, mild-to-moderate right and moderate left neural foraminal narrowing.  L1-2: Disc bulge, facet degenerative changes and ligamentum flavum redundancy resulting in mild spinal canal stenosis with effacement of the bilateral subarticular zones, mild right and moderate left neural foraminal narrowing.  L2-3: Left asymmetric disc bulge, facet degenerative changes and ligamentum flavum redundancy resulting in moderate spinal canal stenosis with narrowing of the bilateral subarticular zones, moderate right and severe left neural foraminal narrowing.  L3-4: Right asymmetric disc bulge with superimposed central disc extrusion migrating inferiorly, facet degenerative changes and ligamentum flavum redundancy resulting in moderate to severe spinal canal stenosis and severe bilateral neural foraminal.  L4-5: Disc bulge, prominent hypertrophic facet degenerative change ligamentum flavum redundancy resulting in moderate spinal canal stenosis with narrowing of the right subarticular zone and severe right neural foraminal narrowing.  L5-S1: No spinal canal or neural foraminal stenosis.  IMPRESSION: 1. 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 Careful correlation with this numbering strategy prior to any procedural intervention would be recommended. 2.  Multilevel degenerative changes of the lumbar spine with moderate to severe spinal canal stenosis at L3-4 and moderate spinal canal stenosis at L2-3 and L4-5. 3. Multilevel neural foraminal narrowing, severe on the left at L2-3, bilaterally at L3-4 and on the right at L4-5. 4. Mild chronic compression fracture of L4 with approximately 30% loss of vertebral body height.   Electronically Signed   By: Pedro Earls M.D.   On: 03/26/2020 14:46   He reports that he has never smoked. He has never used smokeless tobacco.  Recent Labs    07/13/20 1613  HGBA1C 5.8*    Objective:  VS:  HT:    WT:   BMI:     BP:(!) 148/89  HR:79bpm  TEMP: ( )  RESP:  Physical Exam Vitals and nursing note reviewed.  Constitutional:      General: He is not in acute distress.    Appearance: Normal appearance. He is not ill-appearing.  HENT:     Head: Normocephalic and atraumatic.     Right Ear: External ear normal.     Left Ear: External ear normal.     Nose: No congestion.  Eyes:     Extraocular Movements: Extraocular movements intact.  Cardiovascular:     Rate and Rhythm: Normal rate.     Pulses: Normal pulses.  Pulmonary:     Effort: Pulmonary effort is normal. No respiratory distress.  Abdominal:     General: There is no distension.  Palpations: Abdomen is soft.  Musculoskeletal:        General: No tenderness or signs of injury.     Cervical back: Neck supple.     Right lower leg: No edema.     Left lower leg: No edema.     Comments: Patient has good distal strength without clonus.  Mild pain over the greater trochanters but not concordant.  He does have concordant back pain with facet loading and extension.  He has a negative slump test bilaterally.  He has dysesthesias in the right L5 dermatome.  Skin:    Findings: No erythema or rash.  Neurological:     General: No focal deficit present.     Mental Status: He is alert and oriented to person, place, and time.      Sensory: No sensory deficit.     Motor: No weakness or abnormal muscle tone.     Coordination: Coordination normal.  Psychiatric:        Mood and Affect: Mood normal.        Behavior: Behavior normal.     Ortho Exam  Imaging: No results found.  Past Medical/Family/Surgical/Social History: Medications & Allergies reviewed per EMR, new medications updated. Patient Active Problem List   Diagnosis Date Noted  . Lumbar radiculopathy 05/03/2020  . 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  . Spondylosis without myelopathy or radiculopathy, lumbar region 05/03/2020  . Hypogonadism male 12/26/2011  . Vitamin D deficiency 12/26/2011  . Rheumatoid arthritis ([place]) 12/26/2011  . Migraine headache disorder 12/26/2011  . Anxiety disorder 12/26/2011  . HTN (hypertension), benign 12/26/2011   Past Medical History:  Diagnosis Date  . Anxiety   . Arthritis   . Chronic migraine   . Depression   . Diabetes mellitus without complication ([place])   . Heart murmur   . Hypertension   . Kidney stones   . Raynaud's disease    Family History  Problem Relation Age of Onset  . Kidney disease Mother   . Hypertension Mother   . Arthritis Mother        RA  . COPD Mother   . Hyperlipidemia Mother   . Heart attack Father   . Diabetes Father   . COPD Father   . Hypertension Father   . Migraines Brother   . Stroke Maternal Grandfather   . Alzheimer's disease Paternal Grandmother    Past Surgical History:  Procedure Laterality Date  . COLON SURGERY    . LYMPH NODE DISSECTION     removed lymph node  . VASECTOMY     Social History   Occupational History  . Not on file  Tobacco Use  . Smoking status: Never Smoker  . Smokeless tobacco: Never Used  Vaping Use  . Vaping Use: Never used  Substance and Sexual Activity  . Alcohol use: Yes    Comment: 2-3 per week  . Drug use: No  . Sexual activity: Yes    Partners: Female

## 2020-10-26 ENCOUNTER — Other Ambulatory Visit: Payer: Self-pay

## 2020-10-26 ENCOUNTER — Encounter: Payer: Self-pay | Admitting: Gastroenterology

## 2020-10-26 ENCOUNTER — Telehealth: Payer: Self-pay | Admitting: *Deleted

## 2020-10-26 ENCOUNTER — Ambulatory Visit (AMBULATORY_SURGERY_CENTER): Payer: Medicare PPO | Admitting: Gastroenterology

## 2020-10-26 VITALS — BP 109/76 | HR 64 | Temp 98.2°F | Resp 15 | Ht 65.0 in | Wt 143.0 lb

## 2020-10-26 DIAGNOSIS — K2990 Gastroduodenitis, unspecified, without bleeding: Secondary | ICD-10-CM | POA: Diagnosis not present

## 2020-10-26 DIAGNOSIS — K219 Gastro-esophageal reflux disease without esophagitis: Secondary | ICD-10-CM

## 2020-10-26 DIAGNOSIS — K3189 Other diseases of stomach and duodenum: Secondary | ICD-10-CM | POA: Diagnosis not present

## 2020-10-26 DIAGNOSIS — K2970 Gastritis, unspecified, without bleeding: Secondary | ICD-10-CM | POA: Diagnosis not present

## 2020-10-26 DIAGNOSIS — K259 Gastric ulcer, unspecified as acute or chronic, without hemorrhage or perforation: Secondary | ICD-10-CM

## 2020-10-26 MED ORDER — PANTOPRAZOLE SODIUM 40 MG PO TBEC: 40.0000 mg | DELAYED_RELEASE_TABLET | Freq: Two times a day (BID) | ORAL | 3 refills | Status: DC

## 2020-10-26 MED ORDER — SODIUM CHLORIDE 0.9 % IV SOLN: 500.0000 mL | Freq: Once | INTRAVENOUS | Status: DC

## 2020-10-26 NOTE — Op Note (Signed)
[facility] Patient Name: John Mcclain Procedure Date: 10/26/2020 10:26 AM MRN: [id number] Endoscopist: Gerrit Heck , MD Age: 65 Referring MD:  Date of Birth: 10/11/1955 Gender: Male Account #: [id number] Procedure:                Upper GI endoscopy Indications:              Follow-up of gastric ulcer, Follow-up of gastritis                           EGD in 09/2020 for dysphagia and GERD notable for                            esophageal stricture x2 (16 cm and at [facility]),                            successfully dilated with 54 [address] [place]. Esophageal                            biopsies negative for EoE and his dysphagia has                            since resolved. EGD was also notable for 10 mm                            gastric ulcer and moderate antral gastritis. He was                            treated with Protonix 40 mg PO BID (still taking)                            and Carafate 1 gm (now completed). He presents                            today to evaluate for mucosal healing. GERD well                            controlled on current therapy. Medicines:                Monitored Anesthesia Care Procedure:                Pre-Anesthesia Assessment:                           - Prior to the procedure, a History and Physical                            was performed, and patient medications and                            allergies were reviewed. The patient's tolerance of                            previous anesthesia was also reviewed. The risks  and benefits of the procedure and the sedation                            options and risks were discussed with the patient.                            All questions were answered, and informed consent                            was obtained. Prior Anticoagulants: The patient has                            taken no previous anticoagulant or antiplatelet                            agents. ASA Grade  Assessment: II - A patient with                            mild systemic disease. After reviewing the risks                            and benefits, the patient was deemed in                            satisfactory condition to undergo the procedure.                           After obtaining informed consent, the endoscope was                            passed under direct vision. Throughout the                            procedure, the patient's blood pressure, pulse, and                            oxygen saturations were monitored continuously. The                            Endoscope was introduced through the mouth, and                            advanced to the second part of duodenum. The upper                            GI endoscopy was accomplished without difficulty.                            The patient tolerated the procedure well. Scope In: Scope Out: Findings:                 The examined esophagus was normal.  The Z-line was regular and was found 39 cm from the                            incisors.                           Scattered mild inflammation characterized by                            erythema was found in the gastric antrum. This                            overall appeared improved from prior. The                            previously-noted large gastric ulcer was no longer                            present. Biopsies were taken with a cold forceps                            for Helicobacter pylori testing. Estimated blood                            loss was minimal.                           The gastric fundus and gastric body were normal.                            Additional mucosal biopsies were taken with a cold                            forceps for Helicobacter pylori testing. Estimated                            blood loss was minimal.                           The examined duodenum was normal. Complications:            No  immediate complications. Estimated Blood Loss:     Estimated blood loss was minimal. Estimated blood                            loss was minimal. Impression:               - Normal esophagus.                           - Z-line regular, 39 cm from the incisors.                           - Gastritis. Biopsied.                           -  Normal gastric fundus and gastric body. Biopsied.                           - Normal examined duodenum. Recommendation:           - Patient has a contact number available for                            emergencies. The signs and symptoms of potential                            delayed complications were discussed with the                            patient. Return to normal activities tomorrow.                            Written discharge instructions were provided to the                            patient.                           - Resume previous diet.                           - Continue present medications.                           - Await pathology results.                           - Continue Protonix (pantoprazole) 40 mg PO BID for                            another 4 weeks to promote continued mucosal                            healing, then reduce to 40 mg daily and continue to                            titrate to the lowest effective dose for continued                            reflux control.                           - Return to GI clinic as already scheduled. Gerrit Heck, MD 10/26/2020 10:54:32 AM

## 2020-10-26 NOTE — Progress Notes (Signed)
pt tolerated well. VSS. awake and to recovery. Report given to RN. Bite block left insitu to recovery. 

## 2020-10-26 NOTE — Telephone Encounter (Signed)
Ambulatory refferal for EGD

## 2020-10-26 NOTE — Progress Notes (Signed)
Called to room to assist during endoscopic procedure.  Patient ID and intended procedure confirmed with present staff. Received instructions for my participation in the procedure from the performing physician.  

## 2020-10-26 NOTE — Patient Instructions (Signed)
Handout given for gastritis.  Continue protonix 40mg   twice daily for 4 more weeks, then take 40mg   daily thereafter.  YOU HAD AN ENDOSCOPIC PROCEDURE TODAY AT [facility]:   Refer to the procedure report that was given to you for any specific questions about what was found during the examination.  If the procedure report does not answer your questions, please call your gastroenterologist to clarify.  If you requested that your care partner not be given the details of your procedure findings, then the procedure report has been included in a sealed envelope for you to review at your convenience later.  YOU SHOULD EXPECT: Some feelings of bloating in the abdomen. Passage of more gas than usual.  Walking can help get rid of the air that was put into your GI tract during the procedure and reduce the bloating. If you had a lower endoscopy (such as a colonoscopy or flexible sigmoidoscopy) you may notice spotting of blood in your stool or on the toilet paper. If you underwent a bowel prep for your procedure, you may not have a normal bowel movement for a few days.  Please Note:  You might notice some irritation and congestion in your nose or some drainage.  This is from the oxygen used during your procedure.  There is no need for concern and it should clear up in a day or so.  SYMPTOMS TO REPORT IMMEDIATELY:   Following upper endoscopy (EGD)  Vomiting of blood or coffee ground material  New chest pain or pain under the shoulder blades  Painful or persistently difficult swallowing  New shortness of breath  Fever of 100F or higher  Black, tarry-looking stools  For urgent or emergent issues, a gastroenterologist can be reached at any hour by calling [phone]. Do not use MyChart messaging for urgent concerns.    DIET:  We do recommend a small meal at first, but then you may proceed to your regular diet.  Drink plenty of fluids but you should avoid alcoholic beverages for 24  hours.  ACTIVITY:  You should plan to take it easy for the rest of today and you should NOT DRIVE or use heavy machinery until tomorrow (because of the sedation medicines used during the test).    FOLLOW UP: Our staff will call the number listed on your records 48-72 hours following your procedure to check on you and address any questions or concerns that you may have regarding the information given to you following your procedure. If we do not reach you, we will leave a message.  We will attempt to reach you two times.  During this call, we will ask if you have developed any symptoms of COVID 19. If you develop any symptoms (ie: fever, flu-like symptoms, shortness of breath, cough etc.) before then, please call [phone].  If you test positive for Covid 19 in the 2 weeks post procedure, please call and report this information to [place].    If any biopsies were taken you will be contacted by phone or by letter within the next 1-3 weeks.  Please call us at [phone] if you have not heard about the biopsies in 3 weeks.    SIGNATURES/CONFIDENTIALITY: You and/or your care partner have signed paperwork which will be entered into your electronic medical record.  These signatures attest to the fact that that the information above on your After Visit Summary has been reviewed and is understood.  Full responsibility of the confidentiality of this  discharge information lies with you and/or your care-partner. 

## 2020-10-28 ENCOUNTER — Telehealth: Payer: Self-pay

## 2020-10-28 NOTE — Telephone Encounter (Signed)
  Follow up Call-  Call back number 10/26/2020 09/13/2020  Post procedure Call Back phone  # [phone]  Permission to leave phone message Yes Yes  Some recent data might be hidden     Patient questions:  Do you have a fever, pain , or abdominal swelling? No. Pain Score  0 *  Have you tolerated food without any problems? Yes.    Have you been able to return to your normal activities? Yes.    Do you have any questions about your discharge instructions: Diet   No. Medications  No. Follow up visit  No.  Do you have questions or concerns about your Care? No.  Actions: * If pain score is 4 or above: No action needed, pain <4.   1. Have you developed a fever since your procedure? No   2.   Have you had an respiratory symptoms (SOB or cough) since your procedure? No   3.   Have you tested positive for COVID 19 since your procedure no   4.   Have you had any family members/close contacts diagnosed with the COVID 19 since your procedure?  No    If yes to any of these questions please route to Joylene John, RN and Joella Prince, RN

## 2020-11-05 ENCOUNTER — Encounter: Payer: Self-pay | Admitting: Gastroenterology

## 2020-11-08 ENCOUNTER — Telehealth: Payer: Self-pay | Admitting: Physical Medicine and Rehabilitation

## 2020-11-08 NOTE — Telephone Encounter (Signed)
Needs auth and scheduling for repeat right L5-S1 IL.

## 2020-11-08 NOTE — Telephone Encounter (Signed)
Patient reports 60-70% relief for about a week following his right L5-S1 IL on 4/28. The pain has gradually started to return, but he is still better than prior to the injection . Please advise.

## 2020-11-08 NOTE — Telephone Encounter (Signed)
Repeat  

## 2020-11-08 NOTE — Telephone Encounter (Signed)
Pt called and states the epidural injection didn't work. He would like to know if there is something else he could get done?  Cb [phone]

## 2020-11-09 ENCOUNTER — Ambulatory Visit (INDEPENDENT_AMBULATORY_CARE_PROVIDER_SITE_OTHER): Payer: Medicare PPO | Admitting: Gastroenterology

## 2020-11-09 ENCOUNTER — Other Ambulatory Visit: Payer: Self-pay

## 2020-11-09 ENCOUNTER — Encounter: Payer: Self-pay | Admitting: Gastroenterology

## 2020-11-09 VITALS — BP 138/80 | HR 75 | Ht 65.0 in | Wt 152.0 lb

## 2020-11-09 DIAGNOSIS — K649 Unspecified hemorrhoids: Secondary | ICD-10-CM

## 2020-11-09 DIAGNOSIS — K219 Gastro-esophageal reflux disease without esophagitis: Secondary | ICD-10-CM | POA: Diagnosis not present

## 2020-11-09 NOTE — Telephone Encounter (Signed)
Pt was approve

## 2020-11-09 NOTE — Patient Instructions (Signed)
If you are age 67 or older, your body mass index should be between 23-30. Your Body mass index is 25.29 kg/m. If this is out of the aforementioned range listed, please consider follow up with your Primary Care Provider.  If you are age 50 or younger, your body mass index should be between 19-25. Your Body mass index is 25.29 kg/m. If this is out of the aformentioned range listed, please consider follow up with your Primary Care Provider.   __________________________________________________________  The [facility] providers would like to encourage you to use [facility] to communicate with providers for non-urgent requests or questions.  Due to long hold times on the telephone, sending your provider a message by [facility] may be a faster and more efficient way to get a response.  Please allow 48 business hours for a response.  Please remember that this is for non-urgent requests.  ___________________________________________________________  Return to the clinic as needed. Please contact us if you choose to have a hemorrhoid banding.  Due to recent changes in healthcare laws, you may see the results of your imaging and laboratory studies on MyChart before your provider has had a chance to review them.  We understand that in some cases there may be results that are confusing or concerning to you. Not all laboratory results come back in the same time frame and the provider may be waiting for multiple results in order to interpret others.  Please give [place] 48 hours in order for your provider to thoroughly review all the results before contacting the office for clarification of your results.   Thank you for choosing me and [facility].  Vito Cirigliano, D.O.

## 2020-11-09 NOTE — Progress Notes (Signed)
P  Chief Complaint:    Dysphagia, GERD, procedure follow-up  GI History: Longstanding history of GERD.  Previously well controlled with Zantac, then changed to famotidine 20 mg/day.  Will take a second dose for breakthrough reflux symptoms.    Developed dysphagia in 2021.  EGD in 09/2020 with esophageal dilation with 54 [place] with resolution of dysphagia as below.  Has a history of colon polyps including a "grapelike cluster" that had to be removed surgically in the 1990s.  Since then, has been on 5-year surveillance interval.   Endoscopic History: - Colonoscopy (09/2009): Large hemorrhoids, otherwise normal.  Repeat in 5 years due to prior history of polyps - EGD (09/2020, Dr. Bryan Lemma): Esophageal longitudinal furrows (biopsies negative for EOE) dilated with 65 [place] with mucosal rent at 16 cm and at the [facility].  10 mm gastric ulcer, moderate gastritis (path benign).  Treated with Protonix 40 mg bid and Carafate - Colonoscopy (09/2020, Dr. Bryan Lemma): 5 mm sigmoid polyp (TA), 2 rectal hyperplastic polyps, post polypectomy scar without residual polyp in rectum, sigmoid diverticulosis, internal hemorrhoids.  Repeat 5 years - EGD (10/2020, Dr. Bryan Lemma): Normal esophagus, mild non-H. pylori gastritis  HPI:     Patient is a 65 y.o. male presenting to the Gastroenterology Clinic for follow-up.  He states his dysphagia is completely resolved after esophageal dilation and high-dose PPI.  He is still on high dose Protonix down to 40 mg BID, with plan to reduce to 40 mg/day next week. He has o/w completed course of Carafate.  No breakthrough symptoms and otherwise feels well today.  Offers no complaints today.  Separately, he has a history of internal hemorrhoids.  Intermittently symptomatic with BRBPR and rectal itching.  Somewhat responsive to topical treatment.  Interested in hemorrhoid banding, which she would like to set up at a later date.    Review of systems:     No  chest pain, no SOB, no fevers, no urinary sx   Past Medical History:  Diagnosis Date  . Anxiety   . Arthritis   . Chronic migraine   . Depression   . Diabetes mellitus without complication ([place])   . Heart murmur   . Hypertension   . Kidney stones   . Raynaud's disease     Patient's surgical history, family medical history, social history, medications and allergies were all reviewed in Epic    Current Outpatient Medications  Medication Sig Dispense Refill  . alendronate (FOSAMAX) 70 MG tablet Take 70 mg by mouth once a week. Take with a full glass of water on an empty stomach.    Marland Kitchen amLODipine (NORVASC) 2.5 MG tablet TAKE 1 TABLET EVERY DAY (NEED TO SCHEDULE A 6 MONTH FOLLOW UP APPOINTMENT) 90 tablet 1  . Calcium Carbonate-Vitamin D (CALCIUM 600+D PO) Take by mouth daily.    . cyclobenzaprine (FLEXERIL) 10 MG tablet Take 10 mg by mouth 3 (three) times daily as needed.    . folic acid (FOLVITE) 1 MG tablet Take 1 mg by mouth daily.    . Fremanezumab-vfrm (AJOVY) 225 MG/1.5ML SOAJ Inject 1.5 mLs into the skin every 30 (thirty) days.    . hydrocortisone (ANUSOL-HC) 2.5 % rectal cream Place 1 application rectally 2 (two) times daily. 30 g 0  . levETIRAcetam (KEPPRA) 750 MG tablet Take 750 mg by mouth 2 (two) times daily.     Marland Kitchen lisinopril (ZESTRIL) 10 MG tablet TAKE 1 TABLET EVERY DAY 90 tablet 3  . LORazepam (ATIVAN) 1  MG tablet TAKE 1/2 TO 1 TABLET EVERY DAY AS NEEDED FOR ANXIETY. USE AS SPARINGLY AS POSSIBLE 30 tablet 1  . Magnesium 400 MG TABS Take by mouth daily.    . naproxen sodium (ALEVE) 220 MG tablet     . pantoprazole (PROTONIX) 40 MG tablet Take 1 tablet (40 mg total) by mouth 2 (two) times daily. Protonix 40 mg twice daily for 8 weeks, then reduce to 40 mg daily. 90 tablet 3  . polyethylene glycol powder (GLYCOLAX/MIRALAX) powder Take 17 g by mouth daily. 3350 g prn  . predniSONE (DELTASONE) 10 MG tablet Take 10 mg by mouth daily as needed.    . rosuvastatin (CRESTOR) 5 MG  tablet TAKE 1 TABLET EVERY DAY 90 tablet 3  . tadalafil (CIALIS) 5 MG tablet Take 1 tablet (5 mg total) by mouth daily as needed for erectile dysfunction. 90 tablet 3  . Upadacitinib ER 15 MG TB24 Take by mouth daily.     No current facility-administered medications for this visit.    Physical Exam:     BP 138/80   Pulse 75   Ht 5\' 5"  (1.651 m)   Wt 152 lb (68.9 kg)   SpO2 98%   BMI 25.29 kg/m   GENERAL:  Pleasant male in NAD PSYCH: : Cooperative, normal affect Musculoskeletal:  Normal muscle tone, normal strength NEURO: Alert and oriented x 3, no focal neurologic deficits   IMPRESSION and PLAN:    1) GERD - Well-controlled on high-dose PPI - Plan to start weaning Protonix to 40 mg/day starting next week.  Given predominance of nocturnal symptoms, plan to remove morning dose first.  Continue to titrate to lowest effective dose - Continue antireflux lifestyle/dietary modifications  2) Dysphagia-resolved - Resolved with esophageal dilation and high-dose PPI  3) Internal hemorrhoids - He would like to schedule hemorrhoid banding at a future date for more definitive management of symptomatic internal hemorrhoids - Patient to call to schedule appointment - Continue conservative management for now  4) History of colon polyps - Repeat colonoscopy in 2027 for ongoing surveillance         Lavena Bullion ,DO, [place] 11/09/2020, 3:15 PM

## 2020-11-09 NOTE — Telephone Encounter (Signed)
Pt was approve Tracking# [id number]

## 2020-11-10 NOTE — Telephone Encounter (Signed)
Scheduled for 6/9 at 1500 with driver and no blood thinners.

## 2020-11-11 ENCOUNTER — Encounter: Payer: Self-pay | Admitting: Physical Medicine and Rehabilitation

## 2020-11-11 ENCOUNTER — Ambulatory Visit (INDEPENDENT_AMBULATORY_CARE_PROVIDER_SITE_OTHER): Payer: Medicare PPO | Admitting: Physical Medicine and Rehabilitation

## 2020-11-11 ENCOUNTER — Ambulatory Visit: Payer: Self-pay

## 2020-11-11 ENCOUNTER — Other Ambulatory Visit: Payer: Self-pay

## 2020-11-11 VITALS — BP 145/81 | HR 67

## 2020-11-11 DIAGNOSIS — M5416 Radiculopathy, lumbar region: Secondary | ICD-10-CM | POA: Diagnosis not present

## 2020-11-11 MED ORDER — BETAMETHASONE SOD PHOS & ACET 6 (3-3) MG/ML IJ SUSP
12.0000 mg | Freq: Once | INTRAMUSCULAR | Status: AC
  Administered 2020-11-11: 12 mg

## 2020-11-11 NOTE — Patient Instructions (Signed)

## 2020-11-11 NOTE — Progress Notes (Signed)
Pt state lower back pain that travels down his right leg. Pt state walking and standing makes the pain worse. Pt state he uses heating pads and over the counter pain meds. Pt has hx of inj on 09/30/20 pt state it took three days to work, then lasted for six days.  Numeric Pain Rating Scale and Functional Assessment Average Pain 8   In the last MONTH (on 0-10 scale) has pain interfered with the following?  1. General activity like being  able to carry out your everyday physical activities such as walking, climbing stairs, carrying groceries, or moving a chair?  Rating(10)   +Driver, -BT, -Dye Allergies.

## 2020-11-16 NOTE — Procedures (Signed)
Lumbar Epidural Steroid Injection - Interlaminar Approach with Fluoroscopic Guidance  Patient: John Mcclain      Date of Birth: 07-10-1955 MRN: [id number] PCP: Horald Pollen, MD      Visit Date: 11/11/2020   Universal Protocol:     Consent Given By: the patient  Position: PRONE  Additional Comments: Vital signs were monitored before and after the procedure. Patient was prepped and draped in the usual sterile fashion. The correct patient, procedure, and site was verified.   Injection Procedure Details:   Procedure diagnoses: Lumbar radiculopathy [M54.16]   Meds Administered:  Meds ordered this encounter  Medications   betamethasone acetate-betamethasone sodium phosphate (CELESTONE) injection 12 mg     Laterality: Right  Location/Site:  L4-L5 this is based on numbering scheme designating L5 as transitional  Needle: 3.5 in., 20 ga. Tuohy  Needle Placement: Paramedian epidural  Findings:   -Comments: Excellent flow of contrast into the epidural space.  Procedure Details: Using a paramedian approach from the side mentioned above, the region overlying the inferior lamina was localized under fluoroscopic visualization and the soft tissues overlying this structure were infiltrated with 4 ml. of 1% Lidocaine without Epinephrine. The Tuohy needle was inserted into the epidural space using a paramedian approach.   The epidural space was localized using loss of resistance along with counter oblique bi-planar fluoroscopic views.  After negative aspirate for air, blood, and CSF, a 2 ml. volume of Isovue-250 was injected into the epidural space and the flow of contrast was observed. Radiographs were obtained for documentation purposes.    The injectate was administered into the level noted above.   Additional Comments:  The patient tolerated the procedure well Dressing: 2 x 2 sterile gauze and Band-Aid    Post-procedure details: Patient was observed during the  procedure. Post-procedure instructions were reviewed.  Patient left the clinic in stable condition.

## 2020-11-16 NOTE — Progress Notes (Signed)
John Mcclain - 65 y.o. male MRN [id number]  Date of birth: 07-May-1956  Office Visit Note: Visit Date: 11/11/2020 PCP: Horald Pollen, MD Referred by: Horald Pollen, *  Subjective: Chief Complaint  Patient presents with   Lower Back - Pain   Right Leg - Pain   HPI:  John Mcclain is a 65 y.o. male who comes in today at the request of Dr. Eunice Blase for planned Right L4-L5 Lumbar Interlaminar epidural steroid injection with fluoroscopic guidance.  The patient has failed conservative care including home exercise, medications, time and activity modification.  This injection will be diagnostic and hopefully therapeutic.  Please see requesting physician notes for further details and justification. MRI reviewed with images and spine model.  MRI reviewed in the note below.  Patient with more L5 symptoms and we did complete a right L5-S1 interlaminar injection at the last visit.  Patient has a transitional L5 segment.  This seemed to help short-term.  Prior injection done in the past was at L4-5 injection that seemed to help better so we will repeat that today.     ROS Otherwise per HPI.  Assessment & Plan: Visit Diagnoses:    ICD-10-CM   1. Lumbar radiculopathy  M54.16 XR C-ARM NO REPORT    Epidural Steroid injection    betamethasone acetate-betamethasone sodium phosphate (CELESTONE) injection 12 mg      Plan: No additional findings.   Meds & Orders:  Meds ordered this encounter  Medications   betamethasone acetate-betamethasone sodium phosphate (CELESTONE) injection 12 mg    Orders Placed This Encounter  Procedures   XR C-ARM NO REPORT   Epidural Steroid injection    Follow-up: Return if symptoms worsen or fail to improve.   Procedures: No procedures performed  Lumbar Epidural Steroid Injection - Interlaminar Approach with Fluoroscopic Guidance  Patient: John Mcclain      Date of Birth: [birth year] MRN: [id number] PCP: Horald Pollen, MD      Visit  Date: 11/11/2020   Universal Protocol:     Consent Given By: the patient  Position: PRONE  Additional Comments: Vital signs were monitored before and after the procedure. Patient was prepped and draped in the usual sterile fashion. The correct patient, procedure, and site was verified.   Injection Procedure Details:   Procedure diagnoses: Lumbar radiculopathy [M54.16]   Meds Administered:  Meds ordered this encounter  Medications   betamethasone acetate-betamethasone sodium phosphate (CELESTONE) injection 12 mg     Laterality: Right  Location/Site:  L4-L5 this is based on numbering scheme designating L5 as transitional  Needle: 3.5 in., 20 ga. Tuohy  Needle Placement: Paramedian epidural  Findings:   -Comments: Excellent flow of contrast into the epidural space.  Procedure Details: Using a paramedian approach from the side mentioned above, the region overlying the inferior lamina was localized under fluoroscopic visualization and the soft tissues overlying this structure were infiltrated with 4 ml. of 1% Lidocaine without Epinephrine. The Tuohy needle was inserted into the epidural space using a paramedian approach.   The epidural space was localized using loss of resistance along with counter oblique bi-planar fluoroscopic views.  After negative aspirate for air, blood, and CSF, a 2 ml. volume of Isovue-250 was injected into the epidural space and the flow of contrast was observed. Radiographs were obtained for documentation purposes.    The injectate was administered into the level noted above.   Additional Comments:  The patient tolerated the procedure well Dressing: 2 x 2  sterile gauze and Band-Aid    Post-procedure details: Patient was observed during the procedure. Post-procedure instructions were reviewed.  Patient left the clinic in stable condition.   Clinical History: MRI LUMBAR SPINE WITHOUT CONTRAST   TECHNIQUE: Multiplanar, multisequence MR  imaging of the lumbar spine was performed. No intravenous contrast was administered.   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 Careful correlation with this numbering strategy prior to any procedural intervention would be recommended.   Alignment: Dextroconvex scoliosis of the lumbar spine. Grade 1 anterolisthesis of L4 over L5, degenerative. Small retrolisthesis from T12 through L3.   Vertebrae: No acute fracture, evidence of discitis, or bone lesion. Mild chronic compression fracture of L4 with approximately 30% loss of vertebral body height. Endplate degenerative changes at T12-L1, L3-4 and L4-5.   Conus medullaris and cauda equina: Conus extends to the T12-L1 level. Conus and cauda equina appear normal.   Paraspinal and other soft tissues: Negative.   Disc levels:   T12-L1: Disc bulge with associated osteophytic component and facet degenerative changes resulting in narrowing of the bilateral subarticular zones, mild-to-moderate right and moderate left neural foraminal narrowing.   L1-2: Disc bulge, facet degenerative changes and ligamentum flavum redundancy resulting in mild spinal canal stenosis with effacement of the bilateral subarticular zones, mild right and moderate left neural foraminal narrowing.   L2-3: Left asymmetric disc bulge, facet degenerative changes and ligamentum flavum redundancy resulting in moderate spinal canal stenosis with narrowing of the bilateral subarticular zones, moderate right and severe left neural foraminal narrowing.   L3-4: Right asymmetric disc bulge with superimposed central disc extrusion migrating inferiorly, facet degenerative changes and ligamentum flavum redundancy resulting in moderate to severe spinal canal stenosis and severe bilateral neural foraminal.   L4-5: Disc bulge, prominent hypertrophic facet degenerative  change ligamentum flavum redundancy resulting in moderate spinal canal stenosis with narrowing of the right subarticular zone and severe right neural foraminal narrowing.   L5-S1: No spinal canal or neural foraminal stenosis.   IMPRESSION: 1. 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 Careful correlation with this numbering strategy prior to any procedural intervention would be recommended. 2. Multilevel degenerative changes of the lumbar spine with moderate to severe spinal canal stenosis at L3-4 and moderate spinal canal stenosis at L2-3 and L4-5. 3. Multilevel neural foraminal narrowing, severe on the left at L2-3, bilaterally at L3-4 and on the right at L4-5. 4. Mild chronic compression fracture of L4 with approximately 30% loss of vertebral body height.     Electronically Signed   By: Pedro Earls M.D.   On: 03/26/2020 14:46     Objective:  VS:  HT:    WT:   BMI:     BP:(!) 145/81  HR:67bpm  TEMP: ( )  RESP:  Physical Exam Vitals and nursing note reviewed.  Constitutional:      General: He is not in acute distress.    Appearance: Normal appearance. He is not ill-appearing.  HENT:     Head: Normocephalic and atraumatic.     Right Ear: External ear normal.     Left Ear: External ear normal.     Nose: No congestion.  Eyes:     Extraocular Movements: Extraocular movements intact.  Cardiovascular:     Rate and Rhythm: Normal rate.     Pulses: Normal pulses.  Pulmonary:     Effort:  Pulmonary effort is normal. No respiratory distress.  Abdominal:     General: There is no distension.     Palpations: Abdomen is soft.  Musculoskeletal:        General: No tenderness or signs of injury.     Cervical back: Neck supple.     Right lower leg: No edema.     Left lower leg: No edema.     Comments: Patient has good distal strength without clonus.  Skin:    Findings: No erythema or rash.  Neurological:      General: No focal deficit present.     Mental Status: He is alert and oriented to person, place, and time.     Sensory: No sensory deficit.     Motor: No weakness or abnormal muscle tone.     Coordination: Coordination normal.  Psychiatric:        Mood and Affect: Mood normal.        Behavior: Behavior normal.     Imaging: No results found.

## 2020-11-29 ENCOUNTER — Telehealth: Payer: Self-pay | Admitting: Physical Medicine and Rehabilitation

## 2020-11-29 NOTE — Telephone Encounter (Signed)
Pt called wanting to get a follow up with Dr. Ernestina Patches. The best call back number is [phone].

## 2020-11-30 NOTE — Telephone Encounter (Signed)
Pt req repeat inj Right L4-L5 IL, same pain on the right side and no new jury. Pt last inj was on 11/11/20 & 09/30/20. Please Advise

## 2020-11-30 NOTE — Telephone Encounter (Signed)
Authorization #[id number]

## 2020-11-30 NOTE — Telephone Encounter (Signed)
Called pt and sch 7/18.

## 2020-12-07 ENCOUNTER — Other Ambulatory Visit: Payer: Self-pay

## 2020-12-07 ENCOUNTER — Encounter: Payer: Self-pay | Admitting: Dermatology

## 2020-12-07 ENCOUNTER — Ambulatory Visit: Payer: Medicare PPO | Admitting: Dermatology

## 2020-12-07 DIAGNOSIS — Z1283 Encounter for screening for malignant neoplasm of skin: Secondary | ICD-10-CM

## 2020-12-07 DIAGNOSIS — L739 Follicular disorder, unspecified: Secondary | ICD-10-CM

## 2020-12-07 DIAGNOSIS — B351 Tinea unguium: Secondary | ICD-10-CM

## 2020-12-07 LAB — POCT SKIN KOH

## 2020-12-07 MED ORDER — MINOCYCLINE HCL 50 MG PO CAPS: 50.0000 mg | ORAL_CAPSULE | Freq: Two times a day (BID) | ORAL | 1 refills | Status: DC

## 2020-12-07 MED ORDER — CLOBETASOL PROPIONATE 0.05 % EX FOAM: Freq: Two times a day (BID) | CUTANEOUS | 0 refills | Status: DC

## 2020-12-07 NOTE — Progress Notes (Signed)
[place]

## 2020-12-14 ENCOUNTER — Other Ambulatory Visit: Payer: Self-pay | Admitting: Emergency Medicine

## 2020-12-14 DIAGNOSIS — Z299 Encounter for prophylactic measures, unspecified: Secondary | ICD-10-CM

## 2020-12-14 DIAGNOSIS — F419 Anxiety disorder, unspecified: Secondary | ICD-10-CM

## 2020-12-20 ENCOUNTER — Ambulatory Visit: Payer: Self-pay

## 2020-12-20 ENCOUNTER — Other Ambulatory Visit: Payer: Self-pay

## 2020-12-20 ENCOUNTER — Encounter: Payer: Self-pay | Admitting: Physical Medicine and Rehabilitation

## 2020-12-20 ENCOUNTER — Ambulatory Visit (INDEPENDENT_AMBULATORY_CARE_PROVIDER_SITE_OTHER): Payer: Medicare PPO | Admitting: Physical Medicine and Rehabilitation

## 2020-12-20 VITALS — BP 150/77 | HR 76

## 2020-12-20 DIAGNOSIS — M5416 Radiculopathy, lumbar region: Secondary | ICD-10-CM | POA: Diagnosis not present

## 2020-12-20 DIAGNOSIS — M48062 Spinal stenosis, lumbar region with neurogenic claudication: Secondary | ICD-10-CM

## 2020-12-20 MED ORDER — METHYLPREDNISOLONE ACETATE 80 MG/ML IJ SUSP
80.0000 mg | Freq: Once | INTRAMUSCULAR | Status: AC
  Administered 2020-12-20: 80 mg

## 2020-12-20 NOTE — Progress Notes (Signed)
    Numeric Pain Rating Scale and Functional Assessment Average Pain (7)   In the last MONTH (on 0-10 scale) has pain interfered with the following?  1. General activity like being  able to carry out your everyday physical activities such as walking, climbing stairs, carrying groceries, or moving a chair?  Rating(10)   +Driver, -BT, -Dye Allergies.   

## 2020-12-20 NOTE — Patient Instructions (Signed)

## 2020-12-21 ENCOUNTER — Encounter: Payer: Self-pay | Admitting: Dermatology

## 2020-12-21 NOTE — Progress Notes (Signed)
   New Patient   Subjective  John Mcclain is a 65 y.o. male who presents for the following: Skin Problem (Scalp issue bumps that get sore doxycycline and dhs tarsum shampoo treatment per Dr Nevada Crane, also fingernails right splitting x years no treatment ).  Issue with scalp and fingernails. Location:  Duration:  Quality:  Associated Signs/Symptoms: Modifying Factors:  Severity:  Timing: Context:    The following portions of the chart were reviewed this encounter and updated as appropriate:  Tobacco  Allergies  Meds  Problems  Med Hx  Surg Hx  Fam Hx      Objective  Well appearing patient in no apparent distress; mood and affect are within normal limits. Mid Back Waist up skin examination, no atypical pigmented lesions or nonmelanoma skin cancer  Right 2nd Finger Medial Paronychium, Right 3rd Finger Nail Plate, Right Thumb Proximal Nail Fold Opacification, Longitudinal ridging, slightly onycholysis with minimal subungual debris.  KOH negative; culture done.  Three nails on the right hand index middle thumb  Mid Occipital Scalp Patchy moderate follicular inflammatory papules and crusts.  No involvement of ears or face.  Origin likely multifactorial, underlying inflammation, hair follicle microbes, secondary scratching and picking.    All skin waist up examined.   Assessment & Plan  Screening exam for skin cancer Mid Back  Annual skin examination, encouraged to self examine with spouse twice annually.  Onychomycosis (3) Right 2nd Finger Medial Paronychium; Right 3rd Finger Nail Plate; Right Thumb Proximal Nail Fold  Full discussion about both fungal and nonfunction causes for onychodystrophy.  All treatment options for nail fungus reviewed.  Await fungal culture.  Related Procedures POCT Skin KOH Culture, fungus without smear  Folliculitis Mid Occipital Scalp  Oral minocycline 50 mg twice daily for 1 to 2 months (all risks detailed).  Topical clobetasol on areas  prone to get sores nightly for 4weeks.  Avoid use on face.  Follow-up by MyChart at that time.  clobetasol (OLUX) 0.05 % topical foam - Mid Occipital Scalp Apply topically 2 (two) times daily.  Related Medications minocycline (MINOCIN) 50 MG capsule Take 1 capsule (50 mg total) by mouth 2 (two) times daily.

## 2020-12-22 NOTE — Procedures (Signed)
Lumbosacral Transforaminal Epidural Steroid Injection - Sub-Pedicular Approach with Fluoroscopic Guidance  Patient: John Mcclain      Date of Birth: [birth year] MRN: [id number] PCP: Horald Pollen, MD      Visit Date: 12/20/2020   Universal Protocol:    Date/Time: 12/20/2020  Consent Given By: the patient  Position: PRONE  Additional Comments: Vital signs were monitored before and after the procedure. Patient was prepped and draped in the usual sterile fashion. The correct patient, procedure, and site was verified.   Injection Procedure Details:   Procedure diagnoses: Lumbar radiculopathy [M54.16]    Meds Administered:  Meds ordered this encounter  Medications   methylPREDNISolone acetate (DEPO-MEDROL) injection 80 mg    Laterality: Right  Location/Site: The numbering scheme based on a sacralized L5 transitional segment. L3-L4 L4-L5  Needle:5.0 in., 22 ga.  Short bevel or Quincke spinal needle  Needle Placement: Transforaminal  Findings:    -Comments: Excellent flow of contrast along the nerve, nerve root and into the epidural space.  Procedure Details: After squaring off the end-plates to get a true AP view, the C-arm was positioned so that an oblique view of the foramen as noted above was visualized. The target area is just inferior to the "nose of the scotty dog" or sub pedicular. The soft tissues overlying this structure were infiltrated with 2-3 ml. of 1% Lidocaine without Epinephrine.  The spinal needle was inserted toward the target using a "trajectory" view along the fluoroscope beam.  Under AP and lateral visualization, the needle was advanced so it did not puncture dura and was located close the 6 O'Clock position of the pedical in AP tracterory. Biplanar projections were used to confirm position. Aspiration was confirmed to be negative for CSF and/or blood. A 1-2 ml. volume of Isovue-250 was injected and flow of contrast was noted at each level.  Radiographs were obtained for documentation purposes.   After attaining the desired flow of contrast documented above, a 0.5 to 1.0 ml test dose of 0.25% Marcaine was injected into each respective transforaminal space.  The patient was observed for 90 seconds post injection.  After no sensory deficits were reported, and normal lower extremity motor function was noted,   the above injectate was administered so that equal amounts of the injectate were placed at each foramen (level) into the transforaminal epidural space.   Additional Comments:  The patient tolerated the procedure well Dressing: 2 x 2 sterile gauze and Band-Aid    Post-procedure details: Patient was observed during the procedure. Post-procedure instructions were reviewed.  Patient left the clinic in stable condition.

## 2020-12-22 NOTE — Progress Notes (Signed)
John Mcclain - 65 y.o. male MRN [id number]  Date of birth: 12-23-1955  Office Visit Note: Visit Date: 12/20/2020 PCP: Horald Pollen, MD Referred by: Horald Pollen, *  Subjective: Chief Complaint  Patient presents with   Lower Back - Pain   Right Hip - Pain   Right Leg - Pain   HPI:  John Mcclain is a 65 y.o. male who comes in today For planned right L3 and L4 transforaminal epidural steroid injection.  By point of review patient has severe recalcitrant at this point right hip and leg pain consistent with radicular pain and somewhat more of an L5 type distribution.  According to the MRI numbering scheme of his lumbar spine he has a sacralized L5 transitional segment.  Injection today based on that numbering scheme.  Patient has had 2 prior interlaminar injections with only very mild relief.  Diagnostically they did help but they did not last long at all.  He had a prior transforaminal injection last year for the left-sided pain which really greatly helps.  He is getting a little bit of left-sided symptoms again but not as bad.  He has a pretty degenerative spine with scoliosis listhesis and stenosis.  Depending on relief he would be a candidate for spine surgical evaluation although I fear it would be a big surgery for it any type of correction.  He still active at 65 years old.  He would be a good candidate for spinal cord stimulator trial and implant.  He is really had no other relief overall.  ROS Otherwise per HPI.  Assessment & Plan: Visit Diagnoses:    ICD-10-CM   1. Lumbar radiculopathy  M54.16 XR C-ARM NO REPORT    Epidural Steroid injection    methylPREDNISolone acetate (DEPO-MEDROL) injection 80 mg    2. Spinal stenosis of lumbar region with neurogenic claudication  M48.062 XR C-ARM NO REPORT    Epidural Steroid injection    methylPREDNISolone acetate (DEPO-MEDROL) injection 80 mg      Plan: No additional findings.   Meds & Orders:  Meds ordered this  encounter  Medications   methylPREDNISolone acetate (DEPO-MEDROL) injection 80 mg    Orders Placed This Encounter  Procedures   XR C-ARM NO REPORT   Epidural Steroid injection    Follow-up: Return if symptoms worsen or fail to improve.   Procedures: No procedures performed  Lumbosacral Transforaminal Epidural Steroid Injection - Sub-Pedicular Approach with Fluoroscopic Guidance  Patient: Klinton Mcclain      Date of Birth: Jul 10, 1955 MRN: [id number] PCP: Horald Pollen, MD      Visit Date: 12/20/2020   Universal Protocol:    Date/Time: 12/20/2020  Consent Given By: the patient  Position: PRONE  Additional Comments: Vital signs were monitored before and after the procedure. Patient was prepped and draped in the usual sterile fashion. The correct patient, procedure, and site was verified.   Injection Procedure Details:   Procedure diagnoses: Lumbar radiculopathy [M54.16]    Meds Administered:  Meds ordered this encounter  Medications   methylPREDNISolone acetate (DEPO-MEDROL) injection 80 mg    Laterality: Right  Location/Site: The numbering scheme based on a sacralized L5 transitional segment. L3-L4 L4-L5  Needle:5.0 in., 22 ga.  Short bevel or Quincke spinal needle  Needle Placement: Transforaminal  Findings:    -Comments: Excellent flow of contrast along the nerve, nerve root and into the epidural space.  Procedure Details: After squaring off the end-plates to get a true AP view, the C-arm  was positioned so that an oblique view of the foramen as noted above was visualized. The target area is just inferior to the "nose of the scotty dog" or sub pedicular. The soft tissues overlying this structure were infiltrated with 2-3 ml. of 1% Lidocaine without Epinephrine.  The spinal needle was inserted toward the target using a "trajectory" view along the fluoroscope beam.  Under AP and lateral visualization, the needle was advanced so it did not puncture dura  and was located close the 6 O'Clock position of the pedical in AP tracterory. Biplanar projections were used to confirm position. Aspiration was confirmed to be negative for CSF and/or blood. A 1-2 ml. volume of Isovue-250 was injected and flow of contrast was noted at each level. Radiographs were obtained for documentation purposes.   After attaining the desired flow of contrast documented above, a 0.5 to 1.0 ml test dose of 0.25% Marcaine was injected into each respective transforaminal space.  The patient was observed for 90 seconds post injection.  After no sensory deficits were reported, and normal lower extremity motor function was noted,   the above injectate was administered so that equal amounts of the injectate were placed at each foramen (level) into the transforaminal epidural space.   Additional Comments:  The patient tolerated the procedure well Dressing: 2 x 2 sterile gauze and Band-Aid    Post-procedure details: Patient was observed during the procedure. Post-procedure instructions were reviewed.  Patient left the clinic in stable condition.    Clinical History: MRI LUMBAR SPINE WITHOUT CONTRAST   TECHNIQUE: Multiplanar, multisequence MR imaging of the lumbar spine was performed. No intravenous contrast was administered.   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 Careful correlation with this numbering strategy prior to any procedural intervention would be recommended.   Alignment: Dextroconvex scoliosis of the lumbar spine. Grade 1 anterolisthesis of L4 over L5, degenerative. Small retrolisthesis from T12 through L3.   Vertebrae: No acute fracture, evidence of discitis, or bone lesion. Mild chronic compression fracture of L4 with approximately 30% loss of vertebral body height. Endplate degenerative changes at T12-L1, L3-4 and L4-5.   Conus medullaris  and cauda equina: Conus extends to the T12-L1 level. Conus and cauda equina appear normal.   Paraspinal and other soft tissues: Negative.   Disc levels:   T12-L1: Disc bulge with associated osteophytic component and facet degenerative changes resulting in narrowing of the bilateral subarticular zones, mild-to-moderate right and moderate left neural foraminal narrowing.   L1-2: Disc bulge, facet degenerative changes and ligamentum flavum redundancy resulting in mild spinal canal stenosis with effacement of the bilateral subarticular zones, mild right and moderate left neural foraminal narrowing.   L2-3: Left asymmetric disc bulge, facet degenerative changes and ligamentum flavum redundancy resulting in moderate spinal canal stenosis with narrowing of the bilateral subarticular zones, moderate right and severe left neural foraminal narrowing.   L3-4: Right asymmetric disc bulge with superimposed central disc extrusion migrating inferiorly, facet degenerative changes and ligamentum flavum redundancy resulting in moderate to severe spinal canal stenosis and severe bilateral neural foraminal.   L4-5: Disc bulge, prominent hypertrophic facet degenerative change ligamentum flavum redundancy resulting in moderate spinal canal stenosis with narrowing of the right subarticular zone and severe right neural foraminal narrowing.   L5-S1: No spinal canal or neural foraminal stenosis.   IMPRESSION: 1. 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  Careful correlation with this numbering strategy prior to any procedural intervention would be recommended. 2. Multilevel degenerative changes of the lumbar spine with moderate to severe spinal canal stenosis at L3-4 and moderate spinal canal stenosis at L2-3 and L4-5. 3. Multilevel neural foraminal narrowing, severe on the left at L2-3, bilaterally at L3-4 and on the right at L4-5. 4. Mild chronic compression  fracture of L4 with approximately 30% loss of vertebral body height.     Electronically Signed   By: Pedro Earls M.D.   On: 03/26/2020 14:46     Objective:  VS:  HT:    WT:   BMI:     BP:(!) 150/77  HR:76bpm  TEMP: ( )  RESP:  Physical Exam Vitals and nursing note reviewed.  Constitutional:      General: He is not in acute distress.    Appearance: Normal appearance. He is not ill-appearing.  HENT:     Head: Normocephalic and atraumatic.     Right Ear: External ear normal.     Left Ear: External ear normal.     Nose: No congestion.  Eyes:     Extraocular Movements: Extraocular movements intact.  Cardiovascular:     Rate and Rhythm: Normal rate.     Pulses: Normal pulses.  Pulmonary:     Effort: Pulmonary effort is normal. No respiratory distress.  Abdominal:     General: There is no distension.     Palpations: Abdomen is soft.  Musculoskeletal:        General: No tenderness or signs of injury.     Cervical back: Neck supple.     Right lower leg: No edema.     Left lower leg: No edema.     Comments: Patient has good distal strength without clonus.  Skin:    Findings: No erythema or rash.  Neurological:     General: No focal deficit present.     Mental Status: He is alert and oriented to person, place, and time.     Sensory: No sensory deficit.     Motor: No weakness or abnormal muscle tone.     Coordination: Coordination normal.  Psychiatric:        Mood and Affect: Mood normal.        Behavior: Behavior normal.     Imaging: No results found.

## 2021-01-06 LAB — CULTURE, FUNGUS WITHOUT SMEAR
CULTURE:: NO GROWTH
MICRO NUMBER:: 12081776
SPECIMEN QUALITY:: ADEQUATE

## 2021-01-11 ENCOUNTER — Ambulatory Visit (INDEPENDENT_AMBULATORY_CARE_PROVIDER_SITE_OTHER): Payer: Medicare PPO | Admitting: Emergency Medicine

## 2021-01-11 ENCOUNTER — Other Ambulatory Visit: Payer: Self-pay

## 2021-01-11 ENCOUNTER — Encounter: Payer: Self-pay | Admitting: Emergency Medicine

## 2021-01-11 ENCOUNTER — Telehealth: Payer: Self-pay

## 2021-01-11 VITALS — BP 138/82 | HR 67 | Temp 98.5°F | Ht 65.0 in | Wt 152.0 lb

## 2021-01-11 DIAGNOSIS — Z23 Encounter for immunization: Secondary | ICD-10-CM | POA: Diagnosis not present

## 2021-01-11 DIAGNOSIS — I10 Essential (primary) hypertension: Secondary | ICD-10-CM | POA: Diagnosis not present

## 2021-01-11 DIAGNOSIS — Z130 Encounter for screening for diseases of the blood and blood-forming organs and certain disorders involving the immune mechanism: Secondary | ICD-10-CM

## 2021-01-11 DIAGNOSIS — Z1329 Encounter for screening for other suspected endocrine disorder: Secondary | ICD-10-CM

## 2021-01-11 DIAGNOSIS — Z13228 Encounter for screening for other metabolic disorders: Secondary | ICD-10-CM | POA: Diagnosis not present

## 2021-01-11 DIAGNOSIS — Z8739 Personal history of other diseases of the musculoskeletal system and connective tissue: Secondary | ICD-10-CM

## 2021-01-11 DIAGNOSIS — Z13220 Encounter for screening for lipoid disorders: Secondary | ICD-10-CM | POA: Diagnosis not present

## 2021-01-11 DIAGNOSIS — Z0000 Encounter for general adult medical examination without abnormal findings: Secondary | ICD-10-CM | POA: Diagnosis not present

## 2021-01-11 DIAGNOSIS — N529 Male erectile dysfunction, unspecified: Secondary | ICD-10-CM

## 2021-01-11 DIAGNOSIS — Z8679 Personal history of other diseases of the circulatory system: Secondary | ICD-10-CM

## 2021-01-11 MED ORDER — TADALAFIL 20 MG PO TABS: 10.0000 mg | ORAL_TABLET | ORAL | 11 refills | Status: DC | PRN

## 2021-01-11 NOTE — Progress Notes (Signed)
John Mcclain 65 y.o.   Chief Complaint  Patient presents with   Annual Exam    HISTORY OF PRESENT ILLNESS: This is a 65 y.o. male here for annual exam. Has history of rheumatoid arthritis, sees rheumatologist on a regular basis.  Presently on Ajovy. History of hypertension on amlodipine 2.5 mg daily and lisinopril 10 mg daily. History of spinal stenosis with neurogenic claudication. History of erectile dysfunction on Cialis. Recent history of gastric ulcers.  Presently on pantoprazole. No complaints or medical concerns today.  HPI   Prior to Admission medications   Medication Sig Start Date End Date Taking? Authorizing Provider  alendronate (FOSAMAX) 70 MG tablet Take 70 mg by mouth once a week. Take with a full glass of water on an empty stomach.   Yes [provider]  amLODipine (NORVASC) 2.5 MG tablet TAKE 1 TABLET EVERY DAY (NEED TO SCHEDULE A 6 MONTH FOLLOW UP APPOINTMENT) 08/24/20  Yes Talayia Hjort, Ines Bloomer, MD  Calcium Carbonate-Vitamin D (CALCIUM 600+D PO) Take by mouth daily.   Yes [provider]  clobetasol (OLUX) 0.05 % topical foam Apply topically 2 (two) times daily. 12/07/20  Yes Lavonna Monarch, MD  cyclobenzaprine (FLEXERIL) 10 MG tablet Take 10 mg by mouth 3 (three) times daily as needed.   Yes [provider]  folic acid (FOLVITE) 1 MG tablet Take 1 mg by mouth daily.   Yes [provider]  Fremanezumab-vfrm (AJOVY) 225 MG/1.5ML SOAJ Inject 1.5 mLs into the skin every 30 (thirty) days.   Yes [provider]  hydrocortisone (ANUSOL-HC) 2.5 % rectal cream Place 1 application rectally 2 (two) times daily. 07/13/20  Yes Horald Pollen, MD  levETIRAcetam (KEPPRA) 750 MG tablet Take 750 mg by mouth 2 (two) times daily.    Yes [provider]  lisinopril (ZESTRIL) 10 MG tablet TAKE 1 TABLET EVERY DAY 09/09/20  Yes Jaquila Santelli, Ines Bloomer, MD  LORazepam (ATIVAN) 1 MG tablet TAKE 1/2 TO 1 TABLET EVERY DAY AS NEEDED FOR  ANXIETY. USE AS SPARINGLY AS POSSIBLE 12/14/20  Yes Etrulia Zarr, Ines Bloomer, MD  Magnesium 400 MG TABS Take 1 tablet by mouth daily as needed.   Yes [provider]  minocycline (DYNACIN) 50 MG tablet Take 50 mg by mouth 2 (two) times daily.   Yes [provider]  naproxen sodium (ALEVE) 220 MG tablet    Yes [provider]  pantoprazole (PROTONIX) 40 MG tablet Take 1 tablet (40 mg total) by mouth 2 (two) times daily. Protonix 40 mg twice daily for 8 weeks, then reduce to 40 mg daily. 10/26/20  Yes Cirigliano, Vito V, DO  polyethylene glycol powder (GLYCOLAX/MIRALAX) powder Take 17 g by mouth daily. 05/24/17  Yes Tereasa Coop, PA-C  predniSONE (DELTASONE) 10 MG tablet Take 10 mg by mouth daily as needed.   Yes [provider]  rosuvastatin (CRESTOR) 5 MG tablet TAKE 1 TABLET EVERY DAY 12/14/20  Yes Ayyan Sites, Ines Bloomer, MD  Upadacitinib ER 15 MG TB24 Take by mouth daily.   Yes [provider]  tadalafil (CIALIS) 5 MG tablet Take 1 tablet (5 mg total) by mouth daily as needed for erectile dysfunction. 09/15/20 12/14/20  Horald Pollen, MD    Allergies  Allergen Reactions   Penicillins     Patient Active Problem List   Diagnosis Date Noted   Lumbar radiculopathy 05/03/2020   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   Spondylosis without myelopathy or radiculopathy, lumbar region 05/03/2020  Hypogonadism male 12/26/2011   Vitamin D deficiency 12/26/2011   Rheumatoid arthritis ([place]) 12/26/2011   Migraine headache disorder 12/26/2011   Anxiety disorder 12/26/2011   HTN (hypertension), benign 12/26/2011    Past Medical History:  Diagnosis Date   Anxiety    Arthritis    Chronic migraine    Depression    Diabetes mellitus without complication (HCC)    Heart murmur    Hypertension    Kidney stones    Raynaud's disease     Past Surgical History:  Procedure Laterality Date   COLON SURGERY     LYMPH  NODE DISSECTION     removed lymph node   VASECTOMY      Social History   Socioeconomic History   Marital status: Married    Spouse name: Not on file   Number of children: Not on file   Years of education: Not on file   Highest education level: Not on file  Occupational History   Not on file  Tobacco Use   Smoking status: Never   Smokeless tobacco: Never  Vaping Use   Vaping Use: Never used  Substance and Sexual Activity   Alcohol use: Yes    Comment: 2-3 per week   Drug use: No   Sexual activity: Yes    Partners: Female  Other Topics Concern   Not on file  Social History Narrative   Married. Education: [employer].    Social Determinants of Health   Financial Resource Strain: Not on file  Food Insecurity: Not on file  Transportation Needs: Not on file  Physical Activity: Not on file  Stress: Not on file  Social Connections: Not on file  Intimate Partner Violence: Not on file    Family History  Problem Relation Age of Onset   Kidney disease Mother    Hypertension Mother    Arthritis Mother        RA   COPD Mother    Hyperlipidemia Mother    Heart attack Father    Diabetes Father    COPD Father    Hypertension Father    Migraines Brother    Stroke Maternal Grandfather    Alzheimer's disease Paternal Grandmother    Colon cancer Neg Hx    Pancreatic cancer Neg Hx    Esophageal cancer Neg Hx      Review of Systems  Constitutional: Negative.  Negative for chills and fever.  HENT: Negative.  Negative for congestion and sore throat.   Respiratory: Negative.  Negative for cough and shortness of breath.   Cardiovascular: Negative.  Negative for chest pain and palpitations.  Gastrointestinal:  Negative for abdominal pain, blood in stool, diarrhea, melena, nausea and vomiting.  Genitourinary: Negative.   Musculoskeletal:  Positive for back pain and joint pain.  Skin: Negative.  Negative for rash.  Neurological: Negative.  Negative for dizziness and headaches.   All other systems reviewed and are negative.   Physical Exam Vitals reviewed.  Constitutional:      Appearance: Normal appearance.  HENT:     Head: Normocephalic.     Right Ear: Tympanic membrane, ear canal and external ear normal.     Left Ear: Tympanic membrane, ear canal and external ear normal.  Eyes:     Extraocular Movements: Extraocular movements intact.     Conjunctiva/sclera: Conjunctivae normal.     Pupils: Pupils are equal, round, and reactive to light.  Neck:     Vascular: No carotid bruit.  Cardiovascular:  Rate and Rhythm: Normal rate and regular rhythm.     Pulses: Normal pulses.     Heart sounds: Normal heart sounds.  Pulmonary:     Effort: Pulmonary effort is normal.     Breath sounds: Normal breath sounds.  Abdominal:     General: Bowel sounds are normal. There is no distension.     Palpations: Abdomen is soft. There is no mass.     Tenderness: There is no abdominal tenderness.     Hernia: A hernia (Small umbilical hernia) is present.  Musculoskeletal:     Cervical back: Normal range of motion and neck supple. No tenderness.     Right lower leg: No edema.     Left lower leg: No edema.  Lymphadenopathy:     Cervical: No cervical adenopathy.  Skin:    General: Skin is warm and dry.     Capillary Refill: Capillary refill takes less than 2 seconds.  Neurological:     General: No focal deficit present.     Mental Status: He is alert and oriented to person, place, and time.  Psychiatric:        Mood and Affect: Mood normal.        Behavior: Behavior normal.     ASSESSMENT & PLAN: John Mcclain was seen today for annual exam.  Diagnoses and all orders for this visit:  Routine general medical examination at a health care facility  Screening for deficiency anemia -     CBC with Differential  Screening for lipoid disorders -     Lipid panel  Screening for endocrine, metabolic and immunity disorder -     Comprehensive metabolic panel -     Hemoglobin  A1c -     PSA(Must document that pt has been informed of limitations of PSA testing.)  Vaccine for diphtheria-tetanus-pertussis, combined -     Tdap vaccine greater than or equal to 7yo IM  History of rheumatoid arthritis  History of hypertension  History of spinal stenosis  Other orders -     tadalafil (CIALIS) 20 MG tablet; Take 0.5-1 tablets (10-20 mg total) by mouth every other day as needed for erectile dysfunction.  Modifiable risk factors discussed with patient. Anticipatory guidance according to age provided. The following topics were also discussed: Social Determinants of Health Smoking Diet and nutrition Benefits of exercise Cancer screening and review of most recent upper and lower endoscopies Vaccinations recommendations Cardiovascular risk assessment Mental health including depression and anxiety Fall and accident prevention  Patient Instructions  Health Maintenance, Male Adopting a healthy lifestyle and getting preventive care are important in promoting health and wellness. Ask your health care provider about: The right schedule for you to have regular tests and exams. Things you can do on your own to prevent diseases and keep yourself healthy. What should I know about diet, weight, and exercise? Eat a healthy diet  Eat a diet that includes plenty of vegetables, fruits, low-fat dairy products, and lean protein. Do not eat a lot of foods that are high in solid fats, added sugars, or sodium.  Maintain a healthy weight Body mass index (BMI) is a measurement that can be used to identify possible weight problems. It estimates body fat based on height and weight. Your health care provider can help determine your BMI and help you achieve or maintain ahealthy weight. Get regular exercise Get regular exercise. This is one of the most important things you can do for your health. Most adults should: Exercise for  at least 150 minutes each week. The exercise should  increase your heart rate and make you sweat (moderate-intensity exercise). Do strengthening exercises at least twice a week. This is in addition to the moderate-intensity exercise. Spend less time sitting. Even light physical activity can be beneficial. Watch cholesterol and blood lipids Have your blood tested for lipids and cholesterol at 67 years of age, then havethis test every 5 years. You may need to have your cholesterol levels checked more often if: Your lipid or cholesterol levels are high. You are older than 67 years of age. You are at high risk for heart disease. What should I know about cancer screening? Many types of cancers can be detected early and may often be prevented. Depending on your health history and family history, you may need to have cancer screening at various ages. This may include screening for: Colorectal cancer. Prostate cancer. Skin cancer. Lung cancer. What should I know about heart disease, diabetes, and high blood pressure? Blood pressure and heart disease High blood pressure causes heart disease and increases the risk of stroke. This is more likely to develop in people who have high blood pressure readings, are of African descent, or are overweight. Talk with your health care provider about your target blood pressure readings. Have your blood pressure checked: Every 3-5 years if you are 90-73 years of age. Every year if you are 21 years old or older. If you are between the ages of 52 and 26 and are a current or former smoker, ask your health care provider if you should have a one-time screening for abdominal aortic aneurysm (AAA). Diabetes Have regular diabetes screenings. This checks your fasting blood sugar level. Have the screening done: Once every three years after age 60 if you are at a normal weight and have a low risk for diabetes. More often and at a younger age if you are overweight or have a high risk for diabetes. What should I know about  preventing infection? Hepatitis B If you have a higher risk for hepatitis B, you should be screened for this virus. Talk with your health care provider to find out if you are at risk forhepatitis B infection. Hepatitis C Blood testing is recommended for: Everyone born from [birth year] through 1965. Anyone with known risk factors for hepatitis C. Sexually transmitted infections (STIs) You should be screened each year for STIs, including gonorrhea and chlamydia, if: You are sexually active and are younger than 67 years of age. You are older than 67 years of age and your health care provider tells you that you are at risk for this type of infection. Your sexual activity has changed since you were last screened, and you are at increased risk for chlamydia or gonorrhea. Ask your health care provider if you are at risk. Ask your health care provider about whether you are at high risk for HIV. Your health care provider may recommend a prescription medicine to help prevent HIV infection. If you choose to take medicine to prevent HIV, you should first get tested for HIV. You should then be tested every 3 months for as long as you are taking the medicine. Follow these instructions at home: Lifestyle Do not use any products that contain nicotine or tobacco, such as cigarettes, e-cigarettes, and chewing tobacco. If you need help quitting, ask your health care provider. Do not use street drugs. Do not share needles. Ask your health care provider for help if you need support or information about quitting drugs.  Alcohol use Do not drink alcohol if your health care provider tells you not to drink. If you drink alcohol: Limit how much you have to 0-2 drinks a day. Be aware of how much alcohol is in your drink. In the [place] one drink equals one 12 oz bottle of beer (355 mL), one 5 oz glass of wine (148 mL), or one 1 oz glass of hard liquor (44 mL). General instructions Schedule regular health, dental, and eye  exams. Stay current with your vaccines. Tell your health care provider if: You often feel depressed. You have ever been abused or do not feel safe at home. Summary Adopting a healthy lifestyle and getting preventive care are important in promoting health and wellness. Follow your health care provider's instructions about healthy diet, exercising, and getting tested or screened for diseases. Follow your health care provider's instructions on monitoring your cholesterol and blood pressure. This information is not intended to replace advice given to you by your health care provider. Make sure you discuss any questions you have with your healthcare provider. Document Revised: 05/15/2018 Document Reviewed: 05/15/2018 Elsevier Patient Education  2022 [place], [place] [facility]

## 2021-01-11 NOTE — Telephone Encounter (Signed)
New prescription for Cialis sent to pharmacy of record.

## 2021-01-11 NOTE — Telephone Encounter (Signed)
Pt was in office today, and states he need a refill for tadafil and would like to know if he can take a higher dosage.

## 2021-01-11 NOTE — Patient Instructions (Signed)
Health Maintenance, Male Adopting a healthy lifestyle and getting preventive care are important in promoting health and wellness. Ask your health care provider about: The right schedule for you to have regular tests and exams. Things you can do on your own to prevent diseases and keep yourself healthy. What should I know about diet, weight, and exercise? Eat a healthy diet  Eat a diet that includes plenty of vegetables, fruits, low-fat dairy products, and lean protein. Do not eat a lot of foods that are high in solid fats, added sugars, or sodium.  Maintain a healthy weight Body mass index (BMI) is a measurement that can be used to identify possible weight problems. It estimates body fat based on height and weight. Your health care provider can help determine your BMI and help you achieve or maintain ahealthy weight. Get regular exercise Get regular exercise. This is one of the most important things you can do for your health. Most adults should: Exercise for at least 150 minutes each week. The exercise should increase your heart rate and make you sweat (moderate-intensity exercise). Do strengthening exercises at least twice a week. This is in addition to the moderate-intensity exercise. Spend less time sitting. Even light physical activity can be beneficial. Watch cholesterol and blood lipids Have your blood tested for lipids and cholesterol at 65 years of age, then havethis test every 5 years. You may need to have your cholesterol levels checked more often if: Your lipid or cholesterol levels are high. You are older than 67 years of age. You are at high risk for heart disease. What should I know about cancer screening? Many types of cancers can be detected early and may often be prevented. Depending on your health history and family history, you may need to have cancer screening at various ages. This may include screening for: Colorectal cancer. Prostate cancer. Skin cancer. Lung  cancer. What should I know about heart disease, diabetes, and high blood pressure? Blood pressure and heart disease High blood pressure causes heart disease and increases the risk of stroke. This is more likely to develop in people who have high blood pressure readings, are of African descent, or are overweight. Talk with your health care provider about your target blood pressure readings. Have your blood pressure checked: Every 3-5 years if you are 18-39 years of age. Every year if you are 40 years old or older. If you are between the ages of 65 and 75 and are a current or former smoker, ask your health care provider if you should have a one-time screening for abdominal aortic aneurysm (AAA). Diabetes Have regular diabetes screenings. This checks your fasting blood sugar level. Have the screening done: Once every three years after age 45 if you are at a normal weight and have a low risk for diabetes. More often and at a younger age if you are overweight or have a high risk for diabetes. What should I know about preventing infection? Hepatitis B If you have a higher risk for hepatitis B, you should be screened for this virus. Talk with your health care provider to find out if you are at risk forhepatitis B infection. Hepatitis C Blood testing is recommended for: Everyone born from 1945 through 1965. Anyone with known risk factors for hepatitis C. Sexually transmitted infections (STIs) You should be screened each year for STIs, including gonorrhea and chlamydia, if: You are sexually active and are younger than 67 years of age. You are older than 67 years of age   and your health care provider tells you that you are at risk for this type of infection. Your sexual activity has changed since you were last screened, and you are at increased risk for chlamydia or gonorrhea. Ask your health care provider if you are at risk. Ask your health care provider about whether you are at high risk for HIV.  Your health care provider may recommend a prescription medicine to help prevent HIV infection. If you choose to take medicine to prevent HIV, you should first get tested for HIV. You should then be tested every 3 months for as long as you are taking the medicine. Follow these instructions at home: Lifestyle Do not use any products that contain nicotine or tobacco, such as cigarettes, e-cigarettes, and chewing tobacco. If you need help quitting, ask your health care provider. Do not use street drugs. Do not share needles. Ask your health care provider for help if you need support or information about quitting drugs. Alcohol use Do not drink alcohol if your health care provider tells you not to drink. If you drink alcohol: Limit how much you have to 0-2 drinks a day. Be aware of how much alcohol is in your drink. In the [place] one drink equals one 12 oz bottle of beer (355 mL), one 5 oz glass of wine (148 mL), or one 1 oz glass of hard liquor (44 mL). General instructions Schedule regular health, dental, and eye exams. Stay current with your vaccines. Tell your health care provider if: You often feel depressed. You have ever been abused or do not feel safe at home. Summary Adopting a healthy lifestyle and getting preventive care are important in promoting health and wellness. Follow your health care provider's instructions about healthy diet, exercising, and getting tested or screened for diseases. Follow your health care provider's instructions on monitoring your cholesterol and blood pressure. This information is not intended to replace advice given to you by your health care provider. Make sure you discuss any questions you have with your healthcare provider. Document Revised: 05/15/2018 Document Reviewed: 05/15/2018 Elsevier Patient Education  2022 [employer]vier Inc[employer]  

## 2021-01-12 LAB — CBC WITH DIFFERENTIAL/PLATELET
Basophils Absolute: 0 10*3/uL (ref 0.0–0.1)
Basophils Relative: 0.3 % (ref 0.0–3.0)
Eosinophils Absolute: 0 10*3/uL (ref 0.0–0.7)
Eosinophils Relative: 0.5 % (ref 0.0–5.0)
HCT: 42.8 % (ref 39.0–52.0)
Hemoglobin: 14.2 g/dL (ref 13.0–17.0)
Lymphocytes Relative: 16.3 % (ref 12.0–46.0)
Lymphs Abs: 0.9 10*3/uL (ref 0.7–4.0)
MCHC: 33.2 g/dL (ref 30.0–36.0)
MCV: 102.2 fl — ABNORMAL HIGH (ref 78.0–100.0)
Monocytes Absolute: 0.8 10*3/uL (ref 0.1–1.0)
Monocytes Relative: 13.7 % — ABNORMAL HIGH (ref 3.0–12.0)
Neutro Abs: 3.9 10*3/uL (ref 1.4–7.7)
Neutrophils Relative %: 69.2 % (ref 43.0–77.0)
Platelets: 292 10*3/uL (ref 150.0–400.0)
RBC: 4.19 Mil/uL — ABNORMAL LOW (ref 4.22–5.81)
RDW: 15 % (ref 11.5–15.5)
WBC: 5.7 10*3/uL (ref 4.0–10.5)

## 2021-01-12 LAB — COMPREHENSIVE METABOLIC PANEL
ALT: 18 U/L (ref 0–53)
AST: 24 U/L (ref 0–37)
Albumin: 4.3 g/dL (ref 3.5–5.2)
Alkaline Phosphatase: 26 U/L — ABNORMAL LOW (ref 39–117)
BUN: 17 mg/dL (ref 6–23)
CO2: 27 mEq/L (ref 19–32)
Calcium: 9.1 mg/dL (ref 8.4–10.5)
Chloride: 104 mEq/L (ref 96–112)
Creatinine, Ser: 1.12 mg/dL (ref 0.40–1.50)
GFR: 67.98 mL/min (ref >60.00)
Glucose, Bld: 96 mg/dL (ref 70–99)
Potassium: 4.3 mEq/L (ref 3.5–5.1)
Sodium: 139 mEq/L (ref 135–145)
Total Bilirubin: 0.5 mg/dL (ref 0.2–1.2)
Total Protein: 6.9 g/dL (ref 6.0–8.3)

## 2021-01-12 LAB — PSA: PSA: 1.06 ng/mL (ref 0.10–4.00)

## 2021-01-12 LAB — LIPID PANEL
Cholesterol: 194 mg/dL (ref 0–200)
HDL: 84.9 mg/dL (ref >39.00)
LDL Cholesterol: 70 mg/dL (ref 0–99)
NonHDL: 109.45
Total CHOL/HDL Ratio: 2
Triglycerides: 195 mg/dL — ABNORMAL HIGH (ref 0.0–149.0)
VLDL: 39 mg/dL (ref 0.0–40.0)

## 2021-01-12 LAB — HEMOGLOBIN A1C: Hgb A1c MFr Bld: 6.1 % (ref 4.6–6.5)

## 2021-01-17 ENCOUNTER — Telehealth: Payer: Self-pay | Admitting: Dermatology

## 2021-01-17 NOTE — Telephone Encounter (Signed)
Patient left message on office voice mail that he was calling for culture results from last visit with Stuart Tafeen, MD. 

## 2021-01-18 NOTE — Telephone Encounter (Signed)
Patient called back regarding the dosage and frequency of this medication. He stated during his last visit they had agreed to up the frequency to twice a day instead of PRN and keeping the dosage at 5 mg. Requesting a new order to put in with updated instructions to pharmacy listed below.   Please advise.   Preferred pharmacy:  [facility] [id number] [place], [place] Phone:  [phone]  Fax:  [phone]

## 2021-01-19 ENCOUNTER — Telehealth: Payer: Self-pay

## 2021-01-19 NOTE — Telephone Encounter (Signed)
error 

## 2021-01-20 MED ORDER — TERBINAFINE HCL 250 MG PO TABS: 250.0000 mg | ORAL_TABLET | Freq: Every day | ORAL | 1 refills | Status: DC

## 2021-01-20 NOTE — Addendum Note (Signed)
Addended by: Sheran Lawless on: 01/20/2021 04:40 PM   Modules accepted: Orders

## 2021-01-29 ENCOUNTER — Other Ambulatory Visit: Payer: Self-pay | Admitting: Emergency Medicine

## 2021-01-29 NOTE — Telephone Encounter (Signed)
Please call patient and verify dose and frequency.  I did send prescription recently for a higher dose once a day Cialis.  Cialis is to be taken once a day only.  Cannot prescribe twice a day.  Please clarify.  Thanks.

## 2021-02-03 NOTE — Telephone Encounter (Signed)
Called and spoke with pt. He states the medication was too expensive going through the mail order pharmacy. Pt canceled that new prescription for Cilias for 20 mg. Pt request that he would like to go back to original dosage of 5 mg with a 90 day supply. Sent to [facility] on [address]. Advised pt on how medication should be taken.

## 2021-02-03 NOTE — Telephone Encounter (Signed)
Thank you :)

## 2021-02-03 NOTE — Telephone Encounter (Signed)
Follow up message  Patient either wants '5mg'$  once a day/ 90 day supply or a longer than 10 day supply of the '20mg'$   Pharmacy  [facility]

## 2021-02-04 ENCOUNTER — Other Ambulatory Visit: Payer: Self-pay

## 2021-02-04 DIAGNOSIS — N529 Male erectile dysfunction, unspecified: Secondary | ICD-10-CM

## 2021-02-04 MED ORDER — TADALAFIL 5 MG PO TABS: 5.0000 mg | ORAL_TABLET | Freq: Every day | ORAL | 2 refills | Status: DC | PRN

## 2021-03-14 ENCOUNTER — Other Ambulatory Visit: Payer: Self-pay | Admitting: Emergency Medicine

## 2021-03-14 ENCOUNTER — Telehealth: Payer: Self-pay | Admitting: Physical Medicine and Rehabilitation

## 2021-03-14 DIAGNOSIS — I10 Essential (primary) hypertension: Secondary | ICD-10-CM

## 2021-03-14 NOTE — Telephone Encounter (Signed)
Pt called and states that's his injection is wearing off and he was wondering if he could get another one or what's the next step? It was Right L4-L5 [place]  CB [phone]

## 2021-03-14 NOTE — Telephone Encounter (Signed)
Right L3 and L4 TF 12/20/20. Ok to repeat if helped, same problem/side, and no new injury?   [employer]

## 2021-03-15 NOTE — Telephone Encounter (Signed)
Patiens states that the pain is on the left side of his low back. No leg pain. Last seen for left sided injection in 2021. [employer]. Scheduled for OV.

## 2021-03-16 ENCOUNTER — Other Ambulatory Visit: Payer: Self-pay

## 2021-03-16 ENCOUNTER — Encounter: Payer: Self-pay | Admitting: Physical Medicine and Rehabilitation

## 2021-03-16 ENCOUNTER — Ambulatory Visit: Payer: Medicare PPO | Admitting: Physical Medicine and Rehabilitation

## 2021-03-16 VITALS — BP 157/88 | HR 75

## 2021-03-16 DIAGNOSIS — M47816 Spondylosis without myelopathy or radiculopathy, lumbar region: Secondary | ICD-10-CM | POA: Diagnosis not present

## 2021-03-16 DIAGNOSIS — M5416 Radiculopathy, lumbar region: Secondary | ICD-10-CM

## 2021-03-16 DIAGNOSIS — M5116 Intervertebral disc disorders with radiculopathy, lumbar region: Secondary | ICD-10-CM

## 2021-03-16 DIAGNOSIS — H52202 Unspecified astigmatism, left eye: Secondary | ICD-10-CM | POA: Diagnosis not present

## 2021-03-16 DIAGNOSIS — H2513 Age-related nuclear cataract, bilateral: Secondary | ICD-10-CM | POA: Diagnosis not present

## 2021-03-16 MED ORDER — HYDROCODONE-ACETAMINOPHEN 5-325 MG PO TABS: 1.0000 | ORAL_TABLET | Freq: Three times a day (TID) | ORAL | 0 refills | Status: AC | PRN

## 2021-03-16 NOTE — Progress Notes (Signed)
Pt state lower back pain. Pt state any movement and getting out of a chair makes the pain worse. Pt state he takes over the counter pain meds to help ease his pain. Pt state the pain doesn't go down the legs anymore. Pt has hx of inj on 12/20/20 pt state it helped.  Numeric Pain Rating Scale and Functional Assessment Average Pain 8 Pain Right Now 4 My pain is intermittent, sharp, stabbing, and aching Pain is worse with: walking, bending, sitting, standing, and some activites Pain improves with: medication   In the last MONTH (on 0-10 scale) has pain interfered with the following?  1. General activity like being  able to carry out your everyday physical activities such as walking, climbing stairs, carrying groceries, or moving a chair?  Rating(7)  2. Relation with others like being able to carry out your usual social activities and roles such as  activities at home, at work and in your community. Rating(8)  3. Enjoyment of life such that you have  been bothered by emotional problems such as feeling anxious, depressed or irritable?  Rating(9)

## 2021-03-16 NOTE — Progress Notes (Signed)
John Mcclain - 65 y.o. male MRN [id number]  Date of birth: 1956-04-13  Office Visit Note: Visit Date: 03/16/2021 PCP: Horald Pollen, MD Referred by: Horald Pollen, *  Subjective: Chief Complaint  Patient presents with   Lower Back - Pain   HPI: John Mcclain is a 65 y.o. male who comes in today for evaluation of chronic, worsening and severe bilateral lower back pain radiating to hip, left greater than right. Patient had right L3 and L4 transforaminal epidural steroid injection on 12/20/2020, which he reports completely resolved right sided radicular symptoms. Patient reports pain is exacerbated by bending and movement, currently describes as a sharp and sore sensation, rates as 9 out of 10.  Patient reports some relief of pain with stretching exercises, heat and use of Tylenol as needed.  He also reports that he takes Hydrocodone intermittently when his pain becomes severe.  Patient's lumbar MRI exhibits multilevel facet degenerative changes, multilevel spinal canal stenosis most severe at L3-L4 and multilevel foraminal narrowing, most severe on the left at L2-L3.  Patient did attend formal physical therapy at [facility] in 2021 which he reports did help to alleviate his pain.  Patient states he is a very active person and does perform stretching and strengthening exercises that he learned in physical therapy on a daily basis at home.  Patient states he has noticed that his pain is becoming more severe and is having a difficult time performing exercises and daily tasks without having to take frequent breaks.  Patient is requesting a refill of his Hydrocodone today.  Patient denies focal weakness, numbness and tingling.  Patient denies recent trauma or falls.  Review of Systems  Musculoskeletal:  Positive for back pain.  Neurological:  Negative for tingling, sensory change, focal weakness and weakness.  All other systems reviewed and are negative. Otherwise per  HPI.  Assessment & Plan: Visit Diagnoses:    ICD-10-CM   1. Lumbar radiculopathy  M54.16     2. Radiculopathy due to lumbar intervertebral disc disorder  M51.16     3. Spondylosis without myelopathy or radiculopathy, lumbar region  M47.816 Ambulatory referral to Physical Medicine Rehab    4. Facet hypertrophy of lumbar region  M47.816        Plan: Findings:  Chronic, worsening and severe bilateral lower back pain radiating to hips, left greater than right.  Right-sided radicular symptoms have completely resolved since right L3 and L4 transforaminal epidural steroid injection in July.  Patient's clinical presentation and exam is consistent with multifactorial stenosis and facet arthropathy. Patient continues to have excruciating bilateral lower back pain radiating to the hips despite good conservative therapy such as formal physical therapy, stretching exercises at home and use of medications.  We believe the next step would be to perform a diagnostic and hopefully therapeutic bilateral L3 transforaminal epidural steroid injection under fluoroscopic guidance.  If patient gets good relief from epidural steroid injection we will continue to monitor, however if his pain persists we would consider performing facet joint injections with the possibility of radiofrequency ablation.  May need to consider surgical referral and consultation.   Short-term prescription written today for Hydrocodone by mouth every 8 hours as needed to help manage moderate to severe pain. No red flag symptoms noted upon exam. We also discussed in detail long term pain management with opioids. We do not feel that management of the patients chronic pain with long term use of opioids is efficacious or a safe option due to  the highly addictive nature of these medications. We also feel that long term use of opioids can negatively impact other co-morbid conditions .  We are happy to offer patient other adjunctive therapies such as  non-narcotic medications and physical therapy. We also discussed referral to pain management specialist if she wishes to continue narcotic pain medications. Patient verbalizes understanding and has no questions at this time.    Meds & Orders:  Meds ordered this encounter  Medications   HYDROcodone-acetaminophen (NORCO/VICODIN) 5-325 MG tablet    Sig: Take 1 tablet by mouth every 8 (eight) hours as needed for up to 5 days for moderate pain or severe pain.    Dispense:  15 tablet    Refill:  0    Order Specific Question:   Supervising Provider    Answer:   Magnus Sinning [[id number]]    Orders Placed This Encounter  Procedures   Ambulatory referral to Physical Medicine Rehab    Follow-up: Return in about 1 week (around 03/23/2021) for Bilateral L3 transforaminal epidural steroid injection.   Procedures: No procedures performed      Clinical History: MRI LUMBAR SPINE WITHOUT CONTRAST   TECHNIQUE: Multiplanar, multisequence MR imaging of the lumbar spine was performed. No intravenous contrast was administered.   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 Careful correlation with this numbering strategy prior to any procedural intervention would be recommended.   Alignment: Dextroconvex scoliosis of the lumbar spine. Grade 1 anterolisthesis of L4 over L5, degenerative. Small retrolisthesis from T12 through L3.   Vertebrae: No acute fracture, evidence of discitis, or bone lesion. Mild chronic compression fracture of L4 with approximately 30% loss of vertebral body height. Endplate degenerative changes at T12-L1, L3-4 and L4-5.   Conus medullaris and cauda equina: Conus extends to the T12-L1 level. Conus and cauda equina appear normal.   Paraspinal and other soft tissues: Negative.   Disc levels:   T12-L1: Disc bulge with associated osteophytic component and  facet degenerative changes resulting in narrowing of the bilateral subarticular zones, mild-to-moderate right and moderate left neural foraminal narrowing.   L1-2: Disc bulge, facet degenerative changes and ligamentum flavum redundancy resulting in mild spinal canal stenosis with effacement of the bilateral subarticular zones, mild right and moderate left neural foraminal narrowing.   L2-3: Left asymmetric disc bulge, facet degenerative changes and ligamentum flavum redundancy resulting in moderate spinal canal stenosis with narrowing of the bilateral subarticular zones, moderate right and severe left neural foraminal narrowing.   L3-4: Right asymmetric disc bulge with superimposed central disc extrusion migrating inferiorly, facet degenerative changes and ligamentum flavum redundancy resulting in moderate to severe spinal canal stenosis and severe bilateral neural foraminal.   L4-5: Disc bulge, prominent hypertrophic facet degenerative change ligamentum flavum redundancy resulting in moderate spinal canal stenosis with narrowing of the right subarticular zone and severe right neural foraminal narrowing.   L5-S1: No spinal canal or neural foraminal stenosis.   IMPRESSION: 1. 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 Careful correlation with this numbering strategy prior to any procedural intervention would be recommended. 2. Multilevel degenerative changes of the lumbar spine with moderate to severe spinal canal stenosis at L3-4 and moderate spinal canal stenosis at L2-3 and L4-5. 3. Multilevel neural foraminal narrowing, severe on the left at L2-3, bilaterally at L3-4 and on the right at L4-5. 4. Mild chronic compression fracture of L4  with approximately 30% loss of vertebral body height.     Electronically Signed   By: Pedro Earls M.D.   On: 03/26/2020 14:46   He reports that he has never smoked. He has never  used smokeless tobacco.  Recent Labs    07/13/20 1613 01/11/21 1632  HGBA1C 5.8* 6.1    Objective:  VS:  HT:    WT:   BMI:     BP: (!) 157/88  HR:75bpm  TEMP: ( )  RESP:  Physical Exam Vitals and nursing note reviewed.  HENT:     Head: Normocephalic and atraumatic.     Right Ear: External ear normal.     Left Ear: External ear normal.     Nose: Nose normal.     Mouth/Throat:     Mouth: Mucous membranes are moist.  Eyes:     Pupils: Pupils are equal, round, and reactive to light.  Cardiovascular:     Rate and Rhythm: Normal rate.     Pulses: Normal pulses.  Pulmonary:     Effort: Pulmonary effort is normal.  Abdominal:     General: Abdomen is flat. There is no distension.  Musculoskeletal:        General: Tenderness present.     Cervical back: Normal range of motion.     Comments: Pt is slow to rise from seated position to standing. Pain noted upon facet loading. Strong distal strength without clonus, no pain upon palpation of greater trochanters. Sensation intact bilaterally. Dysesthesias noted to L2 and L3 dermatomes bilaterally. Walks independently, gait steady.     Skin:    General: Skin is warm and dry.     Capillary Refill: Capillary refill takes less than 2 seconds.  Neurological:     General: No focal deficit present.     Mental Status: He is alert and oriented to person, place, and time.  Psychiatric:        Mood and Affect: Mood normal.    Ortho Exam  Imaging: No results found.  Past Medical/Family/Surgical/Social History: Medications & Allergies reviewed per EMR, new medications updated. Patient Active Problem List   Diagnosis Date Noted   Lumbar radiculopathy 05/03/2020   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   Spondylosis without myelopathy or radiculopathy, lumbar region 05/03/2020   Hypogonadism male 12/26/2011   Vitamin D deficiency 12/26/2011   Rheumatoid arthritis ([place]) 12/26/2011   Migraine headache  disorder 12/26/2011   Anxiety disorder 12/26/2011   HTN (hypertension), benign 12/26/2011   Past Medical History:  Diagnosis Date   Anxiety    Arthritis    Chronic migraine    Depression    Diabetes mellitus without complication ([place])    Heart murmur    Hypertension    Kidney stones    Raynaud's disease    Family History  Problem Relation Age of Onset   Kidney disease Mother    Hypertension Mother    Arthritis Mother        RA   COPD Mother    Hyperlipidemia Mother    Heart attack Father    Diabetes Father    COPD Father    Hypertension Father    Migraines Brother    Stroke Maternal Grandfather    Alzheimer's disease Paternal Grandmother    Colon cancer Neg Hx    Pancreatic cancer Neg Hx    Esophageal cancer Neg Hx    Past Surgical History:  Procedure Laterality Date   COLON  SURGERY     LYMPH NODE DISSECTION     removed lymph node   VASECTOMY     Social History   Occupational History   Not on file  Tobacco Use   Smoking status: Never   Smokeless tobacco: Never  Vaping Use   Vaping Use: Never used  Substance and Sexual Activity   Alcohol use: Yes    Comment: 2-3 per week   Drug use: No   Sexual activity: Yes    Partners: Female

## 2021-03-29 ENCOUNTER — Encounter: Payer: Self-pay | Admitting: Physical Medicine and Rehabilitation

## 2021-03-31 ENCOUNTER — Other Ambulatory Visit: Payer: Self-pay

## 2021-03-31 ENCOUNTER — Encounter: Payer: Self-pay | Admitting: Physical Medicine and Rehabilitation

## 2021-03-31 ENCOUNTER — Ambulatory Visit (INDEPENDENT_AMBULATORY_CARE_PROVIDER_SITE_OTHER): Payer: Medicare PPO | Admitting: Physical Medicine and Rehabilitation

## 2021-03-31 ENCOUNTER — Ambulatory Visit: Payer: Self-pay

## 2021-03-31 VITALS — BP 154/91 | HR 80

## 2021-03-31 DIAGNOSIS — M5416 Radiculopathy, lumbar region: Secondary | ICD-10-CM | POA: Diagnosis not present

## 2021-03-31 DIAGNOSIS — M48062 Spinal stenosis, lumbar region with neurogenic claudication: Secondary | ICD-10-CM | POA: Diagnosis not present

## 2021-03-31 MED ORDER — METHYLPREDNISOLONE ACETATE 80 MG/ML IJ SUSP
80.0000 mg | Freq: Once | INTRAMUSCULAR | Status: AC
  Administered 2021-03-31: 80 mg

## 2021-03-31 NOTE — Progress Notes (Signed)
John Mcclain - 65 y.o. male MRN [id number]  Date of birth: 09/27/1955  Office Visit Note: Visit Date: 03/31/2021 PCP: Horald Pollen, MD Referred by: Horald Pollen, *  Subjective: Chief Complaint  Patient presents with   Lower Back - Pain   HPI:  John Mcclain is a 65 y.o. male who comes in today at the request of Barnet Pall, FNP for planned Bilateral L3-4 Lumbar Transforaminal epidural steroid injection with fluoroscopic guidance.  The patient has failed conservative care including home exercise, medications, time and activity modification.  This injection will be diagnostic and hopefully therapeutic.  Please see requesting physician notes for further details and justification.   ROS Otherwise per HPI.  Assessment & Plan: Visit Diagnoses:    ICD-10-CM   1. Lumbar radiculopathy  M54.16 XR C-ARM NO REPORT    Epidural Steroid injection    methylPREDNISolone acetate (DEPO-MEDROL) injection 80 mg    2. Spinal stenosis of lumbar region with neurogenic claudication  M48.062 XR C-ARM NO REPORT    Epidural Steroid injection    methylPREDNISolone acetate (DEPO-MEDROL) injection 80 mg      Plan: No additional findings.   Meds & Orders:  Meds ordered this encounter  Medications   methylPREDNISolone acetate (DEPO-MEDROL) injection 80 mg    Orders Placed This Encounter  Procedures   XR C-ARM NO REPORT   Epidural Steroid injection    Follow-up: Return if symptoms worsen or fail to improve.   Procedures: No procedures performed  Lumbosacral Transforaminal Epidural Steroid Injection - Sub-Pedicular Approach with Fluoroscopic Guidance  Patient: John Mcclain      Date of Birth: March 11, 1956 MRN: [id number] PCP: Horald Pollen, MD      Visit Date: 03/31/2021   Universal Protocol:    Date/Time: 03/31/2021  Consent Given By: the patient  Position: PRONE  Additional Comments: Vital signs were monitored before and after the procedure. Patient was prepped  and draped in the usual sterile fashion. The correct patient, procedure, and site was verified.   Injection Procedure Details:   Procedure diagnoses: Lumbar radiculopathy [M54.16]    Meds Administered:  Meds ordered this encounter  Medications   methylPREDNISolone acetate (DEPO-MEDROL) injection 80 mg    Laterality: Bilateral  Location/Site: L3, sacralized L5 level  Needle:5.0 in., 22 ga.  Short bevel or Quincke spinal needle  Needle Placement: Transforaminal  Findings:    -Comments: Excellent flow of contrast along the nerve, nerve root and into the epidural space.  Procedure Details: After squaring off the end-plates to get a true AP view, the C-arm was positioned so that an oblique view of the foramen as noted above was visualized. The target area is just inferior to the "nose of the scotty dog" or sub pedicular. The soft tissues overlying this structure were infiltrated with 2-3 ml. of 1% Lidocaine without Epinephrine.  The spinal needle was inserted toward the target using a "trajectory" view along the fluoroscope beam.  Under AP and lateral visualization, the needle was advanced so it did not puncture dura and was located close the 6 O'Clock position of the pedical in AP tracterory. Biplanar projections were used to confirm position. Aspiration was confirmed to be negative for CSF and/or blood. A 1-2 ml. volume of Isovue-250 was injected and flow of contrast was noted at each level. Radiographs were obtained for documentation purposes.   After attaining the desired flow of contrast documented above, a 0.5 to 1.0 ml test dose of 0.25% Marcaine was injected into each  respective transforaminal space.  The patient was observed for 90 seconds post injection.  After no sensory deficits were reported, and normal lower extremity motor function was noted,   the above injectate was administered so that equal amounts of the injectate were placed at each foramen (level) into the  transforaminal epidural space.   Additional Comments:  No complications occurred Dressing: 2 x 2 sterile gauze and Band-Aid    Post-procedure details: Patient was observed during the procedure. Post-procedure instructions were reviewed.  Patient left the clinic in stable condition.    Clinical History: MRI LUMBAR SPINE WITHOUT CONTRAST   TECHNIQUE: Multiplanar, multisequence MR imaging of the lumbar spine was performed. No intravenous contrast was administered.   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 Careful correlation with this numbering strategy prior to any procedural intervention would be recommended.   Alignment: Dextroconvex scoliosis of the lumbar spine. Grade 1 anterolisthesis of L4 over L5, degenerative. Small retrolisthesis from T12 through L3.   Vertebrae: No acute fracture, evidence of discitis, or bone lesion. Mild chronic compression fracture of L4 with approximately 30% loss of vertebral body height. Endplate degenerative changes at T12-L1, L3-4 and L4-5.   Conus medullaris and cauda equina: Conus extends to the T12-L1 level. Conus and cauda equina appear normal.   Paraspinal and other soft tissues: Negative.   Disc levels:   T12-L1: Disc bulge with associated osteophytic component and facet degenerative changes resulting in narrowing of the bilateral subarticular zones, mild-to-moderate right and moderate left neural foraminal narrowing.   L1-2: Disc bulge, facet degenerative changes and ligamentum flavum redundancy resulting in mild spinal canal stenosis with effacement of the bilateral subarticular zones, mild right and moderate left neural foraminal narrowing.   L2-3: Left asymmetric disc bulge, facet degenerative changes and ligamentum flavum redundancy resulting in moderate spinal canal stenosis with narrowing of the bilateral  subarticular zones, moderate right and severe left neural foraminal narrowing.   L3-4: Right asymmetric disc bulge with superimposed central disc extrusion migrating inferiorly, facet degenerative changes and ligamentum flavum redundancy resulting in moderate to severe spinal canal stenosis and severe bilateral neural foraminal.   L4-5: Disc bulge, prominent hypertrophic facet degenerative change ligamentum flavum redundancy resulting in moderate spinal canal stenosis with narrowing of the right subarticular zone and severe right neural foraminal narrowing.   L5-S1: No spinal canal or neural foraminal stenosis.   IMPRESSION: 1. 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 Careful correlation with this numbering strategy prior to any procedural intervention would be recommended. 2. Multilevel degenerative changes of the lumbar spine with moderate to severe spinal canal stenosis at L3-4 and moderate spinal canal stenosis at L2-3 and L4-5. 3. Multilevel neural foraminal narrowing, severe on the left at L2-3, bilaterally at L3-4 and on the right at L4-5. 4. Mild chronic compression fracture of L4 with approximately 30% loss of vertebral body height.     Electronically Signed   By: Pedro Earls M.D.   On: 03/26/2020 14:46     Objective:  VS:  HT:    WT:   BMI:     BP:(!) 154/91  HR:80bpm  TEMP: ( )  RESP:  Physical Exam Vitals and nursing note reviewed.  Constitutional:      General: He is not in acute distress.    Appearance: Normal appearance. He is not ill-appearing.  HENT:  Head: Normocephalic and atraumatic.     Right Ear: External ear normal.     Left Ear: External ear normal.     Nose: No congestion.  Eyes:     Extraocular Movements: Extraocular movements intact.  Cardiovascular:     Rate and Rhythm: Normal rate.     Pulses: Normal pulses.  Pulmonary:     Effort: Pulmonary effort is normal. No  respiratory distress.  Abdominal:     General: There is no distension.     Palpations: Abdomen is soft.  Musculoskeletal:        General: No tenderness or signs of injury.     Cervical back: Neck supple.     Right lower leg: No edema.     Left lower leg: No edema.     Comments: Patient has good distal strength without clonus.  Skin:    Findings: No erythema or rash.  Neurological:     General: No focal deficit present.     Mental Status: He is alert and oriented to person, place, and time.     Sensory: No sensory deficit.     Motor: No weakness or abnormal muscle tone.     Coordination: Coordination normal.  Psychiatric:        Mood and Affect: Mood normal.        Behavior: Behavior normal.     Imaging: No results found.

## 2021-03-31 NOTE — Procedures (Signed)
Lumbosacral Transforaminal Epidural Steroid Injection - Sub-Pedicular Approach with Fluoroscopic Guidance  Patient: John Mcclain      Date of Birth: December 19, 1955 MRN: [id number] PCP: Horald Pollen, MD      Visit Date: 03/31/2021   Universal Protocol:    Date/Time: 03/31/2021  Consent Given By: the patient  Position: PRONE  Additional Comments: Vital signs were monitored before and after the procedure. Patient was prepped and draped in the usual sterile fashion. The correct patient, procedure, and site was verified.   Injection Procedure Details:   Procedure diagnoses: Lumbar radiculopathy [M54.16]    Meds Administered:  Meds ordered this encounter  Medications   methylPREDNISolone acetate (DEPO-MEDROL) injection 80 mg    Laterality: Bilateral  Location/Site: L3, sacralized L5 level  Needle:5.0 in., 22 ga.  Short bevel or Quincke spinal needle  Needle Placement: Transforaminal  Findings:    -Comments: Excellent flow of contrast along the nerve, nerve root and into the epidural space.  Procedure Details: After squaring off the end-plates to get a true AP view, the C-arm was positioned so that an oblique view of the foramen as noted above was visualized. The target area is just inferior to the "nose of the scotty dog" or sub pedicular. The soft tissues overlying this structure were infiltrated with 2-3 ml. of 1% Lidocaine without Epinephrine.  The spinal needle was inserted toward the target using a "trajectory" view along the fluoroscope beam.  Under AP and lateral visualization, the needle was advanced so it did not puncture dura and was located close the 6 O'Clock position of the pedical in AP tracterory. Biplanar projections were used to confirm position. Aspiration was confirmed to be negative for CSF and/or blood. A 1-2 ml. volume of Isovue-250 was injected and flow of contrast was noted at each level. Radiographs were obtained for documentation purposes.    After attaining the desired flow of contrast documented above, a 0.5 to 1.0 ml test dose of 0.25% Marcaine was injected into each respective transforaminal space.  The patient was observed for 90 seconds post injection.  After no sensory deficits were reported, and normal lower extremity motor function was noted,   the above injectate was administered so that equal amounts of the injectate were placed at each foramen (level) into the transforaminal epidural space.   Additional Comments:  No complications occurred Dressing: 2 x 2 sterile gauze and Band-Aid    Post-procedure details: Patient was observed during the procedure. Post-procedure instructions were reviewed.  Patient left the clinic in stable condition.

## 2021-03-31 NOTE — Progress Notes (Signed)
Pt state lower back pain. Pt state any movement and getting out of a chair makes the pain worse. Pt state he takes over the counter pain meds to help ease his pain Pt has hx of inj on 12/20/20 pt state  Numeric Pain Rating Scale and Functional Assessment Average Pain 3   In the last MONTH (on 0-10 scale) has pain interfered with the following?  1. General activity like being  able to carry out your everyday physical activities such as walking, climbing stairs, carrying groceries, or moving a chair?  Rating(10)   +Driver, -BT, -Dye Allergies.

## 2021-03-31 NOTE — Patient Instructions (Signed)

## 2021-04-16 ENCOUNTER — Other Ambulatory Visit: Payer: Self-pay | Admitting: Emergency Medicine

## 2021-04-16 DIAGNOSIS — F419 Anxiety disorder, unspecified: Secondary | ICD-10-CM

## 2021-04-21 ENCOUNTER — Telehealth: Payer: Self-pay

## 2021-04-21 NOTE — Telephone Encounter (Signed)
Patient VM unavailable, sent a mychart message patient needs to be seen in office for a routine follow up by 06/03/21.

## 2021-04-25 ENCOUNTER — Telehealth: Payer: Self-pay | Admitting: Physical Medicine and Rehabilitation

## 2021-04-25 NOTE — Telephone Encounter (Signed)
Patient called advised he is having sever pain in his lower back and right hip. Patient said the pain is really bad.  The number to contact patient is [phone]

## 2021-05-17 ENCOUNTER — Other Ambulatory Visit: Payer: Self-pay

## 2021-05-17 ENCOUNTER — Encounter: Payer: Self-pay | Admitting: Physical Medicine and Rehabilitation

## 2021-05-17 ENCOUNTER — Ambulatory Visit (INDEPENDENT_AMBULATORY_CARE_PROVIDER_SITE_OTHER): Payer: Medicare PPO | Admitting: Physical Medicine and Rehabilitation

## 2021-05-17 ENCOUNTER — Ambulatory Visit: Payer: Self-pay

## 2021-05-17 VITALS — BP 159/77 | HR 97

## 2021-05-17 DIAGNOSIS — M5416 Radiculopathy, lumbar region: Secondary | ICD-10-CM

## 2021-05-17 DIAGNOSIS — M48062 Spinal stenosis, lumbar region with neurogenic claudication: Secondary | ICD-10-CM | POA: Diagnosis not present

## 2021-05-17 MED ORDER — METHYLPREDNISOLONE ACETATE 80 MG/ML IJ SUSP
80.0000 mg | Freq: Once | INTRAMUSCULAR | Status: AC
  Administered 2021-05-17: 15:00:00 80 mg

## 2021-05-17 NOTE — Patient Instructions (Signed)

## 2021-05-17 NOTE — Progress Notes (Signed)
Pt state lower back pain. Pt state any movement and getting out of a chair makes the pain worse. Pt state he takes over the counter pain meds to help ease his pain.  Numeric Pain Rating Scale and Functional Assessment Average Pain 2   In the last MONTH (on 0-10 scale) has pain interfered with the following?  1. General activity like being  able to carry out your everyday physical activities such as walking, climbing stairs, carrying groceries, or moving a chair?  Rating(10)   +Driver, -BT, -Dye Allergies.

## 2021-05-19 ENCOUNTER — Other Ambulatory Visit: Payer: Self-pay

## 2021-05-19 ENCOUNTER — Ambulatory Visit (INDEPENDENT_AMBULATORY_CARE_PROVIDER_SITE_OTHER): Payer: Medicare PPO | Admitting: Gastroenterology

## 2021-05-19 ENCOUNTER — Encounter: Payer: Self-pay | Admitting: Gastroenterology

## 2021-05-19 VITALS — BP 170/92 | HR 76 | Wt 151.4 lb

## 2021-05-19 DIAGNOSIS — K641 Second degree hemorrhoids: Secondary | ICD-10-CM

## 2021-05-19 NOTE — Patient Instructions (Addendum)
If you are age 67 or older, your body mass index should be between 23-30. Your Body mass index is 25.19 kg/m. If this is out of the aforementioned range listed, please consider follow up with your Primary Care Provider.  If you are age 75 or younger, your body mass index should be between 19-25. Your Body mass index is 25.19 kg/m. If this is out of the aformentioned range listed, please consider follow up with your Primary Care Provider.   __________________________________________________________  The [facility] providers would like to encourage you to use [facility] to communicate with providers for non-urgent requests or questions.  Due to long hold times on the telephone, sending your provider a message by [facility] may be a faster and more efficient way to get a response.  Please allow 48 business hours for a response.  Please remember that this is for non-urgent requests.    HEMORRHOID BANDING PROCEDURE    FOLLOW-UP CARE   The procedure you have had should have been relatively painless since the banding of the area involved does not have nerve endings and there is no pain sensation.  The rubber band cuts off the blood supply to the hemorrhoid and the band may fall off as soon as 48 hours after the banding (the band may occasionally be seen in the toilet bowl following a bowel movement). You may notice a temporary feeling of fullness in the rectum which should respond adequately to plain Tylenol or Motrin.  Following the banding, avoid strenuous exercise that evening and resume full activity the next day.  A sitz bath (soaking in a warm tub) or bidet is soothing, and can be useful for cleansing the area after bowel movements.     To avoid constipation, take two tablespoons of natural wheat bran, natural oat bran, flax, Benefiber or any over the counter fiber supplement and increase your water intake to 7-8 glasses daily.    Unless you have been prescribed anorectal medication, do not put  anything inside your rectum for two weeks: No suppositories, enemas, fingers, etc.  Occasionally, you may have more bleeding than usual after the banding procedure.  This is often from the untreated hemorrhoids rather than the treated one.  Dont be concerned if there is a tablespoon or so of blood.  If there is more blood than this, lie flat with your bottom higher than your head and apply an ice pack to the area. If the bleeding does not stop within a half an hour or if you feel faint, call our office at [phone] or go to the emergency room.  Problems are not common; however, if there is a substantial amount of bleeding, severe pain, chills, fever or difficulty passing urine (very rare) or other problems, you should call us at [phone] or report to the nearest emergency room.  Do not stay seated continuously for more than 2-3 hours for a day or two after the procedure.  Tighten your buttock muscles 10-15 times every two hours and take 10-15 deep breaths every 1-2 hours.  Do not spend more than a few minutes on the toilet if you cannot empty your bowel; instead re-visit the toilet at a later time.    Return to the clinic in 4 weeks, 06/21/21 @11 :20 am for your 2nd banding.  Thank you for choosing me and [facility].  Vito Cirigliano, D.O.

## 2021-05-19 NOTE — Progress Notes (Signed)
Chief Complaint:    Symptomatic Internal Hemorrhoids; Hemorrhoid Band Ligation  GI History:  Longstanding history of GERD.  Previously well controlled with Zantac, then changed to famotidine 20 mg/day.  Will take a second dose for breakthrough reflux symptoms.     Developed dysphagia in 2021.  EGD in 09/2020 with esophageal dilation with 19 [place] with resolution of dysphagia as below.   Has a history of colon polyps including a "grapelike cluster" that had to be removed surgically in the 1990s.  Since then, has been on 5-year surveillance interval.  History of symptomatic hemorrhoids with intermittent BRBPR and rectal itching.  Suboptimal response to topical medications and conservative management. - 05/19/2021: Presents for hemorrhoid banding     Endoscopic History: - Colonoscopy (09/2009): Large hemorrhoids, otherwise normal.  Repeat in 5 years due to prior history of polyps - EGD (09/2020, Dr. Bryan Lemma): Esophageal longitudinal furrows (biopsies negative for EOE) dilated with 9 [place] with mucosal rent at 16 cm and at the GEJ.  10 mm gastric ulcer, moderate gastritis (path benign).  Treated with Protonix 40 mg bid and Carafate - Colonoscopy (09/2020, Dr. Bryan Lemma): 5 mm sigmoid polyp (TA), 2 rectal hyperplastic polyps, post polypectomy scar without residual polyp in rectum, sigmoid diverticulosis, internal hemorrhoids.  Repeat 5 years - EGD (10/2020, Dr. Bryan Lemma): Normal esophagus, mild non-H. pylori gastritis  HPI:     Patient is a 65 y.o. malewith a history of symptomatic internal hemorrhoids presenting to the [facility] for follow-up and ongoing treatment. The patient presents with symptomatic grade 2 hemorrhoids, unresponsive to maximal medical therapy, requesting rubber band ligation of symptomatic hemorrhoidal disease.  No change in medical or surgical history, medications, allergies, social history since last appointment with me.   Review of  systems:     No chest pain, no SOB, no fevers, no urinary sx   Past Medical History:  Diagnosis Date   Anxiety    Arthritis    Chronic migraine    Depression    Diabetes mellitus without complication (HCC)    Heart murmur    Hypertension    Kidney stones    Raynaud's disease     Patient's surgical history, family medical history, social history, medications and allergies were all reviewed in Epic    Current Outpatient Medications  Medication Sig Dispense Refill   alendronate (FOSAMAX) 70 MG tablet Take 70 mg by mouth once a week. Take with a full glass of water on an empty stomach.     amLODipine (NORVASC) 2.5 MG tablet TAKE 1 TABLET EVERY DAY (NEED TO SCHEDULE A 6 MONTH FOLLOW UP APPOINTMENT) 90 tablet 1   Calcium Carbonate-Vitamin D (CALCIUM 600+D PO) Take by mouth daily.     cyclobenzaprine (FLEXERIL) 10 MG tablet Take 10 mg by mouth 3 (three) times daily as needed.     folic acid (FOLVITE) 1 MG tablet Take 1 mg by mouth daily.     Fremanezumab-vfrm (AJOVY) 225 MG/1.5ML SOAJ Inject 1.5 mLs into the skin every 30 (thirty) days.     levETIRAcetam (KEPPRA) 750 MG tablet Take 750 mg by mouth 2 (two) times daily.      lisinopril (ZESTRIL) 10 MG tablet TAKE 1 TABLET EVERY DAY 90 tablet 3   LORazepam (ATIVAN) 1 MG tablet TAKE 1/2 TO 1 TABLET EVERY DAY AS NEEDED FOR ANXIETY. USE AS SPARINGLY AS POSSIBLE 30 tablet 2   Magnesium 400 MG TABS Take 1 tablet by mouth daily as needed.  naproxen sodium (ALEVE) 220 MG tablet      pantoprazole (PROTONIX) 40 MG tablet Take 1 tablet (40 mg total) by mouth 2 (two) times daily. Protonix 40 mg twice daily for 8 weeks, then reduce to 40 mg daily. 90 tablet 3   polyethylene glycol powder (GLYCOLAX/MIRALAX) powder Take 17 g by mouth daily. 3350 g prn   predniSONE (DELTASONE) 10 MG tablet Take 10 mg by mouth daily as needed.     rosuvastatin (CRESTOR) 5 MG tablet TAKE 1 TABLET EVERY DAY 90 tablet 3   Upadacitinib ER 15 MG TB24 Take by mouth daily.      tadalafil (CIALIS) 5 MG tablet Take 1 tablet (5 mg total) by mouth daily as needed for erectile dysfunction. 90 tablet 2   terbinafine (LAMISIL) 250 MG tablet Take 1 tablet (250 mg total) by mouth daily. 30 tablet 1   Current Facility-Administered Medications  Medication Dose Route Frequency Provider Last Rate Last Admin   methylPREDNISolone acetate (DEPO-MEDROL) injection 80 mg  80 mg Other Once Magnus Sinning, MD        Physical Exam:     BP (!) 170/92 (BP Location: Left Arm, Patient Position: Sitting, Cuff Size: Normal)    Pulse 76    Wt 151 lb 6 oz (68.7 kg)    SpO2 98%    BMI 25.19 kg/m   GENERAL:  Pleasant male in NAD PSYCH: : Cooperative, normal affect Musculoskeletal:  Normal muscle tone, normal strength NEURO: Alert and oriented x 3, no focal neurologic deficits Rectal exam: Sensation intact and preserved anal wink.  Grade 2 hemorrhoids noted in all positions on anoscopy.  No external anal fissures noted. Normal sphincter tone. No palpable mass. No blood on the exam glove. (Chaperone: Renee Rival, CMA).   IMPRESSION and PLAN:    #1.  Symptomatic internal hemorrhoids: PROCEDURE NOTE: The patient presents with symptomatic grade 2 hemorrhoids, unresponsive to maximal medical therapy, requesting rubber band ligation of symptomatic hemorrhoidal disease.  All risks, benefits and alternative forms of therapy were described and informed consent was obtained.  In the Left Lateral Decubitus position, anoscopic examination revealed grade 2 hemorrhoids in the all position(s).  The anorectum was pre-medicated with RectiCare. The decision was made to band the LL internal hemorrhoid, and the [place] was used to perform band ligation without complication.  Digital anorectal examination was then performed to assure proper positioning of the band, and to adjust the banded tissue as required.  The patient was discharged home without pain or other issues.  Dietary and behavioral  recommendations were given and along with follow-up instructions.     The following adjunctive treatments were recommended:  -Resume high-fiber diet with fiber supplement (i.e. Citrucel or Benefiber) with goal for soft stools without straining to have a BM. -Resume adequate fluid intake.  The patient will return in 4 for follow-up and possible additional banding as required. No complications were encountered and the patient tolerated the procedure well.      #2.  GERD - Well-controlled on PPI - Continue antireflux lifestyle/dietary modifications  #3.  Dysphagia - Resolved with esophageal dilation  #4.  History of colon polyps - Repeat colonoscopy in 2027 for ongoing polyp surveillance      John Mcclain ,DO, FACG 05/19/2021, 9:34 AM

## 2021-05-24 NOTE — Progress Notes (Signed)
John Mcclain - 65 y.o. male MRN [id number]  Date of birth: [birth year]  Office Visit Note: Visit Date: 05/17/2021 PCP: Horald Pollen, MD Referred by: Horald Pollen, *  Subjective: Chief Complaint  Patient presents with   Lower Back - Pain   HPI:  John Mcclain is a 65 y.o. male who comes in today for planned repeat Bilateral L3-4  Lumbar Transforaminal epidural steroid injection with fluoroscopic guidance.  The patient has failed conservative care including home exercise, medications, time and activity modification.  This injection will be diagnostic and hopefully therapeutic.  Please see requesting physician notes for further details and justification. Patient received more than 50% pain relief from prior injection.  Last injection was in October.  He is getting about 6 weeks of relief.  He has pretty severe stenosis at L3-4.  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  Unfortunately patient really probably should consider surgical consultation for information on decompression.  Referring: Dr. Legrand Como Hilts   ROS Otherwise per HPI.  Assessment & Plan: Visit Diagnoses:    ICD-10-CM   1. Lumbar radiculopathy  M54.16 XR C-ARM NO REPORT    Epidural Steroid injection    methylPREDNISolone acetate (DEPO-MEDROL) injection 80 mg    2. Spinal stenosis of lumbar region with neurogenic claudication  M48.062 XR C-ARM NO REPORT    Epidural Steroid injection    methylPREDNISolone acetate (DEPO-MEDROL) injection 80 mg      Plan: No additional findings.   Meds & Orders:  Meds ordered this encounter  Medications   methylPREDNISolone acetate (DEPO-MEDROL) injection 80 mg    Orders Placed This Encounter  Procedures   XR C-ARM NO REPORT   Epidural Steroid injection    Follow-up: Return if symptoms worsen or fail to improve.   Procedures: No procedures performed  Lumbosacral Transforaminal Epidural Steroid Injection - Sub-Pedicular  Approach with Fluoroscopic Guidance  Patient: John Mcclain      Date of Birth: 1956-06-02 MRN: [id number] PCP: Horald Pollen, MD      Visit Date: 05/17/2021   Universal Protocol:    Date/Time: 05/17/2021  Consent Given By: the patient  Position: PRONE  Additional Comments: Vital signs were monitored before and after the procedure. Patient was prepped and draped in the usual sterile fashion. The correct patient, procedure, and site was verified.   Injection Procedure Details:   Procedure diagnoses: Lumbar radiculopathy [M54.16]    Meds Administered:  Meds ordered this encounter  Medications   methylPREDNISolone acetate (DEPO-MEDROL) injection 80 mg    Laterality: Bilateral  Location/Site: L3 *sacralized L5 transitional segment.  Needle:5.0 in., 22 ga.  Short bevel or Quincke spinal needle  Needle Placement: Transforaminal  Findings:    -Comments: Excellent flow of contrast along the nerve, nerve root and into the epidural space.  Procedure Details: After squaring off the end-plates to get a true AP view, the C-arm was positioned so that an oblique view of the foramen as noted above was visualized. The target area is just inferior to the "nose of the scotty dog" or sub pedicular. The soft tissues overlying this structure were infiltrated with 2-3 ml. of 1% Lidocaine without Epinephrine.  The spinal needle was inserted toward the target using a "trajectory" view along the fluoroscope beam.  Under AP and lateral visualization, the needle was advanced so it did not puncture dura and was located close the 6 O'Clock position of the pedical in AP tracterory. Biplanar projections were used to confirm position.  Aspiration was confirmed to be negative for CSF and/or blood. A 1-2 ml. volume of Isovue-250 was injected and flow of contrast was noted at each level. Radiographs were obtained for documentation purposes.   After attaining the desired flow of contrast documented  above, a 0.5 to 1.0 ml test dose of 0.25% Marcaine was injected into each respective transforaminal space.  The patient was observed for 90 seconds post injection.  After no sensory deficits were reported, and normal lower extremity motor function was noted,   the above injectate was administered so that equal amounts of the injectate were placed at each foramen (level) into the transforaminal epidural space.   Additional Comments:  No complications occurred Dressing: 2 x 2 sterile gauze and Band-Aid    Post-procedure details: Patient was observed during the procedure. Post-procedure instructions were reviewed.  Patient left the clinic in stable condition.     Clinical History: MRI LUMBAR SPINE WITHOUT CONTRAST   TECHNIQUE: Multiplanar, multisequence MR imaging of the lumbar spine was performed. No intravenous contrast was administered.   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 Careful correlation with this numbering strategy prior to any procedural intervention would be recommended.   Alignment: Dextroconvex scoliosis of the lumbar spine. Grade 1 anterolisthesis of L4 over L5, degenerative. Small retrolisthesis from T12 through L3.   Vertebrae: No acute fracture, evidence of discitis, or bone lesion. Mild chronic compression fracture of L4 with approximately 30% loss of vertebral body height. Endplate degenerative changes at T12-L1, L3-4 and L4-5.   Conus medullaris and cauda equina: Conus extends to the T12-L1 level. Conus and cauda equina appear normal.   Paraspinal and other soft tissues: Negative.   Disc levels:   T12-L1: Disc bulge with associated osteophytic component and facet degenerative changes resulting in narrowing of the bilateral subarticular zones, mild-to-moderate right and moderate left neural foraminal narrowing.   L1-2: Disc bulge, facet  degenerative changes and ligamentum flavum redundancy resulting in mild spinal canal stenosis with effacement of the bilateral subarticular zones, mild right and moderate left neural foraminal narrowing.   L2-3: Left asymmetric disc bulge, facet degenerative changes and ligamentum flavum redundancy resulting in moderate spinal canal stenosis with narrowing of the bilateral subarticular zones, moderate right and severe left neural foraminal narrowing.   L3-4: Right asymmetric disc bulge with superimposed central disc extrusion migrating inferiorly, facet degenerative changes and ligamentum flavum redundancy resulting in moderate to severe spinal canal stenosis and severe bilateral neural foraminal.   L4-5: Disc bulge, prominent hypertrophic facet degenerative change ligamentum flavum redundancy resulting in moderate spinal canal stenosis with narrowing of the right subarticular zone and severe right neural foraminal narrowing.   L5-S1: No spinal canal or neural foraminal stenosis.   IMPRESSION: 1. 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 Careful correlation with this numbering strategy prior to any procedural intervention would be recommended. 2. Multilevel degenerative changes of the lumbar spine with moderate to severe spinal canal stenosis at L3-4 and moderate spinal canal stenosis at L2-3 and L4-5. 3. Multilevel neural foraminal narrowing, severe on the left at L2-3, bilaterally at L3-4 and on the right at L4-5. 4. Mild chronic compression fracture of L4 with approximately 30% loss of vertebral body height.     Electronically Signed   By: Pedro Earls M.D.   On: 03/26/2020 14:46     Objective:  VS:  HT:     WT:    BMI:      BP:(!) 159/77   HR:97bpm   TEMP: ( )   RESP:  Physical Exam Vitals and nursing note reviewed.  Constitutional:      General: He is not in acute distress.    Appearance: Normal appearance.  He is not ill-appearing.  HENT:     Head: Normocephalic and atraumatic.     Right Ear: External ear normal.     Left Ear: External ear normal.     Nose: No congestion.  Eyes:     Extraocular Movements: Extraocular movements intact.  Cardiovascular:     Rate and Rhythm: Normal rate.     Pulses: Normal pulses.  Pulmonary:     Effort: Pulmonary effort is normal. No respiratory distress.  Abdominal:     General: There is no distension.     Palpations: Abdomen is soft.  Musculoskeletal:        General: No tenderness or signs of injury.     Cervical back: Neck supple.     Right lower leg: No edema.     Left lower leg: No edema.     Comments: Patient has good distal strength without clonus.  Skin:    Findings: No erythema or rash.  Neurological:     General: No focal deficit present.     Mental Status: He is alert and oriented to person, place, and time.     Sensory: No sensory deficit.     Motor: No weakness or abnormal muscle tone.     Coordination: Coordination normal.  Psychiatric:        Mood and Affect: Mood normal.        Behavior: Behavior normal.     Imaging: No results found.

## 2021-05-24 NOTE — Procedures (Signed)
Lumbosacral Transforaminal Epidural Steroid Injection - Sub-Pedicular Approach with Fluoroscopic Guidance  Patient: John John      Date of Birth: January 09, 1956 MRN: [id number] PCP: Horald Pollen, MD      Visit Date: 05/17/2021   Universal Protocol:    Date/Time: 05/17/2021  Consent Given By: the patient  Position: PRONE  Additional Comments: Vital signs were monitored before and after the procedure. Patient was prepped and draped in the usual sterile fashion. The correct patient, procedure, and site was verified.   Injection Procedure Details:   Procedure diagnoses: Lumbar radiculopathy [M54.16]    Meds Administered:  Meds ordered this encounter  Medications   methylPREDNISolone acetate (DEPO-MEDROL) injection 80 mg    Laterality: Bilateral  Location/Site: L3 *sacralized L5 transitional segment.  Needle:5.0 in., 22 ga.  Short bevel or Quincke spinal needle  Needle Placement: Transforaminal  Findings:    -Comments: Excellent flow of contrast along the nerve, nerve root and into the epidural space.  Procedure Details: After squaring off the end-plates to get a true AP view, the C-arm was positioned so that an oblique view of the foramen as noted above was visualized. The target area is just inferior to the "nose of the scotty dog" or sub pedicular. The soft tissues overlying this structure were infiltrated with 2-3 ml. of 1% Lidocaine without Epinephrine.  The spinal needle was inserted toward the target using a "trajectory" view along the fluoroscope beam.  Under AP and lateral visualization, the needle was advanced so it did not puncture dura and was located close the 6 O'Clock position of the pedical in AP tracterory. Biplanar projections were used to confirm position. Aspiration was confirmed to be negative for CSF and/or blood. A 1-2 ml. volume of Isovue-250 was injected and flow of contrast was noted at each level. Radiographs were obtained for documentation  purposes.   After attaining the desired flow of contrast documented above, a 0.5 to 1.0 ml test dose of 0.25% Marcaine was injected into each respective transforaminal space.  The patient was observed for 90 seconds post injection.  After no sensory deficits were reported, and normal lower extremity motor function was noted,   the above injectate was administered so that equal amounts of the injectate were placed at each foramen (level) into the transforaminal epidural space.   Additional Comments:  No complications occurred Dressing: 2 x 2 sterile gauze and Band-Aid    Post-procedure details: Patient was observed during the procedure. Post-procedure instructions were reviewed.  Patient left the clinic in stable condition.

## 2021-06-05 HISTORY — PX: ESOPHAGEAL DILATION: SHX303

## 2021-06-05 HISTORY — PX: COLONOSCOPY WITH ESOPHAGOGASTRODUODENOSCOPY (EGD): SHX5779

## 2021-06-19 ENCOUNTER — Other Ambulatory Visit: Payer: Self-pay | Admitting: Dermatology

## 2021-06-19 DIAGNOSIS — L739 Follicular disorder, unspecified: Secondary | ICD-10-CM

## 2021-06-21 ENCOUNTER — Encounter: Payer: Self-pay | Admitting: Gastroenterology

## 2021-06-21 ENCOUNTER — Other Ambulatory Visit: Payer: Self-pay

## 2021-06-21 ENCOUNTER — Ambulatory Visit: Payer: Medicare PPO | Admitting: Gastroenterology

## 2021-06-21 VITALS — BP 152/102 | HR 72 | Ht 65.0 in | Wt 154.1 lb

## 2021-06-21 DIAGNOSIS — K649 Unspecified hemorrhoids: Secondary | ICD-10-CM

## 2021-06-21 NOTE — Patient Instructions (Addendum)
If you are age 68 or older, your body mass index should be between 23-30. Your Body mass index is 25.65 kg/m. If this is out of the aforementioned range listed, please consider follow up with your Primary Care Provider.  If you are age 62 or younger, your body mass index should be between 19-25. Your Body mass index is 25.65 kg/m. If this is out of the aformentioned range listed, please consider follow up with your Primary Care Provider.   __________________________________________________________  The [facility] providers would like to encourage you to use [facility] to communicate with providers for non-urgent requests or questions.  Due to long hold times on the telephone, sending your provider a message by [facility] may be a faster and more efficient way to get a response.  Please allow 48 business hours for a response.  Please remember that this is for non-urgent requests.   Due to recent changes in healthcare laws, you may see the results of your imaging and laboratory studies on MyChart before your provider has had a chance to review them.  We understand that in some cases there may be results that are confusing or concerning to you. Not all laboratory results come back in the same time frame and the provider may be waiting for multiple results in order to interpret others.  Please give [place] 48 hours in order for your provider to thoroughly review all the results before contacting the office for clarification of your results.   HEMORRHOID BANDING PROCEDURE    FOLLOW-UP CARE   The procedure you have had should have been relatively painless since the banding of the area involved does not have nerve endings and there is no pain sensation.  The rubber band cuts off the blood supply to the hemorrhoid and the band may fall off as soon as 48 hours after the banding (the band may occasionally be seen in the toilet bowl following a bowel movement). You may notice a temporary feeling of fullness in the rectum  which should respond adequately to plain Tylenol or Motrin.  Following the banding, avoid strenuous exercise that evening and resume full activity the next day.  A sitz bath (soaking in a warm tub) or bidet is soothing, and can be useful for cleansing the area after bowel movements.     To avoid constipation, take two tablespoons of natural wheat bran, natural oat bran, flax, Benefiber or any over the counter fiber supplement and increase your water intake to 7-8 glasses daily.    Unless you have been prescribed anorectal medication, do not put anything inside your rectum for two weeks: No suppositories, enemas, fingers, etc.  Occasionally, you may have more bleeding than usual after the banding procedure.  This is often from the untreated hemorrhoids rather than the treated one.  Dont be concerned if there is a tablespoon or so of blood.  If there is more blood than this, lie flat with your bottom higher than your head and apply an ice pack to the area. If the bleeding does not stop within a half an hour or if you feel faint, call our office at [phone] or go to the emergency room.  Problems are not common; however, if there is a substantial amount of bleeding, severe pain, chills, fever or difficulty passing urine (very rare) or other problems, you should call us at [phone] or report to the nearest emergency room.  Do not stay seated continuously for more than 2-3 hours for  a day or two after the procedure.  Tighten your buttock muscles 10-15 times every two hours and take 10-15 deep breaths every 1-2 hours.  Do not spend more than a few minutes on the toilet if you cannot empty your bowel; instead re-visit the toilet at a later time.    Next appt 2-21 at 11:20am  Thank you for choosing me and [facility].  Vito Cirigliano, D.O.

## 2021-06-21 NOTE — Progress Notes (Signed)
Chief Complaint:    Symptomatic Internal Hemorrhoids; Hemorrhoid Band Ligation  GI History: Longstanding history of GERD.  Previously well controlled with Zantac, then changed to famotidine 20 mg/day.  Will take a second dose for breakthrough reflux symptoms.     Developed dysphagia in 2021.  EGD in 09/2020 with esophageal dilation with [address] [place] with resolution of dysphagia as below.   Has a history of colon polyps including a "grapelike cluster" that had to be removed surgically in the 1990s.  Since then, has been on 5-year surveillance interval.   History of symptomatic hemorrhoids with intermittent BRBPR and rectal itching.  Suboptimal response to topical medications and conservative management. -05/19/2021: Banding of the LL hemorrhoid -06/21/2021: Presents for hemorrhoid banding #2     Endoscopic History: - Colonoscopy (09/2009): Large hemorrhoids, otherwise normal.  Repeat in 5 years due to prior history of polyps - EGD (09/2020, Dr. Bryan Lemma): Esophageal longitudinal furrows (biopsies negative for EOE) dilated with 81 [place] with mucosal rent at 16 cm and at the GEJ.  10 mm gastric ulcer, moderate gastritis (path benign).  Treated with Protonix 40 mg bid and Carafate - Colonoscopy (09/2020, Dr. Bryan Lemma): 5 mm sigmoid polyp (TA), 2 rectal hyperplastic polyps, post polypectomy scar without residual polyp in rectum, sigmoid diverticulosis, internal hemorrhoids.  Repeat 5 years - EGD (10/2020, Dr. Bryan Lemma): Normal esophagus, mild non-H. pylori gastritis  HPI:     Patient is a 66 y.o. malewith a history of symptomatic internal hemorrhoids presenting to the [facility]ology [facility] for follow-up and ongoing treatment. The patient presents with symptomatic grade 2 hemorrhoids, unresponsive to maximal medical therapy, requesting rubber band ligation of symptomatic hemorrhoidal disease.  No change in medical or surgical history, medications, allergies, social history  since last appointment with me.   Review of systems:     No chest pain, no SOB, no fevers, no urinary sx   Past Medical History:  Diagnosis Date   Anxiety    Arthritis    Chronic migraine    Depression    Diabetes mellitus without complication (HCC)    Heart murmur    Hypertension    Kidney stones    Raynaud's disease     Patient's surgical history, family medical history, social history, medications and allergies were all reviewed in Epic    Current Outpatient Medications  Medication Sig Dispense Refill   alendronate (FOSAMAX) 70 MG tablet Take 70 mg by mouth once a week. Take with a full glass of water on an empty stomach.     amLODipine (NORVASC) 2.5 MG tablet TAKE 1 TABLET EVERY DAY (NEED TO SCHEDULE A 6 MONTH FOLLOW UP APPOINTMENT) 90 tablet 1   Calcium Carbonate-Vitamin D (CALCIUM 600+D PO) Take by mouth daily.     clobetasol (OLUX) 0.05 % topical foam APPLY TOPICALLY TWICE DAILY 50 g 0   cyclobenzaprine (FLEXERIL) 10 MG tablet Take 10 mg by mouth 3 (three) times daily as needed.     folic acid (FOLVITE) 1 MG tablet Take 1 mg by mouth daily.     Fremanezumab-vfrm (AJOVY) 225 MG/1.5ML SOAJ Inject 1.5 mLs into the skin every 30 (thirty) days.     levETIRAcetam (KEPPRA) 750 MG tablet Take 750 mg by mouth 2 (two) times daily.      lisinopril (ZESTRIL) 10 MG tablet TAKE 1 TABLET EVERY DAY 90 tablet 3   LORazepam (ATIVAN) 1 MG tablet TAKE 1/2 TO 1 TABLET EVERY DAY AS NEEDED FOR ANXIETY. USE AS SPARINGLY AS POSSIBLE  30 tablet 2   Magnesium 400 MG TABS Take 1 tablet by mouth daily as needed.     naproxen sodium (ALEVE) 220 MG tablet      pantoprazole (PROTONIX) 40 MG tablet Take 1 tablet (40 mg total) by mouth 2 (two) times daily. Protonix 40 mg twice daily for 8 weeks, then reduce to 40 mg daily. 90 tablet 3   polyethylene glycol powder (GLYCOLAX/MIRALAX) powder Take 17 g by mouth daily. 3350 g prn   predniSONE (DELTASONE) 10 MG tablet Take 10 mg by mouth daily as needed.      rosuvastatin (CRESTOR) 5 MG tablet TAKE 1 TABLET EVERY DAY 90 tablet 3   Upadacitinib ER 15 MG TB24 Take by mouth daily.     minocycline (MINOCIN) 50 MG capsule TAKE 1 CAPSULE TWICE DAILY (Patient not taking: Reported on 06/21/2021) 120 capsule 1   tadalafil (CIALIS) 5 MG tablet Take 1 tablet (5 mg total) by mouth daily as needed for erectile dysfunction. 90 tablet 2   No current facility-administered medications for this visit.    Physical Exam:     BP (!) 152/102    Pulse 72    Ht 5\' 5"  (1.651 m)    Wt 154 lb 2 oz (69.9 kg)    BMI 25.65 kg/m   GENERAL:  Pleasant male in NAD PSYCH: : Cooperative, normal affect NEURO: Alert and oriented x 3, no focal neurologic deficits Rectal exam: Sensation intact and preserved anal wink.  Grade 2 hemorrhoids noted in RA/RP positions on anoscopy.  No external anal fissures noted. Normal sphincter tone. No palpable mass. No blood on the exam glove. (Chaperone: Curlene Labrum, CMA).   IMPRESSION and PLAN:    #1.  Symptomatic internal hemorrhoids: PROCEDURE NOTE: The patient presents with symptomatic grade 2 hemorrhoids, unresponsive to maximal medical therapy, requesting rubber band ligation of symptomatic hemorrhoidal disease.  All risks, benefits and alternative forms of therapy were described and informed consent was obtained.  In the Left Lateral Decubitus position, anoscopic examination revealed grade 2 hemorrhoids in the RA/RP position(s).  The anorectum was pre-medicated with RectiCare. The decision was made to band the RP internal hemorrhoid, and the [place] was used to perform band ligation without complication.  Digital anorectal examination was then performed to assure proper positioning of the band, and to adjust the banded tissue as required.  The patient was discharged home without pain or other issues.  Dietary and behavioral recommendations were given and along with follow-up instructions.     The following adjunctive treatments  were recommended:  -Resume high-fiber diet with fiber supplement (i.e. Citrucel or Benefiber) with goal for soft stools without straining to have a BM. -Resume adequate fluid intake.  The patient will return in 4 for  follow-up and possible additional banding as required. No complications were encountered and the patient tolerated the procedure well.     #2.  GERD - Well-controlled on PPI - Continue antireflux lifestyle/dietary modifications  #3.  Dysphagia - Resolved with esophageal dilation  #4.  History of colon polyps - Repeat colonoscopy in 2027 for ongoing polyp surveillance      John Mcclain ,DO, FACG 06/21/2021, 11:32 AM

## 2021-07-15 ENCOUNTER — Telehealth: Payer: Self-pay | Admitting: Physical Medicine and Rehabilitation

## 2021-07-15 NOTE — Telephone Encounter (Signed)
Patient called. He would like an appointment with Dr. Newton.  

## 2021-07-20 ENCOUNTER — Ambulatory Visit: Payer: Medicare PPO | Admitting: Physical Medicine and Rehabilitation

## 2021-07-20 ENCOUNTER — Encounter: Payer: Self-pay | Admitting: Physical Medicine and Rehabilitation

## 2021-07-20 ENCOUNTER — Other Ambulatory Visit: Payer: Self-pay

## 2021-07-20 VITALS — BP 128/74 | HR 74

## 2021-07-20 DIAGNOSIS — M4316 Spondylolisthesis, lumbar region: Secondary | ICD-10-CM

## 2021-07-20 DIAGNOSIS — M5116 Intervertebral disc disorders with radiculopathy, lumbar region: Secondary | ICD-10-CM

## 2021-07-20 DIAGNOSIS — G5701 Lesion of sciatic nerve, right lower limb: Secondary | ICD-10-CM

## 2021-07-20 DIAGNOSIS — M5416 Radiculopathy, lumbar region: Secondary | ICD-10-CM

## 2021-07-20 DIAGNOSIS — M7918 Myalgia, other site: Secondary | ICD-10-CM

## 2021-07-20 DIAGNOSIS — M47816 Spondylosis without myelopathy or radiculopathy, lumbar region: Secondary | ICD-10-CM

## 2021-07-20 DIAGNOSIS — M48062 Spinal stenosis, lumbar region with neurogenic claudication: Secondary | ICD-10-CM | POA: Diagnosis not present

## 2021-07-20 NOTE — Progress Notes (Signed)
John Mcclain - 66 y.o. male MRN [id number]  Date of birth: 1956/03/26  Office Visit Note: Visit Date: 07/20/2021 PCP: Horald Pollen, MD Referred by: Horald Pollen, *  Subjective: Chief Complaint  Patient presents with   Lower Back - Pain   Right Leg - Pain   Left Leg - Pain   Right Ankle - Pain   HPI: John Mcclain is a 66 y.o. male who comes in today for evaluation of chronic, worsening and severe right sided buttock pain with radiation down lateral leg to ankle. Patient reports pain has been ongoing for several years. Patient states pain is exacerbated by walking and prolonged standing, describes pain as a tingling and burning sensation, currently rates as 8 out of 10. Patient reports some relief of pain with home exercise regimen, use of heating pad and medications. Patient reports history of formal physical therapy with Dr. Rudene Anda at [facility] and states some relief of pain with these treatments. Patients lumbar MRI from 2021 exhibits transitional lumbosacral vertebrae that is assumed to represent a sacralized L5 level, moderate to severe spinal canal stenosis at L3-L4, moderate spinal canal stenosis at L2-L3 and L4-L5. There is also prominent facet hypertrophy at the level of L4-L5. Patient has had a number of lumbar epidural steroid injections over the years, most of them providing short term relief of pain for 6 weeks. Left L4-L5 interlaminar epidural steroid injection performed by Dr. Magnus Sinning in 2021 gave him the most significant relief of pain, lasting approximately 3 months. Patient denies focal weakness, numbness and tingling. Patient denies recent trauma or falls.   Review of Systems  Musculoskeletal:  Positive for back pain and myalgias.  Neurological:  Positive for tingling. Negative for sensory change, focal weakness and weakness.  All other systems reviewed and are negative. Otherwise per HPI.  Assessment & Plan: Visit Diagnoses:     ICD-10-CM   1. Lumbar radiculopathy  M54.16 Ambulatory referral to Physical Medicine Rehab    2. Spinal stenosis of lumbar region with neurogenic claudication  M48.062 Ambulatory referral to Physical Medicine Rehab    3. Radiculopathy due to lumbar intervertebral disc disorder  M51.16 Ambulatory referral to Physical Medicine Rehab    4. Facet hypertrophy of lumbar region  M47.816     5. Spondylolisthesis of lumbar region  M43.16 Ambulatory referral to Physical Medicine Rehab    6. Piriformis syndrome, right  G57.01 Ambulatory referral to Physical Therapy    7. Myofascial pain syndrome  M79.18 Ambulatory referral to Physical Therapy       Plan: Findings:  Chronic, worsening and severe right sided buttock pain radiating to lateral leg down to ankle. Patient continues to have excruciating and debilitating pain despite good conservative therapy such as home exercise program, formal physical therapy, use of heating pad and medications. Patients clinical presentation and exam are consistent with both neurogenic claudication as a result of spinal canal stenosis and piriformis syndrome. Patient does have tenderness and pain upon palpation of right buttock region. We believe the next step is to perform a diagnostic and hopefully therapeutic right L4-L5 interlaminar epidural steroid injection under fluoroscopic guidance. Patient is not currently on long term anticoagulant therapy. We also placed a referral for formal in-house physical therapy with a focus on manual treatments/dry needling to treat possible piriformis syndrome. If patients pain persists after epidural steroid injection and PT we would consider performing piriformis injection. We did briefly speak with patient about possible surgical consultation and will hold  on this for now. No red flag symptoms noted upon exam today.    Meds & Orders: No orders of the defined types were placed in this encounter.   Orders Placed This Encounter   Procedures   Ambulatory referral to Physical Medicine Rehab   Ambulatory referral to Physical Therapy    Follow-up: Return for Right L4-L5 interlaminar epidural steroid injection.   Procedures: No procedures performed      Clinical History: MRI LUMBAR SPINE WITHOUT CONTRAST   TECHNIQUE: Multiplanar, multisequence MR imaging of the lumbar spine was performed. No intravenous contrast was administered.   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 Careful correlation with this numbering strategy prior to any procedural intervention would be recommended.   Alignment: Dextroconvex scoliosis of the lumbar spine. Grade 1 anterolisthesis of L4 over L5, degenerative. Small retrolisthesis from T12 through L3.   Vertebrae: No acute fracture, evidence of discitis, or bone lesion. Mild chronic compression fracture of L4 with approximately 30% loss of vertebral body height. Endplate degenerative changes at T12-L1, L3-4 and L4-5.   Conus medullaris and cauda equina: Conus extends to the T12-L1 level. Conus and cauda equina appear normal.   Paraspinal and other soft tissues: Negative.   Disc levels:   T12-L1: Disc bulge with associated osteophytic component and facet degenerative changes resulting in narrowing of the bilateral subarticular zones, mild-to-moderate right and moderate left neural foraminal narrowing.   L1-2: Disc bulge, facet degenerative changes and ligamentum flavum redundancy resulting in mild spinal canal stenosis with effacement of the bilateral subarticular zones, mild right and moderate left neural foraminal narrowing.   L2-3: Left asymmetric disc bulge, facet degenerative changes and ligamentum flavum redundancy resulting in moderate spinal canal stenosis with narrowing of the bilateral subarticular zones, moderate right and severe left neural foraminal  narrowing.   L3-4: Right asymmetric disc bulge with superimposed central disc extrusion migrating inferiorly, facet degenerative changes and ligamentum flavum redundancy resulting in moderate to severe spinal canal stenosis and severe bilateral neural foraminal.   L4-5: Disc bulge, prominent hypertrophic facet degenerative change ligamentum flavum redundancy resulting in moderate spinal canal stenosis with narrowing of the right subarticular zone and severe right neural foraminal narrowing.   L5-S1: No spinal canal or neural foraminal stenosis.   IMPRESSION: 1. 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 Careful correlation with this numbering strategy prior to any procedural intervention would be recommended. 2. Multilevel degenerative changes of the lumbar spine with moderate to severe spinal canal stenosis at L3-4 and moderate spinal canal stenosis at L2-3 and L4-5. 3. Multilevel neural foraminal narrowing, severe on the left at L2-3, bilaterally at L3-4 and on the right at L4-5. 4. Mild chronic compression fracture of L4 with approximately 30% loss of vertebral body height.     Electronically Signed   By: Pedro Earls M.D.   On: 03/26/2020 14:46   He reports that he has never smoked. He has never used smokeless tobacco.  Recent Labs    01/11/21 1632  HGBA1C 6.1    Objective:  VS:  HT:     WT:    BMI:      BP:128/74   HR:74bpm   TEMP: ( )   RESP:  Physical Exam Vitals and nursing note reviewed.  HENT:     Head: Normocephalic and atraumatic.     Right Ear: External ear normal.  Left Ear: External ear normal.     Nose: Nose normal.     Mouth/Throat:     Mouth: Mucous membranes are moist.  Eyes:     Extraocular Movements: Extraocular movements intact.  Cardiovascular:     Rate and Rhythm: Normal rate.     Pulses: Normal pulses.  Pulmonary:     Effort: Pulmonary effort is normal.  Abdominal:     General:  Abdomen is flat. There is no distension.  Musculoskeletal:        General: Tenderness present.     Cervical back: Normal range of motion.     Comments: Pt rises from seated position to standing without difficulty. Good lumbar range of motion. Strong distal strength without clonus, no pain upon palpation of greater trochanters. Sensation intact bilaterally. Tenderness/pain upon palpation of right buttock region. Walks independently, gait steady.   Skin:    General: Skin is warm and dry.     Capillary Refill: Capillary refill takes less than 2 seconds.  Neurological:     General: No focal deficit present.     Mental Status: He is alert and oriented to person, place, and time.  Psychiatric:        Mood and Affect: Mood normal.    Ortho Exam  Imaging: No results found.  Past Medical/Family/Surgical/Social History: Medications & Allergies reviewed per EMR, new medications updated. Patient Active Problem List   Diagnosis Date Noted   Lumbar radiculopathy 05/03/2020   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   Spondylosis without myelopathy or radiculopathy, lumbar region 05/03/2020   Hypogonadism male 12/26/2011   Vitamin D deficiency 12/26/2011   Rheumatoid arthritis ([place]) 12/26/2011   Migraine headache disorder 12/26/2011   Anxiety disorder 12/26/2011   HTN (hypertension), benign 12/26/2011   Past Medical History:  Diagnosis Date   Anxiety    Arthritis    Chronic migraine    Depression    Diabetes mellitus without complication ([place])    Heart murmur    Hypertension    Kidney stones    Raynaud's disease    Family History  Problem Relation Age of Onset   Kidney disease Mother    Hypertension Mother    Arthritis Mother        RA   COPD Mother    Hyperlipidemia Mother    Heart attack Father    Diabetes Father    COPD Father    Hypertension Father    Migraines Brother    Stroke Maternal Grandfather    Alzheimer's disease Paternal  Grandmother    Colon cancer Neg Hx    Pancreatic cancer Neg Hx    Esophageal cancer Neg Hx    Past Surgical History:  Procedure Laterality Date   COLON SURGERY     LYMPH NODE DISSECTION     removed lymph node   VASECTOMY     Social History   Occupational History   Not on file  Tobacco Use   Smoking status: Never   Smokeless tobacco: Never  Vaping Use   Vaping Use: Never used  Substance and Sexual Activity   Alcohol use: Yes    Comment: 2-3 per week   Drug use: No   Sexual activity: Yes    Partners: Female

## 2021-07-20 NOTE — Progress Notes (Signed)
Pt state lower back pain that travels down his right leg to his ankle. Pt state his left leg has started to cause some pain. Pt state standing and sitting makes the pain worse. Pt state he takes pain meds to help ease his pain.  Numeric Pain Rating Scale and Functional Assessment Average Pain 10 Pain Right Now 6 My pain is constant, burning, dull, stabbing, tingling, and aching Pain is worse with: walking, bending, sitting, standing, and some activites Pain improves with: rest, heat/ice, medication, and injections   In the last MONTH (on 0-10 scale) has pain interfered with the following?  1. General activity like being  able to carry out your everyday physical activities such as walking, climbing stairs, carrying groceries, or moving a chair?  Rating(7)  2. Relation with others like being able to carry out your usual social activities and roles such as  activities at home, at work and in your community. Rating(8)  3. Enjoyment of life such that you have  been bothered by emotional problems such as feeling anxious, depressed or irritable?  Rating(9)

## 2021-07-26 ENCOUNTER — Ambulatory Visit: Payer: Medicare PPO | Admitting: Gastroenterology

## 2021-07-26 ENCOUNTER — Other Ambulatory Visit: Payer: Self-pay

## 2021-07-26 ENCOUNTER — Encounter: Payer: Self-pay | Admitting: Gastroenterology

## 2021-07-26 VITALS — BP 142/88 | HR 76 | Ht 65.0 in | Wt 151.0 lb

## 2021-07-26 DIAGNOSIS — K649 Unspecified hemorrhoids: Secondary | ICD-10-CM

## 2021-07-26 NOTE — Progress Notes (Signed)
Chief Complaint:    Symptomatic Internal Hemorrhoids; Hemorrhoid Band Ligation  GI History: Longstanding history of GERD.  Previously well controlled with Zantac, then changed to famotidine 20 mg/day.  Will take a second dose for breakthrough reflux symptoms.     Developed dysphagia in 2021.  EGD in 09/2020 with esophageal dilation with 50 [place] with resolution of dysphagia as below.   Has a history of colon polyps including a "grapelike cluster" that had to be removed surgically in the 1990s.  Since then, has been on 5-year surveillance interval.   History of symptomatic hemorrhoids with intermittent BRBPR and rectal itching.  Suboptimal response to topical medications and conservative management. -05/19/2021: Banding of the LL hemorrhoid - 06/21/2021: Banding of the RP hemorrhoid - 07/26/2021: Presents for hemorrhoid banding #3     Endoscopic History: - Colonoscopy (09/2009): Large hemorrhoids, otherwise normal.  Repeat in 5 years due to prior history of polyps - EGD (09/2020, Dr. Bryan Lemma): Esophageal longitudinal furrows (biopsies negative for EOE) dilated with 28 [place] with mucosal rent at 16 cm and at the GEJ.  10 mm gastric ulcer, moderate gastritis (path benign).  Treated with Protonix 40 mg bid and Carafate - Colonoscopy (09/2020, Dr. Bryan Lemma): 5 mm sigmoid polyp (TA), 2 rectal hyperplastic polyps, post polypectomy scar without residual polyp in rectum, sigmoid diverticulosis, internal hemorrhoids.  Repeat 5 years - EGD (10/2020, Dr. Bryan Lemma): Normal esophagus, mild non-H. pylori gastritis  HPI:     Patient is a 66 y.o. malewith a history of symptomatic internal hemorrhoids presenting to [facility] for follow-up and ongoing treatment. The patient presents with symptomatic grade 2 hemorrhoids, unresponsive to maximal medical therapy, requesting rubber band ligation of symptomatic hemorrhoidal disease.  Tolerated hemorrhoid banding x2 without  issue.  Did have single episode of BRBPR about 10 days ago.  Otherwise good response to banding.  No change in medical or surgical history, medications, allergies, social history since last appointment with me.   Review of systems:     No chest pain, no SOB, no fevers, no urinary sx   Past Medical History:  Diagnosis Date   Anxiety    Arthritis    Chronic migraine    Depression    Diabetes mellitus without complication (HCC)    Heart murmur    Hypertension    Kidney stones    Raynaud's disease     Patient's surgical history, family medical history, social history, medications and allergies were all reviewed in Epic    Current Outpatient Medications  Medication Sig Dispense Refill   alendronate (FOSAMAX) 70 MG tablet Take 70 mg by mouth once a week. Take with a full glass of water on an empty stomach.     amLODipine (NORVASC) 2.5 MG tablet TAKE 1 TABLET EVERY DAY (NEED TO SCHEDULE A 6 MONTH FOLLOW UP APPOINTMENT) 90 tablet 1   Calcium Carbonate-Vitamin D (CALCIUM 600+D PO) Take by mouth daily.     clobetasol (OLUX) 0.05 % topical foam APPLY TOPICALLY TWICE DAILY 50 g 0   cyclobenzaprine (FLEXERIL) 10 MG tablet Take 10 mg by mouth 3 (three) times daily as needed.     folic acid (FOLVITE) 1 MG tablet Take 1 mg by mouth daily.     Fremanezumab-vfrm (AJOVY) 225 MG/1.5ML SOAJ Inject 1.5 mLs into the skin every 30 (thirty) days.     levETIRAcetam (KEPPRA) 750 MG tablet Take 750 mg by mouth 2 (two) times daily.      lisinopril (ZESTRIL) 10 MG  tablet TAKE 1 TABLET EVERY DAY 90 tablet 3   LORazepam (ATIVAN) 1 MG tablet TAKE 1/2 TO 1 TABLET EVERY DAY AS NEEDED FOR ANXIETY. USE AS SPARINGLY AS POSSIBLE 30 tablet 2   Magnesium 400 MG TABS Take 1 tablet by mouth daily as needed.     minocycline (MINOCIN) 50 MG capsule TAKE 1 CAPSULE TWICE DAILY 120 capsule 1   naproxen sodium (ALEVE) 220 MG tablet      pantoprazole (PROTONIX) 40 MG tablet Take 1 tablet (40 mg total) by mouth 2 (two) times  daily. Protonix 40 mg twice daily for 8 weeks, then reduce to 40 mg daily. 90 tablet 3   polyethylene glycol powder (GLYCOLAX/MIRALAX) powder Take 17 g by mouth daily. 3350 g prn   predniSONE (DELTASONE) 10 MG tablet Take 10 mg by mouth daily as needed.     rosuvastatin (CRESTOR) 5 MG tablet TAKE 1 TABLET EVERY DAY 90 tablet 3   Upadacitinib ER 15 MG TB24 Take by mouth daily.     tadalafil (CIALIS) 5 MG tablet Take 1 tablet (5 mg total) by mouth daily as needed for erectile dysfunction. 90 tablet 2   No current facility-administered medications for this visit.    Physical Exam:     BP (!) 142/88    Pulse 76    Ht 5\' 5"  (1.651 m)    Wt 151 lb (68.5 kg)    SpO2 98%    BMI 25.13 kg/m   GENERAL:  Pleasant male in NAD Rectal exam: Sensation intact and preserved anal wink.  Grade 2 hemorrhoids noted in RA position.  No external anal fissures noted. Normal sphincter tone. No palpable mass. No blood on the exam glove. (Chaperone: Renee Rival, CMA).   IMPRESSION and PLAN:    #1.  Symptomatic internal hemorrhoids: PROCEDURE NOTE: The patient presents with symptomatic grade 2 hemorrhoids, unresponsive to maximal medical therapy, requesting rubber band ligation of symptomatic hemorrhoidal disease.  All risks, benefits and alternative forms of therapy were described and informed consent was obtained.  In the Left Lateral Decubitus position, anoscopic examination revealed grade 2 hemorrhoids in the RA position(s).  The anorectum was pre-medicated with RectiCare. The decision was made to band the RA internal hemorrhoid, and the [place] was used to perform band ligation without complication.  Digital anorectal examination was then performed to assure proper positioning of the band, and to adjust the banded tissue as required.  The patient was discharged home without pain or other issues.  Dietary and behavioral recommendations were given and along with follow-up instructions.     The  following adjunctive treatments were recommended:  -Resume high-fiber diet with fiber supplement (i.e. Citrucel or Benefiber) with goal for soft stools without straining to have a BM. -Resume adequate fluid intake.  The patient will return as needed for follow-up and possible additional banding as required. No complications were encountered and the patient tolerated the procedure well.      #2.  GERD - Well-controlled on PPI - Continue antireflux lifestyle/dietary modifications  #3.  Dysphagia - Resolved with esophageal dilation  #4.  History of colon polyps - Repeat colonoscopy in 2027 for ongoing polyp surveillance      John Mcclain ,DO, FACG 07/26/2021, 11:29 AM

## 2021-07-26 NOTE — Patient Instructions (Signed)
If you are age 68 or older, your body mass index should be between 23-30. Your Body mass index is 25.13 kg/m. If this is out of the aforementioned range listed, please consider follow up with your Primary Care Provider.  If you are age 35 or younger, your body mass index should be between 19-25. Your Body mass index is 25.13 kg/m. If this is out of the aformentioned range listed, please consider follow up with your Primary Care Provider.   ________________________________________________________  The  GI providers would like to encourage you to use [facility] to communicate with providers for non-urgent requests or questions.  Due to long hold times on the telephone, sending your provider a message by [facility] may be a faster and more efficient way to get a response.  Please allow 48 business hours for a response.  Please remember that this is for non-urgent requests.  _______________________________________________________  [facility]ayer Jew PROCEDURE    FOLLOW-UP CARE   The procedure you have had should have been relatively painless since the banding of the area involved does not have nerve endings and there is no pain sensation.  The rubber band cuts off the blood supply to the hemorrhoid and the band may fall off as soon as 48 hours after the banding (the band may occasionally be seen in the toilet bowl following a bowel movement). You may notice a temporary feeling of fullness in the rectum which should respond adequately to plain Tylenol or Motrin.  Following the banding, avoid strenuous exercise that evening and resume full activity the next day.  A sitz bath (soaking in a warm tub) or bidet is soothing, and can be useful for cleansing the area after bowel movements.     To avoid constipation, take two tablespoons of natural wheat bran, natural oat bran, flax, Benefiber or any over the counter fiber supplement and increase your water intake to 7-8 glasses daily.    Unless you  have been prescribed anorectal medication, do not put anything inside your rectum for two weeks: No suppositories, enemas, fingers, etc.  Occasionally, you may have more bleeding than usual after the banding procedure.  This is often from the untreated hemorrhoids rather than the treated one.  Dont be concerned if there is a tablespoon or so of blood.  If there is more blood than this, lie flat with your bottom higher than your head and apply an ice pack to the area. If the bleeding does not stop within a half an hour or if you feel faint, call our office at [phone] or go to the emergency room.  Problems are not common; however, if there is a substantial amount of bleeding, severe pain, chills, fever or difficulty passing urine (very rare) or other problems, you should call us at [phone] or report to the nearest emergency room.  Do not stay seated continuously for more than 2-3 hours for a day or two after the procedure.  Tighten your buttock muscles 10-15 times every two hours and take 10-15 deep breaths every 1-2 hours.  Do not spend more than a few minutes on the toilet if you cannot empty your bowel; instead re-visit the toilet at a later time.   Follow up as needed.  It was a pleasure to see you today!  Vito Cirigliano, D.O.

## 2021-07-28 ENCOUNTER — Other Ambulatory Visit: Payer: Self-pay

## 2021-07-28 ENCOUNTER — Ambulatory Visit: Payer: Self-pay

## 2021-07-28 ENCOUNTER — Encounter: Payer: Self-pay | Admitting: Physical Medicine and Rehabilitation

## 2021-07-28 ENCOUNTER — Ambulatory Visit: Payer: Medicare PPO | Admitting: Physical Medicine and Rehabilitation

## 2021-07-28 VITALS — BP 133/84 | HR 109

## 2021-07-28 DIAGNOSIS — M5416 Radiculopathy, lumbar region: Secondary | ICD-10-CM | POA: Diagnosis not present

## 2021-07-28 MED ORDER — METHYLPREDNISOLONE ACETATE 80 MG/ML IJ SUSP
80.0000 mg | Freq: Once | INTRAMUSCULAR | Status: AC
  Administered 2021-07-28: 80 mg

## 2021-07-28 NOTE — Patient Instructions (Signed)

## 2021-07-28 NOTE — Progress Notes (Signed)
Pt state lower back pain that travels down his right leg to his ankle. Pt state his buttocks and left leg has started to cause some pain. Pt state standing and sitting makes the pain worse. Pt state he takes pain meds to help ease his pain.  Numeric Pain Rating Scale and Functional Assessment Average Pain 8   In the last MONTH (on 0-10 scale) has pain interfered with the following?  1. General activity like being  able to carry out your everyday physical activities such as walking, climbing stairs, carrying groceries, or moving a chair?  Rating(10)   +Driver, -BT, -Dye Allergies.

## 2021-08-01 ENCOUNTER — Encounter: Payer: Self-pay | Admitting: Physical Therapy

## 2021-08-01 ENCOUNTER — Other Ambulatory Visit: Payer: Self-pay

## 2021-08-01 ENCOUNTER — Ambulatory Visit: Payer: Medicare PPO | Admitting: Physical Therapy

## 2021-08-01 DIAGNOSIS — M5441 Lumbago with sciatica, right side: Secondary | ICD-10-CM

## 2021-08-01 DIAGNOSIS — M6281 Muscle weakness (generalized): Secondary | ICD-10-CM | POA: Diagnosis not present

## 2021-08-01 DIAGNOSIS — G8929 Other chronic pain: Secondary | ICD-10-CM | POA: Diagnosis not present

## 2021-08-01 DIAGNOSIS — M5442 Lumbago with sciatica, left side: Secondary | ICD-10-CM | POA: Diagnosis not present

## 2021-08-01 NOTE — Therapy (Signed)
OUTPATIENT PHYSICAL THERAPY THORACOLUMBAR EVALUATION Referring diagnosis?  G57.01 (ICD-10-CM) - Piriformis syndrome, right M79.18 (ICD-10-CM) - Myofascial pain syndrome Treatment diagnosis? 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 []  Surgery []  Fall [x]  Ongoing issue []  Arthritis []  Other: ____________  Laterality: []  Rt []  Lt [x]  Both  Check all possible CPT codes:  *CHOOSE 10 OR LESS*    [x]  97110 (Therapeutic Exercise)  []  92507 (SLP Treatment)  [x]  97112 (Neuro Re-ed)   []  92526 (Swallowing Treatment)   [x]  95621 (Gait Training)   []  [id number] (Cognitive Training, 1st 15 minutes) [x]  97140 (Manual Therapy)   []  97130 (Cognitive Training, each add'l 15 minutes)  [x]  97530 (Therapeutic Activities)  []  Other, List CPT Code ____________    [x]  30865 (Self Care)       [x]  All codes above (97110 - 97535)  [x]  97012 (Mechanical Traction)  [x]  97014 (E-stim Unattended)  []  97032 (E-stim manual)  [x]  97033 (Ionto)  [x]  97035 (Ultrasound)  []  97760 (Orthotic Fit) []  97750 (Physical Performance Training) [x]  [id number] (Aquatic Therapy) []  97034 (Contrast Bath) []  97018 (Paraffin) []  97597 (Wound Care 1st 20 sq cm) []  97598 (Wound Care each add'l 20 sq cm) [x]  97016 (Vasopneumatic Device) []  [id number] (Orthotic Training) []  [id number] (Prosthetic Training)    Patient Name: John Mcclain MRN: [id number] DOB:[birth year], 66 y.o., male Today's Date: 08/01/2021   PT End of Session - 08/01/21 1537     Visit Number 1    Number of Visits 12    Date for PT Re-Evaluation 09/12/21    PT Start Time 1345    PT Stop Time 1400    PT Time Calculation (min) 15 min    Activity Tolerance Patient tolerated treatment well    Behavior During Therapy [facility] for tasks assessed/performed             Past Medical History:  Diagnosis Date   Anxiety    Arthritis     Chronic migraine    Depression    Diabetes mellitus without complication (HCC)    Heart murmur    Hypertension    Kidney stones    Raynaud's disease    Past Surgical History:  Procedure Laterality Date   COLON SURGERY     LYMPH NODE DISSECTION     removed lymph node   VASECTOMY     Patient Active Problem List   Diagnosis Date Noted   Lumbar radiculopathy 05/03/2020   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   Spondylosis without myelopathy or radiculopathy, lumbar region 05/03/2020   Hypogonadism male 12/26/2011   Vitamin D deficiency 12/26/2011   Rheumatoid arthritis (HCC) 12/26/2011   Migraine headache disorder 12/26/2011   Anxiety disorder 12/26/2011   HTN (hypertension), benign 12/26/2011    PCP: Georgina Quint, MD  REFERRING PROVIDER: Tyrell Antonio, MD  REFERRING DIAG:  G57.01 (ICD-10-CM) - Piriformis syndrome, right M79.18 (ICD-10-CM) - Myofascial pain syndrome  THERAPY DIAG:  Chronic bilateral low back pain with bilateral sciatica  Muscle weakness (generalized)  ONSET DATE: A year ago  SUBJECTIVE:  SUBJECTIVE STATEMENT: Pt reports to PT with low back pain that has been persistent for over a year. Pt states this low back pain is accompanied by "shooting pain" down both LE's. He says he experiences the pain more frequently on the Rt side now than the Lt. He originally felt this back pain when working in the yard last year, and has continued to have difficulties since then. Pt has had previous PT and dry needling performed before, which helped relieve his pain.  PERTINENT HISTORY:  Rheumatoid arthritis (HCC), Anxiety disorder, HTN (hypertension), benign, Lumbar radiculopathy, Spinal stenosis of lumbar region with neurogenic claudication,  Spondylosis without myelopathy or radiculopathy, lumbar region  PAIN:  Are you having pain? Yes NPRS scale: 5-6/10 (with rest), 10/10 (with activity) Pain location: Low back Pain orientation: Bilateral  PAIN TYPE: dull, tight, and tingling Pain description: intermittent  Aggravating factors: Certain movements, sitting for long periods of time  Relieving factors: Injections helped slightly, stretching, pain medication, heat   PRECAUTIONS: None  WEIGHT BEARING RESTRICTIONS No  FALLS:  Has patient fallen in last 6 months? No, Number of falls: 0  OCCUPATION: Retired Oceanographer   PLOF: Independent  PATIENT GOALS : Decrease pain, return to yard work activities    OBJECTIVE:   DIAGNOSTIC FINDINGS:   MRI of the lumbar spine:   1. 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 Careful correlation with this numbering strategy prior to any procedural intervention would be recommended. 2. Multilevel degenerative changes of the lumbar spine with moderate to severe spinal canal stenosis at L3-4 and moderate spinal canal stenosis at L2-3 and L4-5. 3. Multilevel neural foraminal narrowing, severe on the left at L2-3, bilaterally at L3-4 and on the right at L4-5. 4. Mild chronic compression fracture of L4 with approximately 30% loss of vertebral body height.  PATIENT SURVEYS:  FOTO 50%  SCREENING FOR RED FLAGS: Bowel or bladder incontinence: No Spinal tumors: No Cauda equina syndrome: No Compression fracture: No Abdominal aneurysm: No  COGNITION:  Overall cognitive status: Within functional limits for tasks assessed     SENSATION:  Light touch: Deficits- States that there is numbness over the anterior thigh   MUSCLE LENGTH: Hamstrings: Right ~65 deg; Left ~65 deg Thomas test: Right WFL deg; Left WFL deg   PALPATION: Tenderness to palpation noted at bilat low back (more so on the Rt side), bilat piriformis tightness/  tenderness  LUMBARAROM/PROM  A/PROM AROM  08/01/2021  Flexion Full/ pain-free  Extension Full/ pain at end range  Right lateral flexion 75%/ pain- free  Left lateral flexion 75%/ pain at end range  Right rotation Full/ pain- free  Left rotation 75%/ pain at end range   LE MMT:  MMT Right 08/01/2021 Left 08/01/2021  Hip flexion 4+ 4  Hip extension 4 4  Hip abduction 4 4  Hip adduction 3+ 3+  Hip internal rotation 4 (with pain)  4  Hip external rotation 4+ 4+    LUMBAR SPECIAL TESTS: (Bilateral)  Straight leg raise test: Positive, Slump test: Negative, and Thomas test: Negative(modified supine with leg off table)    TODAY'S TREATMENT  Reviewed pt's HEP below Manual therapy: active compression and skilled palpation with DN to Rt glute med,minimus,maximus and piriformis. Twitch responses elicited and good overall tolerance with this. DN performed by Ivery Quale, PT,DPT.   PATIENT EDUCATION:  Education details: HEP Person educated: Patient Education method: Programmer, multimedia, Facilities manager, Verbal cues, and Handouts Education comprehension: verbalized understanding, returned demonstration, verbal  cues required, and needs further education   HOME EXERCISE PROGRAM: Access Code: [email or web address] URL: [email or web address] Date: 08/01/2021 Prepared by: Ivery Quale  Exercises Supine Piriformis Stretch with Foot on Ground - 2 x daily - 6 x weekly - 3 sets - 30 hold Supine Hamstring Stretch with Strap - 2 x daily - 6 x weekly - 3 sets - 3 reps - 30 sec hold Supine Bridge with Mini Swiss Ball Between Knees - 2 x daily - 6 x weekly - 2-3 sets - 10 reps Cat to Child's Pose with Posterior Pelvic Tilt - 2 x daily - 6 x weekly - 2-3 sets - 10 reps Standing Hip Abduction with Resistance at Ankles and Counter Support - 2 x daily - 6 x weekly - 2-3 sets - 10 reps Standing Hip Extension with Resistance at Ankles and Counter Support - 2 x daily - 6 x weekly - 2-3 sets - 10 reps Seated  Lumbar Flexion Stretch - 2 x daily - 6 x weekly - 1 sets - 10 reps - 10 hold  ASSESSMENT:  CLINICAL IMPRESSION: Patient presents with signs and symptoms consistent with chronic low back pain with bilat sciatica.  Pt's MRI shows "neurogenic claudication as a result of spinal canal stenosis and piriformis syndrome", contributing to the pt's pain. Pt has radicular sx down bilat LE's but more commonly on the Rt side than the Lt side. Piriformis tightness was seen with ER, along with tightness of hamstrings. Pt also demonstrated hip weakness bilat and decreased lumbar ROM in multiple directions. Patient will benefit from skilled PT to address below impairments and improve overall function.  OBJECTIVE IMPAIRMENTS: decreased activity tolerance, difficulty walking, decreased endurance, decreased mobility, decreased ROM, decreased strength, impaired flexibility, impaired LE use, and pain.  ACTIVITY LIMITATIONS: bending, lifting, carry, locomotion, cleaning, community activity, driving  PERSONAL FACTORS: Rheumatoid arthritis (HCC), Anxiety disorder, HTN (hypertension), benign, Lumbar radiculopathy, Spinal stenosis of lumbar region with neurogenic claudication, Spondylosis without myelopathy or radiculopathy, lumbar region   REHAB POTENTIAL: Good  CLINICAL DECISION MAKING: evolving/moderate EVALUATION COMPLEXITY: Moderate    GOALS: Short term PT Goals (target date for Short term goals are 4 weeks 08/29/21) Pt will be I and compliant with HEP. Baseline:  Goal status: New Pt will decrease pain by 25% overall Baseline: Goal status: New  Long term PT goals (target dates for all long term goals are 6 weeks 09/12/21) Pt will improve lumbar ROM to [facility] to improve functional mobility Baseline: Goal status: New Pt will improve  hip strength to at least 4+/5 MMT to improve functional strength Baseline: Goal status: New Pt will improve FOTO to at least 55% functional to show improved  function Baseline: Goal status: New Pt will reduce pain to overall less than 2-3/10 with usual activity and work activity. Baseline: Goal status: New Pt will be able to decrease radicular sx of LE's without complaints. Baseline: Goal status: New  PLAN: PT FREQUENCY: 1-2 times per week   PT DURATION: 6 weeks  PLANNED INTERVENTIONS (unless contraindicated): aquatic PT, cryotherapy, Electrical stimulation, Iontophoresis with 4 mg/ml dexamethasome, Moist heat, traction, Ultrasound, gait training, Therapeutic exercise, balance training, neuromuscular re-education, patient/family education, manual techniques, passive ROM, dry needling, taping, vasopnuematic device, spinal manipulations, joint manipulations  PLAN FOR NEXT SESSION: Reassess pt's HEP and dry needling response, Emphasize flexion exercises    Marney Doctor, Student-PT 08/01/2021, 4:49 PM

## 2021-08-02 NOTE — Addendum Note (Signed)
Addended by: Bekah Igoe Singer on: 6/43/1427 67:01 AM   Modules accepted: Orders

## 2021-08-10 ENCOUNTER — Encounter: Payer: Self-pay | Admitting: Physical Therapy

## 2021-08-10 ENCOUNTER — Other Ambulatory Visit: Payer: Self-pay

## 2021-08-10 ENCOUNTER — Ambulatory Visit (INDEPENDENT_AMBULATORY_CARE_PROVIDER_SITE_OTHER): Payer: Medicare PPO | Admitting: Physical Therapy

## 2021-08-10 DIAGNOSIS — M5442 Lumbago with sciatica, left side: Secondary | ICD-10-CM | POA: Diagnosis not present

## 2021-08-10 DIAGNOSIS — M6281 Muscle weakness (generalized): Secondary | ICD-10-CM

## 2021-08-10 DIAGNOSIS — G8929 Other chronic pain: Secondary | ICD-10-CM | POA: Diagnosis not present

## 2021-08-10 DIAGNOSIS — M5441 Lumbago with sciatica, right side: Secondary | ICD-10-CM

## 2021-08-10 NOTE — Therapy (Signed)
OUTPATIENT PHYSICAL THERAPY TREATMENT NOTE   Patient Name: John Mcclain MRN: [id number] DOB:1955-12-09, 66 y.o., male Today's Date: 08/10/2021  PCP: Horald Pollen, MD REFERRING PROVIDER: Lorine Bears, NP   PT End of Session - 08/10/21 1521     Visit Number 2    Number of Visits 12    Date for PT Re-Evaluation 09/12/21    PT Start Time 9675    PT Stop Time 1519    PT Time Calculation (min) 40 min    Activity Tolerance Patient tolerated treatment well    Behavior During Therapy WFL for tasks assessed/performed             Past Medical History:  Diagnosis Date   Anxiety    Arthritis    Chronic migraine    Depression    Diabetes mellitus without complication ([place])    Heart murmur    Hypertension    Kidney stones    Raynaud's disease    Past Surgical History:  Procedure Laterality Date   COLON SURGERY     LYMPH NODE DISSECTION     removed lymph node   VASECTOMY     Patient Active Problem List   Diagnosis Date Noted   Lumbar radiculopathy 05/03/2020   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   Spondylosis without myelopathy or radiculopathy, lumbar region 05/03/2020   Hypogonadism male 12/26/2011   Vitamin D deficiency 12/26/2011   Rheumatoid arthritis ([place]) 12/26/2011   Migraine headache disorder 12/26/2011   Anxiety disorder 12/26/2011   HTN (hypertension), benign 12/26/2011    PCP: Horald Pollen, MD   REFERRING PROVIDER: Magnus Sinning, MD   REFERRING DIAG:  G57.01 (ICD-10-CM) - Piriformis syndrome, right M79.18 (ICD-10-CM) - Myofascial pain syndrome  THERAPY DIAG:  Chronic bilateral low back pain with bilateral sciatica  Muscle weakness (generalized)  PERTINENT HISTORY:  Rheumatoid arthritis ([place]), Anxiety disorder, HTN (hypertension), benign, Lumbar radiculopathy, Spinal stenosis of lumbar region with neurogenic claudication, Spondylosis without myelopathy or radiculopathy, lumbar  region  Subjective statement.  He says he has been doing HEP and has overall less pain from last time. He does have question about one of the HEP exercises.    PAIN:  Are you having pain? Yes NPRS scale: 2/10 Pain location: Low back and piriformis Pain orientation: Bilateral  PAIN TYPE: dull, tight, and tingling Pain description: intermittent  Aggravating factors: Certain movements, sitting for long periods of time  Relieving factors: Injections helped slightly, stretching, pain medication, heat    PRECAUTIONS: None   PATIENT GOALS : Decrease pain, return to yard work activities      OBJECTIVE:    DIAGNOSTIC FINDINGS:    MRI of the lumbar spine:    1. 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 Careful correlation with this numbering strategy prior to any procedural intervention would be recommended. 2. Multilevel degenerative changes of the lumbar spine with moderate to severe spinal canal stenosis at L3-4 and moderate spinal canal stenosis at L2-3 and L4-5. 3. Multilevel neural foraminal narrowing, severe on the left at L2-3, bilaterally at L3-4 and on the right at L4-5. 4. Mild chronic compression fracture of L4 with approximately 30% loss of vertebral body height.   PATIENT SURVEYS:  FOTO 50%    MUSCLE LENGTH: Hamstrings: Right ~65 deg; Left ~65 deg Marcello Moores test: Right WFL deg; Left WFL deg     PALPATION: Tenderness to palpation noted at bilat low back (more so on  the Rt side), bilat piriformis tightness/ tenderness   LUMBARAROM/PROM   A/PROM AROM  08/01/2021  Flexion Full/ pain-free  Extension Full/ pain at end range  Right lateral flexion 75%/ pain- free  Left lateral flexion 75%/ pain at end range  Right rotation Full/ pain- free  Left rotation 75%/ pain at end range    LE MMT:   MMT Right 08/01/2021 Left 08/01/2021  Hip flexion 4+ 4  Hip extension 4 4  Hip abduction 4 4  Hip adduction 3+ 3+  Hip internal  rotation 4 (with pain)  4  Hip external rotation 4+ 4+      LUMBAR SPECIAL TESTS: (Bilateral)  Straight leg raise test: Positive, Slump test: Negative, and Thomas test: Negative(modified supine with leg off table)       TODAY'S TREATMENT  08/10/21 Therapeutic Exercise:  Aerobic: Supine:DKTC stretch with feet on pball 10 sec X10, bridges with feet on pball  sidelying:hip abd 2X10 bilat  Seated:bilat hip IR and ER with green X15 ea. Pball roll outs into flexion stretch 10 sec X10  Standing: Neuromuscular Re-education: Manual Therapy:: active compression and skilled palpation with DN to Rt glute med,minimus,maximus and piriformis. Twitch responses elicited and good overall tolerance with this. DN performed by Elsie Ra, PT,DPT.   Reviewed pt's HEP below   PATIENT EDUCATION:  Education details: HEP Person educated: Patient Education method: Consulting civil engineer, Media planner, Verbal cues, and Handouts Education comprehension: verbalized understanding, returned demonstration, verbal cues required, and needs further education     HOME EXERCISE PROGRAM: Access Code: [email or web address] URL: [email or web address] Date: 08/01/2021 Prepared by: Elsie Ra   Exercises Supine Piriformis Stretch with Foot on Ground - 2 x daily - 6 x weekly - 3 sets - 30 hold Supine Hamstring Stretch with Strap - 2 x daily - 6 x weekly - 3 sets - 3 reps - 30 sec hold Supine Bridge with Mini Swiss Ball Between Knees - 2 x daily - 6 x weekly - 2-3 sets - 10 reps Cat to Child's Pose with Posterior Pelvic Tilt - 2 x daily - 6 x weekly - 2-3 sets - 10 reps Standing Hip Abduction with Resistance at Ankles and Counter Support - 2 x daily - 6 x weekly - 2-3 sets - 10 reps Standing Hip Extension with Resistance at Ankles and Counter Support - 2 x daily - 6 x weekly - 2-3 sets - 10 reps Seated Lumbar Flexion Stretch - 2 x daily - 6 x weekly - 1 sets - 10 reps - 10 hold   ASSESSMENT:   CLINICAL IMPRESSION: We  reviewed HEP and modified the instructions for the child pose stretch he was confused about. We then progressed his overall hip strengthening and lumbar flexion program with good response and treated with DN to piriformis and glutes as he had good response from this last time.  OBJECTIVE IMPAIRMENTS: decreased activity tolerance, difficulty walking, decreased endurance, decreased mobility, decreased ROM, decreased strength, impaired flexibility, impaired LE use, and pain.   ACTIVITY LIMITATIONS: bending, lifting, carry, locomotion, cleaning, community activity, driving   PERSONAL FACTORS: Rheumatoid arthritis ([place]), Anxiety disorder, HTN (hypertension), benign, Lumbar radiculopathy, Spinal stenosis of lumbar region with neurogenic claudication, Spondylosis without myelopathy or radiculopathy, lumbar region     REHAB POTENTIAL: Good   CLINICAL DECISION MAKING: evolving/moderate EVALUATION COMPLEXITY: Moderate       GOALS: Short term PT Goals (target date for Short term goals are 4 weeks 08/29/21) Pt will be I and compliant with HEP. Baseline:  Goal status: New Pt will decrease pain by 25% overall Baseline: Goal status: New   Long term PT goals (target dates for all long term goals are 6 weeks 09/12/21) Pt will improve lumbar ROM to [facility] to improve functional mobility Baseline: Goal status: New Pt will improve  hip strength to at least 4+/5 MMT to improve functional strength Baseline: Goal status: New Pt will improve FOTO to at least 55% functional to show improved function Baseline: Goal status: New Pt will reduce pain to overall less than 2-3/10 with usual activity and work activity. Baseline: Goal status: New Pt will be able to decrease radicular sx of LE's without complaints. Baseline: Goal status: New   PLAN: PT FREQUENCY: 1-2 times per week    PT DURATION: 6 weeks   PLANNED INTERVENTIONS (unless contraindicated): aquatic PT, cryotherapy, Electrical stimulation,  Iontophoresis with 4 mg/ml dexamethasome, Moist heat, traction, Ultrasound, gait training, Therapeutic exercise, balance training, neuromuscular re-education, patient/family education, manual techniques, passive ROM, dry needling, taping, vasopnuematic device, spinal manipulations, joint manipulations   PLAN FOR NEXT SESSION: progress hip strength and how was dry needling response, Emphasize flexion exercises         Debbe Odea, PT,DPT 08/10/2021, 3:21 PM

## 2021-08-14 NOTE — Procedures (Signed)
Lumbar Epidural Steroid Injection - Interlaminar Approach with Fluoroscopic Guidance  Patient: John Mcclain      Date of Birth: Aug 03, 1955 MRN: [id number] PCP: Horald Pollen, MD      Visit Date: 07/28/2021   Universal Protocol:     Consent Given By: the patient  Position: PRONE  Additional Comments: Vital signs were monitored before and after the procedure. Patient was prepped and draped in the usual sterile fashion. The correct patient, procedure, and site was verified.   Injection Procedure Details:   Procedure diagnoses: Lumbar radiculopathy [M54.16]   Meds Administered:  Meds ordered this encounter  Medications   methylPREDNISolone acetate (DEPO-MEDROL) injection 80 mg     Laterality: Right  Location/Site:  L4-5 patient has a sacralized L5 segment.  Needle: 3.5 in., 20 ga. Tuohy  Needle Placement: Paramedian epidural  Findings:   -Comments: Excellent flow of contrast into the epidural space.   Procedure Details: Using a paramedian approach from the side mentioned above, the region overlying the inferior lamina was localized under fluoroscopic visualization and the soft tissues overlying this structure were infiltrated with 4 ml. of 1% Lidocaine without Epinephrine. The Tuohy needle was inserted into the epidural space using a paramedian approach.   The epidural space was localized using loss of resistance along with counter oblique bi-planar fluoroscopic views.  After negative aspirate for air, blood, and CSF, a 2 ml. volume of Isovue-250 was injected into the epidural space and the flow of contrast was observed. Radiographs were obtained for documentation purposes.    The injectate was administered into the level noted above.   Additional Comments:  No complications occurred Dressing: 2 x 2 sterile gauze and Band-Aid    Post-procedure details: Patient was observed during the procedure. Post-procedure instructions were reviewed.  Patient left the  clinic in stable condition.

## 2021-08-14 NOTE — Progress Notes (Signed)
John Mcclain - 66 y.o. male MRN [id number]  Date of birth: 26-Jun-1955  Office Visit Note: Visit Date: 07/28/2021 PCP: Horald Pollen, MD Referred by: Horald Pollen, *  Subjective: Chief Complaint  Patient presents with   Lower Back - Pain   Right Leg - Pain   Right Ankle - Pain   HPI:  John Mcclain is a 66 y.o. male who comes in today at the request of Barnet Pall, FNP for planned Right L4-5 Lumbar Interlaminar epidural steroid injection with fluoroscopic guidance.  The patient has failed conservative care including home exercise, medications, time and activity modification.  This injection will be diagnostic and hopefully therapeutic.  Please see requesting physician notes for further details and justification.  Referring provider: Dr. Legrand Como Hilts  ROS Otherwise per HPI.  Assessment & Plan: Visit Diagnoses:    ICD-10-CM   1. Lumbar radiculopathy  M54.16 XR C-ARM NO REPORT    Epidural Steroid injection    methylPREDNISolone acetate (DEPO-MEDROL) injection 80 mg      Plan: No additional findings.   Meds & Orders:  Meds ordered this encounter  Medications   methylPREDNISolone acetate (DEPO-MEDROL) injection 80 mg    Orders Placed This Encounter  Procedures   XR C-ARM NO REPORT   Epidural Steroid injection    Follow-up: Return if symptoms worsen or fail to improve.   Procedures: No procedures performed  Lumbar Epidural Steroid Injection - Interlaminar Approach with Fluoroscopic Guidance  Patient: John Mcclain      Date of Birth: [birth year] MRN: [id number] PCP: Horald Pollen, MD      Visit Date: 07/28/2021   Universal Protocol:     Consent Given By: the patient  Position: PRONE  Additional Comments: Vital signs were monitored before and after the procedure. Patient was prepped and draped in the usual sterile fashion. The correct patient, procedure, and site was verified.   Injection Procedure Details:   Procedure diagnoses:  Lumbar radiculopathy [M54.16]   Meds Administered:  Meds ordered this encounter  Medications   methylPREDNISolone acetate (DEPO-MEDROL) injection 80 mg     Laterality: Right  Location/Site:  L4-5 patient has a sacralized L5 segment.  Needle: 3.5 in., 20 ga. Tuohy  Needle Placement: Paramedian epidural  Findings:   -Comments: Excellent flow of contrast into the epidural space.   Procedure Details: Using a paramedian approach from the side mentioned above, the region overlying the inferior lamina was localized under fluoroscopic visualization and the soft tissues overlying this structure were infiltrated with 4 ml. of 1% Lidocaine without Epinephrine. The Tuohy needle was inserted into the epidural space using a paramedian approach.   The epidural space was localized using loss of resistance along with counter oblique bi-planar fluoroscopic views.  After negative aspirate for air, blood, and CSF, a 2 ml. volume of Isovue-250 was injected into the epidural space and the flow of contrast was observed. Radiographs were obtained for documentation purposes.    The injectate was administered into the level noted above.   Additional Comments:  No complications occurred Dressing: 2 x 2 sterile gauze and Band-Aid    Post-procedure details: Patient was observed during the procedure. Post-procedure instructions were reviewed.  Patient left the clinic in stable condition.   Clinical History: MRI LUMBAR SPINE WITHOUT CONTRAST   TECHNIQUE: Multiplanar, multisequence MR imaging of the lumbar spine was performed. No intravenous contrast was administered.   COMPARISON:  Plain films November 26, 2019   FINDINGS: Segmentation: A transitional lumbosacral vertebra  is assumed to represent a sacralised L5 level. Careful correlation with this numbering strategy prior to any procedural intervention would be recommended.   Alignment: Dextroconvex scoliosis of the lumbar spine. Grade  1 anterolisthesis of L4 over L5, degenerative. Small retrolisthesis from T12 through L3.   Vertebrae: No acute fracture, evidence of discitis, or bone lesion. Mild chronic compression fracture of L4 with approximately 30% loss of vertebral body height. Endplate degenerative changes at T12-L1, L3-4 and L4-5.   Conus medullaris and cauda equina: Conus extends to the T12-L1 level. Conus and cauda equina appear normal.   Paraspinal and other soft tissues: Negative.   Disc levels:   T12-L1: Disc bulge with associated osteophytic component and facet degenerative changes resulting in narrowing of the bilateral subarticular zones, mild-to-moderate right and moderate left neural foraminal narrowing.   L1-2: Disc bulge, facet degenerative changes and ligamentum flavum redundancy resulting in mild spinal canal stenosis with effacement of the bilateral subarticular zones, mild right and moderate left neural foraminal narrowing.   L2-3: Left asymmetric disc bulge, facet degenerative changes and ligamentum flavum redundancy resulting in moderate spinal canal stenosis with narrowing of the bilateral subarticular zones, moderate right and severe left neural foraminal narrowing.   L3-4: Right asymmetric disc bulge with superimposed central disc extrusion migrating inferiorly, facet degenerative changes and ligamentum flavum redundancy resulting in moderate to severe spinal canal stenosis and severe bilateral neural foraminal.   L4-5: Disc bulge, prominent hypertrophic facet degenerative change ligamentum flavum redundancy resulting in moderate spinal canal stenosis with narrowing of the right subarticular zone and severe right neural foraminal narrowing.   L5-S1: No spinal canal or neural foraminal stenosis.   IMPRESSION: 1. 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 Careful correlation with this numbering strategy prior to any procedural  intervention would be recommended. 2. Multilevel degenerative changes of the lumbar spine with moderate to severe spinal canal stenosis at L3-4 and moderate spinal canal stenosis at L2-3 and L4-5. 3. Multilevel neural foraminal narrowing, severe on the left at L2-3, bilaterally at L3-4 and on the right at L4-5. 4. Mild chronic compression fracture of L4 with approximately 30% loss of vertebral body height.     Electronically Signed   By: Pedro Earls M.D.   On: 03/26/2020 14:46     Objective:  VS:  HT:     WT:    BMI:      BP:133/84   HR:(!) 109bpm   TEMP: ( )   RESP:  Physical Exam   Imaging: No results found.

## 2021-08-15 ENCOUNTER — Other Ambulatory Visit: Payer: Self-pay | Admitting: Emergency Medicine

## 2021-08-15 ENCOUNTER — Other Ambulatory Visit: Payer: Self-pay | Admitting: Gastroenterology

## 2021-08-15 DIAGNOSIS — I10 Essential (primary) hypertension: Secondary | ICD-10-CM

## 2021-08-17 ENCOUNTER — Encounter: Payer: Self-pay | Admitting: Physical Therapy

## 2021-08-17 ENCOUNTER — Other Ambulatory Visit: Payer: Self-pay

## 2021-08-17 ENCOUNTER — Ambulatory Visit: Payer: Medicare PPO | Admitting: Physical Therapy

## 2021-08-17 DIAGNOSIS — G8929 Other chronic pain: Secondary | ICD-10-CM | POA: Diagnosis not present

## 2021-08-17 DIAGNOSIS — M5442 Lumbago with sciatica, left side: Secondary | ICD-10-CM | POA: Diagnosis not present

## 2021-08-17 DIAGNOSIS — M6281 Muscle weakness (generalized): Secondary | ICD-10-CM

## 2021-08-17 DIAGNOSIS — M5441 Lumbago with sciatica, right side: Secondary | ICD-10-CM | POA: Diagnosis not present

## 2021-08-17 NOTE — Therapy (Signed)
?OUTPATIENT PHYSICAL THERAPY TREATMENT NOTE ? ? ?Patient Name: John Mcclain ?MRN: [id number] ?DOB:1955/06/12, 66 y.o., male ?Today's Date: 08/17/2021 ? ?PCP: Horald Pollen, MD ?REFERRING PROVIDER: Lorine Bears, NP ? ? PT End of Session - 08/17/21 1436   ? ? Visit Number 3   ? Number of Visits 12   ? Date for PT Re-Evaluation 09/12/21   ? PT Start Time 1427   ? PT Stop Time 1507   ? PT Time Calculation (min) 40 min   ? Activity Tolerance Patient tolerated treatment well   ? Behavior During Therapy [facility] for tasks assessed/performed   ? ?  ?  ? ?  ? ? ?Past Medical History:  ?Diagnosis Date  ? Anxiety   ? Arthritis   ? Chronic migraine   ? Depression   ? Diabetes mellitus without complication ([place])   ? Heart murmur   ? Hypertension   ? Kidney stones   ? Raynaud's disease   ? ?Past Surgical History:  ?Procedure Laterality Date  ? COLON SURGERY    ? LYMPH NODE DISSECTION    ? removed lymph node  ? VASECTOMY    ? ?Patient Active Problem List  ? Diagnosis Date Noted  ? Lumbar radiculopathy 05/03/2020  ? 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  ? Spondylosis without myelopathy or radiculopathy, lumbar region 05/03/2020  ? Hypogonadism male 12/26/2011  ? Vitamin D deficiency 12/26/2011  ? Rheumatoid arthritis ([place]) 12/26/2011  ? Migraine headache disorder 12/26/2011  ? Anxiety disorder 12/26/2011  ? HTN (hypertension), benign 12/26/2011  ? ? ?PCP: Horald Pollen, MD ?  ?REFERRING PROVIDER: Magnus Sinning, MD ?  ?REFERRING DIAG:  ?G57.01 (ICD-10-CM) - Piriformis syndrome, right ?M79.18 (ICD-10-CM) - Myofascial pain syndrome ? ?THERAPY DIAG:  ?Chronic bilateral low back pain with bilateral sciatica ? ?Muscle weakness (generalized) ? ?PERTINENT HISTORY:  ?Rheumatoid arthritis ([place]), Anxiety disorder, HTN (hypertension), benign, Lumbar radiculopathy, Spinal stenosis of lumbar region with neurogenic claudication, Spondylosis without myelopathy or radiculopathy, lumbar  region ? ?Subjective statement.  He says he had more pain today after riding in the car. Overall his exercises help with the pain and he is having less radicular pain in his legs reported ?  ?PAIN:  ?Are you having pain? Yes ?NPRS scale: 5/10 ?Pain location: Low back and piriformis ?Pain orientation: Bilateral  ?PAIN TYPE: dull, tight, and tingling ?Pain description: intermittent  ?Aggravating factors: Certain movements, sitting for long periods of time  ?Relieving factors: Injections helped slightly, stretching, pain medication, heat  ?  ?PRECAUTIONS: None ?  ?PATIENT GOALS : Decrease pain, return to yard work activities  ?  ?  ?OBJECTIVE:  ?  ?DIAGNOSTIC FINDINGS:  ?  ?MRI of the lumbar spine:  ?  ?1. 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 Careful correlation with this numbering ?strategy prior to any procedural intervention would be recommended. ?2. Multilevel degenerative changes of the lumbar spine with moderate ?to severe spinal canal stenosis at L3-4 and moderate spinal canal ?stenosis at L2-3 and L4-5. ?3. Multilevel neural foraminal narrowing, severe on the left at ?L2-3, bilaterally at L3-4 and on the right at L4-5. ?4. Mild chronic compression fracture of L4 with approximately 30% ?loss of vertebral body height. ?  ?PATIENT SURVEYS:  ?FOTO 50% ? ?  ?MUSCLE LENGTH: ? 08/01/21 ?Hamstrings: Right ~65 deg; Left ~65 deg ?Thomas test: Right WFL deg; Left WFL deg ?  ?  ?LUMBARAROM/PROM ?  ?A/PROM AROM  ?  08/01/2021  ?Flexion Full/ pain-free  ?Extension Full/ pain at end range  ?Right lateral flexion 75%/ pain- free  ?Left lateral flexion 75%/ pain at end range  ?Right rotation Full/ pain- free  ?Left rotation 75%/ pain at end range  ?  ?LE MMT: ?  ?MMT Right ?08/01/2021 Left ?08/01/2021  ?Hip flexion 4+ 4  ?Hip extension 4 4  ?Hip abduction 4 4  ?Hip adduction 3+ 3+  ?Hip internal rotation 4 (with pain)  4  ?Hip external rotation 4+ 4+  ?  ?  ?LUMBAR SPECIAL TESTS:  (Bilateral)  ?Straight leg raise test: Positive, Slump test: Negative, and Thomas test: Negative(modified supine with leg off table) ?  ?  ?  ?TODAY'S TREATMENT  ?08/17/21 ?Therapeutic Exercise: ? Aerobic:Recumbent bike L3 X 6 min ?Supine:DKTC stretch with feet on pball 10 sec X10, bridges with feet on pball 5 sec 2 X10 ?sidelying:hip abd 2X10 bilat ? Seated:bilat hip IR and ER with green X15 ea. Pball roll outs into flexion stretch 5 sec X10 ? Standing: sit to stands with 10# KB 2X10 ? Machines: Leg press DL 100# 2x15 ?Neuromuscular Re-education: ?Manual Therapy:: active compression and skilled palpation with DN to Rt glute med,minimus,maximus and piriformis. Twitch responses elicited and good overall tolerance with this. ? ?08/10/21 ?Therapeutic Exercise: ? Aerobic: ?Supine:DKTC stretch with feet on pball 10 sec X10, bridges with feet on pball  ?sidelying:hip abd 2X10 bilat ? Seated:bilat hip IR and ER with green X15 ea. Pball roll outs into flexion stretch 10 sec X10 ? Standing: ?Neuromuscular Re-education: ?Manual Therapy:: active compression and skilled palpation with DN to Rt glute med,minimus,maximus and piriformis. Twitch responses elicited and good overall tolerance with this. DN performed by Elsie Ra, PT,DPT. ? ?  ?PATIENT EDUCATION:  ?Education details: HEP ?Person educated: Patient ?Education method: Explanation, Demonstration, Verbal cues, and Handouts ?Education comprehension: verbalized understanding, returned demonstration, verbal cues required, and needs further education ?  ?  ?HOME EXERCISE PROGRAM: ?Access Code: [email or web address] ?URL: [email or web address] ?Date: 08/01/2021 ?Prepared by: Elsie Ra ?  ?Exercises ?Supine Piriformis Stretch with Foot on Ground - 2 x daily - 6 x weekly - 3 sets - 30 hold ?Supine Hamstring Stretch with Strap - 2 x daily - 6 x weekly - 3 sets - 3 reps - 30 sec hold ?Supine Bridge with Mini Swiss Ball Between Knees - 2 x daily - 6 x weekly - 2-3 sets - 10  reps ?Cat to Child's Pose with Posterior Pelvic Tilt - 2 x daily - 6 x weekly - 2-3 sets - 10 reps ?Standing Hip Abduction with Resistance at Ankles and Counter Support - 2 x daily - 6 x weekly - 2-3 sets - 10 reps ?Standing Hip Extension with Resistance at Ankles and Counter Support - 2 x daily - 6 x weekly - 2-3 sets - 10 reps ?Seated Lumbar Flexion Stretch - 2 x daily - 6 x weekly - 1 sets - 10 reps - 10 hold ?  ?ASSESSMENT: ?  ?CLINICAL IMPRESSION: We progressed his overall strength program with good tolerance. He had less overall leg pain with lumbar flexion based program and DN which was resumed today. PT recommending to continue POC. ? ?OBJECTIVE IMPAIRMENTS: decreased activity tolerance, difficulty walking, decreased endurance, decreased mobility, decreased ROM, decreased strength, impaired flexibility, impaired LE use, and pain. ?  ?ACTIVITY LIMITATIONS: bending, lifting, carry, locomotion, cleaning, community activity, driving ?  ?PERSONAL FACTORS: Rheumatoid arthritis ([place]), Anxiety disorder, HTN (hypertension), benign, Lumbar radiculopathy, Spinal stenosis of lumbar  region with neurogenic claudication, Spondylosis without myelopathy or radiculopathy, lumbar region ?  ?  ?REHAB POTENTIAL: Good ?  ?CLINICAL DECISION MAKING: evolving/moderate ?EVALUATION COMPLEXITY: Moderate ?  ?  ?  ?GOALS: ?Short term PT Goals (target date for Short term goals are 4 weeks 08/29/21) ?Pt will be I and compliant with HEP. ?Baseline:  ?Goal status: Met ?Pt will decrease pain by 25% overall ?Baseline: ?Goal status: Ongoing ?  ?Long term PT goals (target dates for all long term goals are 6 weeks 09/12/21) ?Pt will improve lumbar ROM to [facility] to improve functional mobility ?Baseline: ?Goal status: Ongoing ?Pt will improve  hip strength to at least 4+/5 MMT to improve functional strength ?Baseline: ?Goal status: Ongoing ?Pt will improve FOTO to at least 55% functional to show improved function ?Baseline: ?Goal status: Ongoing ?Pt  will reduce pain to overall less than 2-3/10 with usual activity and work activity. ?Baseline: ?Goal status: Ongoing ?Pt will be able to decrease radicular sx of LE's without complaints. ?Baseline: ?Goal status: Ongoing ?  ?

## 2021-08-22 DIAGNOSIS — M549 Dorsalgia, unspecified: Secondary | ICD-10-CM | POA: Diagnosis not present

## 2021-08-22 DIAGNOSIS — R768 Other specified abnormal immunological findings in serum: Secondary | ICD-10-CM | POA: Diagnosis not present

## 2021-08-22 DIAGNOSIS — M0609 Rheumatoid arthritis without rheumatoid factor, multiple sites: Secondary | ICD-10-CM | POA: Diagnosis not present

## 2021-08-22 DIAGNOSIS — M1990 Unspecified osteoarthritis, unspecified site: Secondary | ICD-10-CM | POA: Diagnosis not present

## 2021-08-22 DIAGNOSIS — Z79899 Other long term (current) drug therapy: Secondary | ICD-10-CM | POA: Diagnosis not present

## 2021-08-22 DIAGNOSIS — M810 Age-related osteoporosis without current pathological fracture: Secondary | ICD-10-CM | POA: Diagnosis not present

## 2021-08-24 ENCOUNTER — Other Ambulatory Visit: Payer: Self-pay

## 2021-08-24 ENCOUNTER — Ambulatory Visit: Payer: Medicare PPO | Admitting: Physical Therapy

## 2021-08-24 ENCOUNTER — Encounter: Payer: Self-pay | Admitting: Physical Therapy

## 2021-08-24 DIAGNOSIS — M5441 Lumbago with sciatica, right side: Secondary | ICD-10-CM | POA: Diagnosis not present

## 2021-08-24 DIAGNOSIS — G8929 Other chronic pain: Secondary | ICD-10-CM

## 2021-08-24 DIAGNOSIS — M6281 Muscle weakness (generalized): Secondary | ICD-10-CM | POA: Diagnosis not present

## 2021-08-24 DIAGNOSIS — M5442 Lumbago with sciatica, left side: Secondary | ICD-10-CM | POA: Diagnosis not present

## 2021-08-24 NOTE — Therapy (Signed)
?OUTPATIENT PHYSICAL THERAPY TREATMENT NOTE ? ? ?Patient Name: John Mcclain ?MRN: [id number] ?DOB:[birth year], 66 y.o., male ?Today's Date: 08/24/2021 ? ?PCP: Horald Pollen, MD ?REFERRING PROVIDER: Lorine Bears, NP ? ? PT End of Session - 08/24/21 1452   ? ? Visit Number 4   ? Number of Visits 12   ? Date for PT Re-Evaluation 09/12/21   ? PT Start Time 1430   ? PT Stop Time 1510   ? PT Time Calculation (min) 40 min   ? Activity Tolerance Patient tolerated treatment well   ? Behavior During Therapy [facility] for tasks assessed/performed   ? ?  ?  ? ?  ? ? ?Past Medical History:  ?Diagnosis Date  ? Anxiety   ? Arthritis   ? Chronic migraine   ? Depression   ? Diabetes mellitus without complication ([place])   ? Heart murmur   ? Hypertension   ? Kidney stones   ? Raynaud's disease   ? ?Past Surgical History:  ?Procedure Laterality Date  ? COLON SURGERY    ? LYMPH NODE DISSECTION    ? removed lymph node  ? VASECTOMY    ? ?Patient Active Problem List  ? Diagnosis Date Noted  ? Lumbar radiculopathy 05/03/2020  ? 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  ? Spondylosis without myelopathy or radiculopathy, lumbar region 05/03/2020  ? Hypogonadism male 12/26/2011  ? Vitamin D deficiency 12/26/2011  ? Rheumatoid arthritis ([place]) 12/26/2011  ? Migraine headache disorder 12/26/2011  ? Anxiety disorder 12/26/2011  ? HTN (hypertension), benign 12/26/2011  ? ? ?PCP: Horald Pollen, MD ?  ?REFERRING PROVIDER: Magnus Sinning, MD ?  ?REFERRING DIAG:  ?G57.01 (ICD-10-CM) - Piriformis syndrome, right ?M79.18 (ICD-10-CM) - Myofascial pain syndrome ? ?THERAPY DIAG:  ?Chronic bilateral low back pain with bilateral sciatica ? ?Muscle weakness (generalized) ? ?PERTINENT HISTORY:  ?Rheumatoid arthritis ([place]), Anxiety disorder, HTN (hypertension), benign, Lumbar radiculopathy, Spinal stenosis of lumbar region with neurogenic claudication, Spondylosis without myelopathy or radiculopathy, lumbar  region ? ?Subjective statement.  He says about 5/10 pain this morning but heated seats helped in his car.  ?  ?PAIN:  ?Are you having pain? Yes ?NPRS scale: 5/10 ?Pain location: Low back and piriformis ?Pain orientation: Bilateral  ?PAIN TYPE: dull, tight, and tingling ?Pain description: intermittent  ?Aggravating factors: Certain movements, sitting for long periods of time  ?Relieving factors: Injections helped slightly, stretching, pain medication, heat  ?  ?PRECAUTIONS: None ?  ?PATIENT GOALS : Decrease pain, return to yard work activities  ?  ?  ?OBJECTIVE:  ?  ?DIAGNOSTIC FINDINGS:  ?  ?MRI of the lumbar spine:  ?  ?1. 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 Careful correlation with this numbering ?strategy prior to any procedural intervention would be recommended. ?2. Multilevel degenerative changes of the lumbar spine with moderate ?to severe spinal canal stenosis at L3-4 and moderate spinal canal ?stenosis at L2-3 and L4-5. ?3. Multilevel neural foraminal narrowing, severe on the left at ?L2-3, bilaterally at L3-4 and on the right at L4-5. ?4. Mild chronic compression fracture of L4 with approximately 30% ?loss of vertebral body height. ?  ?PATIENT SURVEYS:  ?FOTO 50% ? ?  ?MUSCLE LENGTH: ? 08/01/21 ?Hamstrings: Right ~65 deg; Left ~65 deg ?Thomas test: Right WFL deg; Left WFL deg ?  ?  ?LUMBARAROM/PROM ?  ?A/PROM AROM  ?08/01/2021  ?Flexion Full/ pain-free  ?Extension Full/ pain at end range  ?Right lateral  flexion 75%/ pain- free  ?Left lateral flexion 75%/ pain at end range  ?Right rotation Full/ pain- free  ?Left rotation 75%/ pain at end range  ?  ?LE MMT: ?  ?MMT Right ?08/01/2021 Left ?08/01/2021  ?Hip flexion 4+ 4  ?Hip extension 4 4  ?Hip abduction 4 4  ?Hip adduction 3+ 3+  ?Hip internal rotation 4 (with pain)  4  ?Hip external rotation 4+ 4+  ?  ?  ?LUMBAR SPECIAL TESTS: (Bilateral)  ?Straight leg raise test: Positive, Slump test: Negative, and Thomas test:  Negative(modified supine with leg off table) ?  ?  ?  ?TODAY'S TREATMENT  ?08/24/21 ?Therapeutic Exercise: ? Aerobic:Recumbent bike L3 X 8 min ?Supine:DKTC stretch with feet on pball 10 sec X10, bridges 5 sec 2 X10. Dead bug X 20 bilat ?sidelying:hip abd 2X15 bilat ? Seated:bilat hip IR and ER with green 2X10. Seated clams green 2X15 ? Machines: Leg press DL 100# 2x15 ?Neuromuscular Re-education: ?Manual Therapy:: active compression and skilled palpation with DN to Rt glute med,minimus,maximus, piriformis and TFL. Twitch responses elicited and good overall tolerance with this. ? ?08/17/21 ?Therapeutic Exercise: ? Aerobic:Recumbent bike L3 X 6 min ?Supine:DKTC stretch with feet on pball 10 sec X10, bridges with feet on pball 5 sec 2 X10 ?sidelying:hip abd 2X10 bilat ? Seated:bilat hip IR and ER with green X15 ea. Pball roll outs into flexion stretch 5 sec X10 ? Standing: sit to stands with 10# KB 2X10 ? Machines: Leg press DL 100# 2x15 ?Neuromuscular Re-education: ?Manual Therapy:: active compression and skilled palpation with DN to Rt glute med,minimus,maximus and piriformis. Twitch responses elicited and good overall tolerance with this. ? ? ?  ?PATIENT EDUCATION:  ?Education details: HEP ?Person educated: Patient ?Education method: Explanation, Demonstration, Verbal cues, and Handouts ?Education comprehension: verbalized understanding, returned demonstration, verbal cues required, and needs further education ?  ?  ?HOME EXERCISE PROGRAM: ?Access Code: [email or web address] ?URL: [email or web address] ?Date: 08/01/2021 ?Prepared by: Elsie Ra ?  ?Exercises ?Supine Piriformis Stretch with Foot on Ground - 2 x daily - 6 x weekly - 3 sets - 30 hold ?Supine Hamstring Stretch with Strap - 2 x daily - 6 x weekly - 3 sets - 3 reps - 30 sec hold ?Supine Bridge with Mini Swiss Ball Between Knees - 2 x daily - 6 x weekly - 2-3 sets - 10 reps ?Cat to Child's Pose with Posterior Pelvic Tilt - 2 x daily - 6 x weekly - 2-3  sets - 10 reps ?Standing Hip Abduction with Resistance at Ankles and Counter Support - 2 x daily - 6 x weekly - 2-3 sets - 10 reps ?Standing Hip Extension with Resistance at Ankles and Counter Support - 2 x daily - 6 x weekly - 2-3 sets - 10 reps ?Seated Lumbar Flexion Stretch - 2 x daily - 6 x weekly - 1 sets - 10 reps - 10 hold ?  ?ASSESSMENT: ?  ?CLINICAL IMPRESSION: He is improving in overall hip/lumbar strength and his gait has improved. We continued DN today with good tolerance. PT recommending to continue POC. ? ?OBJECTIVE IMPAIRMENTS: decreased activity tolerance, difficulty walking, decreased endurance, decreased mobility, decreased ROM, decreased strength, impaired flexibility, impaired LE use, and pain. ?  ?ACTIVITY LIMITATIONS: bending, lifting, carry, locomotion, cleaning, community activity, driving ?  ?PERSONAL FACTORS: Rheumatoid arthritis ([place]), Anxiety disorder, HTN (hypertension), benign, Lumbar radiculopathy, Spinal stenosis of lumbar region with neurogenic claudication, Spondylosis without myelopathy or radiculopathy, lumbar region ?  ?  ?REHAB POTENTIAL: Good ?  ?  CLINICAL DECISION MAKING: evolving/moderate ?EVALUATION COMPLEXITY: Moderate ?  ?  ?  ?GOALS: ?Short term PT Goals (target date for Short term goals are 4 weeks 08/29/21) ?Pt will be I and compliant with HEP. ?Baseline:  ?Goal status: Met ?Pt will decrease pain by 25% overall ?Baseline: ?Goal status: Ongoing ?  ?Long term PT goals (target dates for all long term goals are 6 weeks 09/12/21) ?Pt will improve lumbar ROM to [facility] to improve functional mobility ?Baseline: ?Goal status: Ongoing ?Pt will improve  hip strength to at least 4+/5 MMT to improve functional strength ?Baseline: ?Goal status: Ongoing ?Pt will improve FOTO to at least 55% functional to show improved function ?Baseline: ?Goal status: Ongoing ?Pt will reduce pain to overall less than 2-3/10 with usual activity and work activity. ?Baseline: ?Goal status: Ongoing ?Pt will be  able to decrease radicular sx of LE's without complaints. ?Baseline: ?Goal status: Ongoing ?  ?PLAN: ?PT FREQUENCY: 1-2 times per week  ?  ?PT DURATION: 6 weeks ?  ?PLANNED INTERVENTIONS (unless contraindicated):

## 2021-08-31 ENCOUNTER — Encounter: Payer: Self-pay | Admitting: Physical Therapy

## 2021-08-31 ENCOUNTER — Ambulatory Visit: Payer: Medicare PPO | Admitting: Physical Therapy

## 2021-08-31 DIAGNOSIS — G8929 Other chronic pain: Secondary | ICD-10-CM

## 2021-08-31 DIAGNOSIS — M6281 Muscle weakness (generalized): Secondary | ICD-10-CM

## 2021-08-31 DIAGNOSIS — M5441 Lumbago with sciatica, right side: Secondary | ICD-10-CM

## 2021-08-31 DIAGNOSIS — M5442 Lumbago with sciatica, left side: Secondary | ICD-10-CM

## 2021-08-31 NOTE — Therapy (Signed)
?OUTPATIENT PHYSICAL THERAPY TREATMENT NOTE ? ? ?Patient Name: John Mcclain ?MRN: [id number] ?DOB:1956-04-18, 66 y.o., male ?Today's Date: 08/31/2021 ? ?PCP: Horald Pollen, MD ?REFERRING PROVIDER: Lorine Bears, NP ? ? PT End of Session - 08/31/21 1440   ? ? Visit Number 5   ? Number of Visits 12   ? Date for PT Re-Evaluation 09/12/21   ? PT Start Time 1435   ? PT Stop Time 1515   ? PT Time Calculation (min) 40 min   ? Activity Tolerance Patient tolerated treatment well   ? Behavior During Therapy [facility] for tasks assessed/performed   ? ?  ?  ? ?  ? ? ?Past Medical History:  ?Diagnosis Date  ? Anxiety   ? Arthritis   ? Chronic migraine   ? Depression   ? Diabetes mellitus without complication ([place])   ? Heart murmur   ? Hypertension   ? Kidney stones   ? Raynaud's disease   ? ?Past Surgical History:  ?Procedure Laterality Date  ? COLON SURGERY    ? LYMPH NODE DISSECTION    ? removed lymph node  ? VASECTOMY    ? ?Patient Active Problem List  ? Diagnosis Date Noted  ? Lumbar radiculopathy 05/03/2020  ? 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  ? Spondylosis without myelopathy or radiculopathy, lumbar region 05/03/2020  ? Hypogonadism male 12/26/2011  ? Vitamin D deficiency 12/26/2011  ? Rheumatoid arthritis ([place]) 12/26/2011  ? Migraine headache disorder 12/26/2011  ? Anxiety disorder 12/26/2011  ? HTN (hypertension), benign 12/26/2011  ? ? ?PCP: Horald Pollen, MD ?  ?REFERRING PROVIDER: Magnus Sinning, MD ?  ?REFERRING DIAG:  ?G57.01 (ICD-10-CM) - Piriformis syndrome, right ?M79.18 (ICD-10-CM) - Myofascial pain syndrome ? ?THERAPY DIAG:  ?Chronic bilateral low back pain with bilateral sciatica ? ?Muscle weakness (generalized) ? ?PERTINENT HISTORY:  ?Rheumatoid arthritis ([place]), Anxiety disorder, HTN (hypertension), benign, Lumbar radiculopathy, Spinal stenosis of lumbar region with neurogenic claudication, Spondylosis without myelopathy or radiculopathy, lumbar  region ? ?Subjective statement.  He says he has more overall pain over the last 2 days without known cause. The pain is going down his lateral Rt leg to just below his knee.  ?  ?PAIN:  ?Are you having pain? Yes ?NPRS scale: 5/10 ?Pain location: Low back and piriformis ?Pain orientation: Bilateral  ?PAIN TYPE: dull, tight, and tingling ?Pain description: intermittent  ?Aggravating factors: Certain movements, sitting for long periods of time  ?Relieving factors: Injections helped slightly, stretching, pain medication, heat  ?  ?PRECAUTIONS: None ?  ?PATIENT GOALS : Decrease pain, return to yard work activities  ?  ?  ?OBJECTIVE:  ?  ?DIAGNOSTIC FINDINGS:  ?  ?MRI of the lumbar spine:  ?  ?1. 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 Careful correlation with this numbering ?strategy prior to any procedural intervention would be recommended. ?2. Multilevel degenerative changes of the lumbar spine with moderate ?to severe spinal canal stenosis at L3-4 and moderate spinal canal ?stenosis at L2-3 and L4-5. ?3. Multilevel neural foraminal narrowing, severe on the left at ?L2-3, bilaterally at L3-4 and on the right at L4-5. ?4. Mild chronic compression fracture of L4 with approximately 30% ?loss of vertebral body height. ?  ?PATIENT SURVEYS:  ?FOTO 50% ? ?  ?MUSCLE LENGTH: ? 08/01/21 ?Hamstrings: Right ~65 deg; Left ~65 deg ?Thomas test: Right WFL deg; Left WFL deg ?  ?  ?LUMBARAROM/PROM ?  ?A/PROM AROM  ?  08/01/2021 AROM ?3/39/23  ?Flexion Full/ pain-free WNL  ?Extension Full/ pain at end range WNL  ?Right lateral flexion 75%/ pain- free   ?Left lateral flexion 75%/ pain at end range   ?Right rotation Full/ pain- free   ?Left rotation 75%/ pain at end range   ?  ?LE MMT: ?  ?MMT Right ?08/01/2021 Left ?08/01/2021  ?Hip flexion 4+ 4  ?Hip extension 4 4  ?Hip abduction 4 4  ?Hip adduction 3+ 3+  ?Hip internal rotation 4 (with pain)  4  ?Hip external rotation 4+ 4+  ?  ?  ?LUMBAR SPECIAL  TESTS: (Bilateral)  ?Straight leg raise test: Positive, Slump test: Negative, and Thomas test: Negative(modified supine with leg off table) ?  ?  ?  ?TODAY'S TREATMENT  ?08/31/21 ?Therapeutic Exercise: ?Aerobic:Nu step L6 X 6 min UE/LE ?Supine: ?sidelying: ?Seated:lumbar flexion stretch 10# holding 5 seconds then moving into lumbar extension strengthening X 10 reps. Piriformis stretch 30 sec X 2 on right holding 30 sec ? ?Modalities: Mechanical lumbar traction 75-60# intermittent program X 17 min total with set up ? ?08/24/21 ?Therapeutic Exercise: ? Aerobic:Recumbent bike L3 X 8 min ?Supine:DKTC stretch with feet on pball 10 sec X10, bridges 5 sec 2 X10. Dead bug X 20 bilat ?sidelying:hip abd 2X15 bilat ? Seated:bilat hip IR and ER with green 2X10. Seated clams green 2X15 ? Machines: Leg press DL 100# 2x15 ?Neuromuscular Re-education: ?Manual Therapy:: active compression and skilled palpation with DN to Rt glute med,minimus,maximus, piriformis and TFL. Twitch responses elicited and good overall tolerance with this. ? ?08/17/21 ?Therapeutic Exercise: ? Aerobic:Recumbent bike L3 X 6 min ?Supine:DKTC stretch with feet on pball 10 sec X10, bridges with feet on pball 5 sec 2 X10 ?sidelying:hip abd 2X10 bilat ? Seated:bilat hip IR and ER with green X15 ea. Pball roll outs into flexion stretch 5 sec X10 ? Standing: sit to stands with 10# KB 2X10 ? Machines: Leg press DL 100# 2x15 ?Neuromuscular Re-education: ?Manual Therapy:: active compression and skilled palpation with DN to Rt glute med,minimus,maximus and piriformis. Twitch responses elicited and good overall tolerance with this. ? ? ?  ?PATIENT EDUCATION:  ?Education details: HEP ?Person educated: Patient ?Education method: Explanation, Demonstration, Verbal cues, and Handouts ?Education comprehension: verbalized understanding, returned demonstration, verbal cues required, and needs further education ?  ?  ?HOME EXERCISE PROGRAM: ?Access Code: [email or web address] ?URL:  [email or web address] ?Date: 08/01/2021 ?Prepared by: Elsie Ra ?  ?Exercises ?Supine Piriformis Stretch with Foot on Ground - 2 x daily - 6 x weekly - 3 sets - 30 hold ?Supine Hamstring Stretch with Strap - 2 x daily - 6 x weekly - 3 sets - 3 reps - 30 sec hold ?Supine Bridge with Mini Swiss Ball Between Knees - 2 x daily - 6 x weekly - 2-3 sets - 10 reps ?Cat to Child's Pose with Posterior Pelvic Tilt - 2 x daily - 6 x weekly - 2-3 sets - 10 reps ?Standing Hip Abduction with Resistance at Ankles and Counter Support - 2 x daily - 6 x weekly - 2-3 sets - 10 reps ?Standing Hip Extension with Resistance at Ankles and Counter Support - 2 x daily - 6 x weekly - 2-3 sets - 10 reps ?Seated Lumbar Flexion Stretch - 2 x daily - 6 x weekly - 1 sets - 10 reps - 10 hold ?  ?ASSESSMENT: ?  ?CLINICAL IMPRESSION: He was having more overall lumbar radiculopathy noted into his right leg so we trialed mechanical traction  today so see if this will improve this. This did appear to centralize his pain some. I encouraged him to set up to 3 more appointments up as this was the last one he had scheduled.  ? ?OBJECTIVE IMPAIRMENTS: decreased activity tolerance, difficulty walking, decreased endurance, decreased mobility, decreased ROM, decreased strength, impaired flexibility, impaired LE use, and pain. ?  ?ACTIVITY LIMITATIONS: bending, lifting, carry, locomotion, cleaning, community activity, driving ?  ?PERSONAL FACTORS: Rheumatoid arthritis ([place]), Anxiety disorder, HTN (hypertension), benign, Lumbar radiculopathy, Spinal stenosis of lumbar region with neurogenic claudication, Spondylosis without myelopathy or radiculopathy, lumbar region ?  ?  ?REHAB POTENTIAL: Good ?  ?CLINICAL DECISION MAKING: evolving/moderate ?EVALUATION COMPLEXITY: Moderate ?  ?  ?  ?GOALS: ?Short term PT Goals (target date for Short term goals are 4 weeks 08/29/21) ?Pt will be I and compliant with HEP. ?Baseline:  ?Goal status: Met ?Pt will  decrease pain by 25% overall ?Baseline: ?Goal status: Ongoing ?  ?Long term PT goals (target dates for all long term goals are 6 weeks 09/12/21) ?Pt will improve lumbar ROM to [facility] to improve functional mobility ?Baseline: ?Goal s

## 2021-09-10 ENCOUNTER — Telehealth: Payer: Medicare PPO | Admitting: Nurse Practitioner

## 2021-09-10 DIAGNOSIS — U071 COVID-19: Secondary | ICD-10-CM

## 2021-09-10 MED ORDER — MOLNUPIRAVIR EUA 200MG CAPSULE: 4.0000 | ORAL_CAPSULE | Freq: Two times a day (BID) | ORAL | 0 refills | Status: AC

## 2021-09-10 NOTE — Progress Notes (Signed)
? ?Virtual Visit Consent  ? ?[name], you are scheduled for a virtual visit with Mary-Margaret Hassell Done, [place], a [facility] provider, today.   ?  ?Just as with appointments in the office, your consent must be obtained to participate.  Your consent will be active for this visit and any virtual visit you may have with one of our providers in the next 365 days.   ?  ?If you have a MyChart account, a copy of this consent can be sent to you electronically.  All virtual visits are billed to your insurance company just like a traditional visit in the office.   ? ?As this is a virtual visit, video technology does not allow for your provider to perform a traditional examination.  This may limit your provider's ability to fully assess your condition.  If your provider identifies any concerns that need to be evaluated in person or the need to arrange testing (such as labs, EKG, etc.), we will make arrangements to do so.   ?  ?Although advances in technology are sophisticated, we cannot ensure that it will always work on either your end or our end.  If the connection with a video visit is poor, the visit may have to be switched to a telephone visit.  With either a video or telephone visit, we are not always able to ensure that we have a secure connection.    ? ?I need to obtain your verbal consent now.   Are you willing to proceed with your visit today? YES ?  ?Nivaan Dicenzo has provided verbal consent on 09/10/2021 for a virtual visit (video or telephone). ?  ?Mary-Margaret Hassell Done, FNP  ? ?Date: 09/10/2021 7:15 PM ? ? ?Virtual Visit via Video Note  ? ?I, Mary-Margaret Hassell Done, connected with Hobie Kohles ([id number], 1956/03/06) on 09/10/21 at  7:15 PM EDT by a video-enabled telemedicine application and verified that I am speaking with the correct person using two identifiers. ? ?Location: ?Patient: Virtual Visit Location Patient: Home ?Provider: Virtual Visit Location Provider: Mobile ?  ?I discussed the limitations of  evaluation and management by telemedicine and the availability of in person appointments. The patient expressed understanding and agreed to proceed.   ? ?History of Present Illness: ?John Mcclain is a 66 y.o. who identifies as a male who was assigned male at birth, and is being seen today for covid positive. ? ?HPI: URI  ?This is a new problem. The current episode started yesterday. The problem has been gradually worsening. The maximum temperature recorded prior to his arrival was 100.4 - 100.9 F. The fever has been present for 1 to 2 days. Associated symptoms include congestion, coughing, headaches, nausea, rhinorrhea and a sore throat. He has tried decongestant for the symptoms. The treatment provided mild relief.   Tested positive for covid this eveninng ?Review of Systems  ?Constitutional:  Positive for chills and fever.  ?HENT:  Positive for congestion, rhinorrhea and sore throat.   ?Respiratory:  Positive for cough.   ?Gastrointestinal:  Positive for nausea.  ?Musculoskeletal:  Negative for myalgias.  ?Neurological:  Positive for headaches.  ? ?Problems:  ?Patient Active Problem List  ? Diagnosis Date Noted  ? Lumbar radiculopathy 05/03/2020  ? 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  ? Spondylosis without myelopathy or radiculopathy, lumbar region 05/03/2020  ? Hypogonadism male 12/26/2011  ? Vitamin D deficiency 12/26/2011  ? Rheumatoid arthritis ([place]) 12/26/2011  ? Migraine headache disorder 12/26/2011  ? Anxiety disorder 12/26/2011  ?  HTN (hypertension), benign 12/26/2011  ?  ?Allergies:  ?Allergies  ?Allergen Reactions  ? Penicillins   ? ?Medications:  ?Current Outpatient Medications:  ?  alendronate (FOSAMAX) 70 MG tablet, Take 70 mg by mouth once a week. Take with a full glass of water on an empty stomach., Disp: , Rfl:  ?  amLODipine (NORVASC) 2.5 MG tablet, TAKE 1 TABLET EVERY DAY (NEED TO SCHEDULE A 6 MONTH FOLLOW UP APPOINTMENT), Disp: 90 tablet, Rfl: 1 ?  Calcium  Carbonate-Vitamin D (CALCIUM 600+D PO), Take by mouth daily., Disp: , Rfl:  ?  clobetasol (OLUX) 0.05 % topical foam, APPLY TOPICALLY TWICE DAILY, Disp: 50 g, Rfl: 0 ?  cyclobenzaprine (FLEXERIL) 10 MG tablet, Take 10 mg by mouth 3 (three) times daily as needed., Disp: , Rfl:  ?  folic acid (FOLVITE) 1 MG tablet, Take 1 mg by mouth daily., Disp: , Rfl:  ?  Fremanezumab-vfrm (AJOVY) 225 MG/1.5ML SOAJ, Inject 1.5 mLs into the skin every 30 (thirty) days., Disp: , Rfl:  ?  levETIRAcetam (KEPPRA) 750 MG tablet, Take 750 mg by mouth 2 (two) times daily. , Disp: , Rfl:  ?  lisinopril (ZESTRIL) 10 MG tablet, TAKE 1 TABLET EVERY DAY, Disp: 90 tablet, Rfl: 3 ?  LORazepam (ATIVAN) 1 MG tablet, TAKE 1/2 TO 1 TABLET EVERY DAY AS NEEDED FOR ANXIETY. USE AS SPARINGLY AS POSSIBLE, Disp: 30 tablet, Rfl: 2 ?  Magnesium 400 MG TABS, Take 1 tablet by mouth daily as needed., Disp: , Rfl:  ?  minocycline (MINOCIN) 50 MG capsule, TAKE 1 CAPSULE TWICE DAILY, Disp: 120 capsule, Rfl: 1 ?  naproxen sodium (ALEVE) 220 MG tablet, , Disp: , Rfl:  ?  pantoprazole (PROTONIX) 40 MG tablet, Take 1 tablet (40 mg total) by mouth daily., Disp: 90 tablet, Rfl: 3 ?  polyethylene glycol powder (GLYCOLAX/MIRALAX) powder, Take 17 g by mouth daily., Disp: 3350 g, Rfl: prn ?  predniSONE (DELTASONE) 10 MG tablet, Take 10 mg by mouth daily as needed., Disp: , Rfl:  ?  rosuvastatin (CRESTOR) 5 MG tablet, TAKE 1 TABLET EVERY DAY, Disp: 90 tablet, Rfl: 3 ?  tadalafil (CIALIS) 5 MG tablet, Take 1 tablet (5 mg total) by mouth daily as needed for erectile dysfunction., Disp: 90 tablet, Rfl: 2 ?  Upadacitinib ER 15 MG TB24, Take by mouth daily., Disp: , Rfl:  ? ?Observations/Objective: ?Patient is well-developed, well-nourished in no acute distress.  ?Resting comfortably  at home.  ?Head is normocephalic, atraumatic.  ?No labored breathing.  ?Speech is clear and coherent with logical content.  ?Patient is alert and oriented at baseline.  ?Raspy voice ?Dry  cough ? ?Assessment and Plan: ? ?[name] in today with chief complaint of Covid Positive ? ? ?1. Positive self-administered antigen test for COVID-19 ?1. Take meds as prescribed ?2. Use a cool mist humidifier especially during the winter months and when heat has been humid. ?3. Use saline nose sprays frequently ?4. Saline irrigations of the nose can be very helpful if done frequently. ? * 4X daily for 1 week* ? * Use of a nettie pot can be helpful with this. Follow directions with this* ?5. Drink plenty of fluids ?6. Keep thermostat turn down low ?7.For any cough or congestion- mucinex ?8. For fever or aces or pains- take tylenol or ibuprofen appropriate for age and weight. ? * for fevers greater than 101 orally you may alternate ibuprofen and tylenol every  3 hours. ?  ? ?Meds ordered this encounter  ?  Medications  ? molnupiravir EUA (LAGEVRIO) 200 mg CAPS capsule  ?  Sig: Take 4 capsules (800 mg total) by mouth 2 (two) times daily for 5 days.  ?  Dispense:  40 capsule  ?  Refill:  0  ?  Order Specific Question:   Supervising Provider  ?  Answer:   Noemi Chapel [[id number]]  ? ? ? ? ? ?Follow Up Instructions: ?I discussed the assessment and treatment plan with the patient. The patient was provided an opportunity to ask questions and all were answered. The patient agreed with the plan and demonstrated an understanding of the instructions.  A copy of instructions were sent to the patient via MyChart. ? ?The patient was advised to call back or seek an in-person evaluation if the symptoms worsen or if the condition fails to improve as anticipated. ? ?Time:  ?I spent 10 minutes with the patient via telehealth technology discussing the above problems/concerns.   ? ?Mary-Margaret Hassell Done, FNP ? ?

## 2021-09-10 NOTE — Patient Instructions (Signed)
COVID-19: Quarantine and Isolation ?Quarantine ?If you were exposed ?Quarantine and stay away from others when you have been in close contact with someone who has COVID-19. ?Isolate ?If you are sick or test positive ?Isolate when you are sick or when you have COVID-19, even if you don't have symptoms. ?When to stay home ?Calculating quarantine ?The date of your exposure is considered day 0. Day 1 is the first full day after your last contact with a person who has had COVID-19. Stay home and away from other people for at least 5 days. Learn why CDC updated guidance for the general public. ?IF YOU were exposed to COVID-19 and are NOT  ?up to dateIF YOU were exposed to COVID-19 and are NOT on COVID-19 vaccinations ?Quarantine for at least 5 days ?Stay home ?Stay home and quarantine for at least 5 full days. ?Wear a well-fitting mask if you must be around others in your home. ?Do not travel. ?Get tested ?Even if you don't develop symptoms, get tested at least 5 days after you last had close contact with someone with COVID-19. ?After quarantine ?Watch for symptoms ?Watch for symptoms until 10 days after you last had close contact with someone with COVID-19. ?Avoid travel ?It is best to avoid travel until a full 10 days after you last had close contact with someone with COVID-19. ?If you develop symptoms ?Isolate immediately and get tested. Continue to stay home until you know the results. Wear a well-fitting mask around others. ?Take precautions until day 10 ?Wear a well-fitting mask ?Wear a well-fitting mask for 10 full days any time you are around others inside your home or in public. Do not go to places where you are unable to wear a well-fitting mask. ?If you must travel during days 6-10, take precautions. ?Avoid being around people who are more likely to get very sick from COVID-19. ?IF YOU were exposed to COVID-19 and are  ?up to dateIF YOU were exposed to COVID-19 and are on COVID-19 vaccinations ?No  quarantine ?You do not need to stay home unless you develop symptoms. ?Get tested ?Even if you don't develop symptoms, get tested at least 5 days after you last had close contact with someone with COVID-19. ?Watch for symptoms ?Watch for symptoms until 10 days after you last had close contact with someone with COVID-19. ?If you develop symptoms ?Isolate immediately and get tested. Continue to stay home until you know the results. Wear a well-fitting mask around others. ?Take precautions until day 10 ?Wear a well-fitting mask ?Wear a well-fitting mask for 10 full days any time you are around others inside your home or in public. Do not go to places where you are unable to wear a well-fitting mask. ?Take precautions if traveling ?Avoid being around people who are more likely to get very sick from COVID-19. ?IF YOU were exposed to COVID-19 and had confirmed COVID-19 within the past 90 days (you tested positive using a viral test) ?No quarantine ?You do not need to stay home unless you develop symptoms. ?Watch for symptoms ?Watch for symptoms until 10 days after you last had close contact with someone with COVID-19. ?If you develop symptoms ?Isolate immediately and get tested. Continue to stay home until you know the results. Wear a well-fitting mask around others. ?Take precautions until day 10 ?Wear a well-fitting mask ?Wear a well-fitting mask for 10 full days any time you are around others inside your home or in public. Do not go to places where you are  unable to wear a well-fitting mask. ?Take precautions if traveling ?Avoid being around people who are more likely to get very sick from COVID-19. ?Calculating isolation ?Day 0 is your first day of symptoms or a positive viral test. Day 1 is the first full day after your symptoms developed or your test specimen was collected. If you have COVID-19 or have symptoms, isolate for at least 5 days. ?IF YOU tested positive for COVID-19 or have symptoms, regardless of  vaccination status ?Stay home for at least 5 days ?Stay home for 5 days and isolate from others in your home. ?Wear a well-fitting mask if you must be around others in your home. ?Do not travel. ?Ending isolation if you had symptoms ?End isolation after 5 full days if you are fever-free for 24 hours (without the use of fever-reducing medication) and your symptoms are improving. ?Ending isolation if you did NOT have symptoms ?End isolation after at least 5 full days after your positive test. ?If you got very sick from COVID-19 or have a weakened immune system ?You should isolate for at least 10 days. Consult your doctor before ending isolation. ?Take precautions until day 10 ?Wear a well-fitting mask ?Wear a well-fitting mask for 10 full days any time you are around others inside your home or in public. Do not go to places where you are unable to wear a well-fitting mask. ?Do not travel ?Do not travel until a full 10 days after your symptoms started or the date your positive test was taken if you had no symptoms. ?Avoid being around people who are more likely to get very sick from COVID-19. ?Definitions ?Exposure ?Contact with someone infected with SARS-CoV-2, the virus that causes COVID-19, in a way that increases the likelihood of getting infected with the virus. ?Close contact ?A close contact is someone who was less than 6 feet away from an infected person (laboratory-confirmed or a clinical diagnosis) for a cumulative total of 15 minutes or more over a 24-hour period. For example, three individual 5-minute exposures for a total of 15 minutes. People who are exposed to someone with COVID-19 after they completed at least 5 days of isolation are not considered close contacts. ?Quarantine ?John Mcclain is a strategy used to prevent transmission of COVID-19 by keeping people who have been in close contact with someone with COVID-19 apart from others. ?Who does not need to quarantine? ?If you had close contact with  someone with COVID-19 and you are in one of the following groups, you do not need to quarantine. ?You are up to date with your COVID-19 vaccines. ?You had confirmed COVID-19 within the last 90 days (meaning you tested positive using a viral test). ?If you are up to date with COVID-19 vaccines, you should wear a well-fitting mask around others for 10 days from the date of your last close contact with someone with COVID-19 (the date of last close contact is considered day 0). Get tested at least 5 days after you last had close contact with someone with COVID-19. If you test positive or develop COVID-19 symptoms, isolate from other people and follow recommendations in the Isolation section below. If you tested positive for COVID-19 with a viral test within the previous 90 days and subsequently recovered and remain without COVID-19 symptoms, you do not need to quarantine or get tested after close contact. You should wear a well-fitting mask around others for 10 days from the date of your last close contact with someone with COVID-19 (the date of last  close contact is considered day 0). If you have COVID-19 symptoms, get tested and isolate from other people and follow recommendations in the Isolation section below. ?Who should quarantine? ?If you come into close contact with someone with COVID-19, you should quarantine if you are not up to date on COVID-19 vaccines. This includes people who are not vaccinated. ?What to do for quarantine ?Stay home and away from other people for at least 5 days (day 0 through day 5) after your last contact with a person who has COVID-19. The date of your exposure is considered day 0. Wear a well-fitting mask when around others at home, if possible. ?For 10 days after your last close contact with someone with COVID-19, watch for fever (100.4?F or greater), cough, shortness of breath, or other COVID-19 symptoms. ?If you develop symptoms, get tested immediately and isolate until you receive  your test results. If you test positive, follow isolation recommendations. ?If you do not develop symptoms, get tested at least 5 days after you last had close contact with someone with COVID-19. ?If you test negative, you c

## 2021-09-13 ENCOUNTER — Telehealth: Payer: Self-pay | Admitting: Emergency Medicine

## 2021-09-13 NOTE — Telephone Encounter (Signed)
Agree with advice. Thanks.

## 2021-09-13 NOTE — Telephone Encounter (Signed)
Connected to [facility] 4.8.2023. ? ?Caller states he tested positive for COVID today and he has a fever (broke out into a sweat but didn't test it) and body aches and he passed out hit the floor, did not hit head and stated his knees buckled and he had a ?witnessed fall that he went down really slow. ? ?Advised to go to ED.  ?

## 2021-09-15 ENCOUNTER — Encounter: Payer: Medicare PPO | Admitting: Physical Therapy

## 2021-09-15 ENCOUNTER — Telehealth: Payer: Self-pay | Admitting: Physical Medicine and Rehabilitation

## 2021-09-15 NOTE — Telephone Encounter (Signed)
Patient called needing to schedule an appointment with Dr. Ernestina Patches for his back. The number to contact patient is [phone] ?

## 2021-09-20 ENCOUNTER — Other Ambulatory Visit: Payer: Self-pay | Admitting: Physical Medicine and Rehabilitation

## 2021-09-20 ENCOUNTER — Telehealth: Payer: Self-pay | Admitting: Physical Medicine and Rehabilitation

## 2021-09-20 DIAGNOSIS — M5416 Radiculopathy, lumbar region: Secondary | ICD-10-CM

## 2021-09-20 NOTE — Progress Notes (Signed)
Spoke with patient this afternoon, he would like injection ASAP due to severe pain. Referral placed for right L4-L5 interlaminar epidural steroid injection at [facility].  ?

## 2021-09-20 NOTE — Telephone Encounter (Signed)
Mr. Mabee is calling to f/u on scheduling for a injection in his back. He was told it could be until May before any appointments opened up but Mr. Gallick feels he needs alternative treatments until then. Please advise.  ?

## 2021-09-21 ENCOUNTER — Other Ambulatory Visit: Payer: Self-pay | Admitting: Physical Medicine and Rehabilitation

## 2021-09-21 DIAGNOSIS — M5416 Radiculopathy, lumbar region: Secondary | ICD-10-CM

## 2021-09-22 ENCOUNTER — Encounter: Payer: Medicare PPO | Admitting: Physical Therapy

## 2021-09-26 ENCOUNTER — Ambulatory Visit
Admission: RE | Admit: 2021-09-26 | Discharge: 2021-09-26 | Disposition: A | Discharge status: Home / Self Care | Payer: Medicare PPO | Source: Ambulatory Visit | Attending: Physical Medicine and Rehabilitation | Admitting: Physical Medicine and Rehabilitation

## 2021-09-26 DIAGNOSIS — M545 Low back pain, unspecified: Secondary | ICD-10-CM | POA: Diagnosis not present

## 2021-09-26 DIAGNOSIS — M5416 Radiculopathy, lumbar region: Secondary | ICD-10-CM

## 2021-09-26 MED ORDER — METHYLPREDNISOLONE ACETATE 40 MG/ML INJ SUSP (RADIOLOG
80.0000 mg | Freq: Once | INTRAMUSCULAR | Status: AC
  Administered 2021-09-26: 80 mg via EPIDURAL

## 2021-09-26 MED ORDER — IOPAMIDOL (ISOVUE-M 200) INJECTION 41%
1.0000 mL | Freq: Once | INTRAMUSCULAR | Status: AC
  Administered 2021-09-26: 1 mL via EPIDURAL

## 2021-09-26 NOTE — Discharge Instructions (Signed)

## 2021-09-29 ENCOUNTER — Ambulatory Visit: Payer: Medicare PPO | Admitting: Physical Therapy

## 2021-09-29 ENCOUNTER — Encounter: Payer: Self-pay | Admitting: Physical Therapy

## 2021-09-29 DIAGNOSIS — G8929 Other chronic pain: Secondary | ICD-10-CM | POA: Diagnosis not present

## 2021-09-29 DIAGNOSIS — M5441 Lumbago with sciatica, right side: Secondary | ICD-10-CM

## 2021-09-29 DIAGNOSIS — M6281 Muscle weakness (generalized): Secondary | ICD-10-CM | POA: Diagnosis not present

## 2021-09-29 DIAGNOSIS — M5442 Lumbago with sciatica, left side: Secondary | ICD-10-CM

## 2021-09-29 NOTE — Therapy (Addendum)
OUTPATIENT PHYSICAL THERAPY TREATMENT NOTE/Discharge PHYSICAL THERAPY DISCHARGE SUMMARY  Visits from Start of Care: 6  Current functional level related to goals / functional outcomes: See below   Remaining deficits: See below   Education / Equipment: HEP, referred him back to MD  Plan:  Patient is being discharged due to lack of progress and or pain reduction and will be referred back to MD  Elsie Ra, PT, DPT 11/08/21 2:18 PM      Patient Name: John Mcclain MRN: [id number] DOB:1956/05/15, 66 y.o., male Today's Date: 09/29/2021  PCP: Horald Pollen, MD REFERRING PROVIDER: Lorine Bears, NP   PT End of Session - 09/29/21 1313     Visit Number 6    Number of Visits 12    Date for PT Re-Evaluation 09/12/21    PT Start Time 1303    PT Stop Time 1345    PT Time Calculation (min) 42 min    Activity Tolerance Patient tolerated treatment well    Behavior During Therapy [facility] for tasks assessed/performed             Past Medical History:  Diagnosis Date   Anxiety    Arthritis    Chronic migraine    Depression    Diabetes mellitus without complication ([place])    Heart murmur    Hypertension    Kidney stones    Raynaud's disease    Past Surgical History:  Procedure Laterality Date   COLON SURGERY     LYMPH NODE DISSECTION     removed lymph node   VASECTOMY     Patient Active Problem List   Diagnosis Date Noted   Lumbar radiculopathy 05/03/2020   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   Spondylosis without myelopathy or radiculopathy, lumbar region 05/03/2020   Hypogonadism male 12/26/2011   Vitamin D deficiency 12/26/2011   Rheumatoid arthritis ([place]) 12/26/2011   Migraine headache disorder 12/26/2011   Anxiety disorder 12/26/2011   HTN (hypertension), benign 12/26/2011    PCP: Horald Pollen, MD   REFERRING PROVIDER: Magnus Sinning, MD   REFERRING DIAG:  G57.01 (ICD-10-CM) - Piriformis  syndrome, right M79.18 (ICD-10-CM) - Myofascial pain syndrome  THERAPY DIAG:  Chronic bilateral low back pain with bilateral sciatica  Muscle weakness (generalized)  PERTINENT HISTORY:  Rheumatoid arthritis ([place]), Anxiety disorder, HTN (hypertension), benign, Lumbar radiculopathy, Spinal stenosis of lumbar region with neurogenic claudication, Spondylosis without myelopathy or radiculopathy, lumbar region  Subjective statement.  He says he has more overall pain since getting covid. He had Rt lumbar/hip injection Monday but says it has not helped yet. He did not feel mechanical traction helped any last time. He does say if he can pop his back it feels better PAIN:  Are you having pain? Yes NPRS scale: 9/10 Pain location: Low back and piriformis Pain orientation: Bilateral  PAIN TYPE: dull, tight, and tingling Pain description: intermittent  Aggravating factors: Certain movements, sitting for long periods of time  Relieving factors: Injections helped slightly, stretching, pain medication, heat    PRECAUTIONS: None   PATIENT GOALS : Decrease pain, return to yard work activities      OBJECTIVE:    DIAGNOSTIC FINDINGS:    MRI of the lumbar spine:    1. 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 Careful correlation with this numbering strategy prior to any procedural intervention would be recommended. 2. Multilevel degenerative changes of the lumbar spine with moderate to severe spinal  canal stenosis at L3-4 and moderate spinal canal stenosis at L2-3 and L4-5. 3. Multilevel neural foraminal narrowing, severe on the left at L2-3, bilaterally at L3-4 and on the right at L4-5. 4. Mild chronic compression fracture of L4 with approximately 30% loss of vertebral body height.   PATIENT SURVEYS:  FOTO 50%    MUSCLE LENGTH:  08/01/21 Hamstrings: Right ~65 deg; Left ~65 deg Marcello Moores test: Right WFL deg; Left WFL deg     LUMBARAROM/PROM    A/PROM AROM  08/01/2021 AROM 3/39/23 AROM 09/29/21  Flexion Full/ pain-free WNL 50% limited by pain  Extension Full/ pain at end range WNL 50% limited by pain  Right lateral flexion 75%/ pain- free    Left lateral flexion 75%/ pain at end range    Right rotation Full/ pain- free    Left rotation 75%/ pain at end range      LE MMT:   MMT Right 08/01/2021 Left 08/01/2021  Hip flexion 4+ 4  Hip extension 4 4  Hip abduction 4 4  Hip adduction 3+ 3+  Hip internal rotation 4 (with pain)  4  Hip external rotation 4+ 4+      LUMBAR SPECIAL TESTS: (Bilateral)  Straight leg raise test: Positive, Slump test: Negative, and Thomas test: Negative(modified supine with leg off table)       TODAY'S TREATMENT  09/29/21 Sci fit bike UE/LE L3 X 5 min Seated lumbar flexion stretch p ball roll outs X 10 fwd, and X 10 diagonal to left Seated piriformis stretch on Rt figure 4 and leaning fwd 5 sec X10 Seated slump stretch on Rt 3 sec X 10.  Supine DKTC stretch with feet on red ball X10 Supine bridges X10 Supine LTR 5 sec X10 bilat Left leg long axis distraction 5 min SL thoracic rotational mobs grade 4  08/31/21 Therapeutic Exercise: Aerobic:Nu step L6 X 6 min UE/LE Supine: sidelying: Seated:lumbar flexion stretch 10# holding 5 seconds then moving into lumbar extension strengthening X 10 reps. Piriformis stretch 30 sec X 2 on right holding 30 sec  Modalities: Mechanical lumbar traction 75-60# intermittent program X 17 min total with set up  08/24/21 Therapeutic Exercise:  Aerobic:Recumbent bike L3 X 8 min Supine:DKTC stretch with feet on pball 10 sec X10, bridges 5 sec 2 X10. Dead bug X 20 bilat sidelying:hip abd 2X15 bilat  Seated:bilat hip IR and ER with green 2X10. Seated clams green 2X15  Machines: Leg press DL 100# 2x15 Neuromuscular Re-education: Manual Therapy:: active compression and skilled palpation with DN to Rt glute med,minimus,maximus, piriformis and TFL. Twitch responses  elicited and good overall tolerance with this.  08/17/21 Therapeutic Exercise:  Aerobic:Recumbent bike L3 X 6 min Supine:DKTC stretch with feet on pball 10 sec X10, bridges with feet on pball 5 sec 2 X10 sidelying:hip abd 2X10 bilat  Seated:bilat hip IR and ER with green X15 ea. Pball roll outs into flexion stretch 5 sec X10  Standing: sit to stands with 10# KB 2X10  Machines: Leg press DL 100# 2x15 Neuromuscular Re-education: Manual Therapy:: active compression and skilled palpation with DN to Rt glute med,minimus,maximus and piriformis. Twitch responses elicited and good overall tolerance with this.     PATIENT EDUCATION:  Education details: HEP Person educated: Patient Education method: Consulting civil engineer, Media planner, Verbal cues, and Handouts Education comprehension: verbalized understanding, returned demonstration, verbal cues required, and needs further education     HOME EXERCISE PROGRAM: Access Code: [email or web address] URL: [email or web address] Date: 08/01/2021 Prepared by: Elsie Ra  Exercises Supine Piriformis Stretch with Foot on Ground - 2 x daily - 6 x weekly - 3 sets - 30 hold Supine Hamstring Stretch with Strap - 2 x daily - 6 x weekly - 3 sets - 3 reps - 30 sec hold Supine Bridge with Mini Swiss Ball Between Knees - 2 x daily - 6 x weekly - 2-3 sets - 10 reps Cat to Child's Pose with Posterior Pelvic Tilt - 2 x daily - 6 x weekly - 2-3 sets - 10 reps Standing Hip Abduction with Resistance at Ankles and Counter Support - 2 x daily - 6 x weekly - 2-3 sets - 10 reps Standing Hip Extension with Resistance at Ankles and Counter Support - 2 x daily - 6 x weekly - 2-3 sets - 10 reps Seated Lumbar Flexion Stretch - 2 x daily - 6 x weekly - 1 sets - 10 reps - 10 hold   ASSESSMENT:   CLINICAL IMPRESSION: He was in overall more pain since having COVID. He did have lumbar injection but this has not helped and he did not get significant pain relief with session today.  Surgery may be indicated so I recommended he continue to follow up with MD about this.   OBJECTIVE IMPAIRMENTS: decreased activity tolerance, difficulty walking, decreased endurance, decreased mobility, decreased ROM, decreased strength, impaired flexibility, impaired LE use, and pain.   ACTIVITY LIMITATIONS: bending, lifting, carry, locomotion, cleaning, community activity, driving   PERSONAL FACTORS: Rheumatoid arthritis ([place]), Anxiety disorder, HTN (hypertension), benign, Lumbar radiculopathy, Spinal stenosis of lumbar region with neurogenic claudication, Spondylosis without myelopathy or radiculopathy, lumbar region     REHAB POTENTIAL: Good   CLINICAL DECISION MAKING: evolving/moderate EVALUATION COMPLEXITY: Moderate       GOALS: Short term PT Goals (target date for Short term goals are 4 weeks 08/29/21) Pt will be I and compliant with HEP. Baseline:  Goal status: Met Pt will decrease pain by 25% overall Baseline: Goal status: Ongoing   Long term PT goals (target dates for all long term goals are 6 weeks 09/12/21) Pt will improve lumbar ROM to [facility] to improve functional mobility Baseline: Goal status: Ongoing Pt will improve  hip strength to at least 4+/5 MMT to improve functional strength Baseline: Goal status: Ongoing Pt will improve FOTO to at least 55% functional to show improved function Baseline: Goal status: Ongoing Pt will reduce pain to overall less than 2-3/10 with usual activity and work activity. Baseline: Goal status: Ongoing Pt will be able to decrease radicular sx of LE's without complaints. Baseline: Goal status: Ongoing   PLAN: PT FREQUENCY: 1-2 times per week    PT DURATION: 6 weeks   PLANNED INTERVENTIONS (unless contraindicated): aquatic PT, cryotherapy, Electrical stimulation, Iontophoresis with 4 mg/ml dexamethasome, Moist heat, traction, Ultrasound, gait training, Therapeutic exercise, balance training, neuromuscular re-education, patient/family  education, manual techniques, passive ROM, dry needling, taping, vasopnuematic device, spinal manipulations, joint manipulations   PLAN FOR NEXT SESSION: hold PT for now as progress has stopped. Recommended to meet with back doctor to see if he needs surgery   Debbe Odea, PT,DPT 09/29/2021, 1:14 PM

## 2021-10-05 ENCOUNTER — Telehealth: Payer: Self-pay | Admitting: Physical Medicine and Rehabilitation

## 2021-10-05 NOTE — Telephone Encounter (Signed)
Please call the pt -he has not had any relief from the injection that he had  ?

## 2021-10-07 ENCOUNTER — Encounter: Payer: Self-pay | Admitting: Physical Medicine and Rehabilitation

## 2021-10-07 ENCOUNTER — Ambulatory Visit: Payer: Medicare PPO | Admitting: Physical Medicine and Rehabilitation

## 2021-10-07 DIAGNOSIS — M4316 Spondylolisthesis, lumbar region: Secondary | ICD-10-CM

## 2021-10-07 DIAGNOSIS — M47816 Spondylosis without myelopathy or radiculopathy, lumbar region: Secondary | ICD-10-CM

## 2021-10-07 DIAGNOSIS — M48062 Spinal stenosis, lumbar region with neurogenic claudication: Secondary | ICD-10-CM | POA: Diagnosis not present

## 2021-10-07 DIAGNOSIS — M5116 Intervertebral disc disorders with radiculopathy, lumbar region: Secondary | ICD-10-CM

## 2021-10-07 DIAGNOSIS — M5416 Radiculopathy, lumbar region: Secondary | ICD-10-CM | POA: Diagnosis not present

## 2021-10-07 MED ORDER — PREGABALIN 75 MG PO CAPS: ORAL_CAPSULE | ORAL | 0 refills | Status: DC

## 2021-10-07 NOTE — Progress Notes (Signed)
? ?John Mcclain - 66 y.o. male MRN [id number]  Date of birth: [birth year] ? ?Office Visit Note: ?Visit Date: 10/07/2021 ?PCP: Horald Pollen, MD ?Referred by: Horald Pollen, * ? ?Subjective: ?Chief Complaint  ?Patient presents with  ? Lower Back - Follow-up  ? Previous Injection  ? ?HPI: John Mcclain is a 66 y.o. male who comes in today for evaluation chronic, worsening and severe right-sided buttock pain radiating down right lateral leg to ankle.  Patient reports pain has been ongoing for several years and is exacerbated by prolonged standing and walking.  He describes his pain as a constant sore, tingling and burning sensation, currently rates pain as 8 out of 10.  Patient reports some relief of pain with home exercise regimen, use of heating pad, rest and medications.  Patient has recently completed a regimen of formal physical therapy with our in-house team and reports some relief of pain with these treatments. Patients lumbar MRI from 2021 exhibits transitional lumbosacral vertebrae that is assumed to represent a sacralized L5 level, moderate to severe spinal canal stenosis at L3-L4, moderate spinal canal stenosis at L2-L3 and L4-L5. There is also prominent facet hypertrophy at the level of L4-L5. Patient has had a number of lumbar epidural steroid injections performed in our office over the years, most of them providing short term relief of pain for 6 weeks.  Patient recently had right L4-L5 interlaminar epidural steroid injection performed at [facility], he reports little to no relief of pain with this procedure.  Patient denies focal weakness.  Patient denies recent trauma or falls. ? ?Review of Systems  ?Musculoskeletal:  Positive for back pain.  ?Neurological:  Positive for tingling. Negative for focal weakness and weakness.  ?All other systems reviewed and are negative. Otherwise per HPI. ? ?Assessment & Plan: ?Visit Diagnoses:  ?  ICD-10-CM   ?1. Lumbar radiculopathy  M54.16  Ambulatory referral to Physical Medicine Rehab  ?  Ambulatory referral to Neurosurgery  ?  MR LUMBAR SPINE WO CONTRAST  ?  ?2. Radiculopathy due to lumbar intervertebral disc disorder  M51.16   ?  ?3. Spinal stenosis of lumbar region with neurogenic claudication  M48.062 Ambulatory referral to Physical Medicine Rehab  ?  Ambulatory referral to Neurosurgery  ?  MR LUMBAR SPINE WO CONTRAST  ?  ?4. Spondylolisthesis of lumbar region  M43.16   ?  ?5. Facet hypertrophy of lumbar region  M47.816   ?  ?   ?Plan: Findings:  ?Chronic, worsening and severe right-sided buttock pain radiating down right lateral leg to ankle.  Patient continues to have severe pain despite good conservative therapy such as home exercise regimen, formal physical therapy, rest and use of medications.  Patient clinical presentation and exam are consistent with neurogenic claudication as a result of spinal canal stenosis.  Patient does have moderate to severe spinal canal stenosis noted at the level of L3-L4, there is also moderate spinal canal stenosis noted at the levels of L2-L3 and L4-L5.  I did talk extensively with patient today regarding treatment plan.  He would like to repeat lumbar epidural steroid injection as he did have better results previously when performed in our office.  I did place an order for right L4-L5 interlaminar epidural steroid injection.  Patient is not currently taking anticoagulant medications.  I also talked with patient about surgical consultation, I do feel he would benefit from speaking with physician to discuss options. I did place a referral to Dr. Sherley Bounds at  [facility] for consultation. I also placed orders for both new lumbar MRI imaging and Lyrica today. Patient informed that [facility] will call him to schedule lumbar MRI. Patient instructed to try Lyrica at night for 1 week, then increase to twice a day. We will see patient back for injection and will also follow up after  lumbar MRI imaging is completed and surgical consultation. Patient instructed to let us know if he needs anything. No red flag symptoms noted upon exam.   ? ?Meds & Orders:  ?Meds ordered this encounter  ?Medications  ? pregabalin (LYRICA) 75 MG capsule  ?  Sig: 1 tablet (75 mg) by mouth at bedtime for one week, then twice a day.  ?  Dispense:  60 capsule  ?  Refill:  0  ?  Order Specific Question:   Supervising Provider  ?  AnswerMagnus Sinning [[id number]]  ?  ?Orders Placed This Encounter  ?Procedures  ? MR LUMBAR SPINE WO CONTRAST  ? Ambulatory referral to Physical Medicine Rehab  ? Ambulatory referral to Neurosurgery  ?  ?Follow-up: Return for Right L4-L5 interlaminar epidural steroid injection.  ? ?Procedures: ?No procedures performed  ?   ? ?Clinical History: ?MRI LUMBAR SPINE WITHOUT CONTRAST ?  ?TECHNIQUE: ?Multiplanar, multisequence MR imaging of the lumbar spine was ?performed. No intravenous contrast was administered. ?  ?COMPARISON:  Plain films November 26, 2019 ?  ?FINDINGS: ?Segmentation: A transitional lumbosacral vertebra is assumed to ?represent a sacralised L5 level. Careful correlation with this ?numbering strategy prior to any procedural intervention would be ?recommended. ?  ?Alignment: Dextroconvex scoliosis of the lumbar spine. Grade 1 ?anterolisthesis of L4 over L5, degenerative. Small retrolisthesis ?from T12 through L3. ?  ?Vertebrae: No acute fracture, evidence of discitis, or bone lesion. ?Mild chronic compression fracture of L4 with approximately 30% loss ?of vertebral body height. Endplate degenerative changes at T12-L1, ?L3-4 and L4-5. ?  ?Conus medullaris and cauda equina: Conus extends to the T12-L1 ?level. Conus and cauda equina appear normal. ?  ?Paraspinal and other soft tissues: Negative. ?  ?Disc levels: ?  ?T12-L1: Disc bulge with associated osteophytic component and facet ?degenerative changes resulting in narrowing of the bilateral ?subarticular zones, mild-to-moderate right  and moderate left neural ?foraminal narrowing. ?  ?L1-2: Disc bulge, facet degenerative changes and ligamentum flavum ?redundancy resulting in mild spinal canal stenosis with effacement ?of the bilateral subarticular zones, mild right and moderate left ?neural foraminal narrowing. ?  ?L2-3: Left asymmetric disc bulge, facet degenerative changes and ?ligamentum flavum redundancy resulting in moderate spinal canal ?stenosis with narrowing of the bilateral subarticular zones, ?moderate right and severe left neural foraminal narrowing. ?  ?L3-4: Right asymmetric disc bulge with superimposed central disc ?extrusion migrating inferiorly, facet degenerative changes and ?ligamentum flavum redundancy resulting in moderate to severe spinal ?canal stenosis and severe bilateral neural foraminal. ?  ?L4-5: Disc bulge, prominent hypertrophic facet degenerative change ?ligamentum flavum redundancy resulting in moderate spinal canal ?stenosis with narrowing of the right subarticular zone and severe ?right neural foraminal narrowing. ?  ?L5-S1: No spinal canal or neural foraminal stenosis. ?  ?IMPRESSION: ?1. 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 Careful correlation with this numbering ?strategy prior to any procedural intervention would be recommended. ?2. Multilevel degenerative changes of the lumbar spine with moderate ?to severe spinal canal stenosis at L3-4 and moderate spinal canal ?stenosis at L2-3 and L4-5. ?3. Multilevel neural foraminal narrowing, severe on the  left at ?L2-3, bilaterally at L3-4 and on the right at L4-5. ?4. Mild chronic compression fracture of L4 with approximately 30% ?loss of vertebral body height. ?  ?  ?Electronically Signed ?  By: Pedro Earls M.D. ?  On: 03/26/2020 14:46  ? ?He reports that he has never smoked. He has never used smokeless tobacco.  ?Recent Labs  ?  01/11/21 ?1632  ?HGBA1C 6.1  ? ? ?Objective:  VS:  HT:    WT:   BMI:      BP:   HR: bpm  TEMP: ( )  RESP:  ?Physical Exam ?Vitals and nursing note reviewed.  ?HENT:  ?   Head: Normocephalic and atraumatic.  ?   Right Ear: External ear normal.  ?   Left Ear: External ear normal.

## 2021-10-17 ENCOUNTER — Ambulatory Visit
Admission: RE | Admit: 2021-10-17 | Discharge: 2021-10-17 | Disposition: A | Discharge status: Home / Self Care | Payer: Medicare PPO | Source: Ambulatory Visit | Attending: Physical Medicine and Rehabilitation | Admitting: Physical Medicine and Rehabilitation

## 2021-10-17 DIAGNOSIS — M4126 Other idiopathic scoliosis, lumbar region: Secondary | ICD-10-CM | POA: Diagnosis not present

## 2021-10-17 DIAGNOSIS — M4316 Spondylolisthesis, lumbar region: Secondary | ICD-10-CM | POA: Diagnosis not present

## 2021-10-17 DIAGNOSIS — M545 Low back pain, unspecified: Secondary | ICD-10-CM | POA: Diagnosis not present

## 2021-10-17 DIAGNOSIS — M25551 Pain in right hip: Secondary | ICD-10-CM | POA: Diagnosis not present

## 2021-10-17 DIAGNOSIS — M47816 Spondylosis without myelopathy or radiculopathy, lumbar region: Secondary | ICD-10-CM | POA: Diagnosis not present

## 2021-10-17 DIAGNOSIS — M48061 Spinal stenosis, lumbar region without neurogenic claudication: Secondary | ICD-10-CM | POA: Diagnosis not present

## 2021-10-18 ENCOUNTER — Other Ambulatory Visit: Payer: Self-pay | Admitting: Emergency Medicine

## 2021-10-18 DIAGNOSIS — F419 Anxiety disorder, unspecified: Secondary | ICD-10-CM

## 2021-10-18 DIAGNOSIS — I10 Essential (primary) hypertension: Secondary | ICD-10-CM

## 2021-10-19 ENCOUNTER — Ambulatory Visit: Payer: Medicare PPO | Admitting: Physical Medicine and Rehabilitation

## 2021-10-19 ENCOUNTER — Telehealth: Payer: Self-pay | Admitting: Physical Medicine and Rehabilitation

## 2021-10-19 NOTE — Telephone Encounter (Signed)
Spoke with patient regarding new lumbar MRI findings, worsening of central canal narrowing from previous imaging in 2021. Patient informed, we are waiting on referral to Dr. Sherley Bounds at [facility].  ?

## 2021-11-08 ENCOUNTER — Other Ambulatory Visit: Payer: Self-pay | Admitting: Physical Medicine and Rehabilitation

## 2021-11-10 DIAGNOSIS — M4126 Other idiopathic scoliosis, lumbar region: Secondary | ICD-10-CM | POA: Diagnosis not present

## 2021-11-10 DIAGNOSIS — M4316 Spondylolisthesis, lumbar region: Secondary | ICD-10-CM | POA: Diagnosis not present

## 2021-11-10 DIAGNOSIS — M5416 Radiculopathy, lumbar region: Secondary | ICD-10-CM | POA: Diagnosis not present

## 2021-11-16 ENCOUNTER — Telehealth: Payer: Self-pay | Admitting: Physical Medicine and Rehabilitation

## 2021-11-16 ENCOUNTER — Other Ambulatory Visit: Payer: Self-pay | Admitting: Physical Medicine and Rehabilitation

## 2021-11-16 MED ORDER — PREGABALIN 75 MG PO CAPS: 75.0000 mg | ORAL_CAPSULE | Freq: Two times a day (BID) | ORAL | 2 refills | Status: DC

## 2021-11-16 NOTE — Telephone Encounter (Signed)
Pt called requesting a refill of his lyrica. Please send to pharmacy on file. Pt phone number is [phone].

## 2021-11-21 DIAGNOSIS — G43719 Chronic migraine without aura, intractable, without status migrainosus: Secondary | ICD-10-CM | POA: Diagnosis not present

## 2021-11-23 DIAGNOSIS — M4126 Other idiopathic scoliosis, lumbar region: Secondary | ICD-10-CM | POA: Diagnosis not present

## 2021-12-19 DIAGNOSIS — M4126 Other idiopathic scoliosis, lumbar region: Secondary | ICD-10-CM | POA: Diagnosis not present

## 2022-01-23 ENCOUNTER — Other Ambulatory Visit: Payer: Self-pay | Admitting: Emergency Medicine

## 2022-01-23 DIAGNOSIS — Z299 Encounter for prophylactic measures, unspecified: Secondary | ICD-10-CM

## 2022-01-30 ENCOUNTER — Other Ambulatory Visit: Payer: Self-pay | Admitting: Dermatology

## 2022-01-30 DIAGNOSIS — L739 Follicular disorder, unspecified: Secondary | ICD-10-CM

## 2022-02-22 ENCOUNTER — Other Ambulatory Visit: Payer: Self-pay | Admitting: Physical Medicine and Rehabilitation

## 2022-02-22 ENCOUNTER — Other Ambulatory Visit: Payer: Self-pay | Admitting: Emergency Medicine

## 2022-02-22 DIAGNOSIS — Z79899 Other long term (current) drug therapy: Secondary | ICD-10-CM | POA: Diagnosis not present

## 2022-02-22 DIAGNOSIS — M1990 Unspecified osteoarthritis, unspecified site: Secondary | ICD-10-CM | POA: Diagnosis not present

## 2022-02-22 DIAGNOSIS — R768 Other specified abnormal immunological findings in serum: Secondary | ICD-10-CM | POA: Diagnosis not present

## 2022-02-22 DIAGNOSIS — M0609 Rheumatoid arthritis without rheumatoid factor, multiple sites: Secondary | ICD-10-CM | POA: Diagnosis not present

## 2022-02-22 DIAGNOSIS — M549 Dorsalgia, unspecified: Secondary | ICD-10-CM | POA: Diagnosis not present

## 2022-02-22 DIAGNOSIS — F419 Anxiety disorder, unspecified: Secondary | ICD-10-CM

## 2022-02-22 DIAGNOSIS — M810 Age-related osteoporosis without current pathological fracture: Secondary | ICD-10-CM | POA: Diagnosis not present

## 2022-02-27 DIAGNOSIS — M5416 Radiculopathy, lumbar region: Secondary | ICD-10-CM | POA: Diagnosis not present

## 2022-02-27 DIAGNOSIS — M4316 Spondylolisthesis, lumbar region: Secondary | ICD-10-CM | POA: Diagnosis not present

## 2022-05-08 DIAGNOSIS — M4126 Other idiopathic scoliosis, lumbar region: Secondary | ICD-10-CM | POA: Diagnosis not present

## 2022-05-08 DIAGNOSIS — M4316 Spondylolisthesis, lumbar region: Secondary | ICD-10-CM | POA: Diagnosis not present

## 2022-05-08 DIAGNOSIS — Z6825 Body mass index (BMI) 25.0-25.9, adult: Secondary | ICD-10-CM | POA: Diagnosis not present

## 2022-05-16 DIAGNOSIS — G43019 Migraine without aura, intractable, without status migrainosus: Secondary | ICD-10-CM | POA: Diagnosis not present

## 2022-05-17 ENCOUNTER — Other Ambulatory Visit: Payer: Self-pay | Admitting: Emergency Medicine

## 2022-05-17 ENCOUNTER — Telehealth: Payer: Self-pay | Admitting: Emergency Medicine

## 2022-05-17 DIAGNOSIS — Z299 Encounter for prophylactic measures, unspecified: Secondary | ICD-10-CM

## 2022-05-17 NOTE — Telephone Encounter (Signed)
Form has been given to [place].

## 2022-05-17 NOTE — Telephone Encounter (Signed)
Received Preoperative Clearance Form from [facility] through fax. Form was printed out and placed in Dr. Barry Brunner box at the front.

## 2022-05-18 NOTE — Telephone Encounter (Signed)
Called patient and informed him that an OV is needed before the surgical clearance form can be filled out. Patient is coming in next week for an appt

## 2022-05-22 ENCOUNTER — Telehealth: Payer: Self-pay | Admitting: Emergency Medicine

## 2022-05-22 NOTE — Telephone Encounter (Addendum)
Please schedule AWV when patient comes into the office to see PCP on 05/24/22.

## 2022-05-24 ENCOUNTER — Ambulatory Visit (INDEPENDENT_AMBULATORY_CARE_PROVIDER_SITE_OTHER): Payer: Medicare PPO | Admitting: Emergency Medicine

## 2022-05-24 ENCOUNTER — Encounter: Payer: Self-pay | Admitting: Emergency Medicine

## 2022-05-24 VITALS — BP 142/76 | HR 73 | Temp 98.3°F | Ht 65.0 in | Wt 152.5 lb

## 2022-05-24 DIAGNOSIS — Z01818 Encounter for other preprocedural examination: Secondary | ICD-10-CM | POA: Diagnosis not present

## 2022-05-24 DIAGNOSIS — M48062 Spinal stenosis, lumbar region with neurogenic claudication: Secondary | ICD-10-CM

## 2022-05-24 DIAGNOSIS — M5416 Radiculopathy, lumbar region: Secondary | ICD-10-CM | POA: Diagnosis not present

## 2022-05-24 DIAGNOSIS — G43809 Other migraine, not intractable, without status migrainosus: Secondary | ICD-10-CM

## 2022-05-24 DIAGNOSIS — I10 Essential (primary) hypertension: Secondary | ICD-10-CM

## 2022-05-24 DIAGNOSIS — F419 Anxiety disorder, unspecified: Secondary | ICD-10-CM | POA: Diagnosis not present

## 2022-05-24 DIAGNOSIS — L739 Follicular disorder, unspecified: Secondary | ICD-10-CM | POA: Diagnosis not present

## 2022-05-24 MED ORDER — CLOBETASOL PROPIONATE 0.05 % EX FOAM: Freq: Two times a day (BID) | CUTANEOUS | 1 refills | Status: AC

## 2022-05-24 NOTE — Assessment & Plan Note (Signed)
Well-controlled hypertension. Continue lisinopril 10 mg daily

## 2022-05-24 NOTE — Patient Instructions (Signed)
Health Maintenance After Age 68 After age 68, you are at a higher risk for certain long-term diseases and infections as well as injuries from falls. Falls are a major cause of broken bones and head injuries in people who are older than age 68. Getting regular preventive care can help to keep you healthy and well. Preventive care includes getting regular testing and making lifestyle changes as recommended by your health care provider. Talk with your health care provider about: Which screenings and tests you should have. A screening is a test that checks for a disease when you have no symptoms. A diet and exercise plan that is right for you. What should I know about screenings and tests to prevent falls? Screening and testing are the best ways to find a health problem early. Early diagnosis and treatment give you the best chance of managing medical conditions that are common after age 68. Certain conditions and lifestyle choices may make you more likely to have a fall. Your health care provider may recommend: Regular vision checks. Poor vision and conditions such as cataracts can make you more likely to have a fall. If you wear glasses, make sure to get your prescription updated if your vision changes. Medicine review. Work with your health care provider to regularly review all of the medicines you are taking, including over-the-counter medicines. Ask your health care provider about any side effects that may make you more likely to have a fall. Tell your health care provider if any medicines that you take make you feel dizzy or sleepy. Strength and balance checks. Your health care provider may recommend certain tests to check your strength and balance while standing, walking, or changing positions. Foot health exam. Foot pain and numbness, as well as not wearing proper footwear, can make you more likely to have a fall. Screenings, including: Osteoporosis screening. Osteoporosis is a condition that causes  the bones to get weaker and break more easily. Blood pressure screening. Blood pressure changes and medicines to control blood pressure can make you feel dizzy. Depression screening. You may be more likely to have a fall if you have a fear of falling, feel depressed, or feel unable to do activities that you used to do. Alcohol use screening. Using too much alcohol can affect your balance and may make you more likely to have a fall. Follow these instructions at home: Lifestyle Do not drink alcohol if: Your health care provider tells you not to drink. If you drink alcohol: Limit how much you have to: 0-1 drink a day for women. 0-2 drinks a day for men. Know how much alcohol is in your drink. In the [place] one drink equals one 12 oz bottle of beer (355 mL), one 5 oz glass of wine (148 mL), or one 1 oz glass of hard liquor (44 mL). Do not use any products that contain nicotine or tobacco. These products include cigarettes, chewing tobacco, and vaping devices, such as e-cigarettes. If you need help quitting, ask your health care provider. Activity  Follow a regular exercise program to stay fit. This will help you maintain your balance. Ask your health care provider what types of exercise are appropriate for you. If you need a cane or walker, use it as recommended by your health care provider. Wear supportive shoes that have nonskid soles. Safety  Remove any tripping hazards, such as rugs, cords, and clutter. Install safety equipment such as grab bars in bathrooms and safety rails on stairs. Keep rooms and walkways   well-lit. General instructions Talk with your health care provider about your risks for falling. Tell your health care provider if: You fall. Be sure to tell your health care provider about all falls, even ones that seem minor. You feel dizzy, tiredness (fatigue), or off-balance. Take over-the-counter and prescription medicines only as told by your health care provider. These include  supplements. Eat a healthy diet and maintain a healthy weight. A healthy diet includes low-fat dairy products, low-fat (lean) meats, and fiber from whole grains, beans, and lots of fruits and vegetables. Stay current with your vaccines. Schedule regular health, dental, and eye exams. Summary Having a healthy lifestyle and getting preventive care can help to protect your health and wellness after age 68. Screening and testing are the best way to find a health problem early and help you avoid having a fall. Early diagnosis and treatment give you the best chance for managing medical conditions that are more common for people who are older than age 68. Falls are a major cause of broken bones and head injuries in people who are older than age 68. Take precautions to prevent a fall at home. Work with your health care provider to learn what changes you can make to improve your health and wellness and to prevent falls. This information is not intended to replace advice given to you by your health care provider. Make sure you discuss any questions you have with your health care provider. Document Revised: 10/11/2020 Document Reviewed: 10/11/2020 Elsevier Patient Education  2023 [employer]  

## 2022-05-24 NOTE — Progress Notes (Signed)
Subjective:    John Mcclain is a 66 y.o. male who presents to the office today for a preoperative consultation at the request of surgeon Dr. Pieter Partridge Dawley who plans on performing lumbar fusion soon pending clearance. Planned anesthesia: general. The patient has the following known anesthesia issues:  None . Patients bleeding risk: no recent abnormal bleeding. Patient does not have objections to receiving blood products if needed.  The following portions of the patient's history were reviewed and updated as appropriate: allergies, current medications, past family history, past medical history, past social history, past surgical history, and problem list.  Review of Systems A comprehensive review of systems was negative.    Objective:    BP (!) 160/80   Pulse 73   Temp 98.3 F (36.8 C) (Oral)   Ht '5\' 5"'$  (1.651 m)   Wt 152 lb 8 oz (69.2 kg)   SpO2 99%   BMI 25.38 kg/m   General Appearance:    Alert, cooperative, no distress, appears stated age  Head:    Normocephalic, without obvious abnormality, atraumatic  Eyes:    PERRL, conjunctiva/corneas clear, EOM's intact    Ears:    Normal TM's and external ear canals, both ears  Nose:   Nares normal, septum midline, mucosa normal, no drainage    or sinus tenderness  Throat:   Lips, mucosa, and tongue normal; teeth and gums normal  Neck:   Supple, symmetrical, trachea midline, no adenopathy;       thyroid:  No enlargement/tenderness/nodules; no carotid   bruit or JVD  Back:     Symmetric, no curvature, ROM normal, no CVA tenderness  Lungs:     Clear to auscultation bilaterally, respirations unlabored  Chest wall:    No tenderness or deformity  Heart:    Regular rate and rhythm, S1 and S2 normal, no murmur, rub   or gallop  Abdomen:     Soft, non-tender, bowel sounds active all four quadrants,    no masses, no organomegaly        Extremities:   Extremities normal, atraumatic, no cyanosis or edema  Pulses:   2+ and symmetric all extremities   Skin:   Skin color, texture, turgor normal, no rashes or lesions  Lymph nodes:   Cervical, supraclavicular, and axillary nodes normal  Neurologic:   CNII-XII intact. Normal strength, sensation and reflexes      throughout    Predictors of intubation difficulty:  Morbid obesity? no  Anatomically abnormal facies? no  Prominent incisors? no  Receding mandible? no  Short, thick neck? no  Neck range of motion: normal   Cardiographics ECG: normal sinus rhythm, no blocks or conduction defects, no ischemic changes Echocardiogram: not done  Imaging Chest x-ray: Not done  Lab Review  Unremarkable with acceptable values   Assessment:      66 y.o. male with planned surgery as above.   Known risk factors for perioperative complications: None   Difficulty with intubation is not anticipated.  Cardiac Risk Estimation: Low Problem List Items Addressed This Visit       Cardiovascular and Mediastinum   Migraine headache disorder    Stable and well-controlled.      HTN (hypertension), benign    Well-controlled hypertension. Continue lisinopril 10 mg daily        Nervous and Auditory   Lumbar radiculopathy    Intermittent symptoms. Scheduled for surgery        Other   Anxiety disorder    Stable on  current medications.      Spinal stenosis of lumbar region with neurogenic claudication    Follows up with orthopedist on a regular basis Scheduled for surgery      Other Visit Diagnoses     Preop examination    -  Primary   Relevant Orders   Comprehensive metabolic panel   CBC with Differential/Platelet   Hemoglobin A1c   EKG 75-OHKG   Folliculitis           Current medications which may produce withdrawal symptoms if withheld perioperatively: None   Plan:    1. Preoperative workup as follows ECG, hemoglobin, hematocrit, electrolytes, creatinine, glucose, liver function studies. 2. Change in medication regimen before surgery: none, continue medication regimen  including morning of surgery, with sip of water. 3. Prophylaxis for cardiac events with perioperative beta-blockers: not indicated. 4. Invasive hemodynamic monitoring perioperatively: at the discretion of anesthesiologist. 5. Deep vein thrombosis prophylaxis postoperatively:regimen to be chosen by surgical team. 6. Surveillance for postoperative MI with ECG immediately postoperatively and on postoperative days 1 and 2 AND troponin levels 24 hours postoperatively and on day 4 or hospital discharge (whichever comes first): at the discretion of anesthesiologist. 7. Other measures:  None

## 2022-05-24 NOTE — Assessment & Plan Note (Signed)
Stable on current medications 

## 2022-05-24 NOTE — Assessment & Plan Note (Signed)
Stable and well controlled. 

## 2022-05-24 NOTE — Assessment & Plan Note (Signed)
Intermittent symptoms. Scheduled for surgery

## 2022-05-24 NOTE — Assessment & Plan Note (Signed)
Follows up with orthopedist on a regular basis Scheduled for surgery

## 2022-06-01 ENCOUNTER — Telehealth: Payer: Self-pay | Admitting: Emergency Medicine

## 2022-06-01 NOTE — Telephone Encounter (Signed)
For our records:  We have received Pre-Op PW from [facility] for the pt and it has been placed in Dr. Barry Brunner boxes.   Upon completion please fax to: [phone]

## 2022-06-01 NOTE — Telephone Encounter (Signed)
Pre op forms has been signed and faxed back with confirmation

## 2022-06-21 NOTE — Telephone Encounter (Signed)
Patient called and stated that the office told them they haven't received the surgical clearance forms. He is asking if they can be faxed again and put to attention to Dr. Reatha Armour.

## 2022-06-21 NOTE — Telephone Encounter (Signed)
Surgical clearance form has been faxed back to the surgery office.

## 2022-06-22 ENCOUNTER — Other Ambulatory Visit: Payer: Self-pay | Admitting: Neurological Surgery

## 2022-06-26 ENCOUNTER — Other Ambulatory Visit: Payer: Self-pay | Admitting: Emergency Medicine

## 2022-06-26 ENCOUNTER — Other Ambulatory Visit: Payer: Self-pay | Admitting: Physical Medicine and Rehabilitation

## 2022-06-26 DIAGNOSIS — F419 Anxiety disorder, unspecified: Secondary | ICD-10-CM

## 2022-06-30 ENCOUNTER — Other Ambulatory Visit: Payer: Self-pay

## 2022-07-03 ENCOUNTER — Other Ambulatory Visit: Payer: Self-pay | Admitting: Neurological Surgery

## 2022-07-04 ENCOUNTER — Encounter: Payer: Self-pay | Admitting: Vascular Surgery

## 2022-07-04 ENCOUNTER — Ambulatory Visit: Payer: Medicare PPO | Admitting: Vascular Surgery

## 2022-07-04 VITALS — BP 116/72 | HR 88 | Temp 97.4°F | Resp 16 | Ht 65.0 in | Wt 150.0 lb

## 2022-07-04 DIAGNOSIS — M5416 Radiculopathy, lumbar region: Secondary | ICD-10-CM | POA: Diagnosis not present

## 2022-07-04 NOTE — Progress Notes (Signed)
Patient name: John Mcclain MRN: [id number] DOB: 1955/06/24 Sex: male  REASON FOR CONSULT: Discuss anterior spine exposure for L5-S1 ALIF  HPI: John Mcclain is a 67 y.o. male, with history of hypertension, diabetes, chronic back pain that presents for evaluation of anterior spine exposure for L5-S1 ALIF.  He describes about 3.5 years of chronic lower back pain.  He has been evaluated by Dr. Reatha Armour and has failed conservative management.  Dr. Reatha Armour has recommended two-stage operation with stage I being an L5-S1 ALIF with an XLIF at L2-L3, L3-L4 and L4-L5 for correction of his scoliosis deformity followed by stage II with a T10 to pelvis instrumentation posteriorly.  He denies any previous abdominal surgery.  Past Medical History:  Diagnosis Date   Anxiety    Arthritis    Back pain    Chronic migraine    Depression    Diabetes mellitus without complication (HCC)    Heart murmur    Hypertension    Kidney stones    Raynaud's disease     Past Surgical History:  Procedure Laterality Date   COLON SURGERY     LYMPH NODE DISSECTION     removed lymph node   VASECTOMY      Family History  Problem Relation Age of Onset   Kidney disease Mother    Hypertension Mother    Arthritis Mother        RA   COPD Mother    Hyperlipidemia Mother    Heart attack Father    Diabetes Father    COPD Father    Hypertension Father    Migraines Brother    Stroke Maternal Grandfather    Alzheimer's disease Paternal Grandmother    Colon cancer Neg Hx    Pancreatic cancer Neg Hx    Esophageal cancer Neg Hx     SOCIAL HISTORY: Social History   Socioeconomic History   Marital status: Married    Spouse name: Not on file   Number of children: Not on file   Years of education: Not on file   Highest education level: Not on file  Occupational History   Not on file  Tobacco Use   Smoking status: Never   Smokeless tobacco: Never  Vaping Use   Vaping Use: Never used  Substance and Sexual  Activity   Alcohol use: Yes    Comment: 2-3 per week   Drug use: No   Sexual activity: Yes    Partners: Female  Other Topics Concern   Not on file  Social History Narrative   Married. Education: [employer].    Social Determinants of Health   Financial Resource Strain: Not on file  Food Insecurity: Not on file  Transportation Needs: Not on file  Physical Activity: Not on file  Stress: Not on file  Social Connections: Not on file  Intimate Partner Violence: Not on file    Allergies  Allergen Reactions   Penicillins     Current Outpatient Medications  Medication Sig Dispense Refill   alendronate (FOSAMAX) 70 MG tablet Take 70 mg by mouth once a week. Take with a full glass of water on an empty stomach.     amLODipine (NORVASC) 2.5 MG tablet TAKE 1 TABLET EVERY DAY (NEED TO SCHEDULE A 6 MONTH FOLLOW UP APPOINTMENT) 90 tablet 1   Calcium Carbonate-Vitamin D (CALCIUM 600+D PO) Take by mouth daily.     clobetasol (OLUX) 0.05 % topical foam Apply topically 2 (two) times daily. 50 g 1  cyclobenzaprine (FLEXERIL) 10 MG tablet Take 10 mg by mouth 3 (three) times daily as needed.     Fremanezumab-vfrm (AJOVY) 225 MG/1.5ML SOAJ Inject 1.5 mLs into the skin every 30 (thirty) days.     levETIRAcetam (KEPPRA) 750 MG tablet Take 750 mg by mouth 2 (two) times daily.      lisinopril (ZESTRIL) 10 MG tablet TAKE 1 TABLET EVERY DAY 90 tablet 3   LORazepam (ATIVAN) 1 MG tablet TAKE 1/2 TO 1 TABLET EVERY DAY AS NEEDED FOR ANXIETY AS SPARINGLY AS POSSIBLE 30 tablet 1   Magnesium 400 MG TABS Take 1 tablet by mouth daily as needed.     minocycline (MINOCIN) 50 MG capsule TAKE 1 CAPSULE TWICE DAILY 180 capsule 2   naproxen sodium (ALEVE) 220 MG tablet      pantoprazole (PROTONIX) 40 MG tablet Take 1 tablet (40 mg total) by mouth daily. 90 tablet 3   polyethylene glycol powder (GLYCOLAX/MIRALAX) powder Take 17 g by mouth daily. 3350 g prn   predniSONE (DELTASONE) 10 MG tablet Take 10 mg by mouth daily as  needed.     pregabalin (LYRICA) 75 MG capsule TAKE 1 CAPSULE TWICE DAILY 60 capsule 1   rosuvastatin (CRESTOR) 5 MG tablet TAKE 1 TABLET EVERY DAY (NEED MD APPOINTMENT FOR REFILLS) 60 tablet 3   Upadacitinib ER (RINVOQ) 15 MG WJ19 15 mg.     folic acid (FOLVITE) 1 MG tablet Take 1 mg by mouth daily.     tadalafil (CIALIS) 5 MG tablet Take 1 tablet (5 mg total) by mouth daily as needed for erectile dysfunction. 90 tablet 2   No current facility-administered medications for this visit.    REVIEW OF SYSTEMS:  '[X]'$  denotes positive finding, '[ ]'$  denotes negative finding Cardiac  Comments:  Chest pain or chest pressure:    Shortness of breath upon exertion:    Short of breath when lying flat:    Irregular heart rhythm:        Vascular    Pain in calf, thigh, or hip brought on by ambulation:    Pain in feet at night that wakes you up from your sleep:     Blood clot in your veins:    Leg swelling:         Pulmonary    Oxygen at home:    Productive cough:     Wheezing:         Neurologic    Sudden weakness in arms or legs:     Sudden numbness in arms or legs:     Sudden onset of difficulty speaking or slurred speech:    Temporary loss of vision in one eye:     Problems with dizziness:         Gastrointestinal    Blood in stool:     Vomited blood:         Genitourinary    Burning when urinating:     Blood in urine:        Psychiatric    Major depression:         Hematologic    Bleeding problems:    Problems with blood clotting too easily:        Skin    Rashes or ulcers:        Constitutional    Fever or chills:      PHYSICAL EXAM: Vitals:   07/04/22 1508  BP: 116/72  Pulse: 88  Resp: 16  Temp: (!) 97.4 F (  36.3 C)  TempSrc: Temporal  SpO2: 98%  Weight: 150 lb (68 kg)  Height: '5\' 5"'$  (1.651 m)    GENERAL: The patient is a well-nourished male, in no acute distress. The vital signs are documented above. CARDIAC: There is a regular rate and rhythm.   VASCULAR:  Palpable femoral pulses bilaterally Palpable DP pulses bilaterally PULMONARY: No respiratory distress. ABDOMEN: Soft and non-tender.  No previous incisions. MUSCULOSKELETAL: There are no major deformities or cyanosis. NEUROLOGIC: No focal weakness or paresthesias are detected. SKIN: There are no ulcers or rashes noted. PSYCHIATRIC: The patient has a normal affect.  DATA:   MRI reviewed    Assessment/Plan:  67 year old male presents for evaluation of anterior spine exposure for L5-S1 ALIF.  I have reviewed his MRI and discussed he would be a good candidate for anterior approach.  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  Discussed mobilizing the left rectus muscle to get into the retroperitoneum and mobilize peritoneum and left ureter across midline.  Discussed moving the iliac artery and vein to get to the disc space from the front.  Discussed risk of injury to above structures.  All questions answered.  Look forward to helping Dr. Reatha Armour.  Patient is scheduled for 08/04/2022.   Marty Heck, MD [facility] Office: [phone]

## 2022-07-07 ENCOUNTER — Other Ambulatory Visit: Payer: Self-pay | Admitting: Neurological Surgery

## 2022-07-26 ENCOUNTER — Other Ambulatory Visit: Payer: Self-pay

## 2022-07-31 NOTE — Pre-Procedure Instructions (Signed)
Surgical Instructions    Your procedure is scheduled on August 04, 2022.  Report to [facility] Main Entrance "A" at 5:30 A.M., then check in with the Admitting office.  Call this number if you have problems the morning of surgery:  [phone]  If you have any questions prior to your surgery date call [phone]: Open Monday-Friday 8am-4pm If you experience any cold or flu symptoms such as cough, fever, chills, shortness of breath, etc. between now and your scheduled surgery, please notify us at the above number.     Remember:  Do not eat or drink after midnight the night before your surgery      Take these medicines the morning of surgery with A SIP OF WATER:  amLODipine (NORVASC)   levETIRAcetam (KEPPRA)   pantoprazole (PROTONIX)   pregabalin (LYRICA)   rosuvastatin (CRESTOR)    May take these medicines IF NEEDED:  acetaminophen (TYLENOL)   cyclobenzaprine (FLEXERIL)   LORazepam (ATIVAN)     STOP taking Upadacitinib ER (RINVOQ) three days prior to surgery. Your last dose of this medication will be February 26th   As of today, STOP taking any Aspirin (unless otherwise instructed by your surgeon) Aleve, Naproxen, Ibuprofen, Motrin, Advil, Goody's, BC's, all herbal medications, fish oil, and all vitamins.   HOW TO MANAGE YOUR DIABETES BEFORE AND AFTER SURGERY  Why is it important to control my blood sugar before and after surgery? Improving blood sugar levels before and after surgery helps healing and can limit problems. A way of improving blood sugar control is eating a healthy diet by:  Eating less sugar and carbohydrates  Increasing activity/exercise  Talking with your doctor about reaching your blood sugar goals High blood sugars (greater than 180 mg/dL) can raise your risk of infections and slow your recovery, so you will need to focus on controlling your diabetes during the weeks before surgery. Make sure that the doctor who takes care of your diabetes knows about  your planned surgery including the date and location.  How do I manage my blood sugar before surgery? Check your blood sugar at least 4 times a day, starting 2 days before surgery, to make sure that the level is not too high or low.  Check your blood sugar the morning of your surgery when you wake up and every 2 hours until you get to the Short Stay unit.  If your blood sugar is less than 70 mg/dL, you will need to treat for low blood sugar: Do not take insulin. Treat a low blood sugar (less than 70 mg/dL) with  cup of clear juice (cranberry or apple), 4 glucose tablets, OR glucose gel. Recheck blood sugar in 15 minutes after treatment (to make sure it is greater than 70 mg/dL). If your blood sugar is not greater than 70 mg/dL on recheck, call [phone] for further instructions. Report your blood sugar to the short stay nurse when you get to Short Stay.  If you are admitted to the hospital after surgery: Your blood sugar will be checked by the staff and you will probably be given insulin after surgery (instead of oral diabetes medicines) to make sure you have good blood sugar levels. The goal for blood sugar control after surgery is 80-180 mg/dL.                      Do NOT Smoke (Tobacco/Vaping) for 24 hours prior to your procedure.  If you use a CPAP at night, you may bring  your mask/headgear for your overnight stay.   Contacts, glasses, piercing's, hearing aid's, dentures or partials may not be worn into surgery, please bring cases for these belongings.    For patients admitted to the hospital, discharge time will be determined by your treatment team.   Patients discharged the day of surgery will not be allowed to drive home, and someone needs to stay with them for 24 hours.  SURGICAL WAITING ROOM VISITATION Patients having surgery or a procedure may have no more than 2 support people in the waiting area - these visitors may rotate.   Children under the age of 74 must have an  adult with them who is not the patient. If the patient needs to stay at the hospital during part of their recovery, the visitor guidelines for inpatient rooms apply. Pre-op nurse will coordinate an appropriate time for 1 support person to accompany patient in pre-op.  This support person may not rotate.   Please refer to the [facility] website for the visitor guidelines for Inpatients (after your surgery is over and you are in a regular room).    Special instructions:   [facility]- Preparing For Surgery  Before surgery, you can play an important role. Because skin is not sterile, your skin needs to be as free of germs as possible. You can reduce the number of germs on your skin by washing with CHG (chlorahexidine gluconate) Soap before surgery.  CHG is an antiseptic cleaner which kills germs and bonds with the skin to continue killing germs even after washing.    Oral Hygiene is also important to reduce your risk of infection.  Remember - BRUSH YOUR TEETH THE MORNING OF SURGERY WITH YOUR REGULAR TOOTHPASTE  Please do not use if you have an allergy to CHG or antibacterial soaps. If your skin becomes reddened/irritated stop using the CHG.  Do not shave (including legs and underarms) for at least 48 hours prior to first CHG shower. It is OK to shave your face.  Please follow these instructions carefully.   Shower the NIGHT BEFORE SURGERY and the MORNING OF SURGERY  If you chose to wash your hair, wash your hair first as usual with your normal shampoo.  After you shampoo, rinse your hair and body thoroughly to remove the shampoo.  Use CHG Soap as you would any other liquid soap. You can apply CHG directly to the skin and wash gently with a scrungie or a clean washcloth.   Apply the CHG Soap to your body ONLY FROM THE NECK DOWN.  Do not use on open wounds or open sores. Avoid contact with your eyes, ears, mouth and genitals (private parts). Wash Face and genitals (private parts)  with your  normal soap.   Wash thoroughly, paying special attention to the area where your surgery will be performed.  Thoroughly rinse your body with warm water from the neck down.  DO NOT shower/wash with your normal soap after using and rinsing off the CHG Soap.  Pat yourself dry with a CLEAN TOWEL.  Wear CLEAN PAJAMAS to bed the night before surgery  Place CLEAN SHEETS on your bed the night before your surgery  DO NOT SLEEP WITH PETS.   Day of Surgery: Take a shower with CHG soap. Do not wear jewelry or makeup Do not wear lotions, powders, perfumes/colognes, or deodorant. Do not shave 48 hours prior to surgery.  Men may shave face and neck. Do not bring valuables to the hospital.  [facility] is  not responsible for any belongings or valuables. Do not wear nail polish, gel polish, artificial nails, or any other type of covering on natural nails (fingers and toes) If you have artificial nails or gel coating that need to be removed by a nail salon, please have this removed prior to surgery. Artificial nails or gel coating may interfere with anesthesia's ability to adequately monitor your vital signs.  Wear Clean/Comfortable clothing the morning of surgery Remember to brush your teeth WITH YOUR REGULAR TOOTHPASTE.   Please read over the following fact sheets that you were given.    If you received a COVID test during your pre-op visit  it is requested that you wear a mask when out in public, stay away from anyone that may not be feeling well and notify your surgeon if you develop symptoms. If you have been in contact with anyone that has tested positive in the last 10 days please notify you surgeon.

## 2022-08-01 ENCOUNTER — Encounter (HOSPITAL_COMMUNITY)
Admission: RE | Admit: 2022-08-01 | Discharge: 2022-08-01 | Disposition: A | Discharge status: Home / Self Care | Payer: Medicare PPO | Source: Ambulatory Visit | Attending: Neurological Surgery | Admitting: Neurological Surgery

## 2022-08-01 ENCOUNTER — Other Ambulatory Visit: Payer: Self-pay

## 2022-08-01 ENCOUNTER — Encounter (HOSPITAL_COMMUNITY): Payer: Self-pay

## 2022-08-01 VITALS — BP 176/83 | HR 89 | Temp 97.7°F | Resp 17 | Ht 65.0 in | Wt 154.0 lb

## 2022-08-01 DIAGNOSIS — M4185 Other forms of scoliosis, thoracolumbar region: Secondary | ICD-10-CM | POA: Diagnosis present

## 2022-08-01 DIAGNOSIS — M7989 Other specified soft tissue disorders: Secondary | ICD-10-CM | POA: Diagnosis not present

## 2022-08-01 DIAGNOSIS — D62 Acute posthemorrhagic anemia: Secondary | ICD-10-CM | POA: Diagnosis not present

## 2022-08-01 DIAGNOSIS — M069 Rheumatoid arthritis, unspecified: Secondary | ICD-10-CM | POA: Diagnosis present

## 2022-08-01 DIAGNOSIS — M4186 Other forms of scoliosis, lumbar region: Secondary | ICD-10-CM | POA: Diagnosis not present

## 2022-08-01 DIAGNOSIS — N451 Epididymitis: Secondary | ICD-10-CM | POA: Diagnosis not present

## 2022-08-01 DIAGNOSIS — M4156 Other secondary scoliosis, lumbar region: Secondary | ICD-10-CM | POA: Diagnosis present

## 2022-08-01 DIAGNOSIS — M5416 Radiculopathy, lumbar region: Secondary | ICD-10-CM | POA: Diagnosis not present

## 2022-08-01 DIAGNOSIS — M5117 Intervertebral disc disorders with radiculopathy, lumbosacral region: Secondary | ICD-10-CM | POA: Diagnosis not present

## 2022-08-01 DIAGNOSIS — M4326 Fusion of spine, lumbar region: Secondary | ICD-10-CM | POA: Diagnosis not present

## 2022-08-01 DIAGNOSIS — N433 Hydrocele, unspecified: Secondary | ICD-10-CM | POA: Diagnosis not present

## 2022-08-01 DIAGNOSIS — N179 Acute kidney failure, unspecified: Secondary | ICD-10-CM | POA: Diagnosis not present

## 2022-08-01 DIAGNOSIS — Z820 Family history of epilepsy and other diseases of the nervous system: Secondary | ICD-10-CM | POA: Diagnosis not present

## 2022-08-01 DIAGNOSIS — Z01812 Encounter for preprocedural laboratory examination: Secondary | ICD-10-CM | POA: Insufficient documentation

## 2022-08-01 DIAGNOSIS — M4316 Spondylolisthesis, lumbar region: Secondary | ICD-10-CM | POA: Diagnosis present

## 2022-08-01 DIAGNOSIS — F419 Anxiety disorder, unspecified: Secondary | ICD-10-CM | POA: Diagnosis present

## 2022-08-01 DIAGNOSIS — F418 Other specified anxiety disorders: Secondary | ICD-10-CM | POA: Diagnosis not present

## 2022-08-01 DIAGNOSIS — M5417 Radiculopathy, lumbosacral region: Secondary | ICD-10-CM | POA: Diagnosis not present

## 2022-08-01 DIAGNOSIS — N2889 Other specified disorders of kidney and ureter: Secondary | ICD-10-CM | POA: Diagnosis not present

## 2022-08-01 DIAGNOSIS — M5116 Intervertebral disc disorders with radiculopathy, lumbar region: Secondary | ICD-10-CM | POA: Diagnosis not present

## 2022-08-01 DIAGNOSIS — M48061 Spinal stenosis, lumbar region without neurogenic claudication: Secondary | ICD-10-CM | POA: Diagnosis present

## 2022-08-01 DIAGNOSIS — I10 Essential (primary) hypertension: Secondary | ICD-10-CM | POA: Diagnosis present

## 2022-08-01 DIAGNOSIS — Z01818 Encounter for other preprocedural examination: Secondary | ICD-10-CM

## 2022-08-01 DIAGNOSIS — I82402 Acute embolism and thrombosis of unspecified deep veins of left lower extremity: Secondary | ICD-10-CM | POA: Diagnosis not present

## 2022-08-01 DIAGNOSIS — M79605 Pain in left leg: Secondary | ICD-10-CM | POA: Diagnosis not present

## 2022-08-01 DIAGNOSIS — I7300 Raynaud's syndrome without gangrene: Secondary | ICD-10-CM | POA: Diagnosis present

## 2022-08-01 DIAGNOSIS — G8929 Other chronic pain: Secondary | ICD-10-CM | POA: Diagnosis present

## 2022-08-01 DIAGNOSIS — Z981 Arthrodesis status: Secondary | ICD-10-CM | POA: Diagnosis not present

## 2022-08-01 DIAGNOSIS — K828 Other specified diseases of gallbladder: Secondary | ICD-10-CM | POA: Diagnosis not present

## 2022-08-01 DIAGNOSIS — M4155 Other secondary scoliosis, thoracolumbar region: Secondary | ICD-10-CM | POA: Diagnosis not present

## 2022-08-01 DIAGNOSIS — Z823 Family history of stroke: Secondary | ICD-10-CM | POA: Diagnosis not present

## 2022-08-01 DIAGNOSIS — N5089 Other specified disorders of the male genital organs: Secondary | ICD-10-CM | POA: Diagnosis not present

## 2022-08-01 DIAGNOSIS — E861 Hypovolemia: Secondary | ICD-10-CM | POA: Diagnosis not present

## 2022-08-01 DIAGNOSIS — Z8261 Family history of arthritis: Secondary | ICD-10-CM | POA: Diagnosis not present

## 2022-08-01 DIAGNOSIS — M4807 Spinal stenosis, lumbosacral region: Secondary | ICD-10-CM | POA: Diagnosis present

## 2022-08-01 DIAGNOSIS — Z825 Family history of asthma and other chronic lower respiratory diseases: Secondary | ICD-10-CM | POA: Diagnosis not present

## 2022-08-01 DIAGNOSIS — D72829 Elevated white blood cell count, unspecified: Secondary | ICD-10-CM | POA: Diagnosis not present

## 2022-08-01 DIAGNOSIS — M4126 Other idiopathic scoliosis, lumbar region: Secondary | ICD-10-CM | POA: Diagnosis not present

## 2022-08-01 DIAGNOSIS — Z8249 Family history of ischemic heart disease and other diseases of the circulatory system: Secondary | ICD-10-CM | POA: Diagnosis not present

## 2022-08-01 DIAGNOSIS — F32A Depression, unspecified: Secondary | ICD-10-CM | POA: Diagnosis present

## 2022-08-01 DIAGNOSIS — E119 Type 2 diabetes mellitus without complications: Secondary | ICD-10-CM | POA: Diagnosis present

## 2022-08-01 DIAGNOSIS — Z9889 Other specified postprocedural states: Secondary | ICD-10-CM | POA: Diagnosis not present

## 2022-08-01 DIAGNOSIS — D649 Anemia, unspecified: Secondary | ICD-10-CM | POA: Diagnosis present

## 2022-08-01 DIAGNOSIS — R2242 Localized swelling, mass and lump, left lower limb: Secondary | ICD-10-CM | POA: Diagnosis not present

## 2022-08-01 DIAGNOSIS — E860 Dehydration: Secondary | ICD-10-CM | POA: Diagnosis not present

## 2022-08-01 HISTORY — DX: Pneumonia, unspecified organism: J18.9

## 2022-08-01 HISTORY — DX: Personal history of urinary calculi: Z87.442

## 2022-08-01 HISTORY — DX: Gastro-esophageal reflux disease without esophagitis: K21.9

## 2022-08-01 HISTORY — DX: Rheumatoid arthritis, unspecified: M06.9

## 2022-08-01 LAB — CBC
HCT: 42.1 % (ref 39.0–52.0)
Hemoglobin: 13.8 g/dL (ref 13.0–17.0)
MCH: 30.9 pg (ref 26.0–34.0)
MCHC: 32.8 g/dL (ref 30.0–36.0)
MCV: 94.2 fL (ref 80.0–100.0)
Platelets: 271 10*3/uL (ref 150–400)
RBC: 4.47 MIL/uL (ref 4.22–5.81)
RDW: 15.8 % — ABNORMAL HIGH (ref 11.5–15.5)
WBC: 5.7 10*3/uL (ref 4.0–10.5)
nRBC: 0 % (ref 0.0–0.2)

## 2022-08-01 LAB — SURGICAL PCR SCREEN
MRSA, PCR: NEGATIVE
Staphylococcus aureus: NEGATIVE

## 2022-08-01 LAB — BASIC METABOLIC PANEL
Anion gap: 9 (ref 5–15)
BUN: 7 mg/dL — ABNORMAL LOW (ref 8–23)
CO2: 26 mmol/L (ref 22–32)
Calcium: 9.3 mg/dL (ref 8.9–10.3)
Chloride: 103 mmol/L (ref 98–111)
Creatinine, Ser: 1.07 mg/dL (ref 0.61–1.24)
GFR, Estimated: >60 mL/min (ref >60)
Glucose, Bld: 133 mg/dL — ABNORMAL HIGH (ref 70–99)
Potassium: 3.7 mmol/L (ref 3.5–5.1)
Sodium: 138 mmol/L (ref 135–145)

## 2022-08-01 NOTE — Progress Notes (Signed)
PCP - Dr. Ines Bloomer Sagardia Cardiologist - Denies  PPM/ICD - Denies Device Orders - n/a Rep Notified - n/a  Chest x-ray - n/a EKG - 05/24/2022 - Tracing Requested Stress Test - Denies ECHO - Denies Cardiac Cath - Denies  Sleep Study - Denies CPAP - n/a  No DM  Last dose of GLP1 agonist- n/a GLP1 instructions: n/a  Blood Thinner Instructions: n/a Aspirin Instructions: n/a  NPO after midnight  COVID TEST- n/a   Anesthesia review: Yes. Medical Clearance 05/24/2022. EKG from that visit requested.   Patient denies shortness of breath, fever, cough and chest pain at PAT appointment   All instructions explained to the patient, with a verbal understanding of the material. Patient agrees to go over the instructions while at home for a better understanding. Patient also instructed to self quarantine after being tested for COVID-19. The opportunity to ask questions was provided.

## 2022-08-02 NOTE — Progress Notes (Signed)
Anesthesia Chart Review:  67 year old male with pertinent history including which arthritis (on Rinvoq), chronic migraine, HTN, Raynaud's disease, hyperlipidemia, GERD.  She was seen by his PCP Dr. Agustina Caroli on 05/24/2022 for preop evaluation.  Per note, no acute concerns, hypertension well-controlled, recommended proceed with surgery as planned.  EKG was done at that visit, however, tracing not available in epic and has been requested.  Spoke with patient at preop appointment due to report of heart murmur.  He states that many years ago he was told he had a soft heart murmur.  He states this has not been noted on any subsequent evaluations.  His PCP documented no murmur on preop exam 05/24/2022.  On exam today he is well-appearing, in no acute distress, ambulates with a cane due to extensive lumbar spine pathology.  On auscultation heart is regular rate and rhythm, there is a very soft systolic murmur.  Lungs clear to auscultation bilaterally.  Preop labs reviewed, unremarkable.    Wynonia Musty [facility] Short Stay Center/Anesthesiology Phone [phone] 08/03/2022 4:27 PM

## 2022-08-03 ENCOUNTER — Ambulatory Visit (INDEPENDENT_AMBULATORY_CARE_PROVIDER_SITE_OTHER): Payer: Medicare PPO

## 2022-08-03 DIAGNOSIS — Z01818 Encounter for other preprocedural examination: Secondary | ICD-10-CM

## 2022-08-03 NOTE — Anesthesia Preprocedure Evaluation (Addendum)
Anesthesia Evaluation  Patient identified by MRN, date of birth, ID band Patient awake    Reviewed: Allergy & Precautions, NPO status , Patient's Chart, lab work & pertinent test results  Airway Mallampati: II  TM Distance: >3 FB Neck ROM: Full    Dental no notable dental hx.    Pulmonary neg pulmonary ROS   Pulmonary exam normal        Cardiovascular hypertension, Pt. on medications  Rhythm:Regular Rate:Normal     Neuro/Psych  Headaches  Anxiety Depression       GI/Hepatic Neg liver ROS,GERD  Medicated,,  Endo/Other  negative endocrine ROS    Renal/GU negative Renal ROS  negative genitourinary   Musculoskeletal  (+) Arthritis , Rheumatoid disorders,    Abdominal Normal abdominal exam  (+)   Peds  Hematology negative hematology ROS (+) Lab Results      Component                Value               Date                      WBC                      5.7                 08/01/2022                HGB                      13.8                08/01/2022                HCT                      42.1                08/01/2022                MCV                      94.2                08/01/2022                PLT                      271                 08/01/2022             Lab Results      Component                Value               Date                      NA                       138                 08/01/2022                K  3.7                 08/01/2022                CO2                      26                  08/01/2022                GLUCOSE                  133 (H)             08/01/2022                BUN                      7 (L)               08/01/2022                CREATININE               1.07                08/01/2022                CALCIUM                  9.3                 08/01/2022                GFRNONAA                 >60                 08/01/2022               Anesthesia Other Findings   Reproductive/Obstetrics                             Anesthesia Physical Anesthesia Plan  ASA: 3  Anesthesia Plan: General   Post-op Pain Management: Celebrex PO (pre-op)*, Tylenol PO (pre-op)* and Ketamine IV*   Induction: Intravenous  PONV Risk Score and Plan: 2 and Ondansetron, Dexamethasone, Midazolam and Treatment may vary due to age or medical condition  Airway Management Planned: Mask and Oral ETT  Additional Equipment: Arterial line  Intra-op Plan:   Post-operative Plan: Extubation in OR  Informed Consent: I have reviewed the patients History and Physical, chart, labs and discussed the procedure including the risks, benefits and alternatives for the proposed anesthesia with the patient or authorized representative who has indicated his/her understanding and acceptance.     Dental advisory given  Plan Discussed with: CRNA  Anesthesia Plan Comments: (PAT note by Karoline Caldwell, PA-C: 67 year old male with pertinent history including which arthritis (on Rinvoq), chronic migraine, HTN, Raynaud's disease, hyperlipidemia, GERD.  She was seen by his PCP Dr. Agustina Caroli on 05/24/2022 for preop evaluation.  Per note, no acute concerns, hypertension well-controlled, recommended proceed with surgery as planned.  EKG was done at that visit, however, tracing not available in epic and has been requested.  Spoke with patient at preop appointment due to report of heart murmur.  He states that many years ago he was told he had a soft heart murmur.  He states this has not been noted on any subsequent evaluations.  His PCP documented no murmur on preop exam 05/24/2022.  On exam today he is well-appearing, in no acute distress, ambulates with a cane due to extensive lumbar spine pathology.  On auscultation heart is regular rate and rhythm, there is a very soft systolic murmur.  Lungs clear to auscultation bilaterally.  Preop labs  reviewed, unremarkable.   )        Anesthesia Quick Evaluation

## 2022-08-03 NOTE — Progress Notes (Signed)
Pt needed EKG done to send over to surgeon's office.

## 2022-08-04 ENCOUNTER — Inpatient Hospital Stay (HOSPITAL_COMMUNITY): Payer: Medicare PPO

## 2022-08-04 ENCOUNTER — Inpatient Hospital Stay (HOSPITAL_COMMUNITY)
Admission: RE | Admit: 2022-08-04 | Discharge: 2022-08-21 | DRG: 454 | Disposition: A | Discharge status: Home Health | Payer: Medicare PPO | Attending: Neurological Surgery | Admitting: Neurological Surgery

## 2022-08-04 ENCOUNTER — Inpatient Hospital Stay (HOSPITAL_COMMUNITY): Payer: Medicare PPO | Admitting: Certified Registered"

## 2022-08-04 ENCOUNTER — Other Ambulatory Visit: Payer: Self-pay

## 2022-08-04 ENCOUNTER — Encounter (HOSPITAL_COMMUNITY)
Admission: RE | Disposition: A | Discharge status: Home / Self Care | Payer: Self-pay | Source: Home / Self Care | Attending: Neurological Surgery

## 2022-08-04 ENCOUNTER — Encounter (HOSPITAL_COMMUNITY): Payer: Self-pay | Admitting: Neurological Surgery

## 2022-08-04 ENCOUNTER — Inpatient Hospital Stay (HOSPITAL_COMMUNITY): Payer: Medicare PPO | Admitting: Physician Assistant

## 2022-08-04 DIAGNOSIS — Z7952 Long term (current) use of systemic steroids: Secondary | ICD-10-CM

## 2022-08-04 DIAGNOSIS — Z8249 Family history of ischemic heart disease and other diseases of the circulatory system: Secondary | ICD-10-CM | POA: Diagnosis not present

## 2022-08-04 DIAGNOSIS — E860 Dehydration: Secondary | ICD-10-CM | POA: Diagnosis not present

## 2022-08-04 DIAGNOSIS — M5117 Intervertebral disc disorders with radiculopathy, lumbosacral region: Secondary | ICD-10-CM | POA: Diagnosis not present

## 2022-08-04 DIAGNOSIS — Z820 Family history of epilepsy and other diseases of the nervous system: Secondary | ICD-10-CM

## 2022-08-04 DIAGNOSIS — D649 Anemia, unspecified: Secondary | ICD-10-CM | POA: Diagnosis present

## 2022-08-04 DIAGNOSIS — E119 Type 2 diabetes mellitus without complications: Secondary | ICD-10-CM | POA: Diagnosis present

## 2022-08-04 DIAGNOSIS — Z8261 Family history of arthritis: Secondary | ICD-10-CM

## 2022-08-04 DIAGNOSIS — D62 Acute posthemorrhagic anemia: Secondary | ICD-10-CM | POA: Diagnosis not present

## 2022-08-04 DIAGNOSIS — E861 Hypovolemia: Secondary | ICD-10-CM | POA: Diagnosis not present

## 2022-08-04 DIAGNOSIS — F32A Depression, unspecified: Secondary | ICD-10-CM | POA: Diagnosis present

## 2022-08-04 DIAGNOSIS — M4316 Spondylolisthesis, lumbar region: Secondary | ICD-10-CM | POA: Diagnosis not present

## 2022-08-04 DIAGNOSIS — M069 Rheumatoid arthritis, unspecified: Secondary | ICD-10-CM | POA: Diagnosis present

## 2022-08-04 DIAGNOSIS — N451 Epididymitis: Secondary | ICD-10-CM | POA: Diagnosis not present

## 2022-08-04 DIAGNOSIS — M1990 Unspecified osteoarthritis, unspecified site: Secondary | ICD-10-CM | POA: Diagnosis present

## 2022-08-04 DIAGNOSIS — G43909 Migraine, unspecified, not intractable, without status migrainosus: Secondary | ICD-10-CM | POA: Diagnosis present

## 2022-08-04 DIAGNOSIS — Z841 Family history of disorders of kidney and ureter: Secondary | ICD-10-CM

## 2022-08-04 DIAGNOSIS — Z83438 Family history of other disorder of lipoprotein metabolism and other lipidemia: Secondary | ICD-10-CM

## 2022-08-04 DIAGNOSIS — I82402 Acute embolism and thrombosis of unspecified deep veins of left lower extremity: Secondary | ICD-10-CM | POA: Diagnosis not present

## 2022-08-04 DIAGNOSIS — M4156 Other secondary scoliosis, lumbar region: Secondary | ICD-10-CM | POA: Diagnosis present

## 2022-08-04 DIAGNOSIS — M4155 Other secondary scoliosis, thoracolumbar region: Secondary | ICD-10-CM | POA: Diagnosis not present

## 2022-08-04 DIAGNOSIS — I10 Essential (primary) hypertension: Secondary | ICD-10-CM | POA: Diagnosis present

## 2022-08-04 DIAGNOSIS — M7989 Other specified soft tissue disorders: Secondary | ICD-10-CM | POA: Diagnosis not present

## 2022-08-04 DIAGNOSIS — M48061 Spinal stenosis, lumbar region without neurogenic claudication: Secondary | ICD-10-CM | POA: Diagnosis present

## 2022-08-04 DIAGNOSIS — M5116 Intervertebral disc disorders with radiculopathy, lumbar region: Secondary | ICD-10-CM | POA: Diagnosis present

## 2022-08-04 DIAGNOSIS — R2242 Localized swelling, mass and lump, left lower limb: Secondary | ICD-10-CM | POA: Diagnosis not present

## 2022-08-04 DIAGNOSIS — G8929 Other chronic pain: Secondary | ICD-10-CM | POA: Diagnosis present

## 2022-08-04 DIAGNOSIS — M5416 Radiculopathy, lumbar region: Secondary | ICD-10-CM

## 2022-08-04 DIAGNOSIS — K219 Gastro-esophageal reflux disease without esophagitis: Secondary | ICD-10-CM | POA: Diagnosis present

## 2022-08-04 DIAGNOSIS — F419 Anxiety disorder, unspecified: Secondary | ICD-10-CM | POA: Diagnosis present

## 2022-08-04 DIAGNOSIS — Z880 Allergy status to penicillin: Secondary | ICD-10-CM

## 2022-08-04 DIAGNOSIS — R102 Pelvic and perineal pain: Secondary | ICD-10-CM | POA: Diagnosis not present

## 2022-08-04 DIAGNOSIS — M4150 Other secondary scoliosis, site unspecified: Principal | ICD-10-CM | POA: Diagnosis present

## 2022-08-04 DIAGNOSIS — Z825 Family history of asthma and other chronic lower respiratory diseases: Secondary | ICD-10-CM

## 2022-08-04 DIAGNOSIS — M4185 Other forms of scoliosis, thoracolumbar region: Secondary | ICD-10-CM | POA: Diagnosis present

## 2022-08-04 DIAGNOSIS — N179 Acute kidney failure, unspecified: Secondary | ICD-10-CM | POA: Diagnosis not present

## 2022-08-04 DIAGNOSIS — M79605 Pain in left leg: Secondary | ICD-10-CM | POA: Diagnosis not present

## 2022-08-04 DIAGNOSIS — I7300 Raynaud's syndrome without gangrene: Secondary | ICD-10-CM | POA: Diagnosis present

## 2022-08-04 DIAGNOSIS — M4807 Spinal stenosis, lumbosacral region: Secondary | ICD-10-CM | POA: Diagnosis present

## 2022-08-04 DIAGNOSIS — N5089 Other specified disorders of the male genital organs: Secondary | ICD-10-CM | POA: Diagnosis not present

## 2022-08-04 DIAGNOSIS — Z79899 Other long term (current) drug therapy: Secondary | ICD-10-CM

## 2022-08-04 DIAGNOSIS — I959 Hypotension, unspecified: Secondary | ICD-10-CM | POA: Diagnosis not present

## 2022-08-04 DIAGNOSIS — Z823 Family history of stroke: Secondary | ICD-10-CM | POA: Diagnosis not present

## 2022-08-04 DIAGNOSIS — M4126 Other idiopathic scoliosis, lumbar region: Secondary | ICD-10-CM | POA: Diagnosis not present

## 2022-08-04 DIAGNOSIS — Z7983 Long term (current) use of bisphosphonates: Secondary | ICD-10-CM

## 2022-08-04 DIAGNOSIS — Z87442 Personal history of urinary calculi: Secondary | ICD-10-CM

## 2022-08-04 DIAGNOSIS — D72829 Elevated white blood cell count, unspecified: Secondary | ICD-10-CM | POA: Diagnosis not present

## 2022-08-04 DIAGNOSIS — Z833 Family history of diabetes mellitus: Secondary | ICD-10-CM

## 2022-08-04 DIAGNOSIS — Z981 Arthrodesis status: Secondary | ICD-10-CM | POA: Diagnosis not present

## 2022-08-04 DIAGNOSIS — M4326 Fusion of spine, lumbar region: Secondary | ICD-10-CM | POA: Diagnosis not present

## 2022-08-04 HISTORY — PX: ANTERIOR LAT LUMBAR FUSION: SHX1168

## 2022-08-04 HISTORY — PX: ABDOMINAL EXPOSURE: SHX5708

## 2022-08-04 LAB — ABO/RH: ABO/RH(D): O POS

## 2022-08-04 SURGERY — ANTERIOR LATERAL LUMBAR FUSION 1 LEVEL
Anesthesia: General

## 2022-08-04 MED ORDER — CHLORHEXIDINE GLUCONATE CLOTH 2 % EX PADS: 6.0000 | MEDICATED_PAD | Freq: Once | CUTANEOUS | Status: DC

## 2022-08-04 MED ORDER — PREGABALIN 75 MG PO CAPS
75.0000 mg | ORAL_CAPSULE | Freq: Two times a day (BID) | ORAL | Status: DC
  Administered 2022-08-04 – 2022-08-21 (×33): 75 mg via ORAL
  Filled 2022-08-04 (×33): qty 1

## 2022-08-04 MED ORDER — DEXAMETHASONE SODIUM PHOSPHATE 10 MG/ML IJ SOLN
INTRAMUSCULAR | Status: AC
  Filled 2022-08-04: qty 1

## 2022-08-04 MED ORDER — ACETAMINOPHEN 500 MG PO TABS
1000.0000 mg | ORAL_TABLET | Freq: Once | ORAL | Status: AC
  Administered 2022-08-04: 1000 mg via ORAL

## 2022-08-04 MED ORDER — METHOCARBAMOL 1000 MG/10ML IJ SOLN: 500.0000 mg | Freq: Four times a day (QID) | INTRAVENOUS | Status: DC | PRN

## 2022-08-04 MED ORDER — FENTANYL CITRATE (PF) 100 MCG/2ML IJ SOLN
INTRAMUSCULAR | Status: AC
  Filled 2022-08-04: qty 2

## 2022-08-04 MED ORDER — ONDANSETRON HCL 4 MG PO TABS
4.0000 mg | ORAL_TABLET | Freq: Four times a day (QID) | ORAL | Status: DC | PRN
  Administered 2022-08-05 – 2022-08-06 (×2): 4 mg via ORAL
  Filled 2022-08-04 (×2): qty 1

## 2022-08-04 MED ORDER — VANCOMYCIN HCL IN DEXTROSE 1-5 GM/200ML-% IV SOLN
1000.0000 mg | INTRAVENOUS | Status: AC
  Administered 2022-08-04: 1000 mg via INTRAVENOUS
  Filled 2022-08-04: qty 200

## 2022-08-04 MED ORDER — PANTOPRAZOLE SODIUM 40 MG PO TBEC
40.0000 mg | DELAYED_RELEASE_TABLET | Freq: Every day | ORAL | Status: DC
  Administered 2022-08-05 – 2022-08-21 (×16): 40 mg via ORAL
  Filled 2022-08-04 (×16): qty 1

## 2022-08-04 MED ORDER — CHLORHEXIDINE GLUCONATE 0.12 % MT SOLN
15.0000 mL | Freq: Once | OROMUCOSAL | Status: AC
  Administered 2022-08-04: 15 mL via OROMUCOSAL
  Filled 2022-08-04: qty 15

## 2022-08-04 MED ORDER — THROMBIN 5000 UNITS EX SOLR
CUTANEOUS | Status: AC
  Filled 2022-08-04: qty 5000

## 2022-08-04 MED ORDER — ACETAMINOPHEN 10 MG/ML IV SOLN: 1000.0000 mg | Freq: Once | INTRAVENOUS | Status: DC | PRN

## 2022-08-04 MED ORDER — MIDAZOLAM HCL 2 MG/2ML IJ SOLN
INTRAMUSCULAR | Status: DC | PRN
  Administered 2022-08-04: 2 mg via INTRAVENOUS

## 2022-08-04 MED ORDER — CELECOXIB 200 MG PO CAPS
ORAL_CAPSULE | ORAL | Status: AC
  Filled 2022-08-04: qty 1

## 2022-08-04 MED ORDER — LEVETIRACETAM 750 MG PO TABS
750.0000 mg | ORAL_TABLET | Freq: Two times a day (BID) | ORAL | Status: DC
  Administered 2022-08-04 – 2022-08-21 (×33): 750 mg via ORAL
  Filled 2022-08-04 (×36): qty 1

## 2022-08-04 MED ORDER — ACETAMINOPHEN 650 MG RE SUPP: 650.0000 mg | RECTAL | Status: DC | PRN

## 2022-08-04 MED ORDER — LIDOCAINE-EPINEPHRINE 1 %-1:100000 IJ SOLN
INTRAMUSCULAR | Status: AC
  Filled 2022-08-04: qty 1

## 2022-08-04 MED ORDER — LORAZEPAM 1 MG PO TABS
1.0000 mg | ORAL_TABLET | Freq: Two times a day (BID) | ORAL | Status: DC | PRN
  Administered 2022-08-04 – 2022-08-20 (×18): 1 mg via ORAL
  Filled 2022-08-04 (×18): qty 1

## 2022-08-04 MED ORDER — ACETAMINOPHEN 500 MG PO TABS
ORAL_TABLET | ORAL | Status: AC
  Filled 2022-08-04: qty 2

## 2022-08-04 MED ORDER — LIDOCAINE-EPINEPHRINE 1 %-1:100000 IJ SOLN
INTRAMUSCULAR | Status: DC | PRN
  Administered 2022-08-04: 5 mL

## 2022-08-04 MED ORDER — FENTANYL CITRATE (PF) 100 MCG/2ML IJ SOLN
25.0000 ug | INTRAMUSCULAR | Status: DC | PRN
  Administered 2022-08-04 (×3): 50 ug via INTRAVENOUS

## 2022-08-04 MED ORDER — ORAL CARE MOUTH RINSE: 15.0000 mL | Freq: Once | OROMUCOSAL | Status: AC

## 2022-08-04 MED ORDER — AMLODIPINE BESYLATE 2.5 MG PO TABS
2.5000 mg | ORAL_TABLET | Freq: Every day | ORAL | Status: DC
  Administered 2022-08-05 – 2022-08-16 (×11): 2.5 mg via ORAL
  Filled 2022-08-04 (×9): qty 1

## 2022-08-04 MED ORDER — EPHEDRINE SULFATE-NACL 50-0.9 MG/10ML-% IV SOSY
PREFILLED_SYRINGE | INTRAVENOUS | Status: DC | PRN
  Administered 2022-08-04 (×2): 10 mg via INTRAVENOUS
  Administered 2022-08-04: 5 mg via INTRAVENOUS

## 2022-08-04 MED ORDER — PHENYLEPHRINE HCL-NACL 20-0.9 MG/250ML-% IV SOLN
INTRAVENOUS | Status: DC | PRN
  Administered 2022-08-04: 50 ug/min via INTRAVENOUS

## 2022-08-04 MED ORDER — ROSUVASTATIN CALCIUM 5 MG PO TABS
5.0000 mg | ORAL_TABLET | Freq: Every day | ORAL | Status: DC
  Administered 2022-08-05 – 2022-08-21 (×16): 5 mg via ORAL
  Filled 2022-08-04 (×16): qty 1

## 2022-08-04 MED ORDER — VANCOMYCIN HCL IN DEXTROSE 1-5 GM/200ML-% IV SOLN
1000.0000 mg | Freq: Once | INTRAVENOUS | Status: AC
  Administered 2022-08-05: 1000 mg via INTRAVENOUS
  Filled 2022-08-04: qty 200

## 2022-08-04 MED ORDER — SUFENTANIL CITRATE 50 MCG/ML IV SOLN
INTRAVENOUS | Status: AC
  Filled 2022-08-04: qty 1

## 2022-08-04 MED ORDER — SUGAMMADEX SODIUM 200 MG/2ML IV SOLN
INTRAVENOUS | Status: DC | PRN
  Administered 2022-08-04: 200 mg via INTRAVENOUS

## 2022-08-04 MED ORDER — SODIUM CHLORIDE (PF) 0.9 % IJ SOLN
INTRAMUSCULAR | Status: AC
  Filled 2022-08-04: qty 10

## 2022-08-04 MED ORDER — PROPOFOL 10 MG/ML IV BOLUS
INTRAVENOUS | Status: DC | PRN
  Administered 2022-08-04: 140 mg via INTRAVENOUS

## 2022-08-04 MED ORDER — PHENYLEPHRINE HCL (PRESSORS) 10 MG/ML IV SOLN
INTRAVENOUS | Status: AC
  Filled 2022-08-04: qty 1

## 2022-08-04 MED ORDER — ROCURONIUM BROMIDE 10 MG/ML (PF) SYRINGE
PREFILLED_SYRINGE | INTRAVENOUS | Status: DC | PRN
  Administered 2022-08-04: 60 mg via INTRAVENOUS

## 2022-08-04 MED ORDER — BUPIVACAINE-EPINEPHRINE 0.25% -1:200000 IJ SOLN
INTRAMUSCULAR | Status: DC | PRN
  Administered 2022-08-04: 5 mL

## 2022-08-04 MED ORDER — SODIUM CHLORIDE 0.9% FLUSH
3.0000 mL | Freq: Two times a day (BID) | INTRAVENOUS | Status: DC
  Administered 2022-08-04 – 2022-08-09 (×8): 3 mL via INTRAVENOUS

## 2022-08-04 MED ORDER — POLYETHYLENE GLYCOL 3350 17 G PO PACK
17.0000 g | PACK | Freq: Every day | ORAL | Status: DC
  Administered 2022-08-04 – 2022-08-21 (×14): 17 g via ORAL
  Filled 2022-08-04 (×15): qty 1

## 2022-08-04 MED ORDER — KETOROLAC TROMETHAMINE 15 MG/ML IJ SOLN
7.5000 mg | Freq: Four times a day (QID) | INTRAMUSCULAR | Status: AC
  Administered 2022-08-04 – 2022-08-05 (×4): 7.5 mg via INTRAVENOUS
  Filled 2022-08-04 (×4): qty 1

## 2022-08-04 MED ORDER — SUFENTANIL CITRATE 50 MCG/ML IV SOLN
INTRAVENOUS | Status: DC | PRN
  Administered 2022-08-04 (×3): 5 ug via INTRAVENOUS
  Administered 2022-08-04: 20 ug via INTRAVENOUS
  Administered 2022-08-04 (×3): 5 ug via INTRAVENOUS

## 2022-08-04 MED ORDER — THROMBIN 5000 UNITS EX SOLR
OROMUCOSAL | Status: DC | PRN
  Administered 2022-08-04: 5 mL via TOPICAL

## 2022-08-04 MED ORDER — SODIUM CHLORIDE 0.9% FLUSH: 3.0000 mL | INTRAVENOUS | Status: DC | PRN

## 2022-08-04 MED ORDER — ONDANSETRON HCL 4 MG/2ML IJ SOLN
INTRAMUSCULAR | Status: DC | PRN
  Administered 2022-08-04: 4 mg via INTRAVENOUS

## 2022-08-04 MED ORDER — ONDANSETRON HCL 4 MG/2ML IJ SOLN
4.0000 mg | Freq: Four times a day (QID) | INTRAMUSCULAR | Status: DC | PRN
  Administered 2022-08-04: 4 mg via INTRAVENOUS
  Filled 2022-08-04: qty 2

## 2022-08-04 MED ORDER — SODIUM CHLORIDE 0.9 % IV SOLN: 250.0000 mL | INTRAVENOUS | Status: DC

## 2022-08-04 MED ORDER — MENTHOL 3 MG MT LOZG
1.0000 | LOZENGE | OROMUCOSAL | Status: DC | PRN
  Administered 2022-08-10: 3 mg via ORAL
  Filled 2022-08-04: qty 9

## 2022-08-04 MED ORDER — DEXAMETHASONE SODIUM PHOSPHATE 10 MG/ML IJ SOLN
INTRAMUSCULAR | Status: DC | PRN
  Administered 2022-08-04: 10 mg via INTRAVENOUS

## 2022-08-04 MED ORDER — ALBUMIN HUMAN 5 % IV SOLN: INTRAVENOUS | Status: DC | PRN

## 2022-08-04 MED ORDER — METHOCARBAMOL 500 MG PO TABS
500.0000 mg | ORAL_TABLET | Freq: Four times a day (QID) | ORAL | Status: DC | PRN
  Administered 2022-08-04 – 2022-08-21 (×34): 500 mg via ORAL
  Filled 2022-08-04 (×38): qty 1

## 2022-08-04 MED ORDER — HEPARIN SODIUM (PORCINE) 5000 UNIT/ML IJ SOLN
5000.0000 [IU] | Freq: Two times a day (BID) | INTRAMUSCULAR | Status: DC
  Administered 2022-08-05 – 2022-08-06 (×4): 5000 [IU] via SUBCUTANEOUS
  Filled 2022-08-04 (×5): qty 1

## 2022-08-04 MED ORDER — 0.9 % SODIUM CHLORIDE (POUR BTL) OPTIME
TOPICAL | Status: DC | PRN
  Administered 2022-08-04: 1000 mL

## 2022-08-04 MED ORDER — ROCURONIUM BROMIDE 10 MG/ML (PF) SYRINGE
PREFILLED_SYRINGE | INTRAVENOUS | Status: AC
  Filled 2022-08-04: qty 10

## 2022-08-04 MED ORDER — PROPOFOL 10 MG/ML IV BOLUS
INTRAVENOUS | Status: AC
  Filled 2022-08-04: qty 20

## 2022-08-04 MED ORDER — LIDOCAINE 2% (20 MG/ML) 5 ML SYRINGE
INTRAMUSCULAR | Status: AC
  Filled 2022-08-04: qty 5

## 2022-08-04 MED ORDER — LACTATED RINGERS IV SOLN: INTRAVENOUS | Status: DC

## 2022-08-04 MED ORDER — POLYETHYLENE GLYCOL 3350 17 GM/SCOOP PO POWD
17.0000 g | Freq: Every day | ORAL | Status: DC
  Filled 2022-08-04: qty 255

## 2022-08-04 MED ORDER — ONDANSETRON HCL 4 MG/2ML IJ SOLN
INTRAMUSCULAR | Status: AC
  Filled 2022-08-04: qty 2

## 2022-08-04 MED ORDER — LIDOCAINE 2% (20 MG/ML) 5 ML SYRINGE
INTRAMUSCULAR | Status: DC | PRN
  Administered 2022-08-04: 60 mg via INTRAVENOUS

## 2022-08-04 MED ORDER — HYDROMORPHONE HCL 1 MG/ML IJ SOLN
0.5000 mg | INTRAMUSCULAR | Status: DC | PRN
  Administered 2022-08-04 – 2022-08-16 (×9): 0.5 mg via INTRAVENOUS
  Filled 2022-08-04 (×9): qty 0.5

## 2022-08-04 MED ORDER — MIDAZOLAM HCL 2 MG/2ML IJ SOLN
INTRAMUSCULAR | Status: AC
  Filled 2022-08-04: qty 2

## 2022-08-04 MED ORDER — CELECOXIB 200 MG PO CAPS
200.0000 mg | ORAL_CAPSULE | Freq: Once | ORAL | Status: AC
  Administered 2022-08-04: 200 mg via ORAL

## 2022-08-04 MED ORDER — PHENOL 1.4 % MT LIQD: 1.0000 | OROMUCOSAL | Status: DC | PRN

## 2022-08-04 MED ORDER — LISINOPRIL 10 MG PO TABS
10.0000 mg | ORAL_TABLET | Freq: Every day | ORAL | Status: DC
  Administered 2022-08-04 – 2022-08-16 (×12): 10 mg via ORAL
  Filled 2022-08-04 (×13): qty 1

## 2022-08-04 MED ORDER — PHENYLEPHRINE 80 MCG/ML (10ML) SYRINGE FOR IV PUSH (FOR BLOOD PRESSURE SUPPORT)
PREFILLED_SYRINGE | INTRAVENOUS | Status: AC
  Filled 2022-08-04: qty 10

## 2022-08-04 MED ORDER — BUPIVACAINE-EPINEPHRINE (PF) 0.25% -1:200000 IJ SOLN
INTRAMUSCULAR | Status: AC
  Filled 2022-08-04: qty 30

## 2022-08-04 MED ORDER — OXYCODONE HCL 5 MG PO TABS
5.0000 mg | ORAL_TABLET | ORAL | Status: DC | PRN
  Administered 2022-08-07 – 2022-08-20 (×9): 5 mg via ORAL
  Filled 2022-08-04 (×9): qty 1

## 2022-08-04 MED ORDER — OXYCODONE HCL 5 MG PO TABS
10.0000 mg | ORAL_TABLET | ORAL | Status: DC | PRN
  Administered 2022-08-04 – 2022-08-21 (×38): 10 mg via ORAL
  Filled 2022-08-04 (×40): qty 2

## 2022-08-04 MED ORDER — DOCUSATE SODIUM 100 MG PO CAPS
100.0000 mg | ORAL_CAPSULE | Freq: Two times a day (BID) | ORAL | Status: DC
  Administered 2022-08-04 – 2022-08-21 (×30): 100 mg via ORAL
  Filled 2022-08-04 (×33): qty 1

## 2022-08-04 MED ORDER — PHENYLEPHRINE 80 MCG/ML (10ML) SYRINGE FOR IV PUSH (FOR BLOOD PRESSURE SUPPORT)
PREFILLED_SYRINGE | INTRAVENOUS | Status: DC | PRN
  Administered 2022-08-04 (×5): 160 ug via INTRAVENOUS
  Administered 2022-08-04: 80 ug via INTRAVENOUS

## 2022-08-04 MED ORDER — ACETAMINOPHEN 325 MG PO TABS
650.0000 mg | ORAL_TABLET | ORAL | Status: DC | PRN
  Administered 2022-08-06 – 2022-08-17 (×5): 650 mg via ORAL
  Filled 2022-08-04 (×6): qty 2

## 2022-08-04 SURGICAL SUPPLY — 100 items
ADH SKN CLS APL DERMABOND .7 (GAUZE/BANDAGES/DRESSINGS) ×2
ANCH SPNL 25 LMBR MIS (Anchor) ×3 IMPLANT
ANCHOR LUMBAR 25 MIS (Anchor) IMPLANT
APPLIER CLIP 11 MED OPEN (CLIP) ×1
APR CLP MED 11 20 MLT OPN (CLIP) ×1
BAG COUNTER SPONGE SURGICOUNT (BAG) ×3 IMPLANT
BAG SPNG CNTER NS LX DISP (BAG) ×4
BASKET BONE COLLECTION (BASKET) IMPLANT
BLADE CLIPPER SURG (BLADE) IMPLANT
BUR BARREL STRAIGHT FLUTE 4.0 (BURR) IMPLANT
CANISTER SUCT 3000ML PPV (MISCELLANEOUS) ×1 IMPLANT
CLIP APPLIE 11 MED OPEN (CLIP) ×2 IMPLANT
CLIP LIGATING EXTRA MED SLVR (CLIP) IMPLANT
CLIP NEUROVISION LG (CLIP) IMPLANT
COVER BACK TABLE 60X90IN (DRAPES) ×1 IMPLANT
DERMABOND ADVANCED .7 DNX12 (GAUZE/BANDAGES/DRESSINGS) ×3 IMPLANT
DRAPE C-ARM 42X72 X-RAY (DRAPES) ×2 IMPLANT
DRAPE C-ARMOR (DRAPES) ×2 IMPLANT
DRAPE LAPAROTOMY 100X72X124 (DRAPES) ×2 IMPLANT
DRSG OPSITE POSTOP 4X6 (GAUZE/BANDAGES/DRESSINGS) IMPLANT
DRSG OPSITE POSTOP 4X8 (GAUZE/BANDAGES/DRESSINGS) IMPLANT
DURAPREP 26ML APPLICATOR (WOUND CARE) ×2 IMPLANT
ELECT BLADE 4.0 EZ CLEAN MEGAD (MISCELLANEOUS) ×1
ELECT COATED BLADE 2.86 ST (ELECTRODE) ×1 IMPLANT
ELECT REM PT RETURN 9FT ADLT (ELECTROSURGICAL) ×1
ELECTRODE BLDE 4.0 EZ CLN MEGD (MISCELLANEOUS) ×1 IMPLANT
ELECTRODE REM PT RTRN 9FT ADLT (ELECTROSURGICAL) ×2 IMPLANT
GAUZE 4X4 16PLY ~~LOC~~+RFID DBL (SPONGE) IMPLANT
GAUZE SPONGE 4X4 12PLY STRL (GAUZE/BANDAGES/DRESSINGS) IMPLANT
GLOVE BIO SURGEON STRL SZ7 (GLOVE) ×2 IMPLANT
GLOVE BIO SURGEON STRL SZ7.5 (GLOVE) ×1 IMPLANT
GLOVE BIOGEL PI IND STRL 7.0 (GLOVE) ×2 IMPLANT
GLOVE BIOGEL PI IND STRL 8 (GLOVE) ×4 IMPLANT
GLOVE ECLIPSE 8.0 STRL XLNG CF (GLOVE) ×2 IMPLANT
GLOVE EXAM NITRILE XL STR (GLOVE) IMPLANT
GLOVE SURG LTX SZ8 (GLOVE) ×1 IMPLANT
GOWN STRL REUS W/ TWL LRG LVL3 (GOWN DISPOSABLE) IMPLANT
GOWN STRL REUS W/ TWL XL LVL3 (GOWN DISPOSABLE) ×6 IMPLANT
GOWN STRL REUS W/TWL 2XL LVL3 (GOWN DISPOSABLE) ×2 IMPLANT
GOWN STRL REUS W/TWL LRG LVL3 (GOWN DISPOSABLE)
GOWN STRL REUS W/TWL XL LVL3 (GOWN DISPOSABLE) ×6
GRAFT TRINITY ELITE LGE HUMAN (Tissue) IMPLANT
HEMOSTAT POWDER KIT SURGIFOAM (HEMOSTASIS) ×1 IMPLANT
HEMOSTAT POWDER SURGIFOAM 1G (HEMOSTASIS) ×1 IMPLANT
INSERT FOGARTY 61MM (MISCELLANEOUS) IMPLANT
INSERT FOGARTY SM (MISCELLANEOUS) IMPLANT
IV NS IRRIG 3000ML ARTHROMATIC (IV SOLUTION) ×1 IMPLANT
KIT BASIN OR (CUSTOM PROCEDURE TRAY) ×2 IMPLANT
KIT DILATOR XLIF 5 (KITS) IMPLANT
KIT INFUSE XX SMALL 0.7CC (Orthopedic Implant) IMPLANT
KIT SURGICAL ACCESS MAXCESS 4 (KITS) IMPLANT
KIT TURNOVER KIT B (KITS) ×2 IMPLANT
MODULE NVM5 NEXT GEN EMG (NEEDLE) IMPLANT
NDL HYPO 21X1.5 ECLIPSE (NEEDLE) ×1 IMPLANT
NDL HYPO 25X1 1.5 SAFETY (NEEDLE) ×1 IMPLANT
NDL SPNL 18GX3.5 QUINCKE PK (NEEDLE) ×1 IMPLANT
NEEDLE HYPO 21X1.5 ECLIPSE (NEEDLE) IMPLANT
NEEDLE HYPO 25X1 1.5 SAFETY (NEEDLE) IMPLANT
NEEDLE SPNL 18GX3.5 QUINCKE PK (NEEDLE) ×1 IMPLANT
NS IRRIG 1000ML POUR BTL (IV SOLUTION) ×2 IMPLANT
PACK LAMINECTOMY NEURO (CUSTOM PROCEDURE TRAY) ×2 IMPLANT
PAD ARMBOARD 7.5X6 YLW CONV (MISCELLANEOUS) ×2 IMPLANT
PROBE BALL TIP NVM5 SNG USE (BALLOONS) IMPLANT
PUTTY DBM INSTAFILL CART 5CC (Putty) IMPLANT
SPACER HEDRON 26X34X13 15D (Spacer) IMPLANT
SPACER RISE-L 18X55 7-14MM (Spacer) IMPLANT
SPACER RISE-L 18X55 8-15MM6DEG (Spacer) IMPLANT
SPIKE FLUID TRANSFER (MISCELLANEOUS) ×2 IMPLANT
SPONGE INTESTINAL PEANUT (DISPOSABLE) ×2 IMPLANT
SPONGE SURGIFOAM ABS GEL SZ50 (HEMOSTASIS) ×1 IMPLANT
SPONGE T-LAP 18X18 ~~LOC~~+RFID (SPONGE) ×2 IMPLANT
SPONGE T-LAP 4X18 ~~LOC~~+RFID (SPONGE) IMPLANT
STAPLER VISISTAT 35W (STAPLE) ×1 IMPLANT
SUT PDS AB 0 CTX 36 PDP370T (SUTURE) ×1 IMPLANT
SUT PDS AB 1 CTX 36 (SUTURE) IMPLANT
SUT PROLENE 4 0 RB 1 (SUTURE)
SUT PROLENE 4-0 RB1 .5 CRCL 36 (SUTURE) IMPLANT
SUT PROLENE 5 0 CC1 (SUTURE) IMPLANT
SUT PROLENE 6 0 C 1 30 (SUTURE) IMPLANT
SUT PROLENE 6 0 CC (SUTURE) IMPLANT
SUT SILK 0 TIES 10X30 (SUTURE) IMPLANT
SUT SILK 2 0 TIES 10X30 (SUTURE) ×2 IMPLANT
SUT SILK 2 0SH CR/8 30 (SUTURE) IMPLANT
SUT SILK 3 0 TIES 17X18 (SUTURE)
SUT SILK 3 0SH CR/8 30 (SUTURE) IMPLANT
SUT SILK 3-0 18XBRD TIE BLK (SUTURE) IMPLANT
SUT VIC AB 1 CT1 18XBRD ANBCTR (SUTURE) IMPLANT
SUT VIC AB 1 CT1 8-18 (SUTURE)
SUT VIC AB 2-0 CP2 18 (SUTURE) ×1 IMPLANT
SUT VIC AB 2-0 CT1 18 (SUTURE) ×1 IMPLANT
SUT VIC AB 3-0 SH 8-18 (SUTURE) ×2 IMPLANT
SUT VICRYL 4-0 PS2 18IN ABS (SUTURE) IMPLANT
SYR 20ML LL LF (SYRINGE) ×1 IMPLANT
TAPE CLOTH 3X10 TAN LF (GAUZE/BANDAGES/DRESSINGS) ×3 IMPLANT
TIP CART INSTAFILL GL 5CC (ORTHOPEDIC DISPOSABLE SUPPLIES) IMPLANT
TIP CONICAL INSTAFILL (ORTHOPEDIC DISPOSABLE SUPPLIES) IMPLANT
TOWEL GREEN STERILE (TOWEL DISPOSABLE) ×3 IMPLANT
TOWEL GREEN STERILE FF (TOWEL DISPOSABLE) ×2 IMPLANT
TRAY FOLEY MTR SLVR 16FR STAT (SET/KITS/TRAYS/PACK) ×2 IMPLANT
WATER STERILE IRR 1000ML POUR (IV SOLUTION) ×2 IMPLANT

## 2022-08-04 NOTE — Progress Notes (Signed)
4 eyes complete with Milta Deiters RN all skin intact except for the surgical sights midline abdomen and left flank.

## 2022-08-04 NOTE — H&P (Signed)
History and Physical Interval Note:  08/04/2022 7:19 AM  John Mcclain  has presented today for surgery, with the diagnosis of SPONDYLOLISTHESIS, LUMBAR REGION.  The various methods of treatment have been discussed with the patient and family. After consideration of risks, benefits and other options for treatment, the patient has consented to  Procedure(s): ALIF L5-S1 (N/A) ANTERIOR XLIF L23, L34, L45 (N/A) ABDOMINAL EXPOSURE (N/A) as a surgical intervention.  The patient's history has been reviewed, patient examined, no change in status, stable for surgery.  I have reviewed the patient's chart and labs.  Questions were answered to the patient's satisfaction.     Marty Heck     Patient name: John Mcclain   MRN: [id number]        DOB: [birth year]            Sex: male   REASON FOR CONSULT: Discuss anterior spine exposure for L5-S1 ALIF   HPI: John Mcclain is a 67 y.o. male, with history of hypertension, diabetes, chronic back pain that presents for evaluation of anterior spine exposure for L5-S1 ALIF.  He describes about 3.5 years of chronic lower back pain.  He has been evaluated by Dr. Reatha Armour and has failed conservative management.  Dr. Reatha Armour has recommended two-stage operation with stage I being an L5-S1 ALIF with an XLIF at L2-L3, L3-L4 and L4-L5 for correction of his scoliosis deformity followed by stage II with a T10 to pelvis instrumentation posteriorly.  He denies any previous abdominal surgery.       Past Medical History:  Diagnosis Date   Anxiety     Arthritis     Back pain     Chronic migraine     Depression     Diabetes mellitus without complication (HCC)     Heart murmur     Hypertension     Kidney stones     Raynaud's disease             Past Surgical History:  Procedure Laterality Date   COLON SURGERY       LYMPH NODE DISSECTION        removed lymph node   VASECTOMY               Family History  Problem Relation Age of Onset   Kidney disease Mother      Hypertension Mother     Arthritis Mother          RA   COPD Mother     Hyperlipidemia Mother     Heart attack Father     Diabetes Father     COPD Father     Hypertension Father     Migraines Brother     Stroke Maternal Grandfather     Alzheimer's disease Paternal Grandmother     Colon cancer Neg Hx     Pancreatic cancer Neg Hx     Esophageal cancer Neg Hx        SOCIAL HISTORY: Social History         Socioeconomic History   Marital status: Married      Spouse name: Not on file   Number of children: Not on file   Years of education: Not on file   Highest education level: Not on file  Occupational History   Not on file  Tobacco Use   Smoking status: Never   Smokeless tobacco: Never  Vaping Use   Vaping Use: Never used  Substance and Sexual Activity   Alcohol use:  Yes      Comment: 2-3 per week   Drug use: No   Sexual activity: Yes      Partners: Female  Other Topics Concern   Not on file  Social History Narrative    Married. Education: [employer].     Social Determinants of Health    Financial Resource Strain: Not on file  Food Insecurity: Not on file  Transportation Needs: Not on file  Physical Activity: Not on file  Stress: Not on file  Social Connections: Not on file  Intimate Partner Violence: Not on file          Allergies  Allergen Reactions   Penicillins              Current Outpatient Medications  Medication Sig Dispense Refill   alendronate (FOSAMAX) 70 MG tablet Take 70 mg by mouth once a week. Take with a full glass of water on an empty stomach.       amLODipine (NORVASC) 2.5 MG tablet TAKE 1 TABLET EVERY DAY (NEED TO SCHEDULE A 6 MONTH FOLLOW UP APPOINTMENT) 90 tablet 1   Calcium Carbonate-Vitamin D (CALCIUM 600+D PO) Take by mouth daily.       clobetasol (OLUX) 0.05 % topical foam Apply topically 2 (two) times daily. 50 g 1   cyclobenzaprine (FLEXERIL) 10 MG tablet Take 10 mg by mouth 3 (three) times daily as needed.        Fremanezumab-vfrm (AJOVY) 225 MG/1.5ML SOAJ Inject 1.5 mLs into the skin every 30 (thirty) days.       levETIRAcetam (KEPPRA) 750 MG tablet Take 750 mg by mouth 2 (two) times daily.        lisinopril (ZESTRIL) 10 MG tablet TAKE 1 TABLET EVERY DAY 90 tablet 3   LORazepam (ATIVAN) 1 MG tablet TAKE 1/2 TO 1 TABLET EVERY DAY AS NEEDED FOR ANXIETY AS SPARINGLY AS POSSIBLE 30 tablet 1   Magnesium 400 MG TABS Take 1 tablet by mouth daily as needed.       minocycline (MINOCIN) 50 MG capsule TAKE 1 CAPSULE TWICE DAILY 180 capsule 2   naproxen sodium (ALEVE) 220 MG tablet         pantoprazole (PROTONIX) 40 MG tablet Take 1 tablet (40 mg total) by mouth daily. 90 tablet 3   polyethylene glycol powder (GLYCOLAX/MIRALAX) powder Take 17 g by mouth daily. 3350 g prn   predniSONE (DELTASONE) 10 MG tablet Take 10 mg by mouth daily as needed.       pregabalin (LYRICA) 75 MG capsule TAKE 1 CAPSULE TWICE DAILY 60 capsule 1   rosuvastatin (CRESTOR) 5 MG tablet TAKE 1 TABLET EVERY DAY (NEED MD APPOINTMENT FOR REFILLS) 60 tablet 3   Upadacitinib ER (RINVOQ) 15 MG [id number] 15 mg.       folic acid (FOLVITE) 1 MG tablet Take 1 mg by mouth daily.       tadalafil (CIALIS) 5 MG tablet Take 1 tablet (5 mg total) by mouth daily as needed for erectile dysfunction. 90 tablet 2    No current facility-administered medications for this visit.      REVIEW OF SYSTEMS:  '[X]'$  denotes positive finding, '[ ]'$  denotes negative finding Cardiac   Comments:  Chest pain or chest pressure:      Shortness of breath upon exertion:      Short of breath when lying flat:      Irregular heart rhythm:             Vascular  Pain in calf, thigh, or hip brought on by ambulation:      Pain in feet at night that wakes you up from your sleep:       Blood clot in your veins:      Leg swelling:              Pulmonary      Oxygen at home:      Productive cough:       Wheezing:              Neurologic      Sudden weakness in arms or legs:        Sudden numbness in arms or legs:       Sudden onset of difficulty speaking or slurred speech:      Temporary loss of vision in one eye:       Problems with dizziness:              Gastrointestinal      Blood in stool:       Vomited blood:              Genitourinary      Burning when urinating:       Blood in urine:             Psychiatric      Major depression:              Hematologic      Bleeding problems:      Problems with blood clotting too easily:             Skin      Rashes or ulcers:             Constitutional      Fever or chills:          PHYSICAL EXAM:    Vitals:    07/04/22 1508  BP: 116/72  Pulse: 88  Resp: 16  Temp: (!) 97.4 F (36.3 C)  TempSrc: Temporal  SpO2: 98%  Weight: 150 lb (68 kg)  Height: '5\' 5"'$  (1.651 m)      GENERAL: The patient is a well-nourished male, in no acute distress. The vital signs are documented above. CARDIAC: There is a regular rate and rhythm.  VASCULAR:  Palpable femoral pulses bilaterally Palpable DP pulses bilaterally PULMONARY: No respiratory distress. ABDOMEN: Soft and non-tender.  No previous incisions. MUSCULOSKELETAL: There are no major deformities or cyanosis. NEUROLOGIC: No focal weakness or paresthesias are detected. SKIN: There are no ulcers or rashes noted. PSYCHIATRIC: The patient has a normal affect.   DATA:    MRI reviewed     Assessment/Plan:   67 year old male presents for evaluation of anterior spine exposure for L5-S1 ALIF.  I have reviewed his MRI and discussed he would be a good candidate for anterior approach.  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  Discussed mobilizing the left rectus muscle to get into the retroperitoneum and mobilize peritoneum and left ureter across midline.  Discussed moving the iliac artery and vein  to get to the disc space from the front.  Discussed risk of injury to above structures.  All questions answered.  Look forward to helping Dr. Reatha Armour.  Patient is scheduled for 08/04/2022.     Marty Heck, MD [facility] [place] Office: [phone]

## 2022-08-04 NOTE — Anesthesia Procedure Notes (Signed)
Procedure Name: Intubation Date/Time: 08/04/2022 7:57 AM  Performed by: Moshe Salisbury, CRNAPre-anesthesia Checklist: Patient identified, Emergency Drugs available, Suction available and Patient being monitored Patient Re-evaluated:Patient Re-evaluated prior to induction Oxygen Delivery Method: Circle System Utilized Preoxygenation: Pre-oxygenation with 100% oxygen Induction Type: IV induction Ventilation: Mask ventilation without difficulty Laryngoscope Size: Mac and 4 Grade View: Grade II Tube type: Oral Tube size: 8.0 mm Number of attempts: 1 Airway Equipment and Method: Stylet Placement Confirmation: ETT inserted through vocal cords under direct vision, positive ETCO2 and breath sounds checked- equal and bilateral Secured at: 23 cm Tube secured with: Tape Dental Injury: Teeth and Oropharynx as per pre-operative assessment

## 2022-08-04 NOTE — H&P (Signed)
Providing Compassionate, Quality Care - Together  NEUROSURGERY HISTORY & PHYSICAL   John Mcclain is an 67 y.o. male.   Chief Complaint: scoliosis HPI: This is a pleasant 67 year old male with a longstanding history of chronic low back pain, thoracolumbar scoliosis presents today for surgical intervention.  He has chronic lower extremity radiculopathy, he has a longstanding history of both of these, his imaging workup revealed significant coronal and sagittal imbalance due to dextroscoliosis in the lumbar spine.  He presents today for surgical intervention, as today stage I ALIF L5-S1, L LIF L2-L5.  Past Medical History:  Diagnosis Date   Anxiety    Arthritis    RA   Back pain    Chronic migraine    Depression    GERD (gastroesophageal reflux disease)    Heart murmur    History of kidney stones    Hypertension    Pneumonia    Raynaud's disease    Rheumatoid arthritis ([place])     Past Surgical History:  Procedure Laterality Date   COLON SURGERY  2000   polyps removed   COLONOSCOPY WITH ESOPHAGOGASTRODUODENOSCOPY (EGD)  2023   ESOPHAGEAL DILATION  2023   LYMPH NODE DISSECTION     removed lymph node   VASECTOMY      Family History  Problem Relation Age of Onset   Kidney disease Mother    Hypertension Mother    Arthritis Mother        RA   COPD Mother    Hyperlipidemia Mother    Heart attack Father    Diabetes Father    COPD Father    Hypertension Father    Migraines Brother    Stroke Maternal Grandfather    Alzheimer's disease Paternal Grandmother    Colon cancer Neg Hx    Pancreatic cancer Neg Hx    Esophageal cancer Neg Hx    Social History:  reports that he has never smoked. He has never used smokeless tobacco. He reports current alcohol use. He reports that he does not use drugs.  Allergies:  Allergies  Allergen Reactions   Penicillins Rash    Medications Prior to Admission  Medication Sig Dispense Refill   acetaminophen (TYLENOL) 500 MG tablet  Take 500 mg by mouth every 6 (six) hours as needed for moderate pain.     alendronate (FOSAMAX) 70 MG tablet Take 70 mg by mouth every Saturday. Take with a full glass of water on an empty stomach.     amLODipine (NORVASC) 2.5 MG tablet TAKE 1 TABLET EVERY DAY (NEED TO SCHEDULE A 6 MONTH FOLLOW UP APPOINTMENT) 90 tablet 1   Calcium Carbonate-Vitamin D (CALCIUM 600+D PO) Take 1 tablet by mouth daily.     clobetasol (OLUX) 0.05 % topical foam Apply topically 2 (two) times daily. (Patient taking differently: Apply 1 Application topically daily as needed (psoriasis).) 50 g 1   cyclobenzaprine (FLEXERIL) 10 MG tablet Take 10 mg by mouth 3 (three) times daily as needed for muscle spasms.     Fremanezumab-vfrm (AJOVY) 225 MG/1.5ML SOAJ Inject 225 mg into the skin every 30 (thirty) days.     levETIRAcetam (KEPPRA) 750 MG tablet Take 750 mg by mouth 2 (two) times daily.      lisinopril (ZESTRIL) 10 MG tablet TAKE 1 TABLET EVERY DAY 90 tablet 3   LORazepam (ATIVAN) 1 MG tablet TAKE 1/2 TO 1 TABLET EVERY DAY AS NEEDED FOR ANXIETY AS SPARINGLY AS POSSIBLE 30 tablet 1   Magnesium 400  MG TABS Take 400 mg by mouth every evening.     naproxen sodium (ALEVE) 220 MG tablet Take 220 mg by mouth daily as needed (pain).     pantoprazole (PROTONIX) 40 MG tablet Take 1 tablet (40 mg total) by mouth daily. 90 tablet 3   polyethylene glycol powder (GLYCOLAX/MIRALAX) powder Take 17 g by mouth daily. 3350 g prn   pregabalin (LYRICA) 75 MG capsule TAKE 1 CAPSULE TWICE DAILY 60 capsule 1   rosuvastatin (CRESTOR) 5 MG tablet TAKE 1 TABLET EVERY DAY (NEED MD APPOINTMENT FOR REFILLS) 60 tablet 3   minocycline (MINOCIN) 50 MG capsule TAKE 1 CAPSULE TWICE DAILY (Patient not taking: Reported on 07/27/2022) 180 capsule 2   tadalafil (CIALIS) 5 MG tablet Take 1 tablet (5 mg total) by mouth daily as needed for erectile dysfunction. (Patient not taking: Reported on 07/27/2022) 90 tablet 2   Upadacitinib ER (RINVOQ) 15 MG TB24 Take 15 mg  by mouth daily.      Results for orders placed or performed during the hospital encounter of 08/04/22 (from the past 48 hour(s))  ABO/Rh     Status: None   Collection Time: 08/04/22  6:15 AM  Result Value Ref Range   ABO/RH(D)      O POS Performed at [facility] [address], [place], [place] [address]    No results found.  ROS All pertinent positives and negatives are listed in HPI above  Blood pressure 133/87, pulse 96, temperature 97.9 F (36.6 C), temperature source Oral, resp. rate 16, height '5\' 5"'$  (1.651 m), weight 69.9 kg, SpO2 98 %. Physical Exam  Awake alert oriented x 3, no acute distress, nonlabored breathing PERRLA Cranial nerves II through XII intact Speech fluent and appropriate Bilateral upper/lower extremities 5/5 throughout Abdomen soft  Assessment/Plan 67 year old male with  Sagittal coronal imbalance due to scoliosis with radiculopathy; lumbar stenosis  -OR today for L5-S1 ALIF, L2-5 LLIF.  Tentatively planned for Tuesday for T10-pelvis posterior instrumentation and fusion.  We discussed all risks, benefits and expected outcomes as well as alternatives to treatment.  He failed multiple conservative measures.  Informed consent was obtained and witnessed.  Answered all of his questions.  Thank you for allowing me to participate in this patient's care.  Please do not hesitate to call with questions or concerns.   Elwin Sleight, [facility] Cell: [phone]

## 2022-08-04 NOTE — Transfer of Care (Signed)
Immediate Anesthesia Transfer of Care Note  Patient: John Mcclain  Procedure(s) Performed: Anterior Lumbar Interbody Fusion Lumbar Five-Sacral One ANTERIOR LATERAL INTERBODY FUSION LUMBAR TWO-THREE, LUMBAR THREE-FOUR, LUMBAR FOUR-FIVE ABDOMINAL EXPOSURE  Patient Location: PACU  Anesthesia Type:General  Level of Consciousness: drowsy and patient cooperative  Airway & Oxygen Therapy: Patient Spontanous Breathing and Patient connected to nasal cannula oxygen  Post-op Assessment: Report given to RN, Post -op Vital signs reviewed and stable, and Patient moving all extremities  Post vital signs: Reviewed and stable  Last Vitals:  Vitals Value Taken Time  BP 125/68 08/04/22 1421  Temp 36.9 C 08/04/22 1420  Pulse 90 08/04/22 1425  Resp 13 08/04/22 1425  SpO2 100 % 08/04/22 1425  Vitals shown include unvalidated device data.  Last Pain:  Vitals:   08/04/22 0624  TempSrc: Oral  PainSc: 7          Complications: No notable events documented.

## 2022-08-04 NOTE — Progress Notes (Signed)
Pharmacy Antibiotic Note  John Mcclain is a 67 y.o. male admitted on 08/04/2022 with surgical prophylaxis.  Pharmacy has been consulted for vancomycin dosing following L5-S1 ALIF. No drain placed during surgery. Adding x1 vancomycin order.  Estimated Creatinine Clearance: 56.7 mL/min (by C-G formula based on SCr of 1.07 mg/dL).    Allergies  Allergen Reactions   Penicillins Rash   Plan: Vancomycin 1000 mg IV x1  Thank you for allowing pharmacy to be a part of this patient's care.  Ardyth Harps, PharmD Clinical Pharmacist

## 2022-08-04 NOTE — Op Note (Signed)
Providing Compassionate, Quality Care - Together  Date of service: 08/04/2022   PREOP DIAGNOSIS:  Degenerative lumbar scoliosis with coronal and sagittal imbalance, stenosis and radiculopathy  POSTOP DIAGNOSIS: Same  PROCEDURE:  Stage I: Anterior lumbar interbody arthrodesis, L5-S1, with assistance of Dr. Carlis Abbott for anterior abdominal exposure Placement of anterior biomechanical device, L5-S1: Globus stand-alone interbody 13 x 26 x 34 mm, 15 degree lordosis Placement of anterior shanks, 25 mm x 3 Intraoperative use of allograft Intraoperative use of BMP  Stage II: Anterior-lateral lumbar interbody arthrodesis L2-3, L3-4, L4-5 Placement of anterior biomechanical device, L2-3: Globus 7 x 18 x 55 mm 0 degree expandable titanium interbody, L3-4 7 x 18 x 55 mm 0 degree titanium interbody both expanded to approximately 10 mm, L4-5: 8 x 18 x 55 mm 6 degree lordotic titanium interbody expanded to approximately 12 mm Intraoperative use of allograft Intraoperative use of fluoroscopy Intraoperative use of neuromonitoring  SURGEON: Dr. Pieter Partridge C. Modesty Rudy, DO  CO-SURGEON: Dr. Fortunato Curling (only for stage I: anterior exposure)  ASSISTANT: Dr. Consuella Lose, MD; second assistant: Caroline More, PA  ANESTHESIA: General Endotracheal  EBL: 100 cc  SPECIMENS: None  DRAINS: None  COMPLICATIONS: None  CONDITION: Hemodynamically stable  HISTORY: Ary Pere is a 67 y.o. male with a history of lumbar degenerative scoliosis, with sagittal and coronal imbalance, with chronic low back pain and bilateral lower extremity radiculopathy.  His radiculopathy was on the right greater than the left.  He had undergone multiple conservative measures including epidural steroid injections which failed to provide him longstanding relief.  Imaging revealed a coronal curve of dextroscoliosis 35 degrees at the apex at L3.  He did have a normal segmentation and slight anterior listhesis of L5 on S1.  There is  degenerative disc disease at L2-3, L3-4, L4-5 and L5-S1 with moderate to severe stenosis central and lateral recess at all of those levels.  There is also loss of lordosis with a PI-LL mismatch of approximately 21 degrees.  Therefore given his conservative measures, we discussed surgical intervention in the form of an ALIF L5-S1, L LIF L2-5 this stage I, with stage II being posterior instrumentation and fusion T10-pelvis with decompression L2-L5 posteriorly.  We discussed extensively risks, benefits, expected outcomes and alternatives to treatment.  Medical clearance was obtained.  I answered all of his questions.  Informed consent was obtained and witnessed.  PROCEDURE IN DETAIL: Stage I: The patient was brought to the operating room. After induction of general anesthesia, the patient was positioned on the operative table in the supine position. All pressure points were meticulously padded. Skin incision was then marked out and prepped and draped in the usual sterile fashion. Physician driven timeout was performed.  Please see Dr. Jerold Coombe operative report for abdominal exposure.  Once we had the appropriate level exposed and verified with lateral fluoroscopy, I marked the midline with Bovie electrocautery over the annulus.  I then performed an annulotomy with 15 blade bilaterally.  A radical discectomy was performed with a series of Cobb, curettes and Kerrison rongeurs down to subchondral bleeding bone.  A expandable trial was then placed in the lateral fluoroscopy was expanded up to approximately 12 mm.  The appropriate size interbody device was then selected to be 13 x 26 x 34 mm, 15 degree lordosis.  The endplates were then rasped and there was subchondral bleeding bone.  We then placed the autograft in the interbody device and placed the device under live fluoroscopy.  Once appropriate placement  was confirmed, the Henriette Combs replaced with 2 into the L5 body and 1 into the S1 body of approximately 25 mm  without difficulty.  AP and lateral fluoroscopy confirmed appropriate placement.  Hemostasis was verified with copious irrigation.  Retractors were gently let down and the neurovascular structures returned to their original position.  A crosstable abdominal x-ray was performed and cleared of any retained soft or sharp instruments.  The wound was then closed in layers, 0 PDS for anterior rectus fascia, 2-0 and 3-0 sutures for dermis.  Skin was closed with skin glue.  Drapes were then taken down, back table was kept sterile and we then focused on repositioning the patient to lateral decubitus position with the left side up.  This was for positioning in the stage II the procedure.  We positioned the patient orthogonal the left side up and secured into the bed with tape along the iliac crest, superior portion of the chest.  AP and lateral fluoroscopy confirmed orthogonal orientation with the interbody spaces at L3-4, L4-5 and L2-3.  The planned incisions were then mapped and marked.  The field was then sterilely prepped and draped in standard fashion.  A repeat physician timeout was performed.  Local anesthetic was injected into the planned incisions.  L4-5 LLIF: I then used a 10 blade and made a sharp incision through the soft tissue over the L4-5 disc space.  Using Bovie electrocautery, dissection was performed down to the external fascia.  The fascia was opened sharply with Bovie electrocautery.  Using a spreading technique I spread through the 3 layers of muscles into the retroperitoneal space confirmed by retroperitoneal fat.  I then used blunt digital dissection to confirm the retroperitoneal space by feeling inside the iliac crest and the inferior portion of the T12 rib.  I expanded my dissection and was able to palpate the psoas muscle.  I then placed the first dilator into the psoas muscle along the anterior portion under lateral fluoroscopy.  We then dilated down using neuromonitoring down to the L4-5  lateral disc space.  Neuromonitoring revealed no nearby structures.  Progressive dilation was performed and K wire was placed in the L4-5 disc base.  The retractor was selected and placed.  The posterior disc base was stimulated and there is no neurologic structure identified therefore she was placed into the disc base under AP fluoroscopy.  We then expanded the retractor, using bipolar forceps hemostasis was achieved over the annulus, annulotomy was performed with annulotomy knife.  Annulotomy was widened with Kerrison rongeurs.  Using a Cobb and AP fluoroscopy, the contralateral annulus was opened.  Then using a box cutter radical discectomy was performed followed by a series of curettes and Kerrison and pituitary rongeurs.  Subchondral bleeding bone was identified.  The above listed trial was placed and selected.  We then filled the interbody device with allograft and placed under live AP and lateral fluoroscopy.  Appropriate placement was confirmed.  This was then expanded to a height of approximately 12 mm.  This was then backfilled with further allograft.  Retractors let down, retraction time was approximately 29 minutes.  Hemostasis was achieved with Surgifoam and as a retractor was gently removed, and the psoas appeared to return to its original position.  L3-4 LLIF: I then turned my attention to the L3-4 interspace.  A separate incision was made sharply with a 10 blade down to the external fascia.  Dilator was placed through the muscle into the retroperitoneal space and into the psoas  muscle.  Lateral fluoroscopy confirmed appropriate placement, K wire was placed into the disc base.  Sequential dilators were neuromonitoring confirmed no nearby neurovascular structures.The retractor was selected and placed.  The posterior disc base was stimulated and there is no neurologic structure identified therefore she was placed into the disc base under AP fluoroscopy.  We then expanded the retractor, using bipolar  forceps hemostasis was achieved over the annulus, annulotomy was performed with annulotomy knife.  Annulotomy was widened with Kerrison rongeurs.  Using a Cobb and AP fluoroscopy, the contralateral annulus was opened.  Then using a box cutter radical discectomy was performed followed by a series of curettes and Kerrison and pituitary rongeurs.  Subchondral bleeding bone was identified.  The 37m trial was placed and selected.  We then filled the interbody device with allograft and placed under live AP and lateral fluoroscopy.  Appropriate placement was confirmed.  This was then expanded to a height of approximately 10 mm.  This was then backfilled with further allograft.  Retractors let down, retraction time was approximately 20 minutes.  Hemostasis was achieved with Surgifoam and as a retractor was gently removed, and the psoas appeared to return to its original position.  L2-3 LLIF: I then turned my attention to the L2-3 interspace.  Dilator was placed through the muscle into the retroperitoneal space and into the psoas muscle.  Lateral fluoroscopy confirmed appropriate placement, K wire was placed into the disc base.  Sequential dilators were neuromonitoring confirmed no nearby neurovascular structures.The retractor was selected and placed.  The posterior disc base was stimulated and there is no neurologic structure identified therefore she was placed into the disc base under AP fluoroscopy.  We then expanded the retractor, using bipolar forceps hemostasis was achieved over the annulus, annulotomy was performed with annulotomy knife.  Annulotomy was widened with Kerrison rongeurs.  Using a Cobb and AP fluoroscopy, the contralateral annulus was opened.  Then using a box cutter radical discectomy was performed followed by a series of curettes and Kerrison and pituitary rongeurs.  Subchondral bleeding bone was identified.  The 748mtrial was placed and selected.  We then filled the interbody device with allograft  and placed under live AP and lateral fluoroscopy.  Appropriate placement was confirmed.  This was then expanded to a height of approximately 10 mm.  This was then backfilled with further allograft.  Retractors let down, retraction time was approximately 20 minutes.  Hemostasis was achieved with Surgifoam and as a retractor was gently removed, and the psoas appeared to return to its original position.  Soft tissue hemostasis was achieved with monopolar cautery.  Final AP and lateral fluoroscopy confirmed appropriate placement and no retained soft or sharp instruments.  We then closed the wounds in layers, external fascia was closed with 2-0 Vicryl suture.  Dermis was closed with 2-0 and 3-0 Vicryl sutures.  Skin was closed with skin glue.  Sterile dressing applied.  At the end of the case all sponge, needle, and instrument counts were correct. The patient was then transferred to the stretcher, extubated, and taken to the post-anesthesia care unit in stable hemodynamic condition.

## 2022-08-04 NOTE — Op Note (Signed)
Date: August 04, 2022  Preoperative diagnosis: Chronic lower back pain with thoracolumbar scoliosis  Postoperative diagnosis: Same  Procedure: Anterior spine exposure via anterior retroperitoneal approach for L5-S1 ALIF  Surgeon: Dr. Marty Heck, MD  Co-surgeon: Dr. Elwin Sleight, DO  Indications: 67 year old male with chronic lower back pain with thoracolumbar scoliosis.  Ultimately he presents for surgical management and vascular surgery was asked to assist with anterior spine exposure for L5-S1 ALI after risks and benefits discussed.  Findings: The L5-S1 disc space was marked over the left rectus muscle with a fluoroscopic C arm in the lateral position.  I performed a paramedian incision given transitional sacral anatomy to give [place] the best chance of getting the disc space exposed from the front.  Ultimately the middle sacral vessels were ligated between 2-0 silk ties and divided.  I was able to mobilize the left iliac artery and vein lateral enough to get the disc space exposed between the vessel bifurcation.  Fixed NuVasive retractor was placed.  Confirmed we were at the correct L5-S1 level on lateral fluoroscopy.  Anesthesia: General  Details: Patient was taken to the operating room after informed consent was obtained.  Placed on operative table in supine position.  General endotracheal anesthesia was induced.  Fluoroscopic C arm was then used in the lateral position to mark the L5-S1 disc space over the left rectus muscle.  The abdominal wall was then prepped and draped in standard sterile fashion.  I did plan a paramedian incision given transitional sacral anatomy.  After timeout was performed incision was made at our preoperative mark.  Dissected down with Bovie cautery through the subcutaneous tissue.  I used cerebellar retractors.  The anterior rectus sheath was opened longitudinally as well and I raised flaps underneath the anterior rectus sheath.  The left rectus muscle was  mobilized to the midline and I entered into the retroperitoneum and mobilized peritoneum and left ureter across midline.  I did take some peritoneum down off of the posterior rectus sheath and open this with Metzenbaum scissors to give [place] better exposure.  I then went to the L5-S1 disc space and dissected with blunt dissection.  The middle sacral vein was off the left iliac vein that was ligated between 2-0 silk ties and divided.  I then used suction and Kd to bluntly dissect on both sides of the disc space and we had good working room anteriorly.  A fixed NuVasive retractor was placed.  I placed 120 reverse lip retractors on each side of the disc space including mobilizing the left iliac vein lateral as well as a 120 reverse lip inferiorly.  I placed a 100 reverse lip cranial.  We had good anterior exposure.  Confirmed we were at the correct level with a needle in disc space on lateral fluoroscopy.  Case was turned over to Dr. Reatha Armour.  Complications: None  Condition: Stable  Marty Heck, MD [facility] Office: [place]

## 2022-08-04 NOTE — Anesthesia Postprocedure Evaluation (Signed)
Anesthesia Post Note  Patient: John Mcclain  Procedure(s) Performed: Anterior Lumbar Interbody Fusion Lumbar Five-Sacral One ANTERIOR LATERAL INTERBODY FUSION LUMBAR TWO-THREE, LUMBAR THREE-FOUR, LUMBAR FOUR-FIVE ABDOMINAL EXPOSURE     Patient location during evaluation: PACU Anesthesia Type: General Level of consciousness: awake and alert Pain management: pain level controlled Vital Signs Assessment: post-procedure vital signs reviewed and stable Respiratory status: spontaneous breathing, nonlabored ventilation, respiratory function stable and patient connected to nasal cannula oxygen Cardiovascular status: blood pressure returned to baseline and stable Postop Assessment: no apparent nausea or vomiting Anesthetic complications: no   No notable events documented.  Last Vitals:  Vitals:   08/04/22 1545 08/04/22 1619  BP: 135/71 133/74  Pulse: 88   Resp: 13 16  Temp: 37 C   SpO2: 99%     Last Pain:  Vitals:   08/04/22 1619  TempSrc:   PainSc: 10-Worst pain ever                 March Rummage Dawna Jakes

## 2022-08-05 LAB — CBC
HCT: 37.4 % — ABNORMAL LOW (ref 39.0–52.0)
Hemoglobin: 11.9 g/dL — ABNORMAL LOW (ref 13.0–17.0)
MCH: 30.3 pg (ref 26.0–34.0)
MCHC: 31.8 g/dL (ref 30.0–36.0)
MCV: 95.2 fL (ref 80.0–100.0)
Platelets: 314 10*3/uL (ref 150–400)
RBC: 3.93 MIL/uL — ABNORMAL LOW (ref 4.22–5.81)
RDW: 15.9 % — ABNORMAL HIGH (ref 11.5–15.5)
WBC: 19.6 10*3/uL — ABNORMAL HIGH (ref 4.0–10.5)
nRBC: 0 % (ref 0.0–0.2)

## 2022-08-05 LAB — BASIC METABOLIC PANEL
Anion gap: 8 (ref 5–15)
BUN: 14 mg/dL (ref 8–23)
CO2: 25 mmol/L (ref 22–32)
Calcium: 8.8 mg/dL — ABNORMAL LOW (ref 8.9–10.3)
Chloride: 104 mmol/L (ref 98–111)
Creatinine, Ser: 1.4 mg/dL — ABNORMAL HIGH (ref 0.61–1.24)
GFR, Estimated: 54 mL/min — ABNORMAL LOW (ref >60)
Glucose, Bld: 132 mg/dL — ABNORMAL HIGH (ref 70–99)
Potassium: 4.6 mmol/L (ref 3.5–5.1)
Sodium: 137 mmol/L (ref 135–145)

## 2022-08-05 NOTE — Progress Notes (Addendum)
  Verbal order for In and out cath PRN Q6 placed for output over 300

## 2022-08-05 NOTE — Evaluation (Signed)
Occupational Therapy Evaluation Patient Details Name: John Mcclain MRN: [id number] DOB: [birth year] Today's Date: 08/05/2022   History of Present Illness Pt is a 67 y.o male admitted 3/1 s/p anterior lumbar interbody fusion L5-S1; anterior lateral interbody fusion L2-5 on 3/1 in the treatment of dextro scoliosis in the lumbar spine. PMH significant for anxiety, RA, depression, GERD, heart murmur, HTN, Raynaud's.   Clinical Impression   PTA, pt lived with wife and was independent. Upon eval, pt with LE weakness, decreased balance, safety, and knowledge of precautions. Pt educated and demonstrating use of compensatory techniques for bed mobility, LB dressing, shower transfer, grooming and toileting. Recommending discharge home with no OT follow up pending continued appropriateness of recommendation after second portion of spinal surgery.      Recommendations for follow up therapy are one component of a multi-disciplinary discharge planning process, led by the attending physician.  Recommendations may be updated based on patient status, additional functional criteria and insurance authorization.   Follow Up Recommendations  No OT follow up (pending continued appropriateness after second portion of spinal surgery)     Assistance Recommended at Discharge Intermittent Supervision/Assistance  Patient can return home with the following A little help with walking and/or transfers;A little help with bathing/dressing/bathroom;Assistance with cooking/housework;Help with stairs or ramp for entrance;Assist for transportation    Functional Status Assessment  Patient has had a recent decline in their functional status and demonstrates the ability to make significant improvements in function in a reasonable and predictable amount of time.  Equipment Recommendations  None recommended by OT    Recommendations for Other Services       Precautions / Restrictions Precautions Precautions: Back Precaution  Booklet Issued: Yes (comment) Precaution Comments: reviewed within the context of ADL Restrictions Weight Bearing Restrictions: No      Mobility Bed Mobility               General bed mobility comments: in recliner on arrival and departure    Transfers Overall transfer level: Needs assistance Equipment used: Rolling walker (2 wheels) Transfers: Sit to/from Stand Sit to Stand: Supervision           General transfer comment: Supervision for safety, minimal use of RW upon rising.      Balance Overall balance assessment: Mild deficits observed, not formally tested                                         ADL either performed or assessed with clinical judgement   ADL Overall ADL's : Needs assistance/impaired Eating/Feeding: Independent;Sitting   Grooming: Oral care;Standing;Supervision/safety   Upper Body Bathing: Set up;Sitting   Lower Body Bathing: Supervison/ safety;Sit to/from stand   Upper Body Dressing : Set up;Sitting   Lower Body Dressing: Sit to/from stand;With adaptive equipment;Adhering to back precautions;Supervision/safety Lower Body Dressing Details (indicate cue type and reason): Pt has sock aid, shoe horn, and reacher at home Toilet Transfer: Supervision/safety;Ambulation;Comfort height toilet Toilet Transfer Details (indicate cue type and reason): simulated in room Toileting- Clothing Manipulation and Hygiene: Supervision/safety Toileting - Clothing Manipulation Details (indicate cue type and reason): reviewed compensatory techniques Tub/ Shower Transfer: Walk-in shower;Supervision/safety;Ambulation Tub/Shower Transfer Details (indicate cue type and reason): supervision for safety Functional mobility during ADLs: Supervision/safety       Vision Baseline Vision/History: 0 No visual deficits Ability to See in Adequate Light: 0 Adequate Patient Visual Report: No change from baseline  Vision Assessment?: No apparent visual  deficits     Perception     Praxis      Pertinent Vitals/Pain Pain Assessment Pain Assessment: Faces Faces Pain Scale: Hurts little more Pain Location: Rt hip, Lt surgical site Pain Descriptors / Indicators: Aching, Cramping Pain Intervention(s): Limited activity within patient's tolerance, Monitored during session     Hand Dominance Right   Extremity/Trunk Assessment Upper Extremity Assessment Upper Extremity Assessment: Overall WFL for tasks assessed   Lower Extremity Assessment Lower Extremity Assessment: Defer to PT evaluation   Cervical / Trunk Assessment Cervical / Trunk Assessment: Back Surgery   Communication Communication Communication: No difficulties   Cognition Arousal/Alertness: Awake/alert Behavior During Therapy: WFL for tasks assessed/performed Overall Cognitive Status: Within Functional Limits for tasks assessed                                       General Comments       Exercises     Shoulder Instructions      Home Living Family/patient expects to be discharged to:: Private residence Living Arrangements: Spouse/significant other Available Help at Discharge: Family;Available 24 hours/day Type of Home: House Home Access: Stairs to enter [employer] of Steps: 2 and then two more Entrance Stairs-Rails: None Home Layout: One level     Bathroom Shower/Tub: Occupational psychologist: Handicapped height Bathroom Accessibility: Yes   Home Equipment: Cane - single point;Rollator (4 wheels);Rolling Walker (2 wheels);Adaptive equipment Adaptive Equipment: Reacher;Sock aid;Long-handled shoe horn        Prior Functioning/Environment Prior Level of Function : Independent/Modified Independent;Driving             Mobility Comments: ind, used cane on occasion ADLs Comments: ind        OT Problem List: Decreased strength;Decreased activity tolerance;Impaired balance (sitting and/or standing);Decreased  knowledge of use of DME or AE;Decreased knowledge of precautions;Pain      OT Treatment/Interventions: Self-care/ADL training;Therapeutic exercise;DME and/or AE instruction;Patient/family education;Balance training;Therapeutic activities    OT Goals(Current goals can be found in the care plan section) Acute Rehab OT Goals Patient Stated Goal: go home OT Goal Formulation: With patient Time For Goal Achievement: 08/19/22 Potential to Achieve Goals: Good  OT Frequency: Min 2X/week    Co-evaluation              AM-PAC OT "6 Clicks" Daily Activity     Outcome Measure Help from another person eating meals?: None Help from another person taking care of personal grooming?: A Little Help from another person toileting, which includes using toliet, bedpan, or urinal?: A Little Help from another person bathing (including washing, rinsing, drying)?: A Little Help from another person to put on and taking off regular upper body clothing?: A Little Help from another person to put on and taking off regular lower body clothing?: A Little 6 Click Score: 19   End of Session Equipment Utilized During Treatment: Gait belt Nurse Communication: Mobility status  Activity Tolerance: Patient tolerated treatment well Patient left: in chair;with call bell/phone within reach;with family/visitor present;with chair alarm set  OT Visit Diagnosis: Unsteadiness on feet (R26.81);Muscle weakness (generalized) (M62.81);Pain Pain - part of body:  (back)                Time: [id number] OT Time Calculation (min): 29 min Charges:  OT General Charges $OT Visit: 1 Visit OT Evaluation $OT Eval Low Complexity: 1 Low  OT Treatments $Self Care/Home Management : 8-22 mins  Elder Cyphers, OTR/L [facility] Acute Rehabilitation Office: [phone]   Magnus Ivan 08/05/2022, 3:16 PM

## 2022-08-05 NOTE — Evaluation (Signed)
Physical Therapy Evaluation Patient Details Name: John Mcclain MRN: [id number] DOB: 01/04/1956 Today's Date: 08/05/2022  History of Present Illness  Pt is a 67 y.o male admitted 3/1 s/p anterior lumbar interbody fusion L5-S1; anterior lateral interbody fusion L2-5 on 3/1 in the treatment of dextro scoliosis in the lumbar spine. PMH significant for anxiety, RA, depression, GERD, heart murmur, HTN, Raynaud's.  Clinical Impression  Patient is s/p above surgery resulting in functional limitations due to the deficits listed below (see PT Problem List). Previously independent however has started using a cane and longer distance walks were becoming limited due to pain. Multiple rounds of physical therapy reported for radiculopathy. Post-op pt requires min guard to supervision level assist with mobility, ambulating >100 feet today without AD, demonstrating only minor unsteadiness. Second part of surgery scheduled in the coming week. Wife available to assist as needed at home, but cannot physically mobilize pt. Will continue to follow and progress. Patient will benefit from skilled PT to increase their independence and safety with mobility to allow discharge to the venue listed below.           Recommendations for follow up therapy are one component of a multi-disciplinary discharge planning process, led by the attending physician.  Recommendations may be updated based on patient status, additional functional criteria and insurance authorization.  Follow Up Recommendations Home health PT (Still pending part 2 of surgery - will update as appropriate)      Assistance Recommended at Discharge Intermittent Supervision/Assistance  Patient can return home with the following  A little help with bathing/dressing/bathroom;Assistance with cooking/housework;Help with stairs or ramp for entrance;Assist for transportation    Equipment Recommendations None recommended by PT  Recommendations for Other Services        Functional Status Assessment Patient has had a recent decline in their functional status and demonstrates the ability to make significant improvements in function in a reasonable and predictable amount of time.     Precautions / Restrictions Precautions Precautions: Back Precaution Booklet Issued: Yes (comment) Precaution Comments: Reviewed precautions Restrictions Weight Bearing Restrictions: No      Mobility  Bed Mobility Overal bed mobility: Needs Assistance Bed Mobility: Rolling, Sidelying to Sit Rolling: Min guard Sidelying to sit: Min guard       General bed mobility comments: Educated on log roll technique. Patient able to rise to EOB without physical assist. Cues for technique throughout.    Transfers Overall transfer level: Needs assistance Equipment used: Rolling walker (2 wheels) Transfers: Sit to/from Stand Sit to Stand: Supervision           General transfer comment: Supervision for safety, minimal use of RW upon rising.    Ambulation/Gait Ambulation/Gait assistance: Supervision Gait Distance (Feet): 105 Feet Assistive device: None Gait Pattern/deviations: Step-through pattern, Decreased stride length, Antalgic Gait velocity: decr Gait velocity interpretation: <1.8 ft/sec, indicate of risk for recurrent falls   General Gait Details: Supervision for safety. Ambulates in hallway up to 105 feet today, with minimal unsteadiness. Slightly antalgic gait pattern with complaints of upper Rt hip pain. No buckling or overt LOB noted.  Stairs            Wheelchair Mobility    Modified Rankin (Stroke Patients Only)       Balance Overall balance assessment: Mild deficits observed, not formally tested  Pertinent Vitals/Pain Pain Assessment Pain Assessment: 0-10 Pain Score: 2  Pain Location: Rt hip, Lt surgical site Pain Descriptors / Indicators: Aching, Cramping Pain Intervention(s):  Limited activity within patient's tolerance, Monitored during session, Repositioned    Home Living Family/patient expects to be discharged to:: Private residence Living Arrangements: Spouse/significant other Available Help at Discharge: Family;Available 24 hours/day Type of Home: House Home Access: Stairs to enter Entrance Stairs-Rails: None Entrance Stairs-Number of Steps: 2 (+2)   Home Layout: One level Home Equipment: Cane - single point;Rollator (4 wheels);Rolling Walker (2 wheels)      Prior Function Prior Level of Function : Independent/Modified Independent;Driving             Mobility Comments: ind, used cane on occasion ADLs Comments: ind     Hand Dominance   Dominant Hand: Right    Extremity/Trunk Assessment   Upper Extremity Assessment Upper Extremity Assessment: Defer to OT evaluation    Lower Extremity Assessment Lower Extremity Assessment: Generalized weakness (Mainly guarded)       Communication   Communication: No difficulties  Cognition Arousal/Alertness: Awake/alert Behavior During Therapy: WFL for tasks assessed/performed Overall Cognitive Status: Within Functional Limits for tasks assessed                                          General Comments      Exercises General Exercises - Lower Extremity Ankle Circles/Pumps: AROM, Both, 10 reps, Seated Quad Sets: Strengthening, Both, 10 reps, Seated Gluteal Sets: Strengthening, Both, 15 reps, Seated   Assessment/Plan    PT Assessment Patient needs continued PT services  PT Problem List Decreased strength;Decreased range of motion;Decreased activity tolerance;Decreased balance;Decreased mobility;Decreased knowledge of use of DME;Decreased knowledge of precautions;Pain       PT Treatment Interventions DME instruction;Gait training;Stair training;Functional mobility training;Therapeutic activities;Therapeutic exercise;Balance training;Neuromuscular re-education;Patient/family  education;Modalities    PT Goals (Current goals can be found in the Care Plan section)  Acute Rehab PT Goals Patient Stated Goal: Get well PT Goal Formulation: With patient Time For Goal Achievement: 08/19/22 Potential to Achieve Goals: Good    Frequency Min 4X/week     Co-evaluation               AM-PAC PT "6 Clicks" Mobility  Outcome Measure Help needed turning from your back to your side while in a flat bed without using bedrails?: A Little Help needed moving from lying on your back to sitting on the side of a flat bed without using bedrails?: A Little Help needed moving to and from a bed to a chair (including a wheelchair)?: A Little Help needed standing up from a chair using your arms (e.g., wheelchair or bedside chair)?: A Little Help needed to walk in hospital room?: A Little Help needed climbing 3-5 steps with a railing? : A Little 6 Click Score: 18    End of Session   Activity Tolerance: Patient tolerated treatment well Patient left: in chair;with call bell/phone within reach;with chair alarm set;with SCD's reapplied Nurse Communication: Mobility status PT Visit Diagnosis: Unsteadiness on feet (R26.81);Other abnormalities of gait and mobility (R26.89);Muscle weakness (generalized) (M62.81);Difficulty in walking, not elsewhere classified (R26.2);Pain Pain - Right/Left: Right Pain - part of body: Hip (and back)    Time: [id number] PT Time Calculation (min) (ACUTE ONLY): 27 min   Charges:   PT Evaluation $PT Eval Low Complexity: 1 Low PT Treatments $Therapeutic Activity:  8-22 mins        Candie Mile, PT, DPT Physical Therapist Acute Rehabilitation Services [facility] [place] 08/05/2022, 1:23 PM

## 2022-08-05 NOTE — Progress Notes (Signed)
Dr.Nundkumar paged d/t pt complaining of urine retention, bladder scan showed 450, in and out cath done output 500

## 2022-08-05 NOTE — Progress Notes (Signed)
  NEUROSURGERY PROGRESS NOTE   No issues overnight. Pt reports appropriate back soreness, no leg pain or weakness  EXAM:  BP 128/69 (BP Location: Left Arm)   Pulse 92   Temp 98 F (36.7 C) (Oral)   Resp 14   Ht '5\' 5"'$  (1.651 m)   Wt 69.9 kg   SpO2 96%   BMI 25.63 kg/m   Awake, alert, oriented  Speech fluent, appropriate  CN grossly intact  5/5 BUE/BLE  Wounds c/d/i  IMPRESSION:  67 y.o. male POD#1 L5-S1 ALIF/L2-4 lateral interbody fusion, doing well  PLAN: - Cont supportive care - Plan on posterior instrumentation on Tuesday   Consuella Lose, MD [facility]

## 2022-08-05 NOTE — Progress Notes (Signed)
[facility]  Subjective  -states his back is still sore.  No nausea or vomiting.   Objective (!) 145/77 85 98.3 F (36.8 C) (Oral) 16 97%  Intake/Output Summary (Last 24 hours) at 08/05/2022 0747 Last data filed at 08/05/2022 0513 Gross per 24 hour  Intake 2190 ml  Output 1075 ml  Net 1115 ml    Abdomen with appropriate postop incisional tenderness Left PT palpable  Laboratory Lab Results: No results for input(s): "WBC", "HGB", "HCT", "PLT" in the last 72 hours. BMET No results for input(s): "NA", "K", "CL", "CO2", "GLUCOSE", "BUN", "CREATININE", "CALCIUM" in the last 72 hours.  COAG No results found for: "INR", "PROTIME" No results found for: "PTT"  Assessment/Planning:  Postop day 1 status post anterior spine exposure for L5-S1 ALIF.  Abdomen with appropriate postop incisional tenderness.  Left PT palpable.  Looks good from my standpoint.  Marty Heck 08/05/2022 7:47 AM --

## 2022-08-06 MED ORDER — GUAIFENESIN ER 600 MG PO TB12
600.0000 mg | ORAL_TABLET | Freq: Two times a day (BID) | ORAL | Status: DC | PRN
  Administered 2022-08-06 – 2022-08-10 (×2): 600 mg via ORAL
  Filled 2022-08-06 (×2): qty 1

## 2022-08-06 NOTE — Progress Notes (Signed)
  NEUROSURGERY PROGRESS NOTE   No issues overnight. Pt reports appropriate back soreness, no leg pain or weakness. Voiding normally. Minimal left flank pain.  EXAM:  BP 124/63 (BP Location: Left Arm)   Pulse 100   Temp 98.5 F (36.9 C) (Oral)   Resp 17   Ht '5\' 5"'$  (1.651 m)   Wt 69.9 kg   SpO2 95%   BMI 25.63 kg/m   Awake, alert, oriented  Speech fluent, appropriate  CN grossly intact  5/5 BUE/BLE  Wounds c/d/i  IMPRESSION:  67 y.o. male POD#2 L5-S1 ALIF/L2-4 lateral interbody fusion, doing well  PLAN: - Cont supportive care - Plan on posterior instrumentation on Tuesday   Consuella Lose, MD [facility]

## 2022-08-07 ENCOUNTER — Inpatient Hospital Stay (HOSPITAL_COMMUNITY): Payer: Medicare PPO

## 2022-08-07 LAB — BASIC METABOLIC PANEL
Anion gap: 4 — ABNORMAL LOW (ref 5–15)
BUN: 11 mg/dL (ref 8–23)
CO2: 29 mmol/L (ref 22–32)
Calcium: 8.4 mg/dL — ABNORMAL LOW (ref 8.9–10.3)
Chloride: 102 mmol/L (ref 98–111)
Creatinine, Ser: 1.16 mg/dL (ref 0.61–1.24)
GFR, Estimated: >60 mL/min (ref >60)
Glucose, Bld: 157 mg/dL — ABNORMAL HIGH (ref 70–99)
Potassium: 3.8 mmol/L (ref 3.5–5.1)
Sodium: 135 mmol/L (ref 135–145)

## 2022-08-07 LAB — CBC
HCT: 34.8 % — ABNORMAL LOW (ref 39.0–52.0)
Hemoglobin: 11.5 g/dL — ABNORMAL LOW (ref 13.0–17.0)
MCH: 31.1 pg (ref 26.0–34.0)
MCHC: 33 g/dL (ref 30.0–36.0)
MCV: 94.1 fL (ref 80.0–100.0)
Platelets: 293 10*3/uL (ref 150–400)
RBC: 3.7 MIL/uL — ABNORMAL LOW (ref 4.22–5.81)
RDW: 15.9 % — ABNORMAL HIGH (ref 11.5–15.5)
WBC: 9.8 10*3/uL (ref 4.0–10.5)
nRBC: 0 % (ref 0.0–0.2)

## 2022-08-07 MED ORDER — VANCOMYCIN HCL IN DEXTROSE 1-5 GM/200ML-% IV SOLN
1000.0000 mg | INTRAVENOUS | Status: AC
  Administered 2022-08-08: 1000 mg via INTRAVENOUS
  Filled 2022-08-07: qty 200

## 2022-08-07 MED ORDER — TRANEXAMIC ACID-NACL 1000-0.7 MG/100ML-% IV SOLN: 1000.0000 mg | INTRAVENOUS | Status: DC

## 2022-08-07 NOTE — Progress Notes (Signed)
   Providing Compassionate, Quality Care - Together  NEUROSURGERY PROGRESS NOTE   S: No issues overnight.  Positive flatus, tolerating diet, positive appetite  O: EXAM:  BP 138/79 (BP Location: Left Arm)   Pulse 98   Temp 99.4 F (37.4 C) (Oral)   Resp 15   Ht '5\' 5"'$  (1.651 m)   Wt 69.9 kg   SpO2 92%   BMI 25.63 kg/m   Awake, alert, oriented  PERRL Speech fluent, appropriate  CNs grossly intact  5/5 BUE/BLE  Dressings clean dry and intact Abdomen soft, nontender  ASSESSMENT:  67 y.o. male with   Degenerative lumbar scoliosis  -Status post L5-S1 ALIF, L2-5 LLIF  PLAN: -Or tomorrow for T10-pelvis, L3-S1 decompression. -Standing x-rays to be performed today for review -CBC/BMP pending -N.p.o. at midnight, DC heparin    Thank you for allowing me to participate in this patient's care.  Please do not hesitate to call with questions or concerns.   Elwin Sleight, [facility] Cell: [phone]

## 2022-08-07 NOTE — Progress Notes (Signed)
   08/07/22 1400  Spiritual Encounters  Type of Visit Initial  Care provided to: Pt and family  Referral source Nurse (RN/NT/LPN)  Reason for visit Routine spiritual support  OnCall Visit No  Spiritual Framework  Presenting Themes Values and beliefs;Coping tools;Community and relationships  Community/Connection Family;Friend(s);Faith community;Significant other;Spiritual leader  Interventions  Spiritual Care Interventions Made Established relationship of care and support;Narrative/life review;Encouragement;Prayer   Chap responded to vague spiritual consult that did not express what Pt was seeking. Pt and wife were in the room, both in excellent spirits and quite animated.  They shared numerous stories of their lives together and also shared about the recent loss of Pt's mother.  Both Pt and wife are looking forward to Pt's second surgery tomorrow and recognize there will be a long road to recovery ahead.  They have an excellent support system of famil and friends behind them and both appear to be strong in the faith and using that to find hope and peace. Melven Sartorius prayed with Pt before leaving, and Pt's wife demonstrated that she can still hula dance at 67 y/o.

## 2022-08-07 NOTE — Progress Notes (Signed)
Physical Therapy Treatment Patient Details Name: John Mcclain MRN: [id number] DOB: 10-Jan-1956 Today's Date: 08/07/2022   History of Present Illness Pt is a 67 y.o male admitted 3/1 s/p anterior lumbar interbody fusion L5-S1; anterior lateral interbody fusion L2-5 on 3/1 in the treatment of dextro scoliosis in the lumbar spine. Pt scheduled for further back surgery on 3/5. PMH significant for anxiety, RA, depression, GERD, heart murmur, HTN, Raynaud's.    PT Comments    Continues to progress well, awaiting part 2 of surgery, scheduled for tomorrow. Increased ambulatory distance/tolerance to 180 feet today no AD, min guard level due to mild sway and drift, moderately antalgic gait pattern from Rt hip discomfort. Pt required one standing rest break to complete, educated on findings, and awareness of deviations. Encouraged RW use with staff for improved comfort/stability. Tolerated LE exercises well. Patient will continue to benefit from skilled physical therapy services to further improve independence with functional mobility.    Recommendations for follow up therapy are one component of a multi-disciplinary discharge planning process, led by the attending physician.  Recommendations may be updated based on patient status, additional functional criteria and insurance authorization.  Follow Up Recommendations  Home health PT (Still pending part 2 of surgery- will update as appropriate (may need post-acute rehab))     Assistance Recommended at Discharge Intermittent Supervision/Assistance  Patient can return home with the following A little help with bathing/dressing/bathroom;Assistance with cooking/housework;Help with stairs or ramp for entrance;Assist for transportation   Equipment Recommendations  None recommended by PT    Recommendations for Other Services       Precautions / Restrictions Precautions Precautions: Back Precaution Comments: reviewed Restrictions Weight Bearing  Restrictions: No     Mobility  Bed Mobility Overal bed mobility: Needs Assistance Bed Mobility: Rolling, Sidelying to Sit Rolling: Supervision Sidelying to sit: Supervision       General bed mobility comments: Recalls log roll technique. Supervision for safety, slower to rise but without physical help.    Transfers Overall transfer level: Needs assistance Equipment used: None Transfers: Sit to/from Stand Sit to Stand: Supervision           General transfer comment: Supervision for safety, maintains back precautions with transitions from bed, and with descent into recliner.    Ambulation/Gait Ambulation/Gait assistance: Min guard Gait Distance (Feet): 185 Feet Assistive device: None Gait Pattern/deviations: Step-through pattern, Narrow base of support, Drifts right/left, Antalgic Gait velocity: decr Gait velocity interpretation: <1.8 ft/sec, indicate of risk for recurrent falls   General Gait Details: Moderately antalgic, min guard due to intermittent drifting, avoiding too much WB through RLE. Required one standing rest break to lean against wall today. No overt buckling noted. Pt aware of sway.   Stairs             Wheelchair Mobility    Modified Rankin (Stroke Patients Only)       Balance Overall balance assessment: Mild deficits observed, not formally tested                                          Cognition Arousal/Alertness: Awake/alert Behavior During Therapy: WFL for tasks assessed/performed Overall Cognitive Status: Within Functional Limits for tasks assessed  Exercises General Exercises - Lower Extremity Hip ABduction/ADduction: Strengthening, Both, 10 reps, Seated    General Comments General comments (skin integrity, edema, etc.): Wife in room, very supportive.      Pertinent Vitals/Pain Pain Assessment Pain Assessment: Faces Faces Pain Scale: Hurts even  more Pain Location: Rt hip, surgical site and R groin Pain Descriptors / Indicators: Aching, Cramping Pain Intervention(s): Limited activity within patient's tolerance, Monitored during session, Repositioned    Home Living                          Prior Function            PT Goals (current goals can now be found in the care plan section) Acute Rehab PT Goals Patient Stated Goal: Get well PT Goal Formulation: With patient Time For Goal Achievement: 08/19/22 Potential to Achieve Goals: Good Progress towards PT goals: Progressing toward goals    Frequency    Min 4X/week      PT Plan Current plan remains appropriate    Co-evaluation              AM-PAC PT "6 Clicks" Mobility   Outcome Measure  Help needed turning from your back to your side while in a flat bed without using bedrails?: A Little Help needed moving from lying on your back to sitting on the side of a flat bed without using bedrails?: A Little Help needed moving to and from a bed to a chair (including a wheelchair)?: A Little Help needed standing up from a chair using your arms (e.g., wheelchair or bedside chair)?: A Little Help needed to walk in hospital room?: A Little Help needed climbing 3-5 steps with a railing? : A Little 6 Click Score: 18    End of Session   Activity Tolerance: Patient tolerated treatment well Patient left: in chair;with call bell/phone within reach;with family/visitor present Nurse Communication: Mobility status PT Visit Diagnosis: Unsteadiness on feet (R26.81);Other abnormalities of gait and mobility (R26.89);Muscle weakness (generalized) (M62.81);Difficulty in walking, not elsewhere classified (R26.2);Pain Pain - Right/Left: Right Pain - part of body: Hip     Time: [id number] PT Time Calculation (min) (ACUTE ONLY): 11 min  Charges:  $Gait Training: 8-22 mins                     Candie Mile, PT, DPT Physical Therapist Acute Rehabilitation Services [facility]    Ellouise Newer 08/07/2022, 4:08 PM

## 2022-08-07 NOTE — Progress Notes (Signed)
Occupational Therapy Treatment Patient Details Name: Jamarl Alcauter MRN: [id number] DOB: 16-Aug-1955 Today's Date: 08/07/2022   History of present illness Pt is a 67 y.o male admitted 3/1 s/p anterior lumbar interbody fusion L5-S1; anterior lateral interbody fusion L2-5 on 3/1 in the treatment of dextro scoliosis in the lumbar spine. Pt scheduled for further back surgery on 3/5. PMH significant for anxiety, RA, depression, GERD, heart murmur, HTN, Raynaud's.   OT comments  Pt experiencing more pain this am while he waits for second spinal surgery tomorrow (3/5). Pt able to walk to bathroom and take care of toileting and grooming needs with supervision. Spoke to pt about back precautions especially when rolling in bed.  Will reeval pt after next surgery and update functional status and d/c needs at that time.   Recommendations for follow up therapy are one component of a multi-disciplinary discharge planning process, led by the attending physician.  Recommendations may be updated based on patient status, additional functional criteria and insurance authorization.    Follow Up Recommendations  No OT follow up (need to reeval d/c plan after surgery on 3/5)     Assistance Recommended at Discharge Intermittent Supervision/Assistance  Patient can return home with the following  A little help with bathing/dressing/bathroom;Assistance with cooking/housework;Assist for transportation   Equipment Recommendations  None recommended by OT    Recommendations for Other Services      Precautions / Restrictions Precautions Precautions: Back Precaution Comments: reviewed within the context of ADL Restrictions Weight Bearing Restrictions: No       Mobility Bed Mobility Overal bed mobility: Needs Assistance Bed Mobility: Rolling, Sidelying to Sit Rolling: Supervision Sidelying to sit: Supervision       General bed mobility comments: Pt required cues for no twisting when rolling.     Transfers Overall transfer level: Needs assistance   Transfers: Sit to/from Stand Sit to Stand: Supervision           General transfer comment: No physical assist needed.     Balance Overall balance assessment: Mild deficits observed, not formally tested                                         ADL either performed or assessed with clinical judgement   ADL Overall ADL's : Needs assistance/impaired Eating/Feeding: Independent;Sitting   Grooming: Oral care;Standing;Supervision/safety                   Toilet Transfer: Supervision/safety;Ambulation;Comfort height toilet Toilet Transfer Details (indicate cue type and reason): Pt walked to bathroom. Toileting- Clothing Manipulation and Hygiene: Supervision/safety;Sit to/from stand;Cueing for back precautions       Functional mobility during ADLs: Supervision/safety General ADL Comments: Pt currently doing well with adls and at supervision level although having more pain.  Pt will need reeval after surgery scheduled for tomorrow.    Extremity/Trunk Assessment Upper Extremity Assessment Upper Extremity Assessment: Overall WFL for tasks assessed   Lower Extremity Assessment Lower Extremity Assessment: Defer to PT evaluation        Vision   Vision Assessment?: No apparent visual deficits   Perception     Praxis      Cognition Arousal/Alertness: Awake/alert Behavior During Therapy: WFL for tasks assessed/performed Overall Cognitive Status: Within Functional Limits for tasks assessed  Exercises      Shoulder Instructions       General Comments Pt limited most by pain and not wanting to do much in way of adls this am.    Pertinent Vitals/ Pain       Pain Assessment Pain Assessment: 0-10 Pain Score: 8  Pain Location: Rt hip, surgical site and R groin Pain Descriptors / Indicators: Aching, Cramping Pain Intervention(s):  Limited activity within patient's tolerance, Monitored during session, Premedicated before session, Repositioned  Home Living                                          Prior Functioning/Environment              Frequency  Min 2X/week        Progress Toward Goals  OT Goals(current goals can now be found in the care plan section)  Progress towards OT goals: Progressing toward goals  Acute Rehab OT Goals Patient Stated Goal: to get this second surgery over with OT Goal Formulation: With patient Time For Goal Achievement: 08/19/22 Potential to Achieve Goals: Good ADL Goals Pt Will Perform Grooming: with modified independence;standing Pt Will Perform Lower Body Dressing: with modified independence;sit to/from stand Pt Will Transfer to Toilet: with modified independence;ambulating;regular height toilet  Plan Discharge plan remains appropriate    Co-evaluation                 AM-PAC OT "6 Clicks" Daily Activity     Outcome Measure   Help from another person eating meals?: None Help from another person taking care of personal grooming?: None Help from another person toileting, which includes using toliet, bedpan, or urinal?: A Little Help from another person bathing (including washing, rinsing, drying)?: A Little Help from another person to put on and taking off regular upper body clothing?: None Help from another person to put on and taking off regular lower body clothing?: A Little 6 Click Score: 21    End of Session    OT Visit Diagnosis: Unsteadiness on feet (R26.81);Muscle weakness (generalized) (M62.81);Pain Pain - Right/Left: Right Pain - part of body: Hip   Activity Tolerance Patient tolerated treatment well   Patient Left in chair;with call bell/phone within reach;with family/visitor present   Nurse Communication Mobility status        Time: [id number] OT Time Calculation (min): 19 min  Charges: OT General Charges $OT Visit:  1 Visit OT Treatments $Self Care/Home Management : 8-22 mins   Glenford Peers 08/07/2022, 10:46 AM

## 2022-08-07 NOTE — Care Management Important Message (Signed)
Important Message  Patient Details  Name: John Mcclain MRN: [id number] Date of Birth: 1955/10/22   Medicare Important Message Given:  Yes     Hannah Beat 08/07/2022, 12:02 PM

## 2022-08-07 NOTE — TOC Initial Note (Incomplete)
Transition of Care [facility]) - Initial/Assessment Note    Patient Details  Name: John Mcclain MRN: [id number] Date of Birth: 03/25/1956  [facility] of [facility]) CM/SW Contact:    Sharin Mons, RN Phone Number: 08/07/2022, 2:24 PM  Clinical Narrative:                   Expected Discharge Plan: Home/Self Care Barriers to Discharge: Continued Medical Work up   Patient Goals and CMS Choice            Expected Discharge Plan and Services                                              Prior Living Arrangements/Services                       Activities of Daily Living Home Assistive Devices/Equipment: Cane (specify quad or straight), Walker (specify type) ADL Screening (condition at time of admission) Patient's cognitive ability adequate to safely complete daily activities?: Yes Is the patient deaf or have difficulty hearing?: No Does the patient have difficulty seeing, even when wearing glasses/contacts?: No Does the patient have difficulty concentrating, remembering, or making decisions?: No Patient able to express need for assistance with ADLs?: Yes Does the patient have difficulty dressing or bathing?: Yes Independently performs ADLs?: No Communication: Needs assistance Is this a change from baseline?: Change from baseline, expected to last >3 days Dressing (OT): Needs assistance Is this a change from baseline?: Change from baseline, expected to last >3 days Grooming: Independent Feeding: Independent Bathing: Needs assistance Is this a change from baseline?: Change from baseline, expected to last >3 days Toileting: Needs assistance Is this a change from baseline?: Change from baseline, expected to last >3days In/Out Bed: Needs assistance Is this a change from baseline?: Change from baseline, expected to last >3 days Walks in Home: Needs assistance Is this a change from baseline?: Change from baseline, expected to last >3 days Does the patient  have difficulty walking or climbing stairs?: Yes Weakness of Legs: Both Weakness of Arms/Hands: None  Permission Sought/Granted                  Emotional Assessment              Admission diagnosis:  Degenerative scoliosis in adult patient [M41.50] Patient Active Problem List   Diagnosis Date Noted   Degenerative scoliosis in adult patient 08/04/2022   Lumbar radiculopathy 05/03/2020   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   Spondylosis without myelopathy or radiculopathy, lumbar region 05/03/2020   Hypogonadism male 12/26/2011   Vitamin D deficiency 12/26/2011   Rheumatoid arthritis ([place]) 12/26/2011   Migraine headache disorder 12/26/2011   Anxiety disorder 12/26/2011   HTN (hypertension), benign 12/26/2011   PCP:  Horald Pollen, MD Pharmacy:   [facility] - [place], [address] [place] [address] Phone: [phone] Fax: [phone] PHARMACY [id number] [place], [place] [address] [place] [address] Phone: [phone] Fax: [phone]     Social Determinants of Health (SDOH) Social History: SDOH Screenings   Food Insecurity: No Food Insecurity (08/04/2022)  Housing: Low Risk  (08/04/2022)  Transportation Needs: No Transportation Needs (08/04/2022)  Utilities: Not At Risk (08/04/2022)  Depression (PHQ2-9): Low Risk  (05/24/2022)  Tobacco Use: Low Risk  (08/04/2022)   SDOH Interventions:     Readmission Risk Interventions     No data to display

## 2022-08-08 ENCOUNTER — Inpatient Hospital Stay (HOSPITAL_COMMUNITY)
Admission: RE | Disposition: A | Discharge status: Home / Self Care | Payer: Self-pay | Source: Home / Self Care | Attending: Neurological Surgery

## 2022-08-08 ENCOUNTER — Other Ambulatory Visit: Payer: Self-pay

## 2022-08-08 ENCOUNTER — Inpatient Hospital Stay (HOSPITAL_COMMUNITY): Payer: Medicare PPO | Admitting: Anesthesiology

## 2022-08-08 ENCOUNTER — Inpatient Hospital Stay (HOSPITAL_COMMUNITY): Payer: Medicare PPO

## 2022-08-08 ENCOUNTER — Inpatient Hospital Stay (HOSPITAL_COMMUNITY): Admission: RE | Admit: 2022-08-08 | Payer: Medicare PPO | Source: Home / Self Care | Admitting: Neurological Surgery

## 2022-08-08 DIAGNOSIS — I10 Essential (primary) hypertension: Secondary | ICD-10-CM | POA: Diagnosis not present

## 2022-08-08 DIAGNOSIS — M48061 Spinal stenosis, lumbar region without neurogenic claudication: Secondary | ICD-10-CM | POA: Diagnosis not present

## 2022-08-08 DIAGNOSIS — M4156 Other secondary scoliosis, lumbar region: Secondary | ICD-10-CM | POA: Diagnosis not present

## 2022-08-08 DIAGNOSIS — M4155 Other secondary scoliosis, thoracolumbar region: Secondary | ICD-10-CM

## 2022-08-08 DIAGNOSIS — F418 Other specified anxiety disorders: Secondary | ICD-10-CM | POA: Diagnosis not present

## 2022-08-08 DIAGNOSIS — M5416 Radiculopathy, lumbar region: Secondary | ICD-10-CM | POA: Diagnosis not present

## 2022-08-08 HISTORY — PX: LAMINECTOMY WITH POSTERIOR LATERAL ARTHRODESIS LEVEL 3: SHX6337

## 2022-08-08 LAB — GLUCOSE, CAPILLARY: Glucose-Capillary: 141 mg/dL — ABNORMAL HIGH (ref 70–99)

## 2022-08-08 SURGERY — LAMINECTOMY WITH POSTERIOR LATERAL ARTHRODESIS LEVEL 3
Anesthesia: General | Site: Back

## 2022-08-08 MED ORDER — THROMBIN 20000 UNITS EX SOLR
CUTANEOUS | Status: AC
  Filled 2022-08-08: qty 20000

## 2022-08-08 MED ORDER — ALBUMIN HUMAN 5 % IV SOLN: INTRAVENOUS | Status: DC | PRN

## 2022-08-08 MED ORDER — FENTANYL CITRATE (PF) 100 MCG/2ML IJ SOLN
INTRAMUSCULAR | Status: AC
  Filled 2022-08-08: qty 2

## 2022-08-08 MED ORDER — VANCOMYCIN HCL 1250 MG/250ML IV SOLN
1250.0000 mg | INTRAVENOUS | Status: DC
  Administered 2022-08-08 – 2022-08-10 (×3): 1250 mg via INTRAVENOUS
  Filled 2022-08-08 (×3): qty 250

## 2022-08-08 MED ORDER — ACETAMINOPHEN 10 MG/ML IV SOLN: 1000.0000 mg | Freq: Once | INTRAVENOUS | Status: DC | PRN

## 2022-08-08 MED ORDER — MIDAZOLAM HCL 2 MG/2ML IJ SOLN
INTRAMUSCULAR | Status: AC
  Filled 2022-08-08: qty 2

## 2022-08-08 MED ORDER — BUPIVACAINE-EPINEPHRINE (PF) 0.25% -1:200000 IJ SOLN
INTRAMUSCULAR | Status: AC
  Filled 2022-08-08: qty 30

## 2022-08-08 MED ORDER — OXYCODONE HCL 5 MG PO TABS
ORAL_TABLET | ORAL | Status: AC
  Filled 2022-08-08: qty 2

## 2022-08-08 MED ORDER — BUPIVACAINE-EPINEPHRINE (PF) 0.25% -1:200000 IJ SOLN
INTRAMUSCULAR | Status: DC | PRN
  Administered 2022-08-08: 10 mL

## 2022-08-08 MED ORDER — SODIUM CHLORIDE 0.9 % IV SOLN: 250.0000 mL | INTRAVENOUS | Status: DC

## 2022-08-08 MED ORDER — PROPOFOL 10 MG/ML IV BOLUS
INTRAVENOUS | Status: DC | PRN
  Administered 2022-08-08: 160 mg via INTRAVENOUS

## 2022-08-08 MED ORDER — PROPOFOL 10 MG/ML IV BOLUS
INTRAVENOUS | Status: AC
  Filled 2022-08-08: qty 20

## 2022-08-08 MED ORDER — ROCURONIUM BROMIDE 10 MG/ML (PF) SYRINGE
PREFILLED_SYRINGE | INTRAVENOUS | Status: DC | PRN
  Administered 2022-08-08: 30 mg via INTRAVENOUS
  Administered 2022-08-08: 40 mg via INTRAVENOUS
  Administered 2022-08-08: 60 mg via INTRAVENOUS

## 2022-08-08 MED ORDER — THROMBIN 5000 UNITS EX SOLR
CUTANEOUS | Status: AC
  Filled 2022-08-08: qty 5000

## 2022-08-08 MED ORDER — THROMBIN 5000 UNITS EX SOLR
OROMUCOSAL | Status: DC | PRN
  Administered 2022-08-08 (×2): 5 mL via TOPICAL

## 2022-08-08 MED ORDER — SUGAMMADEX SODIUM 200 MG/2ML IV SOLN
INTRAVENOUS | Status: DC | PRN
  Administered 2022-08-08: 180 mg via INTRAVENOUS

## 2022-08-08 MED ORDER — PHENYLEPHRINE HCL-NACL 20-0.9 MG/250ML-% IV SOLN
INTRAVENOUS | Status: DC | PRN
  Administered 2022-08-08: 25 ug/min via INTRAVENOUS

## 2022-08-08 MED ORDER — VANCOMYCIN HCL 1000 MG IV SOLR
INTRAVENOUS | Status: DC | PRN
  Administered 2022-08-08: 1000 mg

## 2022-08-08 MED ORDER — LIDOCAINE-EPINEPHRINE 1 %-1:100000 IJ SOLN
INTRAMUSCULAR | Status: DC | PRN
  Administered 2022-08-08: 10 mL

## 2022-08-08 MED ORDER — LIDOCAINE 2% (20 MG/ML) 5 ML SYRINGE
INTRAMUSCULAR | Status: AC
  Filled 2022-08-08: qty 5

## 2022-08-08 MED ORDER — FENTANYL CITRATE (PF) 250 MCG/5ML IJ SOLN
INTRAMUSCULAR | Status: AC
  Filled 2022-08-08: qty 5

## 2022-08-08 MED ORDER — DEXAMETHASONE SODIUM PHOSPHATE 10 MG/ML IJ SOLN
INTRAMUSCULAR | Status: DC | PRN
  Administered 2022-08-08: 10 mg via INTRAVENOUS

## 2022-08-08 MED ORDER — KETOROLAC TROMETHAMINE 15 MG/ML IJ SOLN
7.5000 mg | Freq: Four times a day (QID) | INTRAMUSCULAR | Status: AC
  Administered 2022-08-08 – 2022-08-09 (×4): 7.5 mg via INTRAVENOUS
  Filled 2022-08-08 (×4): qty 1

## 2022-08-08 MED ORDER — ONDANSETRON HCL 4 MG/2ML IJ SOLN: 4.0000 mg | Freq: Once | INTRAMUSCULAR | Status: DC | PRN

## 2022-08-08 MED ORDER — FENTANYL CITRATE (PF) 250 MCG/5ML IJ SOLN
INTRAMUSCULAR | Status: DC | PRN
  Administered 2022-08-08: 100 ug via INTRAVENOUS
  Administered 2022-08-08 (×3): 50 ug via INTRAVENOUS

## 2022-08-08 MED ORDER — LACTATED RINGERS IV SOLN: INTRAVENOUS | Status: DC | PRN

## 2022-08-08 MED ORDER — BUPIVACAINE LIPOSOME 1.3 % IJ SUSP
INTRAMUSCULAR | Status: DC | PRN
  Administered 2022-08-08: 20 mL

## 2022-08-08 MED ORDER — MIDAZOLAM HCL 2 MG/2ML IJ SOLN
INTRAMUSCULAR | Status: DC | PRN
  Administered 2022-08-08 (×2): 1 mg via INTRAVENOUS
  Administered 2022-08-08: 2 mg via INTRAVENOUS

## 2022-08-08 MED ORDER — LIDOCAINE-EPINEPHRINE 1 %-1:100000 IJ SOLN
INTRAMUSCULAR | Status: AC
  Filled 2022-08-08: qty 1

## 2022-08-08 MED ORDER — AMISULPRIDE (ANTIEMETIC) 5 MG/2ML IV SOLN: 10.0000 mg | Freq: Once | INTRAVENOUS | Status: DC | PRN

## 2022-08-08 MED ORDER — FENTANYL CITRATE (PF) 100 MCG/2ML IJ SOLN
25.0000 ug | INTRAMUSCULAR | Status: DC | PRN
  Administered 2022-08-08 (×3): 50 ug via INTRAVENOUS

## 2022-08-08 MED ORDER — ACETAMINOPHEN 10 MG/ML IV SOLN
INTRAVENOUS | Status: DC | PRN
  Administered 2022-08-08: 1000 mg via INTRAVENOUS

## 2022-08-08 MED ORDER — KETAMINE HCL 50 MG/5ML IJ SOSY
PREFILLED_SYRINGE | INTRAMUSCULAR | Status: AC
  Filled 2022-08-08: qty 5

## 2022-08-08 MED ORDER — SODIUM CHLORIDE 0.9% FLUSH
3.0000 mL | INTRAVENOUS | Status: DC | PRN
  Administered 2022-08-21: 3 mL via INTRAVENOUS

## 2022-08-08 MED ORDER — DEXAMETHASONE SODIUM PHOSPHATE 10 MG/ML IJ SOLN
INTRAMUSCULAR | Status: AC
  Filled 2022-08-08: qty 1

## 2022-08-08 MED ORDER — SODIUM CHLORIDE 0.9% FLUSH
3.0000 mL | Freq: Two times a day (BID) | INTRAVENOUS | Status: DC
  Administered 2022-08-09 – 2022-08-21 (×19): 3 mL via INTRAVENOUS

## 2022-08-08 MED ORDER — THROMBIN 20000 UNITS EX SOLR
CUTANEOUS | Status: DC | PRN
  Administered 2022-08-08: 20 mL via TOPICAL

## 2022-08-08 MED ORDER — PHENYLEPHRINE 80 MCG/ML (10ML) SYRINGE FOR IV PUSH (FOR BLOOD PRESSURE SUPPORT)
PREFILLED_SYRINGE | INTRAVENOUS | Status: DC | PRN
  Administered 2022-08-08 (×3): 160 ug via INTRAVENOUS

## 2022-08-08 MED ORDER — ROCURONIUM BROMIDE 10 MG/ML (PF) SYRINGE
PREFILLED_SYRINGE | INTRAVENOUS | Status: AC
  Filled 2022-08-08: qty 20

## 2022-08-08 MED ORDER — 0.9 % SODIUM CHLORIDE (POUR BTL) OPTIME
TOPICAL | Status: DC | PRN
  Administered 2022-08-08: 1000 mL

## 2022-08-08 MED ORDER — KETAMINE HCL 10 MG/ML IJ SOLN
INTRAMUSCULAR | Status: DC | PRN
  Administered 2022-08-08: 10 mg via INTRAVENOUS

## 2022-08-08 MED ORDER — ONDANSETRON HCL 4 MG/2ML IJ SOLN
INTRAMUSCULAR | Status: DC | PRN
  Administered 2022-08-08: 4 mg via INTRAVENOUS

## 2022-08-08 MED ORDER — WHITE PETROLATUM EX OINT
TOPICAL_OINTMENT | CUTANEOUS | Status: DC | PRN
  Filled 2022-08-08: qty 28.35

## 2022-08-08 MED ORDER — BUPIVACAINE LIPOSOME 1.3 % IJ SUSP
INTRAMUSCULAR | Status: AC
  Filled 2022-08-08: qty 20

## 2022-08-08 MED ORDER — SODIUM CHLORIDE 0.9 % IV SOLN: INTRAVENOUS | Status: DC

## 2022-08-08 MED ORDER — VANCOMYCIN HCL 1000 MG IV SOLR
INTRAVENOUS | Status: AC
  Filled 2022-08-08: qty 20

## 2022-08-08 MED ORDER — CHLORHEXIDINE GLUCONATE CLOTH 2 % EX PADS
6.0000 | MEDICATED_PAD | Freq: Every day | CUTANEOUS | Status: DC
  Administered 2022-08-08 – 2022-08-21 (×12): 6 via TOPICAL

## 2022-08-08 MED ORDER — ONDANSETRON HCL 4 MG/2ML IJ SOLN
INTRAMUSCULAR | Status: AC
  Filled 2022-08-08: qty 2

## 2022-08-08 MED ORDER — PHENYLEPHRINE 80 MCG/ML (10ML) SYRINGE FOR IV PUSH (FOR BLOOD PRESSURE SUPPORT)
PREFILLED_SYRINGE | INTRAVENOUS | Status: AC
  Filled 2022-08-08: qty 10

## 2022-08-08 MED ORDER — LIDOCAINE 2% (20 MG/ML) 5 ML SYRINGE
INTRAMUSCULAR | Status: DC | PRN
  Administered 2022-08-08: 80 mg via INTRAVENOUS

## 2022-08-08 SURGICAL SUPPLY — 92 items
ADH SKN CLS APL DERMABOND .7 (GAUZE/BANDAGES/DRESSINGS) ×2
BAG BANDED W/RUBBER/TAPE 36X54 (MISCELLANEOUS) ×4 IMPLANT
BAG COUNTER SPONGE SURGICOUNT (BAG) ×2 IMPLANT
BAG EQP BAND 135X91 W/RBR TAPE (MISCELLANEOUS)
BAG SPNG CNTER NS LX DISP (BAG) ×2
BAND INSRT 18 STRL LF DISP RB (MISCELLANEOUS) ×4
BAND RUBBER #18 3X1/16 STRL (MISCELLANEOUS) ×4 IMPLANT
BASKET BONE COLLECTION (BASKET) IMPLANT
BUR 14 MATCH 3 (BUR) IMPLANT
BUR 2.5 MTCH HD 16 (BUR) ×2 IMPLANT
BUR MR8 14 BALL 5 (BUR) IMPLANT
BURR 14 MATCH 3 (BUR) ×2
BURR MR8 14 BALL 5 (BUR) ×2
CNTNR URN SCR LID CUP LEK RST (MISCELLANEOUS) ×2 IMPLANT
CONN SPINAL CLOSED NEX 25 (Connector) ×4 IMPLANT
CONNECTOR SPINAL CLOSED NEX 25 (Connector) IMPLANT
CONT SPEC 4OZ STRL OR WHT (MISCELLANEOUS) ×2
COVER BACK TABLE 60X90IN (DRAPES) ×2 IMPLANT
COVER MAYO STAND STRL (DRAPES) ×2 IMPLANT
COVERAGE SUPPORT O-ARM STEALTH (MISCELLANEOUS) ×2 IMPLANT
DERMABOND ADVANCED .7 DNX12 (GAUZE/BANDAGES/DRESSINGS) IMPLANT
DRAIN JACKSON RD 7FR 3/32 (WOUND CARE) IMPLANT
DRAPE C-ARMOR (DRAPES) IMPLANT
DRAPE LAPAROTOMY 100X72X124 (DRAPES) ×2 IMPLANT
DRAPE MICROSCOPE SLANT 54X150 (MISCELLANEOUS) ×2 IMPLANT
DRAPE SHEET LG 3/4 BI-LAMINATE (DRAPES) ×8 IMPLANT
DRAPE STERI IOBAN 125X83 (DRAPES) ×2 IMPLANT
DRAPE SURG 17X23 STRL (DRAPES) ×2 IMPLANT
DRSG AQUACEL AG ADV 3.5X14 (GAUZE/BANDAGES/DRESSINGS) IMPLANT
DRSG OPSITE 4X5.5 SM (GAUZE/BANDAGES/DRESSINGS) IMPLANT
DRSG TELFA 3X8 NADH STRL (GAUZE/BANDAGES/DRESSINGS) IMPLANT
DURAPREP 26ML APPLICATOR (WOUND CARE) ×2 IMPLANT
ELECT BLADE INSULATED 6.5IN (ELECTROSURGICAL) ×2
ELECT REM PT RETURN 9FT ADLT (ELECTROSURGICAL) ×2
ELECTRODE BLDE INSULATED 6.5IN (ELECTROSURGICAL) ×2 IMPLANT
ELECTRODE REM PT RTRN 9FT ADLT (ELECTROSURGICAL) ×2 IMPLANT
EVACUATOR 1/8 PVC DRAIN (DRAIN) IMPLANT
FEE COVERAGE SUPPORT O-ARM (MISCELLANEOUS) ×2 IMPLANT
GAUZE 4X4 16PLY ~~LOC~~+RFID DBL (SPONGE) IMPLANT
GAUZE SPONGE 4X4 12PLY STRL (GAUZE/BANDAGES/DRESSINGS) ×2 IMPLANT
GLOVE BIO SURGEON STRL SZ7 (GLOVE) ×8 IMPLANT
GLOVE BIOGEL PI IND STRL 7.0 (GLOVE) ×4 IMPLANT
GLOVE BIOGEL PI IND STRL 7.5 (GLOVE) ×4 IMPLANT
GLOVE BIOGEL PI IND STRL 8 (GLOVE) ×4 IMPLANT
GLOVE ECLIPSE 8.0 STRL XLNG CF (GLOVE) ×4 IMPLANT
GOWN STRL REUS W/ TWL LRG LVL3 (GOWN DISPOSABLE) IMPLANT
GOWN STRL REUS W/ TWL XL LVL3 (GOWN DISPOSABLE) ×4 IMPLANT
GOWN STRL REUS W/TWL 2XL LVL3 (GOWN DISPOSABLE) IMPLANT
GOWN STRL REUS W/TWL LRG LVL3 (GOWN DISPOSABLE) ×4
GOWN STRL REUS W/TWL XL LVL3 (GOWN DISPOSABLE) ×4
GRAFT BN 10X1XDBM MAGNIFUSE (Bone Implant) IMPLANT
GRAFT BONE MAGNIFUSE 1X10CM (Bone Implant) ×4 IMPLANT
HEMOSTAT POWDER KIT SURGIFOAM (HEMOSTASIS) IMPLANT
KIT BASIN OR (CUSTOM PROCEDURE TRAY) ×2 IMPLANT
KIT POSITION SURG JACKSON T1 (MISCELLANEOUS) ×2 IMPLANT
KIT TURNOVER KIT B (KITS) ×2 IMPLANT
MARKER SKIN DUAL TIP RULER LAB (MISCELLANEOUS) ×2 IMPLANT
MARKER SPHERE PSV REFLC NDI (MISCELLANEOUS) ×10 IMPLANT
NDL HYPO 25X1 1.5 SAFETY (NEEDLE) ×2 IMPLANT
NEEDLE HYPO 25X1 1.5 SAFETY (NEEDLE) ×2 IMPLANT
NS IRRIG 1000ML POUR BTL (IV SOLUTION) ×2 IMPLANT
PACK LAMINECTOMY NEURO (CUSTOM PROCEDURE TRAY) ×2 IMPLANT
PAD ARMBOARD 7.5X6 YLW CONV (MISCELLANEOUS) ×6 IMPLANT
PATTIES SURGICAL .5 X.5 (GAUZE/BANDAGES/DRESSINGS) IMPLANT
PATTIES SURGICAL .5 X1 (DISPOSABLE) IMPLANT
PATTIES SURGICAL 1X1 (DISPOSABLE) IMPLANT
PLAN OP SURG PATIENT SPEC (ORTHOPEDIC DISPOSABLE SUPPLIES) IMPLANT
ROD UNID EXP 6 4L (Rod) IMPLANT
SCREW BS CNCMA S 8.5X80 T/C (Screw) IMPLANT
SCREW CONNECTOR SET (Screw) IMPLANT
SCREW SET SOLERA (Screw) ×38 IMPLANT
SCREW SET SOLERA TI5.5 (Screw) IMPLANT
SCREW SOLERA 45X6.5XMA NS SPNE (Screw) IMPLANT
SCREW SOLERA 6.5X45MM (Screw) ×14 IMPLANT
SCREW SOLERA 6.5X50 (Screw) IMPLANT
SCREW SOLERA 7.5X40 (Screw) IMPLANT
SCREW SOLERA 7.5X45 (Screw) IMPLANT
SEALER BIPOLAR AQUA 6.0 (INSTRUMENTS) IMPLANT
SPIKE FLUID TRANSFER (MISCELLANEOUS) ×2 IMPLANT
SPONGE SURGIFOAM ABS GEL 100 (HEMOSTASIS) IMPLANT
SPONGE T-LAP 4X18 ~~LOC~~+RFID (SPONGE) IMPLANT
STAPLER VISISTAT 35W (STAPLE) IMPLANT
SUT STRATAFIX 1PDS 45CM VIOLET (SUTURE) IMPLANT
SUT VIC AB 0 CT1 27 (SUTURE) ×4
SUT VIC AB 0 CT1 27XBRD ANBCTR (SUTURE) ×2 IMPLANT
SUT VIC AB 2-0 CP2 18 (SUTURE) ×2 IMPLANT
SUT VIC AB 3-0 SH 8-18 (SUTURE) IMPLANT
SYR EAR/ULCER 2OZ (SYRINGE) IMPLANT
TOWEL GREEN STERILE (TOWEL DISPOSABLE) IMPLANT
TOWEL GREEN STERILE FF (TOWEL DISPOSABLE) IMPLANT
TRAY FOLEY MTR SLVR 16FR STAT (SET/KITS/TRAYS/PACK) ×2 IMPLANT
WATER STERILE IRR 1000ML POUR (IV SOLUTION) ×2 IMPLANT

## 2022-08-08 NOTE — Op Note (Signed)
   Providing Compassionate, Quality Care - Together  Date of service: {***}   PREOP DIAGNOSIS:  {***}   POSTOP DIAGNOSIS: Same  PROCEDURE: {***}  SURGEON: Dr. Pieter Partridge C. Kyree Fedorko, DO  ASSISTANT: {***}  ANESTHESIA: General Endotracheal  EBL: {***}  SPECIMENS: {***}  DRAINS: {***}  COMPLICATIONS: {***}  CONDITION: {***}  HISTORY: John Mcclain is a 67 y.o. male {***}   PROCEDURE IN DETAIL: The patient was brought to the operating room. After induction of general anesthesia, the patient was positioned on the operative table in the {***} position. All pressure points were meticulously padded. Skin incision was then marked out and prepped and draped in the usual sterile fashion. Physician driven timeout was performed.  {***}   At the end of the case all sponge, needle, and instrument counts were correct. The patient was then transferred to the stretcher, extubated, and taken to the post-anesthesia care unit in stable hemodynamic condition.

## 2022-08-08 NOTE — Transfer of Care (Signed)
Immediate Anesthesia Transfer of Care Note  Patient: John Mcclain  Procedure(s) Performed: OPEN THORACOLUMBAR INTRUMENTATION,FUSION, THORACIC TEN-PELVIS, LUMBAR THREE-SACRAL ONE DECOMPRESSION  LUMBAR TWO-LUMBAR FIVE POSTERIOR COLUMN OSTEOTOMIES (Back) Application of O-Arm  Patient Location: PACU  Anesthesia Type:General  Level of Consciousness: awake, alert , and oriented  Airway & Oxygen Therapy: Patient Spontanous Breathing and Patient connected to face mask oxygen  Post-op Assessment: Report given to RN and Post -op Vital signs reviewed and stable  Post vital signs: Reviewed and stable  Last Vitals:  Vitals Value Taken Time  BP 116/63 08/08/22 1434  Temp 36.7 C 08/08/22 1435  Pulse 85 08/08/22 1441  Resp 15 08/08/22 1441  SpO2 95 % 08/08/22 1441  Vitals shown include unvalidated device data.  Last Pain:  Vitals:   08/08/22 0619  TempSrc:   PainSc: 7       Patients Stated Pain Goal: 0 ([id number])  Complications: No notable events documented.

## 2022-08-08 NOTE — Anesthesia Preprocedure Evaluation (Addendum)
Anesthesia Evaluation  Patient identified by MRN, date of birth, ID band Patient awake    Reviewed: Allergy & Precautions, NPO status , Patient's Chart, lab work & pertinent test results  Airway Mallampati: II  TM Distance: >3 FB Neck ROM: Full    Dental no notable dental hx.    Pulmonary neg pulmonary ROS   Pulmonary exam normal        Cardiovascular hypertension, Pt. on medications Normal cardiovascular exam     Neuro/Psych  Headaches PSYCHIATRIC DISORDERS Anxiety Depression     Neuromuscular disease    GI/Hepatic Neg liver ROS,GERD  Medicated and Controlled,,  Endo/Other  negative endocrine ROS    Renal/GU negative Renal ROS     Musculoskeletal  (+) Arthritis ,    Abdominal   Peds  Hematology  (+) Blood dyscrasia, anemia   Anesthesia Other Findings IDIOPATHIC SCOLIOSIS, LUMBAR REGION  Reproductive/Obstetrics                             Anesthesia Physical Anesthesia Plan  ASA: 3  Anesthesia Plan: General   Post-op Pain Management:    Induction: Intravenous  PONV Risk Score and Plan: 2 and Treatment may vary due to age or medical condition, Ondansetron, Dexamethasone and Midazolam  Airway Management Planned: Oral ETT  Additional Equipment: ClearSight  Intra-op Plan:   Post-operative Plan: Extubation in OR  Informed Consent: I have reviewed the patients History and Physical, chart, labs and discussed the procedure including the risks, benefits and alternatives for the proposed anesthesia with the patient or authorized representative who has indicated his/her understanding and acceptance.     Dental advisory given  Plan Discussed with: CRNA  Anesthesia Plan Comments:        Anesthesia Quick Evaluation

## 2022-08-08 NOTE — Progress Notes (Signed)
   Providing Compassionate, Quality Care - Together  NEUROSURGERY PROGRESS NOTE   S: No issues overnight.   O: EXAM:  BP 122/60 (BP Location: Left Arm)   Pulse 92   Temp 99 F (37.2 C) (Oral)   Resp 15   Ht '5\' 5"'$  (1.651 m)   Wt 69.9 kg   SpO2 94%   BMI 25.63 kg/m   Awake, alert, oriented x3 PERRL Speech fluent, appropriate  CNs grossly intact  Abdomen soft 5/5 BUE/BLE   ASSESSMENT:  67 y.o. male with   Degenerative lumbar scoliosis with coronal and sagittal imbalance  Status post ALIF L5-S1, L LIF L2-5  PLAN: -Standing x-rays reviewed, appropriate correction in coronal and sagittal planes performed.  Therefore I do not believe he will need the posterior column osteotomies intraoperatively. -Or today for T10-pelvis instrumentation and fusion, L3-S1 decompression.  Informed consent was obtained and witnessed.  Answered all of his questions.  We discussed all risks, benefits and expected outcomes.    Thank you for allowing me to participate in this patient's care.  Please do not hesitate to call with questions or concerns.   Elwin Sleight, [facility] Cell: [phone]

## 2022-08-08 NOTE — Progress Notes (Signed)
Patient going to OR.    Tried to call number on paper 3 times - [phone] to give report.  Names on paper were not logged in.  Tried to page and it did not work either.

## 2022-08-08 NOTE — Progress Notes (Signed)
Pharmacy Antibiotic Note  Avri Paolicelli is a 67 y.o. male admitted on 08/04/2022 with surgical prophylaxis.  Pharmacy has been consulted for vancomycin dosing.  Appears pt has a drain in place post-op.  Plan: Vancomycin 1250 mg IV q 24 hrs. Will continue without stop date for now, f/u LOT.  Height: '5\' 5"'$  (165.1 cm) Weight: 69.9 kg (154 lb) IBW/kg (Calculated) : 61.5  Temp (24hrs), Avg:98.4 F (36.9 C), Min:98 F (36.7 C), Max:99 F (37.2 C)  Recent Labs  Lab 08/05/22 0741 08/07/22 0925  WBC 19.6* 9.8  CREATININE 1.40* 1.16    Estimated Creatinine Clearance: 52.3 mL/min (by C-G formula based on SCr of 1.16 mg/dL).    Allergies  Allergen Reactions   Penicillins Rash     Thank you for allowing pharmacy to be a part of this patient's care.  [place] Crane, Roylene Reason, BCCP Clinical Pharmacist  08/08/2022 5:36 PM   [facility] pharmacy phone numbers are listed on [email or web address]

## 2022-08-08 NOTE — Anesthesia Procedure Notes (Signed)
Procedure Name: Intubation Date/Time: 08/08/2022 8:08 AM  Performed by: Bryson Corona, CRNAPre-anesthesia Checklist: Patient identified, Emergency Drugs available, Suction available and Patient being monitored Patient Re-evaluated:Patient Re-evaluated prior to induction Oxygen Delivery Method: Circle System Utilized Preoxygenation: Pre-oxygenation with 100% oxygen Induction Type: IV induction Ventilation: Oral airway inserted - appropriate to patient size Laryngoscope Size: Mac and 4 Grade View: Grade II Tube type: Oral Tube size: 7.5 mm Number of attempts: 1 Airway Equipment and Method: Stylet and Oral airway Placement Confirmation: ETT inserted through vocal cords under direct vision, positive ETCO2 and breath sounds checked- equal and bilateral Secured at: 22 cm Tube secured with: Tape Dental Injury: Teeth and Oropharynx as per pre-operative assessment

## 2022-08-08 NOTE — Anesthesia Postprocedure Evaluation (Signed)
Anesthesia Post Note  Patient: John Mcclain  Procedure(s) Performed: OPEN THORACOLUMBAR INTRUMENTATION,FUSION, THORACIC TEN-PELVIS, LUMBAR THREE-SACRAL ONE DECOMPRESSION  LUMBAR TWO-LUMBAR FIVE POSTERIOR COLUMN OSTEOTOMIES (Back) Application of O-Arm     Patient location during evaluation: PACU Anesthesia Type: General Level of consciousness: awake Pain management: pain level controlled Vital Signs Assessment: post-procedure vital signs reviewed and stable Respiratory status: spontaneous breathing, nonlabored ventilation and respiratory function stable Cardiovascular status: blood pressure returned to baseline and stable Postop Assessment: no apparent nausea or vomiting Anesthetic complications: no   No notable events documented.  Last Vitals:  Vitals:   08/08/22 1600 08/08/22 1615  BP: (!) 141/87 (!) 141/76  Pulse: 85 90  Resp: 11 15  Temp:    SpO2: 97% 98%    Last Pain:  Vitals:   08/08/22 1615  TempSrc:   PainSc: 2                  Luther Newhouse P Dametra Whetsel

## 2022-08-09 LAB — BASIC METABOLIC PANEL
Anion gap: 5 (ref 5–15)
BUN: 18 mg/dL (ref 8–23)
CO2: 26 mmol/L (ref 22–32)
Calcium: 8 mg/dL — ABNORMAL LOW (ref 8.9–10.3)
Chloride: 103 mmol/L (ref 98–111)
Creatinine, Ser: 1.09 mg/dL (ref 0.61–1.24)
GFR, Estimated: >60 mL/min (ref >60)
Glucose, Bld: 134 mg/dL — ABNORMAL HIGH (ref 70–99)
Potassium: 4.3 mmol/L (ref 3.5–5.1)
Sodium: 134 mmol/L — ABNORMAL LOW (ref 135–145)

## 2022-08-09 LAB — CBC
HCT: 26.7 % — ABNORMAL LOW (ref 39.0–52.0)
Hemoglobin: 9.2 g/dL — ABNORMAL LOW (ref 13.0–17.0)
MCH: 31.6 pg (ref 26.0–34.0)
MCHC: 34.5 g/dL (ref 30.0–36.0)
MCV: 91.8 fL (ref 80.0–100.0)
Platelets: 308 10*3/uL (ref 150–400)
RBC: 2.91 MIL/uL — ABNORMAL LOW (ref 4.22–5.81)
RDW: 14.7 % (ref 11.5–15.5)
WBC: 15.9 10*3/uL — ABNORMAL HIGH (ref 4.0–10.5)
nRBC: 0 % (ref 0.0–0.2)

## 2022-08-09 MED ORDER — SENNOSIDES-DOCUSATE SODIUM 8.6-50 MG PO TABS
1.0000 | ORAL_TABLET | Freq: Every day | ORAL | Status: DC
  Administered 2022-08-10 – 2022-08-20 (×11): 1 via ORAL
  Filled 2022-08-09 (×12): qty 1

## 2022-08-09 MED ORDER — HEPARIN SODIUM (PORCINE) 5000 UNIT/ML IJ SOLN
5000.0000 [IU] | Freq: Two times a day (BID) | INTRAMUSCULAR | Status: DC
  Administered 2022-08-10 (×2): 5000 [IU] via SUBCUTANEOUS
  Filled 2022-08-09 (×2): qty 1

## 2022-08-09 MED FILL — Thrombin For Soln 5000 Unit: CUTANEOUS | Qty: 5000 | Status: AC

## 2022-08-09 NOTE — Progress Notes (Signed)
   Providing Compassionate, Quality Care - Together  NEUROSURGERY PROGRESS NOTE   S: No issues overnight.  Pain controlled, Hemovac in place  O: EXAM:  BP 132/75 (BP Location: Right Arm)   Pulse 90   Temp 98 F (36.7 C) (Oral)   Resp 17   Ht '5\' 5"'$  (1.651 m)   Wt 69.9 kg   SpO2 98%   BMI 25.63 kg/m   Awake, alert, oriented x 3 PERRL Speech fluent, appropriate  CNs grossly intact  5/5 BUE/BLE  Incisions clean dry and intact, Hemovac in place Abdomen slightly bloated, nontender  ASSESSMENT:  67 y.o. male with   Degenerative lumbar scoliosis, with sagittal and coronal imbalance  -Status post L2-5 LLIF, L5-S1 ALIF; T10-pelvis instrumentation and fusion  PLAN: -Continue supportive care -PT/OT -Monitor Hemovac -Subcu heparin starting tomorrow -Increase bowel regimen -Bladder scan pending    Thank you for allowing me to participate in this patient's care.  Please do not hesitate to call with questions or concerns.   Elwin Sleight, [facility] Cell: [phone]

## 2022-08-09 NOTE — Progress Notes (Signed)
Physical Therapy Treatment Patient Details Name: John Mcclain MRN: [id number] DOB: 10-17-1955 Today's Date: 08/09/2022   History of Present Illness Pt is a 67 y.o male admitted 3/1 s/p anterior lumbar interbody fusion L5-S1; anterior lateral interbody fusion L2-5 (part 1 of 2). s/p open thoracolumbar instrumentation, fusion of T10-pelvis, L3-S1 decompression, L2-5 posterior column osteotomies (part 2 of 2). PMH significant for anxiety, RA, depression, GERD, heart murmur, HTN, Raynaud's.    PT Comments    Now post-op part 2 of 2 planned surgeries. Patient without major changes in function after this procedure. He does have a bit more pain and ambulatory distance is reduced however continues to ambulate at a min guard level. RW provided today for safety, educated on use. No buckling of LEs observed. A bit more guarded and rigid as expected post-op. Complains of Rt hip/thigh, and Lt knee pain primarily.  Anticipate good recovery, likely most appropriate for HHPT if no further decline in mobility. Patient will continue to benefit from skilled physical therapy services to further improve independence with functional mobility.   Recommendations for follow up therapy are one component of a multi-disciplinary discharge planning process, led by the attending physician.  Recommendations may be updated based on patient status, additional functional criteria and insurance authorization.  Follow Up Recommendations  Home health PT (Anticipate pt will progress well during admission and HHPT likely an appropriate route.)     Assistance Recommended at Discharge Intermittent Supervision/Assistance  Patient can return home with the following A little help with bathing/dressing/bathroom;Assistance with cooking/housework;Help with stairs or ramp for entrance;Assist for transportation;A little help with walking and/or transfers   Equipment Recommendations  None recommended by PT    Recommendations for Other  Services       Precautions / Restrictions Precautions Precautions: Back Precaution Booklet Issued: Yes (comment) Precaution Comments: reviewed Restrictions Weight Bearing Restrictions: No     Mobility  Bed Mobility Overal bed mobility: Needs Assistance Bed Mobility: Rolling, Sidelying to Sit Rolling: Supervision Sidelying to sit: Supervision       General bed mobility comments: Recalls log roll technique.Use of rails, Supervision for safety, slower to rise but without physical help.    Transfers Overall transfer level: Needs assistance Equipment used: Rolling walker (2 wheels) Transfers: Sit to/from Stand Sit to Stand: Min assist           General transfer comment: CGA for guidance to rise to standing postion with cues for hand placement and light UE support on RW upon rising.    Ambulation/Gait Ambulation/Gait assistance: Min guard Gait Distance (Feet): 55 Feet Assistive device: Rolling walker (2 wheels) Gait Pattern/deviations: Step-through pattern, Drifts right/left, Antalgic, Trunk flexed Gait velocity: decr Gait velocity interpretation: <1.31 ft/sec, indicative of household ambulator   General Gait Details: Educated on safe AD use with RW for support. No buckling noted however pt reports reduced confidence. Good step-through pattern, mildly antalgic, mostly guarded. Cues for posture. Min guard for safety throughout. Further distance limited by Rt hip Lt knee pain.   Stairs             Wheelchair Mobility    Modified Rankin (Stroke Patients Only)       Balance Overall balance assessment: Needs assistance Sitting-balance support: No upper extremity supported, Feet supported Sitting balance-Leahy Scale: Fair Sitting balance - Comments: Mostly due to guarding/pain   Standing balance support: No upper extremity supported Standing balance-Leahy Scale: Fair Standing balance comment: Preference for RW today.  Cognition Arousal/Alertness: Awake/alert Behavior During Therapy: WFL for tasks assessed/performed Overall Cognitive Status: Within Functional Limits for tasks assessed                                          Exercises      General Comments        Pertinent Vitals/Pain Pain Assessment Pain Assessment: Faces Faces Pain Scale: Hurts even more Pain Location: Rt hip and thigh, Lt knee, surgical site Pain Descriptors / Indicators: Aching, Cramping, Operative site guarding Pain Intervention(s): Monitored during session, Repositioned, Limited activity within patient's tolerance    Home Living                          Prior Function            PT Goals (current goals can now be found in the care plan section) Acute Rehab PT Goals Patient Stated Goal: Get well PT Goal Formulation: With patient Time For Goal Achievement: 08/19/22 Potential to Achieve Goals: Good Progress towards PT goals: Progressing toward goals    Frequency    Min 4X/week      PT Plan Current plan remains appropriate    Co-evaluation              AM-PAC PT "6 Clicks" Mobility   Outcome Measure  Help needed turning from your back to your side while in a flat bed without using bedrails?: A Little Help needed moving from lying on your back to sitting on the side of a flat bed without using bedrails?: A Little Help needed moving to and from a bed to a chair (including a wheelchair)?: A Little Help needed standing up from a chair using your arms (e.g., wheelchair or bedside chair)?: A Little Help needed to walk in hospital room?: A Little Help needed climbing 3-5 steps with a railing? : A Little 6 Click Score: 18    End of Session Equipment Utilized During Treatment: Gait belt Activity Tolerance: Patient tolerated treatment well Patient left: in chair;with call bell/phone within reach;with chair alarm set;with SCD's reapplied   PT Visit Diagnosis: Unsteadiness on  feet (R26.81);Other abnormalities of gait and mobility (R26.89);Muscle weakness (generalized) (M62.81);Difficulty in walking, not elsewhere classified (R26.2);Pain Pain - Right/Left: Right Pain - part of body: Hip     Time: [id number] PT Time Calculation (min) (ACUTE ONLY): 37 min  Charges:  $Gait Training: 8-22 mins $Therapeutic Activity: 8-22 mins                     Candie Mile, PT, DPT Physical Therapist Acute [facility] [place]    Ellouise Newer 08/09/2022, 12:40 PM

## 2022-08-09 NOTE — Progress Notes (Signed)
Occupational Therapy Treatment Patient Details Name: John Mcclain MRN: [id number] DOB: 1956/05/10 Today's Date: 08/09/2022   History of present illness Pt is a 67 y.o male admitted 3/1 s/p anterior lumbar interbody fusion L5-S1; anterior lateral interbody fusion L2-5 (part 1 of 2). s/p open thoracolumbar instrumentation, fusion of T10-pelvis, L3-S1 decompression, L2-5 posterior column osteotomies (part 2 of 2). PMH significant for anxiety, RA, depression, GERD, heart murmur, HTN, Raynaud's.   OT comments  Pt re-evaluated due to now s/p second spinal surgery. Pt requiring min guard A and RW with cues for appropriate placement of hands and use of RW this session. Pt continues to feel overall comfortable with use of AE. Min guard and cues for shower transfer. Recommending discharge home with [place]; may progress to no OT with continued time and opportunities to practice ADL.    Recommendations for follow up therapy are one component of a multi-disciplinary discharge planning process, led by the attending physician.  Recommendations may be updated based on patient status, additional functional criteria and insurance authorization.    Follow Up Recommendations  Home health OT     Assistance Recommended at Discharge Intermittent Supervision/Assistance  Patient can return home with the following  A little help with bathing/dressing/bathroom;Assistance with cooking/housework;Assist for transportation   Equipment Recommendations  None recommended by OT    Recommendations for Other Services      Precautions / Restrictions Precautions Precautions: Back Precaution Booklet Issued: Yes (comment) Precaution Comments: reviewed Restrictions Weight Bearing Restrictions: No       Mobility Bed Mobility Overal bed mobility: Needs Assistance Bed Mobility: Rolling, Sit to Sidelying Rolling: Supervision       Sit to sidelying: Min assist General bed mobility comments: recalls log roll technique, but  poor application of sit to sidelying with attempts to roll and lie down at once.    Transfers Overall transfer level: Needs assistance Equipment used: Rolling walker (2 wheels) Transfers: Sit to/from Stand Sit to Stand: Min guard           General transfer comment: Min guard A and cues for proper hand placement     Balance Overall balance assessment: Needs assistance Sitting-balance support: No upper extremity supported, Feet supported Sitting balance-Leahy Scale: Fair Sitting balance - Comments: Mostly due to guarding/pain   Standing balance support: No upper extremity supported Standing balance-Leahy Scale: Fair Standing balance comment: Preference for RW today.                           ADL either performed or assessed with clinical judgement   ADL Overall ADL's : Needs assistance/impaired                     Lower Body Dressing: Minimal assistance;Sit to/from stand Lower Body Dressing Details (indicate cue type and reason): has AE, but currently requires support of at least one UE in standing ; Min a for pulling up pants         Tub/ Shower Transfer: Walk-in shower;Min guard;Ambulation;Rolling walker (2 wheels) Tub/Shower Transfer Details (indicate cue type and reason): for safety. Decreased ability to clear threshold this session Functional mobility during ADLs: Min guard;Rolling walker (2 wheels)      Extremity/Trunk Assessment Upper Extremity Assessment Upper Extremity Assessment: Generalized weakness (grip strength 4-/5; pt reports carpal tunnel LUE and history of injury resulting in decr cartilage in R wrist. Uses functionally.)   Lower Extremity Assessment Lower Extremity Assessment: Defer to PT evaluation  Vision   Vision Assessment?: No apparent visual deficits   Perception     Praxis      Cognition Arousal/Alertness: Awake/alert Behavior During Therapy: WFL for tasks assessed/performed Overall Cognitive Status: Within  Functional Limits for tasks assessed                                          Exercises      Shoulder Instructions       General Comments      Pertinent Vitals/ Pain       Pain Assessment Pain Assessment: Faces Faces Pain Scale: Hurts even more Pain Location: Rt hip and thigh, Lt knee, surgical site Pain Descriptors / Indicators: Aching, Cramping, Operative site guarding Pain Intervention(s): Limited activity within patient's tolerance, Monitored during session  Home Living                                          Prior Functioning/Environment              Frequency  Min 2X/week        Progress Toward Goals  OT Goals(current goals can now be found in the care plan section)  Progress towards OT goals: Progressing toward goals  Acute Rehab OT Goals Patient Stated Goal: none stated OT Goal Formulation: With patient Time For Goal Achievement: 08/19/22 Potential to Achieve Goals: Good ADL Goals Pt Will Perform Grooming: with modified independence;standing Pt Will Perform Lower Body Dressing: with modified independence;sit to/from stand Pt Will Transfer to Toilet: with modified independence;ambulating;regular height toilet  Plan Discharge plan needs to be updated    Co-evaluation                 AM-PAC OT "6 Clicks" Daily Activity     Outcome Measure   Help from another person eating meals?: None Help from another person taking care of personal grooming?: A Little Help from another person toileting, which includes using toliet, bedpan, or urinal?: A Little Help from another person bathing (including washing, rinsing, drying)?: A Little Help from another person to put on and taking off regular upper body clothing?: A Little Help from another person to put on and taking off regular lower body clothing?: A Little 6 Click Score: 19    End of Session Equipment Utilized During Treatment: Gait belt;Rolling walker (2  wheels)  OT Visit Diagnosis: Unsteadiness on feet (R26.81);Muscle weakness (generalized) (M62.81);Pain Pain - Right/Left: Right Pain - part of body: Hip   Activity Tolerance Patient tolerated treatment well   Patient Left with call bell/phone within reach;in bed;with bed alarm set   Nurse Communication Mobility status        Time: [id number] OT Time Calculation (min): 25 min  Charges: OT General Charges $OT Visit: 1 Visit OT Evaluation $OT Re-eval: 1 Re-eval OT Treatments $Self Care/Home Management : 8-22 mins  Elder Cyphers, OTR/L [facility] Acute Rehabilitation Office: [phone]   Magnus Ivan 08/09/2022, 2:49 PM

## 2022-08-10 ENCOUNTER — Encounter (HOSPITAL_COMMUNITY): Payer: Self-pay | Admitting: Neurological Surgery

## 2022-08-10 NOTE — Progress Notes (Signed)
Physical Therapy Treatment Patient Details Name: John Mcclain MRN: [id number] DOB: 1956/02/22 Today's Date: 08/10/2022   History of Present Illness Pt is a 67 y.o male admitted 3/1 s/p anterior lumbar interbody fusion L5-S1; anterior lateral interbody fusion L2-5 (part 1 of 2). s/p open thoracolumbar instrumentation, fusion of T10-pelvis, L3-S1 decompression, L2-5 posterior column osteotomies (part 2 of 2). PMH significant for anxiety, RA, depression, GERD, heart murmur, HTN, Raynaud's.    PT Comments    Worked with physical therapy focusing on bed mobility and transfer at min guard level today. Ambulatory distance limited by pain but remains at min guard level with RW for light support up to 40 feet - pain reported in Rt hip and thigh primarily. Reviewed light LE exercises which are well tolerated. Attempted to void unsuccessfully but states he did void around 7AM. Encouraged repositioning frequently, short distance ambulatory bouts with staff. Patient will continue to benefit from skilled physical therapy services to further improve independence with functional mobility.   Recommendations for follow up therapy are one component of a multi-disciplinary discharge planning process, led by the attending physician.  Recommendations may be updated based on patient status, additional functional criteria and insurance authorization.  Follow Up Recommendations  Home health PT (Anticipate pt will progress well during admission and HHPT likely an appropriate route.)     Assistance Recommended at Discharge Intermittent Supervision/Assistance  Patient can return home with the following A little help with bathing/dressing/bathroom;Assistance with cooking/housework;Help with stairs or ramp for entrance;Assist for transportation;A little help with walking and/or transfers   Equipment Recommendations  None recommended by PT    Recommendations for Other Services       Precautions / Restrictions  Precautions Precautions: Back Precaution Booklet Issued: Yes (comment) Precaution Comments: reviewed Restrictions Weight Bearing Restrictions: No     Mobility  Bed Mobility Overal bed mobility: Needs Assistance Bed Mobility: Rolling, Sidelying to Sit Rolling: Supervision Sidelying to sit: Min guard       General bed mobility comments: Practiced log roll towards left side today. A little more challenging, requiring min guard for sidelying to sit transition, and cues for technique.    Transfers Overall transfer level: Needs assistance Equipment used: Rolling walker (2 wheels) Transfers: Sit to/from Stand Sit to Stand: Min guard           General transfer comment: Min guard for safety, cues for technique. Slow to rise, with UE support on RW upon standing. Stood EOB to urinate with min guard for safety. Able to release RW.    Ambulation/Gait Ambulation/Gait assistance: Min guard Gait Distance (Feet): 40 Feet Assistive device: Rolling walker (2 wheels) Gait Pattern/deviations: Step-through pattern, Antalgic, Trunk flexed Gait velocity: decr Gait velocity interpretation: <1.31 ft/sec, indicative of household ambulator   General Gait Details: Further progression of gait with cues for posture. Good step-through pattern although decreased stride length and still fairly guarded   Stairs             Wheelchair Mobility    Modified Rankin (Stroke Patients Only)       Balance Overall balance assessment: Needs assistance Sitting-balance support: No upper extremity supported, Feet supported Sitting balance-Leahy Scale: Fair Sitting balance - Comments: Mostly due to guarding/pain   Standing balance support: No upper extremity supported Standing balance-Leahy Scale: Fair Standing balance comment: Stood to urinate with urinal, no UE support, min guard for safety.  Cognition Arousal/Alertness: Awake/alert Behavior During  Therapy: WFL for tasks assessed/performed Overall Cognitive Status: Within Functional Limits for tasks assessed                                          Exercises General Exercises - Lower Extremity Ankle Circles/Pumps: AROM, Both, 10 reps, Supine Quad Sets: Strengthening, Both, 10 reps, Seated Gluteal Sets: Strengthening, Both, 10 reps, Seated Hip ABduction/ADduction: Strengthening, Both, 5 reps, Seated    General Comments        Pertinent Vitals/Pain Pain Assessment Pain Assessment: Faces Faces Pain Scale: Hurts whole lot Pain Location: Rt hip and thigh, back surgical site Pain Descriptors / Indicators: Aching, Operative site guarding, Sore Pain Intervention(s): Limited activity within patient's tolerance, Monitored during session, Premedicated before session, Repositioned    Home Living                          Prior Function            PT Goals (current goals can now be found in the care plan section) Acute Rehab PT Goals Patient Stated Goal: Get well PT Goal Formulation: With patient Time For Goal Achievement: 08/19/22 Potential to Achieve Goals: Good Progress towards PT goals: Progressing toward goals    Frequency    Min 4X/week      PT Plan Current plan remains appropriate    Co-evaluation              AM-PAC PT "6 Clicks" Mobility   Outcome Measure  Help needed turning from your back to your side while in a flat bed without using bedrails?: A Little Help needed moving from lying on your back to sitting on the side of a flat bed without using bedrails?: A Little Help needed moving to and from a bed to a chair (including a wheelchair)?: A Little Help needed standing up from a chair using your arms (e.g., wheelchair or bedside chair)?: A Little Help needed to walk in hospital room?: A Little Help needed climbing 3-5 steps with a railing? : A Little 6 Click Score: 18    End of Session Equipment Utilized During  Treatment: Gait belt Activity Tolerance: Patient tolerated treatment well Patient left: in chair;with call bell/phone within reach;with chair alarm set;with SCD's reapplied   PT Visit Diagnosis: Unsteadiness on feet (R26.81);Other abnormalities of gait and mobility (R26.89);Muscle weakness (generalized) (M62.81);Difficulty in walking, not elsewhere classified (R26.2);Pain Pain - Right/Left: Right Pain - part of body: Hip     Time: [id number] PT Time Calculation (min) (ACUTE ONLY): 27 min  Charges:  $Gait Training: 8-22 mins $Therapeutic Activity: 8-22 mins                     Candie Mile, PT, DPT Physical Therapist Acute Rehabilitation [facility] [place]    Ellouise Newer 08/10/2022, 10:52 AM

## 2022-08-10 NOTE — Progress Notes (Signed)
     Providing Compassionate, Quality Care - Together   NEUROSURGERY PROGRESS NOTE     S: No issues overnight.  Pain controlled, Hemovac in place. Patient reports satisfactory BM overnight and denies difficulty with urination.    O: EXAM:   Awake, alert, oriented x 3 PERRL Speech fluent, appropriate  CNs grossly intact  5/5 BUE/BLE  Incisions clean dry and intact, Hemovac in place Abdomen slightly distended, nontender   ASSESSMENT:  67 y.o. male with    Degenerative lumbar scoliosis, with sagittal and coronal imbalance   -Status post L2-5 LLIF, L5-S1 ALIF; T10-pelvis instrumentation and fusion   PLAN: -Continue supportive care -PT/OT -Monitor Hemovac -Continue Subcu heparin  -Maintain bowel regimen     Caroline More [facility]

## 2022-08-10 NOTE — TOC Initial Note (Addendum)
Transition of Care [facility]) - Initial/Assessment Note    Patient Details  Name: John Mcclain MRN: [id number] Date of Birth: 01-Sep-1955  Transition of Care [facility]) CM/SW Contact:    Verdell Carmine, RN Phone Number: 08/10/2022, 3:15 PM  Clinical Narrative:                  Patient presented  with surgical intervention, -Status post L2-5 LLIF, L5-S1 ALIF; T10-pelvis instrumentation and fusion . He has a walker, shoe horn, grabber that he brought in from home and is present in room . Spoke to wife and patient at bedside, introduced self and role. Patient accepting of needing home health, wife has a concern about coming home if he cannot walk, Patient states he would like to use [facility]. Referral sent and accepted by [facility], [facility].  Will need Face to face orders for PT OT and Aide. TOC  will follow for needs, recommendations, and transitions of care   Expected Discharge Plan: [place] Barriers to Discharge: Continued Medical Work up   Patient Goals and CMS Choice            Expected Discharge Plan and Services   Discharge Planning Services: CM Consult   Living arrangements for the past 2 months: Single Family Home                 DME Arranged:  (already has walker)         HH Arranged: PT, OT HH Agency: [place] Date [employer]: 08/10/22 Time [place]: [place] Representative spoke with at [place]: [place] Arrangements/Services Living arrangements for the past 2 months: [place] with:: Spouse Patient language and need for interpreter reviewed:: Yes Do you feel safe going back to the place where you live?: Yes      Need for Family Participation in Patient Care: Yes (Comment) Care giver support system in place?: Yes (comment)   Criminal Activity/Legal Involvement Pertinent to Current Situation/Hospitalization: No - Comment as needed  Activities of Daily Living Home Assistive  Devices/Equipment: Cane (specify quad or straight), Walker (specify type) ADL Screening (condition at time of admission) Patient's cognitive ability adequate to safely complete daily activities?: Yes Is the patient deaf or have difficulty hearing?: No Does the patient have difficulty seeing, even when wearing glasses/contacts?: No Does the patient have difficulty concentrating, remembering, or making decisions?: No Patient able to express need for assistance with ADLs?: Yes Does the patient have difficulty dressing or bathing?: Yes Independently performs ADLs?: No Communication: Needs assistance Is this a change from baseline?: Change from baseline, expected to last >3 days Dressing (OT): Needs assistance Is this a change from baseline?: Change from baseline, expected to last >3 days Grooming: Independent Feeding: Independent Bathing: Needs assistance Is this a change from baseline?: Change from baseline, expected to last >3 days Toileting: Needs assistance Is this a change from baseline?: Change from baseline, expected to last >3days In/Out Bed: Needs assistance Is this a change from baseline?: Change from baseline, expected to last >3 days Walks in Home: Needs assistance Is this a change from baseline?: Change from baseline, expected to last >3 days Does the patient have difficulty walking or climbing stairs?: Yes Weakness of Legs: Both Weakness of Arms/Hands: None  Permission Sought/Granted                  Emotional Assessment Appearance:: Appears younger than stated age Attitude/Demeanor/Rapport: Gracious Affect (typically observed):  Accepting Orientation: : Oriented to Self, Oriented to Place, Oriented to  Time Alcohol / Substance Use: Not Applicable Psych Involvement: No (comment)  Admission diagnosis:  Degenerative scoliosis in adult patient [M41.50] Patient Active Problem List   Diagnosis Date Noted   Degenerative scoliosis in adult patient 08/04/2022   Lumbar  radiculopathy 05/03/2020   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   Spondylosis without myelopathy or radiculopathy, lumbar region 05/03/2020   Hypogonadism male 12/26/2011   Vitamin D deficiency 12/26/2011   Rheumatoid arthritis ([place]) 12/26/2011   Migraine headache disorder 12/26/2011   Anxiety disorder 12/26/2011   HTN (hypertension), benign 12/26/2011   PCP:  Horald Pollen, MD Pharmacy:   [facility] [address], [place] [address] Phone: [phone] Fax: [phone] PHARMACY [id number] [place], [place] [address] [place] [address] Phone: [phone] Fax: [phone]     Social Determinants of Health (SDOH) Social History: SDOH Screenings   Food Insecurity: No Food Insecurity (08/04/2022)  Housing: Low Risk  (08/04/2022)  Transportation Needs: No Transportation Needs (08/04/2022)  Utilities: Not At Risk (08/04/2022)  Depression (PHQ2-9): Low Risk  (05/24/2022)  Tobacco Use: Low Risk  (08/10/2022)   SDOH Interventions:     Readmission Risk Interventions     No data to display

## 2022-08-11 MED ORDER — SIMETHICONE 80 MG PO CHEW
80.0000 mg | CHEWABLE_TABLET | Freq: Two times a day (BID) | ORAL | Status: DC | PRN
  Administered 2022-08-11 – 2022-08-13 (×3): 80 mg via ORAL
  Filled 2022-08-11 (×4): qty 1

## 2022-08-11 MED ORDER — HEPARIN SODIUM (PORCINE) 5000 UNIT/ML IJ SOLN
5000.0000 [IU] | Freq: Three times a day (TID) | INTRAMUSCULAR | Status: DC
  Administered 2022-08-11 – 2022-08-16 (×15): 5000 [IU] via SUBCUTANEOUS
  Filled 2022-08-11 (×15): qty 1

## 2022-08-11 MED FILL — Heparin Sodium (Porcine) Inj 1000 Unit/ML: INTRAMUSCULAR | Qty: 30 | Status: AC

## 2022-08-11 MED FILL — Sodium Chloride IV Soln 0.9%: INTRAVENOUS | Qty: 2000 | Status: AC

## 2022-08-11 NOTE — Progress Notes (Signed)
Physical Therapy Treatment Patient Details Name: John Mcclain MRN: [id number] DOB: [birth year] Today's Date: 08/11/2022   History of Present Illness Pt is a 67 y.o male admitted 3/1 s/p anterior lumbar interbody fusion L5-S1; anterior lateral interbody fusion L2-5 (part 1 of 2). s/p open thoracolumbar instrumentation, fusion of T10-pelvis, L3-S1 decompression, L2-5 posterior column osteotomies (part 2 of 2). PMH significant for anxiety, RA, depression, GERD, heart murmur, HTN, Raynaud's.    PT Comments    Continued progress, ambulating slowly up to 95 feet today with RW for support. LEs stable with this device. Still pain limited, mostly back and Rt thigh. Improved independence with bed mobility and transfers. Reviewed gentle LE exercises to perform between therapy sessions. Patient will continue to benefit from skilled physical therapy services to further improve independence with functional mobility.    Recommendations for follow up therapy are one component of a multi-disciplinary discharge planning process, led by the attending physician.  Recommendations may be updated based on patient status, additional functional criteria and insurance authorization.  Follow Up Recommendations  Home health PT (Anticipate pt will progress well during admission and HHPT likely an appropriate route.)     Assistance Recommended at Discharge Intermittent Supervision/Assistance  Patient can return home with the following A little help with bathing/dressing/bathroom;Assistance with cooking/housework;Help with stairs or ramp for entrance;Assist for transportation;A little help with walking and/or transfers   Equipment Recommendations  None recommended by PT    Recommendations for Other Services       Precautions / Restrictions Precautions Precautions: Back Precaution Booklet Issued: Yes (comment) Precaution Comments: reviewed Restrictions Weight Bearing Restrictions: No     Mobility  Bed  Mobility Overal bed mobility: Needs Assistance Bed Mobility: Rolling, Sidelying to Sit Rolling: Supervision Sidelying to sit: Supervision       General bed mobility comments: Further cues for log roll, no physical assist needed rolling towards Rt side today, still using rail lightly, good push through RUE to rise from sidelying position.    Transfers Overall transfer level: Needs assistance Equipment used: Rolling walker (2 wheels) Transfers: Sit to/from Stand Sit to Stand: Min guard           General transfer comment: Min guard for safety, slow to rise, cues for technique but without phyiscal assist needed. performed from low bed setting. Performed x2    Ambulation/Gait Ambulation/Gait assistance: Supervision, Min guard Gait Distance (Feet): 95 Feet Assistive device: Rolling walker (2 wheels) Gait Pattern/deviations: Step-through pattern, Antalgic, Trunk flexed Gait velocity: decr Gait velocity interpretation: <1.31 ft/sec, indicative of household ambulator   General Gait Details: Progressing well, gradually improving posture and showing reduced antalgic pattern. Still rigid and guarded. No buckling noted. Tolerated increased distance up to 95 feet today at min guard level, emerging into supervision towards end of distance.   Stairs             Wheelchair Mobility    Modified Rankin (Stroke Patients Only)       Balance Overall balance assessment: Needs assistance Sitting-balance support: No upper extremity supported, Feet supported Sitting balance-Leahy Scale: Fair Sitting balance - Comments: Mostly due to guarding/pain   Standing balance support: No upper extremity supported Standing balance-Leahy Scale: Fair Standing balance comment: Stood to urinate with urinal, no UE support, min guard for safety.                            Cognition Arousal/Alertness: Awake/alert Behavior During Therapy: WFL for tasks  assessed/performed Overall  Cognitive Status: Within Functional Limits for tasks assessed                                          Exercises General Exercises - Lower Extremity Long Arc Quad: Strengthening, Both, 10 reps, Seated Hip ABduction/ADduction: Strengthening, Both, 10 reps, Seated    General Comments General comments (skin integrity, edema, etc.): Pt making slow and steady improvements. Limited mostly by pain.      Pertinent Vitals/Pain Pain Assessment Pain Assessment: Faces Faces Pain Scale: Hurts even more Pain Location: Rt hip and thigh, back surgical site Pain Descriptors / Indicators: Aching, Operative site guarding, Sore Pain Intervention(s): Monitored during session, Repositioned    Home Living                          Prior Function            PT Goals (current goals can now be found in the care plan section) Acute Rehab PT Goals Patient Stated Goal: Get well PT Goal Formulation: With patient Time For Goal Achievement: 08/19/22 Potential to Achieve Goals: Good Progress towards PT goals: Progressing toward goals    Frequency    Min 4X/week      PT Plan Current plan remains appropriate    Co-evaluation              AM-PAC PT "6 Clicks" Mobility   Outcome Measure  Help needed turning from your back to your side while in a flat bed without using bedrails?: A Little Help needed moving from lying on your back to sitting on the side of a flat bed without using bedrails?: A Little Help needed moving to and from a bed to a chair (including a wheelchair)?: A Little Help needed standing up from a chair using your arms (e.g., wheelchair or bedside chair)?: A Little Help needed to walk in hospital room?: A Little Help needed climbing 3-5 steps with a railing? : A Little 6 Click Score: 18    End of Session Equipment Utilized During Treatment: Gait belt Activity Tolerance: Patient tolerated treatment well Patient left: in chair;with call bell/phone  within reach;with chair alarm set;with SCD's reapplied   PT Visit Diagnosis: Unsteadiness on feet (R26.81);Other abnormalities of gait and mobility (R26.89);Muscle weakness (generalized) (M62.81);Difficulty in walking, not elsewhere classified (R26.2);Pain Pain - Right/Left: Right Pain - part of body: Hip     Time: [id number] PT Time Calculation (min) (ACUTE ONLY): 30 min  Charges:  $Gait Training: 8-22 mins $Therapeutic Activity: 8-22 mins                     Candie Mile, PT, DPT Physical Therapist Acute Rehabilitation Services [place]    Ellouise Newer 08/11/2022, 12:40 PM

## 2022-08-11 NOTE — Progress Notes (Signed)
Occupational Therapy Treatment Patient Details Name: John Mcclain MRN: [id number] DOB: Nov 05, 1955 Today's Date: 08/11/2022   History of present illness Pt is a 67 y.o male admitted 3/1 s/p anterior lumbar interbody fusion L5-S1; anterior lateral interbody fusion L2-5 (part 1 of 2). s/p open thoracolumbar instrumentation, fusion of T10-pelvis, L3-S1 decompression, L2-5 posterior column osteotomies (part 2 of 2). PMH significant for anxiety, RA, depression, GERD, heart murmur, HTN, Raynaud's.   OT comments  Pt continues to make slow steady progress toward all adl goals. Pt most limited by pain but is getting up, walking to bathroom, in hallway, toileting and grooming in bathroom. Pt continues to requires some assist with LE dressing. Pt has adaptive equipment if needed but avoiding those tasks now due to pain control. Wife can assist at home as needed. Will continue to see with focus on LE adls.   Recommendations for follow up therapy are one component of a multi-disciplinary discharge planning process, led by the attending physician.  Recommendations may be updated based on patient status, additional functional criteria and insurance authorization.    Follow Up Recommendations  Home health OT     Assistance Recommended at Discharge Intermittent Supervision/Assistance  Patient can return home with the following  A little help with bathing/dressing/bathroom;Assistance with cooking/housework;Assist for transportation   Equipment Recommendations  None recommended by OT    Recommendations for Other Services      Precautions / Restrictions Precautions Precautions: Back Precaution Booklet Issued: Yes (comment) Precaution Comments: reviewed Restrictions Weight Bearing Restrictions: No       Mobility Bed Mobility               General bed mobility comments: In chair on arrival    Transfers Overall transfer level: Needs assistance Equipment used: Rolling walker (2  wheels) Transfers: Sit to/from Stand Sit to Stand: Supervision           General transfer comment: cues for hand placement     Balance Overall balance assessment: Needs assistance Sitting-balance support: No upper extremity supported, Feet supported Sitting balance-Leahy Scale: Fair Sitting balance - Comments: Mostly due to guarding/pain   Standing balance support: No upper extremity supported Standing balance-Leahy Scale: Fair Standing balance comment: Pt able to let go of walker for very short amounts of time to manage clothing, urinal etc.                           ADL either performed or assessed with clinical judgement   ADL Overall ADL's : Needs assistance/impaired Eating/Feeding: Independent;Sitting   Grooming: Wash/dry hands;Wash/dry face;Oral care;Supervision/safety;Standing Grooming Details (indicate cue type and reason): Pt stood at sink for 5 min to groom observing all back precautions.                 Toilet Transfer: Supervision/safety;Ambulation;Comfort height toilet;Grab bars;Rolling walker (2 wheels) Toilet Transfer Details (indicate cue type and reason): Pt walked to bathroom to toilet with supervision. Toileting- Clothing Manipulation and Hygiene: Supervision/safety;Sit to/from stand;Cueing for back precautions Toileting - Clothing Manipulation Details (indicate cue type and reason): reviewed compensatory techniques Tub/ Shower Transfer: Walk-in shower;Supervision/safety;Rolling walker (2 wheels)   Functional mobility during ADLs: Min guard;Rolling walker (2 wheels) General ADL Comments: Pt requires assist for LE dressing and bathing. Pt did well with grooming and toileting this am.    Extremity/Trunk Assessment Upper Extremity Assessment Upper Extremity Assessment: Overall WFL for tasks assessed   Lower Extremity Assessment Lower Extremity Assessment: Defer to PT evaluation  Vision   Vision Assessment?: No apparent visual  deficits   Perception     Praxis      Cognition Arousal/Alertness: Awake/alert Behavior During Therapy: WFL for tasks assessed/performed Overall Cognitive Status: Within Functional Limits for tasks assessed                                          Exercises      Shoulder Instructions       General Comments Pt making slow and steady improvements. Limited mostly by pain.    Pertinent Vitals/ Pain       Pain Assessment Pain Assessment: 0-10 Pain Score: 4  Pain Location: Rt hip and thigh, back surgical site Pain Descriptors / Indicators: Aching, Operative site guarding, Sore Pain Intervention(s): Limited activity within patient's tolerance, Premedicated before session, Repositioned, Monitored during session  Home Living                                          Prior Functioning/Environment              Frequency  Min 2X/week        Progress Toward Goals  OT Goals(current goals can now be found in the care plan section)  Progress towards OT goals: Progressing toward goals  Acute Rehab OT Goals Patient Stated Goal: to get pain managed. OT Goal Formulation: With patient Time For Goal Achievement: 08/19/22 Potential to Achieve Goals: Good ADL Goals Pt Will Perform Grooming: with modified independence;standing Pt Will Perform Lower Body Dressing: with modified independence;sit to/from stand Pt Will Transfer to Toilet: with modified independence;ambulating;regular height toilet  Plan Discharge plan remains appropriate    Co-evaluation                 AM-PAC OT "6 Clicks" Daily Activity     Outcome Measure   Help from another person eating meals?: None Help from another person taking care of personal grooming?: None Help from another person toileting, which includes using toliet, bedpan, or urinal?: A Little Help from another person bathing (including washing, rinsing, drying)?: A Little Help from another person to  put on and taking off regular upper body clothing?: None Help from another person to put on and taking off regular lower body clothing?: A Little 6 Click Score: 21    End of Session Equipment Utilized During Treatment: Gait belt;Rolling walker (2 wheels)  OT Visit Diagnosis: Unsteadiness on feet (R26.81);Muscle weakness (generalized) (M62.81);Pain Pain - Right/Left: Right Pain - part of body: Hip   Activity Tolerance Patient tolerated treatment well   Patient Left in chair;with call bell/phone within reach;with chair alarm set   Nurse Communication Mobility status        Time: [id number] OT Time Calculation (min): 21 min  Charges: OT General Charges $OT Visit: 1 Visit OT Treatments $Self Care/Home Management : 8-22 mins    Glenford Peers 08/11/2022, 11:54 AM

## 2022-08-11 NOTE — Progress Notes (Signed)
   Providing Compassionate, Quality Care - Together  NEUROSURGERY PROGRESS NOTE   S: No issues overnight.  Positive flatus, BM x 1  O: EXAM:  BP 134/70 (BP Location: Left Arm)   Pulse 99   Temp 100.3 F (37.9 C) (Oral)   Resp 18   Ht 5\' 5"  (1.651 m)   Wt 69.9 kg   SpO2 98%   BMI 25.63 kg/m   Awake, alert, oriented x 3 PERRL Speech fluent, appropriate  CNs grossly intact  5/5 BUE/BLE  Incisions clean dry and intact, Hemovac in place Abdomen slightly bloated, nontender   ASSESSMENT:  67 y.o. male with    Degenerative lumbar scoliosis, with sagittal and coronal imbalance   -Status post L2-5 LLIF, L5-S1 ALIF; T10-pelvis instrumentation and fusion   PLAN: -Continue supportive care -PT/OT -Discontinue Hemovac -Continue bowel regimen -Possible DC planning this weekend if pain is further controlled   Thank you for allowing me to participate in this patient's care.  Please do not hesitate to call with questions or concerns.   Elwin Sleight, [facility] Cell: [phone]

## 2022-08-12 NOTE — Progress Notes (Signed)
Physical Therapy Treatment Patient Details Name: John Mcclain MRN: [id number] DOB: 1955/10/22 Today's Date: 08/12/2022   History of Present Illness Pt is a 67 y.o male admitted 3/1 s/p anterior lumbar interbody fusion L5-S1; anterior lateral interbody fusion L2-5 (part 1 of 2). s/p open thoracolumbar instrumentation, fusion of T10-pelvis, L3-S1 decompression, L2-5 posterior column osteotomies (part 2 of 2). PMH significant for anxiety, RA, depression, GERD, heart murmur, HTN, Raynaud's.    PT Comments    Pt greeted semi-reclined in bed and agreeable to session with continued progress towards acute goals. Pt able to complete bed mobility and functional transfers at grossly supervision level, however pt needing light min A to descend to sidelying in flat bed (to simulate home) and bring BLEs into bed. Pt demonstrating gait with at supervision level for increased distance with RW support. Pt continues to be limited by pain, decreased activity tolerance and impaired balance/postural reactions. Pt was educated on continued walker use to maximize functional independence, safety, and decrease risk for falls wit pt verbalizing understanding. Pt continues to benefit from skilled PT services to progress toward functional mobility goals.     Recommendations for follow up therapy are one component of a multi-disciplinary discharge planning process, led by the attending physician.  Recommendations may be updated based on patient status, additional functional criteria and insurance authorization.  Follow Up Recommendations  Home health PT     Assistance Recommended at Discharge Intermittent Supervision/Assistance  Patient can return home with the following A little help with bathing/dressing/bathroom;Assistance with cooking/housework;Help with stairs or ramp for entrance;Assist for transportation;A little help with walking and/or transfers   Equipment Recommendations  None recommended by PT     Recommendations for Other Services       Precautions / Restrictions Precautions Precautions: Back Precaution Comments: reviewed Restrictions Weight Bearing Restrictions: No     Mobility  Bed Mobility Overal bed mobility: Needs Assistance Bed Mobility: Rolling, Sidelying to Sit, Sit to Sidelying Rolling: Supervision Sidelying to sit: Supervision     Sit to sidelying: Min assist General bed mobility comments: light min A to return BLE to bed    Transfers Overall transfer level: Needs assistance Equipment used: Rolling walker (2 wheels) Transfers: Sit to/from Stand Sit to Stand: Supervision           General transfer comment: good hand placement from EOB and bench in hall    Ambulation/Gait Ambulation/Gait assistance: Supervision Gait Distance (Feet): 160 Feet (with x1 seated rest at 80') Assistive device: Rolling walker (2 wheels) Gait Pattern/deviations: Step-through pattern, Antalgic, Trunk flexed Gait velocity: decr     General Gait Details: Progressing well, gradually improving posture with increased distance. Still rigid and guarded. Tolerated increased distance with seated rest halway   Stairs             Wheelchair Mobility    Modified Rankin (Stroke Patients Only)       Balance Overall balance assessment: Needs assistance Sitting-balance support: No upper extremity supported, Feet supported Sitting balance-Leahy Scale: Fair Sitting balance - Comments: Mostly due to guarding/pain   Standing balance support: No upper extremity supported Standing balance-Leahy Scale: Fair Standing balance comment: Pt able to let go of walker for very short amounts of time to manage clothing, urinal etc.                            Cognition Arousal/Alertness: Awake/alert Behavior During Therapy: WFL for tasks assessed/performed Overall Cognitive Status: Within Functional  Limits for tasks assessed                                           Exercises      General Comments General comments (skin integrity, edema, etc.): pt contineus to be most limited by pain      Pertinent Vitals/Pain Pain Assessment Pain Assessment: Faces Faces Pain Scale: Hurts little more Pain Location: Rt hip and thigh, back surgical site Pain Descriptors / Indicators: Aching, Operative site guarding, Sore, Burning, Tingling Pain Intervention(s): Monitored during session, Limited activity within patient's tolerance, Repositioned    Home Living                          Prior Function            PT Goals (current goals can now be found in the care plan section) Acute Rehab PT Goals PT Goal Formulation: With patient Time For Goal Achievement: 08/19/22 Progress towards PT goals: Progressing toward goals    Frequency    Min 4X/week      PT Plan Current plan remains appropriate    Co-evaluation              AM-PAC PT "6 Clicks" Mobility   Outcome Measure  Help needed turning from your back to your side while in a flat bed without using bedrails?: A Little Help needed moving from lying on your back to sitting on the side of a flat bed without using bedrails?: A Little Help needed moving to and from a bed to a chair (including a wheelchair)?: A Little Help needed standing up from a chair using your arms (e.g., wheelchair or bedside chair)?: A Little Help needed to walk in hospital room?: A Little Help needed climbing 3-5 steps with a railing? : A Little 6 Click Score: 18    End of Session   Activity Tolerance: Patient tolerated treatment well Patient left: in bed;with call bell/phone within reach (with bed in chair position as pt stating reclienr too uncomfortable to sit in) Nurse Communication: Mobility status;Other (comment) (bottom of dressign peeling up, RN arriving and reinforcing dressing) PT Visit Diagnosis: Unsteadiness on feet (R26.81);Other abnormalities of gait and mobility (R26.89);Muscle  weakness (generalized) (M62.81);Difficulty in walking, not elsewhere classified (R26.2);Pain Pain - Right/Left: Right Pain - part of body: Hip     Time: [id number] PT Time Calculation (min) (ACUTE ONLY): 29 min  Charges:  $Gait Training: 8-22 mins $Therapeutic Activity: 8-22 mins                     Kellyanne Ellwanger R. PTA Acute Rehabilitation Services Office: [place] 08/12/2022, 10:46 AM

## 2022-08-12 NOTE — Progress Notes (Signed)
Neurosurgery Service Progress Note  Subjective: No acute events overnight, some flatus, +abdominal pain, no BM, no leg pain, back pain slowly improving   Objective: Vitals:   08/11/22 2038 08/12/22 0127 08/12/22 0355 08/12/22 0754  BP: 120/64  122/60 (!) 117/55  Pulse: 94  91 93  Resp: '18  16 17  '$ Temp: (!) 100.4 F (38 C) 98.1 F (36.7 C) 99.2 F (37.3 C) 99.6 F (37.6 C)  TempSrc: Oral Oral Oral Oral  SpO2: 98%  94% 99%  Weight:      Height:        Physical Exam: Strength 5/5 x4 except expected L HF weakness, SILTx4, incisions c/d/i  Assessment & Plan: 67 y.o. man s/p ALIF / XLIF / T10-pelvis, recovering well.  -will see how he does today, can discharge home later today if he feels up to it, otherwise will keep another night -HHPTOT at discharge  Judith Part  08/12/22 10:16 AM

## 2022-08-12 NOTE — Plan of Care (Signed)
  Problem: Activity: Goal: Risk for activity intolerance will decrease Outcome: Progressing   Problem: Nutrition: Goal: Adequate nutrition will be maintained Outcome: Progressing   Problem: Coping: Goal: Level of anxiety will decrease Outcome: Progressing   

## 2022-08-13 ENCOUNTER — Other Ambulatory Visit: Payer: Self-pay | Admitting: Emergency Medicine

## 2022-08-13 DIAGNOSIS — Z299 Encounter for prophylactic measures, unspecified: Secondary | ICD-10-CM

## 2022-08-13 NOTE — Progress Notes (Signed)
Mobility Specialist Progress Note    08/13/22 1046  Mobility  Activity Ambulated with assistance in hallway  Level of Assistance Contact guard assist, steadying assist  Assistive Device Front wheel walker  Distance Ambulated (ft) 180 ft (90+90)  Activity Response Tolerated well  Mobility Referral Yes  $Mobility charge 1 Mobility   Pt received in bed and agreeable. C/o 7/10 itching, burning and aching back pain. Took x1 extended seated rest break. Returned to bed with call bell in reach.   Hildred Alamin Mobility Specialist  Please Psychologist, sport and exercise or Rehab Office at [phone]

## 2022-08-13 NOTE — Plan of Care (Signed)
  Problem: Elimination: Goal: Will not experience complications related to bowel motility Outcome: Progressing   Problem: Pain Managment: Goal: General experience of comfort will improve Outcome: Progressing   Problem: Bowel/Gastric: Goal: Gastrointestinal status for postoperative course will improve Outcome: Progressing

## 2022-08-13 NOTE — Progress Notes (Signed)
Neurosurgery Service Progress Note  Subjective: No acute events overnight. No leg pain. Back pain as expected, slowly improving. States that he still feels very weak overall.    Objective: Vitals:   08/12/22 1546 08/12/22 2302 08/13/22 0556 08/13/22 0826  BP: (!) 99/57 (!) 101/57 (!) 122/55 116/64  Pulse: 83 80 86 93  Resp: '16 16 18 18  '$ Temp: 99.4 F (37.4 C) 99 F (37.2 C) 99.3 F (37.4 C) 98.1 F (36.7 C)  TempSrc: Oral  Oral Oral  SpO2: 100% 97% 98% 100%  Weight:      Height:        Physical Exam: Strength 5/5 x4 except expected L HF weakness, SILTx4, incisions c/d/i   Assessment & Plan: 68 y.o. man s/p ALIF / XLIF / T10-pelvis, recovering well.   -will keep another night, consider d/c home tomorrow  -HHPTOT at discharge  Norm Parcel, PA-C 08/13/22 8:43 AM

## 2022-08-14 ENCOUNTER — Encounter (HOSPITAL_COMMUNITY): Payer: Self-pay | Admitting: Neurological Surgery

## 2022-08-14 LAB — BASIC METABOLIC PANEL
Anion gap: 8 (ref 5–15)
BUN: 7 mg/dL — ABNORMAL LOW (ref 8–23)
CO2: 24 mmol/L (ref 22–32)
Calcium: 8 mg/dL — ABNORMAL LOW (ref 8.9–10.3)
Chloride: 102 mmol/L (ref 98–111)
Creatinine, Ser: 0.98 mg/dL (ref 0.61–1.24)
GFR, Estimated: >60 mL/min (ref >60)
Glucose, Bld: 126 mg/dL — ABNORMAL HIGH (ref 70–99)
Potassium: 3.8 mmol/L (ref 3.5–5.1)
Sodium: 134 mmol/L — ABNORMAL LOW (ref 135–145)

## 2022-08-14 LAB — CBC
HCT: 21.7 % — ABNORMAL LOW (ref 39.0–52.0)
Hemoglobin: 7.2 g/dL — ABNORMAL LOW (ref 13.0–17.0)
MCH: 30.6 pg (ref 26.0–34.0)
MCHC: 33.2 g/dL (ref 30.0–36.0)
MCV: 92.3 fL (ref 80.0–100.0)
Platelets: 359 10*3/uL (ref 150–400)
RBC: 2.35 MIL/uL — ABNORMAL LOW (ref 4.22–5.81)
RDW: 15.2 % (ref 11.5–15.5)
WBC: 14 10*3/uL — ABNORMAL HIGH (ref 4.0–10.5)
nRBC: 0.2 % (ref 0.0–0.2)

## 2022-08-14 LAB — PREPARE RBC (CROSSMATCH)

## 2022-08-14 MED ORDER — SODIUM CHLORIDE 0.9 % IV BOLUS
500.0000 mL | Freq: Once | INTRAVENOUS | Status: AC
  Administered 2022-08-14: 500 mL via INTRAVENOUS

## 2022-08-14 MED ORDER — OXYCODONE-ACETAMINOPHEN 5-325 MG PO TABS: 1.0000 | ORAL_TABLET | ORAL | 0 refills | Status: AC | PRN

## 2022-08-14 MED ORDER — SODIUM CHLORIDE 0.9% IV SOLUTION: Freq: Once | INTRAVENOUS | Status: DC

## 2022-08-14 MED ORDER — NAPHAZOLINE-PHENIRAMINE 0.025-0.3 % OP SOLN
1.0000 [drp] | Freq: Four times a day (QID) | OPHTHALMIC | Status: DC | PRN
  Administered 2022-08-15: 1 [drp] via OPHTHALMIC
  Filled 2022-08-14: qty 15

## 2022-08-14 NOTE — Plan of Care (Signed)
  Problem: Activity: Goal: Risk for activity intolerance will decrease Outcome: Progressing   Problem: Coping: Goal: Level of anxiety will decrease Outcome: Progressing   Problem: Elimination: Goal: Will not experience complications related to bowel motility Outcome: Progressing   Problem: Pain Managment: Goal: General experience of comfort will improve Outcome: Progressing   

## 2022-08-14 NOTE — Progress Notes (Signed)
Physical Therapy Treatment Patient Details Name: John Mcclain MRN: [id number] DOB: 08/06/1955 Today's Date: 08/14/2022   History of Present Illness Pt is a 67 y.o male admitted 3/1 s/p anterior lumbar interbody fusion L5-S1; anterior lateral interbody fusion L2-5 (part 1 of 2). s/p open thoracolumbar instrumentation, fusion of T10-pelvis, L3-S1 decompression, L2-5 posterior column osteotomies (part 2 of 2). PMH significant for anxiety, RA, depression, GERD, heart murmur, HTN, Raynaud's.    PT Comments    Further progression towards functional mobility goals. Completed stair training today with min assist to block RW only, using posterior approach, handout provided. Pt did become dizzy and diaphoretic on return bout from hallway and was wheeled back to room. BP was 96/48 HR 71 while sitting. Upon returning to supine BP 114/59 HR 79 with notable improvement in symptoms. RN notified. Patient will continue to benefit from skilled physical therapy services to further improve independence with functional mobility.    Recommendations for follow up therapy are one component of a multi-disciplinary discharge planning process, led by the attending physician.  Recommendations may be updated based on patient status, additional functional criteria and insurance authorization.  Follow Up Recommendations  Home health PT     Assistance Recommended at Discharge Intermittent Supervision/Assistance  Patient can return home with the following A little help with bathing/dressing/bathroom;Assistance with cooking/housework;Help with stairs or ramp for entrance;Assist for transportation;A little help with walking and/or transfers   Equipment Recommendations  None recommended by PT    Recommendations for Other Services       Precautions / Restrictions Precautions Precautions: Back Precaution Booklet Issued: Yes (comment) Precaution Comments: reviewed Restrictions Weight Bearing Restrictions: No      Mobility  Bed Mobility Overal bed mobility: Needs Assistance Bed Mobility: Rolling, Sidelying to Sit, Sit to Sidelying Rolling: Supervision Sidelying to sit: Supervision     Sit to sidelying: Supervision General bed mobility comments: Supervision for bed mobility, able to rise to EOB and lower back into bed, maintaining back precautions.    Transfers Overall transfer level: Needs assistance Equipment used: Rolling walker (2 wheels) Transfers: Sit to/from Stand Sit to Stand: Supervision           General transfer comment: Supervision for safety, performed from various surfaces today with cues for technique, safely maintains back precautions during transitions. Slow to rise from lower surfaces.    Ambulation/Gait Ambulation/Gait assistance: Supervision Gait Distance (Feet): 90 Feet (+12 +25 +30) Assistive device: Rolling walker (2 wheels) Gait Pattern/deviations: Step-through pattern, Trunk flexed Gait velocity: decr Gait velocity interpretation: <1.31 ft/sec, indicative of household ambulator   General Gait Details: Slow and guarded but without overt instability or buckling. Amb to bathroom, through hallway and into stairwell. Amb part of the way back but became dizzy and diaphoretic and requested to sit, pt was rolled back to room.   Stairs Stairs: Yes Stairs assistance: Min assist Stair Management: No rails, Step to pattern, Backwards, With walker Number of Stairs: 2 General stair comments: Educated on approaches to stairs similar to home set-up. Cues for technique. Min assist for RW block only while performing posterior approach. Good clearance and control.   Wheelchair Mobility    Modified Rankin (Stroke Patients Only)       Balance Overall balance assessment: Needs assistance Sitting-balance support: No upper extremity supported, Feet supported Sitting balance-Leahy Scale: Fair Sitting balance - Comments: Mostly due to guarding/pain   Standing balance  support: No upper extremity supported Standing balance-Leahy Scale: Fair Standing balance comment: Pt able to let  go of walker for short amounts of time to urinate at toilet.                            Cognition Arousal/Alertness: Awake/alert Behavior During Therapy: WFL for tasks assessed/performed Overall Cognitive Status: Within Functional Limits for tasks assessed                                          Exercises      General Comments General comments (skin integrity, edema, etc.): Seated BP after ambulating, symptomatic with dizziness and diaphoretic 96/48 HR 71 (RN notified.) After returning to supine 114/59 HR 79.      Pertinent Vitals/Pain Pain Assessment Pain Assessment: Faces Faces Pain Scale: Hurts even more Pain Location: Rt hip and thigh, back surgical site Pain Descriptors / Indicators: Aching, Operative site guarding, Sore, Burning, Tingling    Home Living                          Prior Function            PT Goals (current goals can now be found in the care plan section) Acute Rehab PT Goals Patient Stated Goal: Get well PT Goal Formulation: With patient Time For Goal Achievement: 08/19/22 Potential to Achieve Goals: Good Progress towards PT goals: Progressing toward goals    Frequency    Min 4X/week      PT Plan Current plan remains appropriate    Co-evaluation              AM-PAC PT "6 Clicks" Mobility   Outcome Measure  Help needed turning from your back to your side while in a flat bed without using bedrails?: A Little Help needed moving from lying on your back to sitting on the side of a flat bed without using bedrails?: A Little Help needed moving to and from a bed to a chair (including a wheelchair)?: A Little Help needed standing up from a chair using your arms (e.g., wheelchair or bedside chair)?: A Little Help needed to walk in hospital room?: A Little Help needed climbing 3-5 steps  with a railing? : A Little 6 Click Score: 18    End of Session Equipment Utilized During Treatment: Gait belt Activity Tolerance: Patient tolerated treatment well Patient left: in bed;with call bell/phone within reach Nurse Communication: Mobility status;Other (comment) (hypotension) PT Visit Diagnosis: Unsteadiness on feet (R26.81);Other abnormalities of gait and mobility (R26.89);Muscle weakness (generalized) (M62.81);Difficulty in walking, not elsewhere classified (R26.2);Pain Pain - Right/Left: Right Pain - part of body: Hip     Time: 0922-1001 PT Time Calculation (min) (ACUTE ONLY): 39 min  Charges:  $Gait Training: 8-22 mins $Therapeutic Activity: 23-37 mins                     Candie Mile, PT, DPT Physical Therapist Acute Rehabilitation Services [place]    Ellouise Newer 08/14/2022, 10:38 AM

## 2022-08-14 NOTE — Discharge Summary (Cosign Needed)
Discharge Summary   Date of Admission: 08/04/2022   Date of Discharge: 08/14/22   Attending Physician: Elwin Sleight, MD   Hospital Course: Patient was admitted following an uncomplicated [id number] ALIF, [id number] XLIF, followed by a PSF T10-pelvis. They were recovered in PACU and transferred to 5N. Their preop symptoms were improved, their hospital course was uncomplicated, and the patient was discharged home today. They will follow up in clinic with me in 2 weeks.   Neurologic exam at discharge:  Strength 5/5 x4 and SILTx4, incision c/d/i   Discharge diagnosis: Lumbar stenosis with radiculopathy

## 2022-08-14 NOTE — Progress Notes (Signed)
Occupational Therapy Treatment Patient Details Name: John Mcclain MRN: [id number] DOB: September 05, 1955 Today's Date: 08/14/2022   History of present illness Pt is a 67 y.o male admitted 3/1 s/p anterior lumbar interbody fusion L5-S1; anterior lateral interbody fusion L2-5 (part 1 of 2). s/p open thoracolumbar instrumentation, fusion of T10-pelvis, L3-S1 decompression, L2-5 posterior column osteotomies (part 2 of 2). PMH significant for anxiety, RA, depression, GERD, heart murmur, HTN, Raynaud's.   OT comments  Pt progressing nicely toward his OT goals and performing ADL transfers at (S) level using the RW, and able to practice 10 ft of functional mobility without a device with (S) as well. He reports pain is currently well managed. Hgb at 7.2 so pt awaiting transfusion and hopeful to feel better following. Pt would benefit from continued acute OT services to facilitate safe d/c home and optimize occupational performance. Recommend HHOT at d/c.    Recommendations for follow up therapy are one component of a multi-disciplinary discharge planning process, led by the attending physician.  Recommendations may be updated based on patient status, additional functional criteria and insurance authorization.    Follow Up Recommendations  Home health OT     Assistance Recommended at Discharge Intermittent Supervision/Assistance  Patient can return home with the following  A little help with bathing/dressing/bathroom;Assistance with cooking/housework;Assist for transportation;A little help with walking and/or transfers   Equipment Recommendations  None recommended by OT    Recommendations for Other Services      Precautions / Restrictions Precautions Precautions: Back Precaution Booklet Issued: Yes (comment) Precaution Comments: reviewed Restrictions Weight Bearing Restrictions: No       Mobility Bed Mobility Overal bed mobility: Needs Assistance Bed Mobility: Rolling, Sidelying to Sit, Sit to  Sidelying Rolling: Supervision Sidelying to sit: Supervision     Sit to sidelying: Supervision General bed mobility comments: Supervision for bed mobility, use of bed rail, min cueing for back precautions    Transfers Overall transfer level: Needs assistance Equipment used: Rolling walker (2 wheels) Transfers: Sit to/from Stand, Bed to chair/wheelchair/BSC Sit to Stand: Supervision Stand pivot transfers: Supervision         General transfer comment: Supervision for ADL transfers overall with the RW. Completed 10 ft without the RW and he required CGA fading to supervision     Balance Overall balance assessment: Needs assistance Sitting-balance support: No upper extremity supported, Feet supported Sitting balance-Leahy Scale: Normal     Standing balance support: No upper extremity supported Standing balance-Leahy Scale: Fair Standing balance comment: Pt able to complete 10 ft of functional mobility without AD with (S)                           ADL either performed or assessed with clinical judgement   ADL                                              Extremity/Trunk Assessment              Vision       Perception     Praxis      Cognition Arousal/Alertness: Awake/alert Behavior During Therapy: WFL for tasks assessed/performed Overall Cognitive Status: Within Functional Limits for tasks assessed  Exercises      Shoulder Instructions       General Comments Pt reporting he feels better from this morning, awaiting two units of blood    Pertinent Vitals/ Pain       Pain Assessment Pain Assessment: 0-10 Faces Pain Scale: Hurts a little bit Pain Location: Spinal surgical site Pain Descriptors / Indicators: Aching Pain Intervention(s): Monitored during session  Home Living                                          Prior Functioning/Environment               Frequency  Min 2X/week        Progress Toward Goals  OT Goals(current goals can now be found in the care plan section)  Progress towards OT goals: Progressing toward goals  Acute Rehab OT Goals Patient Stated Goal: Get blood transfusion OT Goal Formulation: With patient Time For Goal Achievement: 08/19/22 Potential to Achieve Goals: Good  Plan Discharge plan remains appropriate    Co-evaluation                 AM-PAC OT "6 Clicks" Daily Activity     Outcome Measure   Help from another person eating meals?: None Help from another person taking care of personal grooming?: None Help from another person toileting, which includes using toliet, bedpan, or urinal?: A Little Help from another person bathing (including washing, rinsing, drying)?: A Little Help from another person to put on and taking off regular upper body clothing?: None Help from another person to put on and taking off regular lower body clothing?: A Little 6 Click Score: 21    End of Session Equipment Utilized During Treatment: Gait belt;Rolling walker (2 wheels)  OT Visit Diagnosis: Unsteadiness on feet (R26.81);Muscle weakness (generalized) (M62.81);Pain   Activity Tolerance Patient tolerated treatment well   Patient Left in bed;with call bell/phone within reach;with bed alarm set   Nurse Communication Mobility status        Time: [id number] OT Time Calculation (min): 20 min  Charges: OT General Charges $OT Visit: 1 Visit OT Treatments $Therapeutic Activity: 8-22 mins  Laverle Hobby, OTR/L, CBIS Acute Rehab Office: [place] 08/14/2022, 1:46 PM

## 2022-08-14 NOTE — Care Management Important Message (Signed)
Important Message  Patient Details  Name: John Mcclain MRN: [id number] Date of Birth: 1955/12/27   Medicare Important Message Given:  Yes     Hannah Beat 08/14/2022, 11:57 AM

## 2022-08-14 NOTE — Progress Notes (Signed)
   Providing Compassionate, Quality Care - Together  NEUROSURGERY PROGRESS NOTE   S: No issues overnight. This am had hypotension with standing  O: EXAM:  BP 122/62 (BP Location: Left Arm)   Pulse 85   Temp 99.5 F (37.5 C) (Oral)   Resp 15   Ht 5\' 5"  (1.651 m)   Wt 69.9 kg   SpO2 99%   BMI 25.63 kg/m   Awake, alert, oriented x3 PERRL Speech fluent, appropriate  CNs grossly intact  Abd soft Incisions c/d/i 5/5 BUE/BLE   ASSESSMENT:  67 y.o. male with   Degenerative lumbar scoliosis, with sagittal and coronal imbalance  -Status post L2-5 LLIF, L5-S1 ALIF; T10-pelvis instrumentation and fusion   PLAN: -Continue supportive care -will check labs, may need pRBCs -doing well, family not quite ready for him to come home w pt, considering SNF    Thank you for allowing me to participate in this patient's care.  Please do not hesitate to call with questions or concerns.   Elwin Sleight, [facility] Cell: [phone]

## 2022-08-15 ENCOUNTER — Inpatient Hospital Stay (HOSPITAL_COMMUNITY): Payer: Medicare PPO

## 2022-08-15 ENCOUNTER — Other Ambulatory Visit: Payer: Self-pay | Admitting: Emergency Medicine

## 2022-08-15 DIAGNOSIS — I10 Essential (primary) hypertension: Secondary | ICD-10-CM

## 2022-08-15 DIAGNOSIS — R2242 Localized swelling, mass and lump, left lower limb: Secondary | ICD-10-CM | POA: Diagnosis not present

## 2022-08-15 LAB — TYPE AND SCREEN
ABO/RH(D): O POS
Antibody Screen: NEGATIVE
Unit division: 0
Unit division: 0

## 2022-08-15 LAB — BPAM RBC
Blood Product Expiration Date: 202403142359
Blood Product Expiration Date: 202404082359
ISSUE DATE / TIME: 202403111609
ISSUE DATE / TIME: 202403111936
Unit Type and Rh: 5100
Unit Type and Rh: 5100

## 2022-08-15 LAB — CBC
HCT: 30 % — ABNORMAL LOW (ref 39.0–52.0)
Hemoglobin: 9.9 g/dL — ABNORMAL LOW (ref 13.0–17.0)
MCH: 29.9 pg (ref 26.0–34.0)
MCHC: 33 g/dL (ref 30.0–36.0)
MCV: 90.6 fL (ref 80.0–100.0)
Platelets: 319 10*3/uL (ref 150–400)
RBC: 3.31 MIL/uL — ABNORMAL LOW (ref 4.22–5.81)
RDW: 16.1 % — ABNORMAL HIGH (ref 11.5–15.5)
WBC: 15.4 10*3/uL — ABNORMAL HIGH (ref 4.0–10.5)
nRBC: 0.3 % — ABNORMAL HIGH (ref 0.0–0.2)

## 2022-08-15 NOTE — Plan of Care (Signed)
  Problem: Education: Goal: Knowledge of General Education information will improve Description: Including pain rating scale, medication(s)/side effects and non-pharmacologic comfort measures Outcome: Progressing   Problem: Clinical Measurements: Goal: Will remain free from infection Outcome: Progressing   

## 2022-08-15 NOTE — Plan of Care (Signed)

## 2022-08-15 NOTE — Progress Notes (Signed)
On assessment, left leg with non pitting edema with some spots that patient felt was tender. Patient had a BM with some bleeding.  Patient was also complaining of dryness of eyes.  Night on call MD Glenford Peers notified who advised to continue Heparin and verbally ordered for venous doppler of left leg and PRN eyedrop visin.

## 2022-08-15 NOTE — TOC Progression Note (Signed)
Transition of Care [facility]) - Progression Note    Patient Details  Name: John Mcclain MRN: [id number] Date of Birth: November 11, 1955  Transition of Care [facility]) CM/SW Contact  Carles Collet, RN Phone Number: 08/15/2022, 3:29 PM  Clinical Narrative:     Confirmed with patient, at bedside, that he is felling better after transfusion yesterday. He states his plans are to go home tomorrow at discharge. Discussed that [facility] will follow for home health. Patient has RW at home, no other DME recommended from therapy evals.   Expected Discharge Plan: [place] Barriers to Discharge: Continued Medical Work up  Expected Discharge Plan and Services   Discharge Planning Services: CM Consult   Living arrangements for the past 2 months: Single Family Home Expected Discharge Date: 08/14/22               DME Arranged:  (already has walker)         HH Arranged: PT, OT HH Agency: [place] Date [facility] Agency Contacted: 08/10/22 Time [place]: [place] Representative spoke with at [place]: [place] Determinants of Health ([place]) Interventions SDOH Screenings   Food Insecurity: No Food Insecurity (08/04/2022)  Housing: [place]  (08/04/2022)  Transportation Needs: No Transportation Needs (08/04/2022)  Utilities: Not At Risk (08/04/2022)  Depression (PHQ2-9): Low Risk  (05/24/2022)  Tobacco Use: Low Risk  (08/14/2022)    Readmission Risk Interventions     No data to display

## 2022-08-15 NOTE — Progress Notes (Signed)
Lower extremity venous left study completed.   Please see CV Proc for preliminary results.   Shanica Castellanos, RDMS, RVT  

## 2022-08-15 NOTE — Plan of Care (Signed)
  Problem: Education: Goal: Knowledge of General Education information will improve Description: Including pain rating scale, medication(s)/side effects and non-pharmacologic comfort measures 08/15/2022 1844 by Rodell Perna, RN Outcome: Adequate for Discharge 08/15/2022 1050 by Rodell Perna, RN Outcome: Progressing   Problem: Health Behavior/Discharge Planning: Goal: Ability to manage health-related needs will improve Outcome: Adequate for Discharge   Problem: Clinical Measurements: Goal: Will remain free from infection 08/15/2022 1844 by Rodell Perna, RN Outcome: Adequate for Discharge 08/15/2022 1050 by Rodell Perna, RN Outcome: Progressing   Problem: Nutrition: Goal: Adequate nutrition will be maintained Outcome: Adequate for Discharge   Problem: Coping: Goal: Level of anxiety will decrease Outcome: Adequate for Discharge   Problem: Elimination: Goal: Will not experience complications related to bowel motility Outcome: Adequate for Discharge   Problem: Pain Managment: Goal: General experience of comfort will improve Outcome: Adequate for Discharge

## 2022-08-15 NOTE — Progress Notes (Signed)
S: No issues overnight. Pt reports feeling improved after transfusion.     O: EXAM:  BP 132/65 (BP Location: Left Arm)   Pulse 84   Temp 98 F (36.7 C) (Oral)   Resp 15   Ht '5\' 5"'$  (1.651 m)   Wt 69.9 kg   SpO2 99%   BMI 25.63 kg/m     Awake, alert, oriented x3 PERRL Speech fluent, appropriate  CNs grossly intact  Abd soft Incisions c/d/i 5/5 BUE/BLE    ASSESSMENT:  67 y.o. male with    Degenerative lumbar scoliosis, with sagittal and coronal imbalance   -Status post L2-5 LLIF, L5-S1 ALIF; T10-pelvis instrumentation and fusion   PLAN: -Continue supportive care -Hemoglobin improved to 9.9 s/p transfusion of 2u PRBCs.  -Leukocytosis likely reactive. CXR negative, patient afebrile.  -Progressing appropriately, family hesitant to bring pt home. Considering SNF pending progress w/ PT.   Caroline More [facility]

## 2022-08-15 NOTE — Progress Notes (Signed)
Physical Therapy Treatment Patient Details Name: John Mcclain MRN: [id number] DOB: 11/24/1955 Today's Date: 08/15/2022   History of Present Illness Pt is a 67 y.o male admitted 3/1 s/p anterior lumbar interbody fusion L5-S1; anterior lateral interbody fusion L2-5 (part 1 of 2). s/p open thoracolumbar instrumentation, fusion of T10-pelvis, L3-S1 decompression, L2-5 posterior column osteotomies (part 2 of 2). PMH significant for anxiety, RA, depression, GERD, heart murmur, HTN, Raynaud's.    PT Comments    Good progress today, completed two ambulatory bouts of 115 feet each at a supervision level using a walker for support. No complaints of dizziness today. Improving independence with bed mobility and transfers. Feeling more encouraged. Patient will continue to benefit from skilled physical therapy services to further improve independence with functional mobility.     08/15/22 1023  Orthostatic Lying   BP- Lying 132/67  Pulse- Lying 86  Orthostatic Sitting  BP- Sitting 133/81  Pulse- Sitting 90  Orthostatic Standing at 0 minutes  BP- Standing at 0 minutes (!) 141/112  Pulse- Standing at 0 minutes 98  Orthostatic Standing at 3 minutes  BP- Standing at 3 minutes 139/71  Pulse- Standing at 3 minutes 103     Recommendations for follow up therapy are one component of a multi-disciplinary discharge planning process, led by the attending physician.  Recommendations may be updated based on patient status, additional functional criteria and insurance authorization.  Follow Up Recommendations  Home health PT     Assistance Recommended at Discharge Intermittent Supervision/Assistance  Patient can return home with the following A little help with bathing/dressing/bathroom;Assistance with cooking/housework;Help with stairs or ramp for entrance;Assist for transportation;A little help with walking and/or transfers   Equipment Recommendations  None recommended by PT    Recommendations for Other  Services       Precautions / Restrictions Precautions Precautions: Back Precaution Booklet Issued: Yes (comment) Precaution Comments: reviewed Restrictions Weight Bearing Restrictions: No     Mobility  Bed Mobility Overal bed mobility: Needs Assistance Bed Mobility: Rolling, Sidelying to Sit Rolling: Supervision Sidelying to sit: Supervision     Sit to sidelying: Supervision General bed mobility comments: Supervision for safety, nearly mod I, using rail lightly to roll and press up.    Transfers Overall transfer level: Needs assistance Equipment used: Rolling walker (2 wheels) Transfers: Sit to/from Stand Sit to Stand: Supervision           General transfer comment: Supervision for safety, good power up today, no assist needed.    Ambulation/Gait Ambulation/Gait assistance: Supervision Gait Distance (Feet): 115 Feet (x2) Assistive device: Rolling walker (2 wheels) Gait Pattern/deviations: Step-through pattern, Trunk flexed Gait velocity: decr Gait velocity interpretation: <1.8 ft/sec, indicate of risk for recurrent falls   General Gait Details: VC for posture with improved correction today, light use of RW, slightly improved gait speed. Less guarding. Pt declines dizziness, moslty pain in back. No buckling or overt LOB noted. Completed 2 bouts with seated rest period.   Stairs         General stair comments: Declined   Wheelchair Mobility    Modified Rankin (Stroke Patients Only)       Balance Overall balance assessment: Needs assistance Sitting-balance support: No upper extremity supported, Feet supported Sitting balance-Leahy Scale: Good Sitting balance - Comments: Mostly due to guarding/pain   Standing balance support: No upper extremity supported Standing balance-Leahy Scale: Fair  Cognition Arousal/Alertness: Awake/alert Behavior During Therapy: WFL for tasks assessed/performed Overall Cognitive  Status: Within Functional Limits for tasks assessed                                          Exercises      General Comments General comments (skin integrity, edema, etc.): Orthostatics in vitals tab: asymptomatic throughout session      Pertinent Vitals/Pain Pain Assessment Pain Assessment: 0-10 Pain Score: 4  Pain Location: Spinal surgical site Pain Descriptors / Indicators: Aching Pain Intervention(s): Monitored during session, Repositioned, Limited activity within patient's tolerance    Home Living                          Prior Function            PT Goals (current goals can now be found in the care plan section) Acute Rehab PT Goals Patient Stated Goal: Get well PT Goal Formulation: With patient Time For Goal Achievement: 08/19/22 Potential to Achieve Goals: Good Progress towards PT goals: Progressing toward goals    Frequency    Min 4X/week      PT Plan Current plan remains appropriate    Co-evaluation              AM-PAC PT "6 Clicks" Mobility   Outcome Measure  Help needed turning from your back to your side while in a flat bed without using bedrails?: A Little Help needed moving from lying on your back to sitting on the side of a flat bed without using bedrails?: A Little Help needed moving to and from a bed to a chair (including a wheelchair)?: A Little Help needed standing up from a chair using your arms (e.g., wheelchair or bedside chair)?: A Little Help needed to walk in hospital room?: A Little Help needed climbing 3-5 steps with a railing? : A Little 6 Click Score: 18    End of Session Equipment Utilized During Treatment: Gait belt Activity Tolerance: Patient tolerated treatment well Patient left: with call bell/phone within reach;with SCD's reapplied;in bed   PT Visit Diagnosis: Unsteadiness on feet (R26.81);Other abnormalities of gait and mobility (R26.89);Muscle weakness (generalized) (M62.81);Difficulty  in walking, not elsewhere classified (R26.2);Pain Pain - Right/Left: Right Pain - part of body: Hip     Time: [id number] PT Time Calculation (min) (ACUTE ONLY): 43 min  Charges:  $Gait Training: 23-37 mins $Therapeutic Activity: 8-22 mins                     Candie Mile, PT, DPT Physical Therapist Acute Rehabilitation Services [place]    Ellouise Newer 08/15/2022, 12:26 PM

## 2022-08-15 NOTE — Progress Notes (Signed)
Mobility Specialist Progress Note   08/15/22 1218  Mobility  Activity Ambulated with assistance in hallway;Dangled on edge of bed  Level of Assistance Contact guard assist, steadying assist  Assistive Device Front wheel walker  Distance Ambulated (ft) 150 ft  Range of Motion/Exercises Active;All extremities  Activity Response Tolerated well   Patient received in supine and agreeable to participate. Ambulated min guard with slow steady gait. Returned to room without complaint or incident. Was left dangling EOB with all needs met, call bell in reach.   John Mcclain, BS EXP Mobility Specialist Please contact via SecureChat or Rehab office at [phone]

## 2022-08-16 ENCOUNTER — Inpatient Hospital Stay (HOSPITAL_COMMUNITY): Payer: Medicare PPO

## 2022-08-16 DIAGNOSIS — M79605 Pain in left leg: Secondary | ICD-10-CM

## 2022-08-16 DIAGNOSIS — M7989 Other specified soft tissue disorders: Secondary | ICD-10-CM | POA: Diagnosis not present

## 2022-08-16 LAB — CBC
HCT: 31.3 % — ABNORMAL LOW (ref 39.0–52.0)
Hemoglobin: 10.5 g/dL — ABNORMAL LOW (ref 13.0–17.0)
MCH: 30.3 pg (ref 26.0–34.0)
MCHC: 33.5 g/dL (ref 30.0–36.0)
MCV: 90.2 fL (ref 80.0–100.0)
Platelets: 239 10*3/uL (ref 150–400)
RBC: 3.47 MIL/uL — ABNORMAL LOW (ref 4.22–5.81)
RDW: 15.9 % — ABNORMAL HIGH (ref 11.5–15.5)
WBC: 18.7 10*3/uL — ABNORMAL HIGH (ref 4.0–10.5)
nRBC: 0.2 % (ref 0.0–0.2)

## 2022-08-16 LAB — BASIC METABOLIC PANEL
Anion gap: 9 (ref 5–15)
BUN: 6 mg/dL — ABNORMAL LOW (ref 8–23)
CO2: 23 mmol/L (ref 22–32)
Calcium: 8.2 mg/dL — ABNORMAL LOW (ref 8.9–10.3)
Chloride: 100 mmol/L (ref 98–111)
Creatinine, Ser: 0.94 mg/dL (ref 0.61–1.24)
GFR, Estimated: >60 mL/min (ref >60)
Glucose, Bld: 136 mg/dL — ABNORMAL HIGH (ref 70–99)
Potassium: 4.4 mmol/L (ref 3.5–5.1)
Sodium: 132 mmol/L — ABNORMAL LOW (ref 135–145)

## 2022-08-16 LAB — HEPARIN LEVEL (UNFRACTIONATED): Heparin Unfractionated: 0.66 IU/mL (ref 0.30–0.70)

## 2022-08-16 MED ORDER — HEPARIN (PORCINE) 25000 UT/250ML-% IV SOLN
1200.0000 [IU]/h | INTRAVENOUS | Status: DC
  Filled 2022-08-16: qty 250

## 2022-08-16 MED ORDER — HEPARIN BOLUS VIA INFUSION
4200.0000 [IU] | Freq: Once | INTRAVENOUS | Status: AC
  Administered 2022-08-16: 4200 [IU] via INTRAVENOUS
  Filled 2022-08-16: qty 4200

## 2022-08-16 MED ORDER — IOHEXOL 350 MG/ML SOLN
75.0000 mL | Freq: Once | INTRAVENOUS | Status: AC | PRN
  Administered 2022-08-16: 75 mL via INTRAVENOUS

## 2022-08-16 MED ORDER — HEPARIN (PORCINE) 25000 UT/250ML-% IV SOLN: 1200.0000 [IU]/h | INTRAVENOUS | Status: DC

## 2022-08-16 MED ORDER — HEPARIN BOLUS VIA INFUSION
4200.0000 [IU] | Freq: Once | INTRAVENOUS | Status: DC
  Filled 2022-08-16: qty 4200

## 2022-08-16 MED ORDER — HEPARIN (PORCINE) 25000 UT/250ML-% IV SOLN
1600.0000 [IU]/h | INTRAVENOUS | Status: DC
  Administered 2022-08-16: 1200 [IU]/h via INTRAVENOUS
  Administered 2022-08-17: 1150 [IU]/h via INTRAVENOUS
  Filled 2022-08-16: qty 250

## 2022-08-16 MED ORDER — ENOXAPARIN SODIUM 80 MG/0.8ML IJ SOSY
1.0000 mg/kg | PREFILLED_SYRINGE | Freq: Once | INTRAMUSCULAR | Status: DC
  Filled 2022-08-16: qty 0.7

## 2022-08-16 NOTE — Progress Notes (Signed)
   Providing Compassionate, Quality Care - Together  NEUROSURGERY PROGRESS NOTE   S: Patient states beginning around 7 to 9 PM last night he had significant lower extremity swelling when he stand up to use the restroom.  O: EXAM:  BP 132/80 (BP Location: Left Arm)   Pulse 91   Temp 100.2 F (37.9 C) (Oral)   Resp 18   Ht 5\' 5"  (1.651 m)   Wt 69.9 kg   SpO2 100%   BMI 25.63 kg/m   Awake, alert, oriented x 3 PERRL Abdomen soft, nontender Speech fluent, appropriate  CNs grossly intact  5/5 BUE/BLE  Left lower extremity diffusely swollen, positive DP pulse Swelling extends up into the thigh and pelvis and left flank area   ASSESSMENT:  67 y.o. male with   Degenerative lumbar scoliosis, with sagittal and coronal imbalance   -Status post L2-5 LLIF, L5-S1 ALIF; T10-pelvis instrumentation and fusion   PLAN: -Lower extremity ultrasound negative yesterday, will repeat and obtain CT abdomen pelvis with venous phase as per Dr. Ainsley Spinner recommendation -High concern for left iliac DVT -Okay for anticoagulation from my standpoint as he has greater than 7 days postop from his decompression, instrumentation and fusion  Thank you for allowing me to participate in this patient's care.  Please do not hesitate to call with questions or concerns.   Elwin Sleight, [facility] Cell: [phone]

## 2022-08-16 NOTE — Progress Notes (Signed)
Lower extremity venous left study completed.  Preliminary results relayed to Carlis Abbott, MD.   See CV Proc for preliminary results report.   Darlin Coco, RDMS, RVT

## 2022-08-16 NOTE — Progress Notes (Signed)
ANTICOAGULATION CONSULT NOTE  Pharmacy Consult:  Heparin Indication: DVT  Allergies  Allergen Reactions   Penicillins Rash    Patient Measurements: Height: '5\' 5"'$  (165.1 cm) Weight: 69.9 kg (154 lb) IBW/kg (Calculated) : 61.5 Heparin Dosing Weight: 69.9 kg  Vital Signs: Temp: 98.4 F (36.9 C) (03/13 1931) Temp Source: Oral (03/13 1931) BP: 98/57 (03/13 1931) Pulse Rate: 92 (03/13 1931)  Labs: Recent Labs    08/14/22 1040 08/15/22 0215 08/16/22 0205 08/16/22 1850  HGB 7.2* 9.9* 10.5*  --   HCT 21.7* 30.0* 31.3*  --   PLT 359 319 239  --   HEPARINUNFRC  --   --   --  0.66  CREATININE 0.98  --  0.94  --      Estimated Creatinine Clearance: 64.5 mL/min (by C-G formula based on SCr of 0.94 mg/dL).   Assessment: 85 YOM admitted for cc of scoliosis admitted for surgical intervention 12 days ago. Currently has an extensive LLE DVT to include the iliac vein requiring anticoagulation. Patient received a SQ heparin injection 08/16/2022 at 05:22. PTA med list is negative for DOAC or warfarin usage as verified with refill records within the EMR. The consult was ordered with notation to give a bolus ok'd by neurology  Heparin level therapeutic and high normal at 0.66 units/mL.  No bleeding documented.  Goal of Therapy:  Heparin level 0.3-0.7 units/ml Monitor platelets by anticoagulation protocol: Yes   Plan:  Reduce heparin gtt slightly to 1150 units/hr Check 6 hr heparin level  Kimani Hovis D. Mina Marble, PharmD, BCPS, [place] 08/16/2022, 7:38 PM

## 2022-08-16 NOTE — Progress Notes (Signed)
ANTICOAGULATION CONSULT NOTE - Initial Consult  Pharmacy Consult for heparin gtt Indication: DVT  Allergies  Allergen Reactions   Penicillins Rash    Patient Measurements: Height: '5\' 5"'$  (165.1 cm) Weight: 69.9 kg (154 lb) IBW/kg (Calculated) : 61.5 Heparin Dosing Weight: 69.9 kg  Vital Signs: Temp: 100.2 F (37.9 C) (03/13 0738) Temp Source: Oral (03/13 0738) BP: 132/80 (03/13 0738) Pulse Rate: 91 (03/13 0738)  Labs: Recent Labs    08/14/22 1040 08/15/22 0215 08/16/22 0205  HGB 7.2* 9.9* 10.5*  HCT 21.7* 30.0* 31.3*  PLT 359 319 239  CREATININE 0.98  --  0.94    Estimated Creatinine Clearance: 64.5 mL/min (by C-G formula based on SCr of 0.94 mg/dL).   Medical History: Past Medical History:  Diagnosis Date   Anxiety    Arthritis    RA   Back pain    Chronic migraine    Depression    GERD (gastroesophageal reflux disease)    Heart murmur    History of kidney stones    Hypertension    Pneumonia    Raynaud's disease    Rheumatoid arthritis (HCC)     Medications:  Medications Prior to Admission  Medication Sig Dispense Refill Last Dose   acetaminophen (TYLENOL) 500 MG tablet Take 500 mg by mouth every 6 (six) hours as needed for moderate pain.   08/03/2022   alendronate (FOSAMAX) 70 MG tablet Take 70 mg by mouth every Saturday. Take with a full glass of water on an empty stomach.   07/29/2022   Calcium Carbonate-Vitamin D (CALCIUM 600+D PO) Take 1 tablet by mouth daily.   08/03/2022   clobetasol (OLUX) 0.05 % topical foam Apply topically 2 (two) times daily. (Patient taking differently: Apply 1 Application topically daily as needed (psoriasis).) 50 g 1 Past Week   cyclobenzaprine (FLEXERIL) 10 MG tablet Take 10 mg by mouth 3 (three) times daily as needed for muscle spasms.   Past Week   Fremanezumab-vfrm (AJOVY) 225 MG/1.5ML SOAJ Inject 225 mg into the skin every 30 (thirty) days.   07/15/2022   levETIRAcetam (KEPPRA) 750 MG tablet Take 750 mg by mouth 2 (two)  times daily.    08/04/2022 at 0430   lisinopril (ZESTRIL) 10 MG tablet TAKE 1 TABLET EVERY DAY 90 tablet 3 08/03/2022   LORazepam (ATIVAN) 1 MG tablet TAKE 1/2 TO 1 TABLET EVERY DAY AS NEEDED FOR ANXIETY AS SPARINGLY AS POSSIBLE 30 tablet 1 08/03/2022   Magnesium 400 MG TABS Take 400 mg by mouth every evening.   08/03/2022   naproxen sodium (ALEVE) 220 MG tablet Take 220 mg by mouth daily as needed (pain).   Past Month   pantoprazole (PROTONIX) 40 MG tablet Take 1 tablet (40 mg total) by mouth daily. 90 tablet 3 08/04/2022 at 0430   polyethylene glycol powder (GLYCOLAX/MIRALAX) powder Take 17 g by mouth daily. 3350 g prn 08/03/2022   pregabalin (LYRICA) 75 MG capsule TAKE 1 CAPSULE TWICE DAILY 60 capsule 1 08/04/2022 at 0430   minocycline (MINOCIN) 50 MG capsule TAKE 1 CAPSULE TWICE DAILY (Patient not taking: Reported on 07/27/2022) 180 capsule 2    tadalafil (CIALIS) 5 MG tablet Take 1 tablet (5 mg total) by mouth daily as needed for erectile dysfunction. (Patient not taking: Reported on 07/27/2022) 90 tablet 2    Upadacitinib ER (RINVOQ) 15 MG TB24 Take 15 mg by mouth daily.   07/08/2022   Scheduled:   sodium chloride   Intravenous Once   amLODipine  2.5 mg Oral Daily   Chlorhexidine Gluconate Cloth  6 each Topical Daily   docusate sodium  100 mg Oral BID   levETIRAcetam  750 mg Oral BID   lisinopril  10 mg Oral Daily   pantoprazole  40 mg Oral Daily   polyethylene glycol  17 g Oral Daily   pregabalin  75 mg Oral BID   rosuvastatin  5 mg Oral Daily   senna-docusate  1 tablet Oral QHS   sodium chloride flush  3 mL Intravenous Q12H    Assessment: 67 YOM admitted for cc of scoliosis admitted for surgical intervention 12 days ago. Currently has an extensive LLE DVT to include the iliac vein requiring anticoagulation. Patient received a SQ heparin injection 08/16/2022 at 05:22. PTA med list is negative for DOAC or warfarin usage as verified with refill records within the EMR. The consult was ordered with  notation to give a bolus ok'd by neurology  Goal of Therapy:  Heparin level 0.3-0.7 units/ml Monitor platelets by anticoagulation protocol: Yes   Plan:  Give 4200 units bolus x 1 Start heparin infusion at 1200 units/hr Check anti-Xa level in 8 hours and daily while on heparin Continue to monitor H&H and platelets  Jabbar Palmero BS, PharmD, BCPS Clinical Pharmacist 08/16/2022 9:59 AM  Contact: [phone] after 3 PM  "Be curious, not judgmental..." -Jamal Maes

## 2022-08-16 NOTE — Progress Notes (Signed)
[facility] [place]  Subjective  -notable left lower extremity edema that got much worse at 5 PM last night.   Objective 132/80 91 100.2 F (37.9 C) (Oral) 18 100%  Intake/Output Summary (Last 24 hours) at 08/16/2022 0838 Last data filed at 08/16/2022 0700 Gross per 24 hour  Intake 720 ml  Output 670 ml  Net 50 ml    Left DP palpable with significant left leg edema up to the groin  Laboratory Lab Results: Recent Labs    08/15/22 0215 08/16/22 0205  WBC 15.4* 18.7*  HGB 9.9* 10.5*  HCT 30.0* 31.3*  PLT 319 239   BMET Recent Labs    08/14/22 1040 08/16/22 0205  NA 134* 132*  K 3.8 4.4  CL 102 100  CO2 24 23  GLUCOSE 126* 136*  BUN 7* 6*  CREATININE 0.98 0.94  CALCIUM 8.0* 8.2*    COAG No results found for: "INR", "PROTIME" No results found for: "PTT"  Assessment/Planning:  Postop day 12 status post anterior spine exposure for L5-S1 ALIF via anterior retroperitoneal approach.  Last night had notable left lower extremity edema in the left leg up to the groin crease.  His exam is very concerning for an extensive DVT with likely iliac vein involvement.  Dr. Reatha Armour has ordered a CT abdomen pelvis and I have asked that he include a venous phase image.  I will repeat a venous duplex of the left leg to evaluate the iliac vein.  Per Dr. Reatha Armour we could start anticoagulation pending further workup if needed.  John Mcclain 08/16/2022 8:38 AM --

## 2022-08-16 NOTE — Progress Notes (Signed)
PT Cancellation Note  Patient Details Name: Essam Parkman MRN: [id number] DOB: August 31, 1955   Cancelled Treatment:    Reason Eval/Treat Not Completed: (P) Patient not medically ready (Pt positive for DVT and heparin was initiated this morning. Will continue to follow once medically ready.)   Michelle Nasuti 08/16/2022, 1:03 PM

## 2022-08-16 NOTE — Progress Notes (Signed)
Left leg venous duplex study positive for extensive left leg DVT including iliac vein involvement.  I spoke with Dr. Reatha Armour and will start heparin with bolus.  I ordered pharmacy consult.  Marty Heck, MD [facility] [place] Office: [place]

## 2022-08-16 NOTE — Progress Notes (Signed)
OT Cancellation Note  Patient Details Name: John Mcclain MRN: [id number] DOB: [birth year]   Cancelled Treatment:    Reason Eval/Treat Not Completed: Medical issues which prohibited therapy (new DVT and heparin initiated this morning.)  Elder Cyphers, OTR/L [facility] Acute Rehabilitation Office: [phone]   Magnus Ivan 08/16/2022, 2:45 PM

## 2022-08-17 ENCOUNTER — Other Ambulatory Visit (HOSPITAL_COMMUNITY): Payer: Medicare PPO

## 2022-08-17 ENCOUNTER — Inpatient Hospital Stay (HOSPITAL_COMMUNITY): Payer: Medicare PPO

## 2022-08-17 DIAGNOSIS — D72829 Elevated white blood cell count, unspecified: Secondary | ICD-10-CM

## 2022-08-17 DIAGNOSIS — I10 Essential (primary) hypertension: Secondary | ICD-10-CM | POA: Diagnosis not present

## 2022-08-17 DIAGNOSIS — N5089 Other specified disorders of the male genital organs: Secondary | ICD-10-CM

## 2022-08-17 DIAGNOSIS — N179 Acute kidney failure, unspecified: Secondary | ICD-10-CM | POA: Diagnosis not present

## 2022-08-17 LAB — PROCALCITONIN: Procalcitonin: 0.37 ng/mL

## 2022-08-17 LAB — HEPARIN LEVEL (UNFRACTIONATED)
Heparin Unfractionated: 0.41 IU/mL (ref 0.30–0.70)
Heparin Unfractionated: 0.35 IU/mL (ref 0.30–0.70)
Heparin Unfractionated: <0.1 IU/mL — ABNORMAL LOW (ref 0.30–0.70)
Heparin Unfractionated: <0.1 IU/mL — ABNORMAL LOW (ref 0.30–0.70)

## 2022-08-17 LAB — CBC WITH DIFFERENTIAL/PLATELET
Abs Immature Granulocytes: 0.84 10*3/uL — ABNORMAL HIGH (ref 0.00–0.07)
Basophils Absolute: 0.1 10*3/uL (ref 0.0–0.1)
Basophils Relative: 0 %
Eosinophils Absolute: 0.2 10*3/uL (ref 0.0–0.5)
Eosinophils Relative: 1 %
HCT: 30.6 % — ABNORMAL LOW (ref 39.0–52.0)
Hemoglobin: 10.2 g/dL — ABNORMAL LOW (ref 13.0–17.0)
Immature Granulocytes: 4 %
Lymphocytes Relative: 4 %
Lymphs Abs: 1 10*3/uL (ref 0.7–4.0)
MCH: 30.9 pg (ref 26.0–34.0)
MCHC: 33.3 g/dL (ref 30.0–36.0)
MCV: 92.7 fL (ref 80.0–100.0)
Monocytes Absolute: 1.8 10*3/uL — ABNORMAL HIGH (ref 0.1–1.0)
Monocytes Relative: 8 %
Neutro Abs: 18.8 10*3/uL — ABNORMAL HIGH (ref 1.7–7.7)
Neutrophils Relative %: 83 %
Platelets: 135 10*3/uL — ABNORMAL LOW (ref 150–400)
RBC: 3.3 MIL/uL — ABNORMAL LOW (ref 4.22–5.81)
RDW: 15.7 % — ABNORMAL HIGH (ref 11.5–15.5)
WBC: 22.7 10*3/uL — ABNORMAL HIGH (ref 4.0–10.5)
nRBC: 0 % (ref 0.0–0.2)

## 2022-08-17 LAB — CBC
HCT: 31.7 % — ABNORMAL LOW (ref 39.0–52.0)
Hemoglobin: 10.3 g/dL — ABNORMAL LOW (ref 13.0–17.0)
MCH: 30.3 pg (ref 26.0–34.0)
MCHC: 32.5 g/dL (ref 30.0–36.0)
MCV: 93.2 fL (ref 80.0–100.0)
Platelets: 135 10*3/uL — ABNORMAL LOW (ref 150–400)
RBC: 3.4 MIL/uL — ABNORMAL LOW (ref 4.22–5.81)
RDW: 15.9 % — ABNORMAL HIGH (ref 11.5–15.5)
WBC: 24 10*3/uL — ABNORMAL HIGH (ref 4.0–10.5)
nRBC: 0 % (ref 0.0–0.2)

## 2022-08-17 LAB — BASIC METABOLIC PANEL
Anion gap: 10 (ref 5–15)
BUN: 14 mg/dL (ref 8–23)
CO2: 23 mmol/L (ref 22–32)
Calcium: 8.3 mg/dL — ABNORMAL LOW (ref 8.9–10.3)
Chloride: 97 mmol/L — ABNORMAL LOW (ref 98–111)
Creatinine, Ser: 1.48 mg/dL — ABNORMAL HIGH (ref 0.61–1.24)
GFR, Estimated: 51 mL/min — ABNORMAL LOW (ref >60)
Glucose, Bld: 140 mg/dL — ABNORMAL HIGH (ref 70–99)
Potassium: 4.7 mmol/L (ref 3.5–5.1)
Sodium: 130 mmol/L — ABNORMAL LOW (ref 135–145)

## 2022-08-17 LAB — URINALYSIS, ROUTINE W REFLEX MICROSCOPIC
Bilirubin Urine: NEGATIVE
Glucose, UA: NEGATIVE mg/dL
Hgb urine dipstick: NEGATIVE
Ketones, ur: NEGATIVE mg/dL
Leukocytes,Ua: NEGATIVE
Nitrite: NEGATIVE
Protein, ur: NEGATIVE mg/dL
Specific Gravity, Urine: 1.004 — ABNORMAL LOW (ref 1.005–1.030)
pH: 6 (ref 5.0–8.0)

## 2022-08-17 MED ORDER — VANCOMYCIN HCL IN DEXTROSE 1-5 GM/200ML-% IV SOLN
1000.0000 mg | INTRAVENOUS | Status: DC
  Filled 2022-08-17: qty 200

## 2022-08-17 MED ORDER — SODIUM CHLORIDE 0.9 % IV SOLN: INTRAVENOUS | Status: DC

## 2022-08-17 NOTE — Progress Notes (Addendum)
Progress Note    08/17/2022 8:06 AM 9 Days Post-Op  Subjective: L leg has improved since initiation of heparin last night   Vitals:   08/17/22 0500 08/17/22 0758  BP: (!) 120/57 (!) 96/54  Pulse: 82 87  Resp: 16 17  Temp: 98 F (36.7 C) 98.8 F (37.1 C)  SpO2: 100% 100%   Physical Exam: Lungs:  non labored Incisions:  abd incision c/d/i Extremities:  palpable pedal pulses Abdomen:  soft, NT, ND Neurologic: A&O  CBC    Component Value Date/Time   WBC 24.0 (H) 08/17/2022 0251   RBC 3.40 (L) 08/17/2022 0251   HGB 10.3 (L) 08/17/2022 0251   HGB 16.4 11/12/2018 1636   HCT 31.7 (L) 08/17/2022 0251   HCT 48.3 11/12/2018 1636   PLT 135 (L) 08/17/2022 0251   PLT 290 11/12/2018 1636   MCV 93.2 08/17/2022 0251   MCV 102 (H) 11/12/2018 1636   MCH 30.3 08/17/2022 0251   MCHC 32.5 08/17/2022 0251   RDW 15.9 (H) 08/17/2022 0251   RDW 14.4 11/12/2018 1636   LYMPHSABS 0.9 01/11/2021 1632   LYMPHSABS 2.2 11/12/2018 1636   MONOABS 0.8 01/11/2021 1632   EOSABS 0.0 01/11/2021 1632   EOSABS 0.0 11/12/2018 1636   BASOSABS 0.0 01/11/2021 1632   BASOSABS 0.0 11/12/2018 1636    BMET    Component Value Date/Time   NA 130 (L) 08/17/2022 0251   NA 141 07/13/2020 1613   K 4.7 08/17/2022 0251   CL 97 (L) 08/17/2022 0251   CO2 23 08/17/2022 0251   GLUCOSE 140 (H) 08/17/2022 0251   BUN 14 08/17/2022 0251   BUN 10 07/13/2020 1613   CREATININE 1.48 (H) 08/17/2022 0251   CREATININE 1.07 11/30/2015 1441   CALCIUM 8.3 (L) 08/17/2022 0251   GFRNONAA 51 (L) 08/17/2022 0251   GFRNONAA 74 11/30/2015 1441   GFRAA 80 07/13/2020 1613   GFRAA 86 11/30/2015 1441    INR No results found for: "INR"   Intake/Output Summary (Last 24 hours) at 08/17/2022 0806 Last data filed at 08/17/2022 0300 Gross per 24 hour  Intake 196.39 ml  Output --  Net 196.39 ml     Assessment/Plan:  67 y.o. male is s/p ALIF with subsequent LLE DVT 9 Days Post-Op   Subjectively, L leg edema has improved  since initiation of heparin last night although still unable to bear weight due to pain L foot well perfused with 2+ palpable PT pulse Continue heparin; CBC stable Dr. Carlis Abbott will evaluate the patient later today and provide further treatment plans   Dagoberto Ligas, PA-C [facility] [phone] 08/17/2022 8:06 AM  I have seen and evaluated the patient. I agree with the PA note as documented above.  Postop day 13 status post anterior spine exposure for L5-S1 ALIF.  Yesterday was noted to have marked left lower extremity edema and duplex confirmed extensive left leg DVT with iliac vein involvement.  There was no associated iliac vein injury during his exposure and this was straightforward.  Overnight he has been started on heparin with significant symptomatic improvement in the left leg.  He has a palpable pedal pulse with no signs of phlegmasia.  I have wrapped his left leg with an Ace from the foot all the way up to the thigh.  I have asked that he continue to elevate his leg.  Discussed if he does not see significant improvement we can consider percutaneous thrombectomy but I would like to wait on  this if possible given his post-op status with RP exposure and again if he gets better with heparin and elevation alone that may be the safest option.  Marty Heck, MD [facility] [place] Office: [phone]

## 2022-08-17 NOTE — Progress Notes (Addendum)
   Providing Compassionate, Quality Care - Together  NEUROSURGERY PROGRESS NOTE  S: Patient states LLE swelling has begun to decrease.    O: EXAM:  BP (!) 96/54 (BP Location: Left Arm)   Pulse 87   Temp 98.8 F (37.1 C) (Oral)   Resp 17   Ht 5\' 5"  (1.651 m)   Wt 69.9 kg   SpO2 100%   BMI 25.63 kg/m     Awake, alert, oriented x 3 PERRL Abdomen soft, nontender Speech fluent, appropriate  CNs grossly intact  5/5 BUE/BLE  Left lower extremity diffusely swollen, positive DP pulse Swelling extends up into the thigh and pelvis and left flank area     ASSESSMENT:  67 y.o. male with    Degenerative lumbar scoliosis, with sagittal and coronal imbalance   -Status post L2-5 LLIF, L5-S1 ALIF; T10-pelvis instrumentation and fusion   PLAN: -Left leg venous duplex study positive for extensive left leg DVT including iliac vein involvement  -Continue anticoagulation per Dr. Ainsley Spinner recommendations. Ok from neurosurgery standpoint as pt is more than 7 days postop from his decompression, instrumentation and fusion.  -Will consult Hospitalist for management of worsening leukocytosis and AKI.  -Call w/ questions/concerns.   Caroline More, PAC   Pt s/e, agree with above, doing well on anticoag, will start oral anticoag tomorrow

## 2022-08-17 NOTE — Progress Notes (Signed)
Physical Therapy Treatment Patient Details Name: John Mcclain MRN: [id number] DOB: 04/22/1956 Today's Date: 08/17/2022   History of Present Illness Pt is a 67 y.o male admitted 3/1 s/p anterior lumbar interbody fusion L5-S1; anterior lateral interbody fusion L2-5 (part 1 of 2). s/p open thoracolumbar instrumentation, fusion of T10-pelvis, L3-S1 decompression, L2-5 posterior column osteotomies (part 2 of 2). + DVT 3/13 LLE. PMH significant for anxiety, RA, depression, GERD, heart murmur, HTN, Raynaud's.    PT Comments    Patient with some decline in functional independence since last visit, noted (+DVT) limiting tolerance of WB and functional ROM through LLE with activities today. Mobilizing pretty well but needing min guard at times for safety. Remains in good spirits. May be beneficial for short term acute inpatient rehab due to complications and set-backs. Good family support at home and likely to progress to mod I level with some intensive rehab prior to d/c. Will follow and progress as tolerated. Hopefully as LLE pain and edema improve he will bounce back quickly and improve function again. Patient will continue to benefit from skilled physical therapy services to further improve independence with functional mobility.    Recommendations for follow up therapy are one component of a multi-disciplinary discharge planning process, led by the attending physician.  Recommendations may be updated based on patient status, additional functional criteria and insurance authorization.  Follow Up Recommendations  Acute inpatient rehab (3hours/day)     Assistance Recommended at Discharge Intermittent Supervision/Assistance  Patient can return home with the following A little help with bathing/dressing/bathroom;Assistance with cooking/housework;Help with stairs or ramp for entrance;Assist for transportation;A little help with walking and/or transfers   Equipment Recommendations  None recommended by PT     Recommendations for Other Services Rehab consult     Precautions / Restrictions Precautions Precautions: Back Precaution Booklet Issued: Yes (comment) Precaution Comments: reviewed Restrictions Weight Bearing Restrictions: No     Mobility  Bed Mobility Overal bed mobility: Needs Assistance Bed Mobility: Rolling, Sidelying to Sit, Sit to Sidelying Rolling: Supervision Sidelying to sit: Supervision     Sit to sidelying: Min guard General bed mobility comments: Supervision to roll towards Rt, using rail, and to rise to EOB, slower and guarded today. Min guard to return back to bed, some difficulty raising LLE into bed but did not require physical assist.    Transfers Overall transfer level: Needs assistance Equipment used: Rolling walker (2 wheels) Transfers: Sit to/from Stand Sit to Stand: Min guard           General transfer comment: Min guard for sit to stand, cues for hand placement with notable difficulty rising due to limited WB tolerance and flexion of Lt hip when rising.    Ambulation/Gait Ambulation/Gait assistance: Min guard Gait Distance (Feet): 85 Feet Assistive device: Rolling walker (2 wheels) Gait Pattern/deviations: Step-through pattern, Trunk flexed, Antalgic Gait velocity: decr Gait velocity interpretation: <1.31 ft/sec, indicative of household ambulator   General Gait Details: Moderately antalgic with limited WB tolerance through LLE. Cues for technique and RW use to unload LLE as needed for comfort. Intermittent erratic control of RW however no overt LOB or buckling. Min guard for safety throughout distance. Distance limited primarily by pain LLE and somewhat in back due to increased UE use.   Stairs             Wheelchair Mobility    Modified Rankin (Stroke Patients Only)       Balance Overall balance assessment: Needs assistance Sitting-balance support: No upper extremity  supported, Feet supported Sitting balance-Leahy Scale:  Good Sitting balance - Comments: Mostly due to guarding/pain   Standing balance support: No upper extremity supported Standing balance-Leahy Scale: Fair Standing balance comment: Preference of UE support on RW today.                            Cognition Arousal/Alertness: Awake/alert Behavior During Therapy: WFL for tasks assessed/performed Overall Cognitive Status: Within Functional Limits for tasks assessed                                          Exercises General Exercises - Lower Extremity Ankle Circles/Pumps: AROM, Both, 10 reps, Supine Quad Sets: Strengthening, Both, 10 reps, Supine    General Comments General comments (skin integrity, edema, etc.): LLE still edematous, elevated. Maintains back precautions. Heparin levels within target      Pertinent Vitals/Pain Pain Assessment Pain Assessment: Faces Faces Pain Scale: Hurts little more Pain Location: Spinal surgical site, Lt groin and LLE to a degree Pain Descriptors / Indicators: Aching, Sore, Tightness Pain Intervention(s): Monitored during session, Repositioned, Limited activity within patient's tolerance    Home Living                          Prior Function            PT Goals (current goals can now be found in the care plan section) Acute Rehab PT Goals Patient Stated Goal: Get well PT Goal Formulation: With patient Time For Goal Achievement: 08/19/22 Potential to Achieve Goals: Good Progress towards PT goals: Progressing toward goals    Frequency    Min 4X/week      PT Plan Discharge plan needs to be updated    Co-evaluation              AM-PAC PT "6 Clicks" Mobility   Outcome Measure  Help needed turning from your back to your side while in a flat bed without using bedrails?: A Little Help needed moving from lying on your back to sitting on the side of a flat bed without using bedrails?: A Little Help needed moving to and from a bed to a chair  (including a wheelchair)?: A Little Help needed standing up from a chair using your arms (e.g., wheelchair or bedside chair)?: A Little Help needed to walk in hospital room?: A Little Help needed climbing 3-5 steps with a railing? : A Little 6 Click Score: 18    End of Session Equipment Utilized During Treatment: Gait belt Activity Tolerance: Patient tolerated treatment well Patient left: with call bell/phone within reach;in bed Nurse Communication: Mobility status PT Visit Diagnosis: Unsteadiness on feet (R26.81);Other abnormalities of gait and mobility (R26.89);Muscle weakness (generalized) (M62.81);Difficulty in walking, not elsewhere classified (R26.2);Pain Pain - Right/Left: Right Pain - part of body: Hip     Time: 1415-1441 PT Time Calculation (min) (ACUTE ONLY): 26 min  Charges:  $Gait Training: 8-22 mins $Therapeutic Activity: 8-22 mins                     Candie Mile, PT, DPT Physical Therapist Acute Rehabilitation Services [place] 08/17/2022, 2:51 PM

## 2022-08-17 NOTE — Consult Note (Signed)
History and Physical    John Mcclain [id number] DOB: Oct 22, 1955 DOA: 08/04/2022  PCP: Horald Pollen, MD Patient coming from: Home  Reason for consult: Medical management; leukocytosis Consulting physician: Elwin Sleight, DO  HPI: John Mcclain is a 67 y.o. male with medical history significant of depression, anxiety, hypertension, rheumatoid arthritis, GERD, back pain. Patient reports post-op back pain that is worsening slightly for about the past two days since developing symptoms from an acute LLE DVT. Analgesics have been helping to control his pain. Patient reports no coughing, sneezing, rhinorrhea, nausea, vomiting. Patient reports some abdominal pain and some loose stools. No dysuria. He has noticed some scrotal edema without associated pain but does have tightness and discomfort with urination.  Review of Systems: Per HPI  Past Medical History:  Diagnosis Date   Anxiety    Arthritis    RA   Back pain    Chronic migraine    Depression    GERD (gastroesophageal reflux disease)    Heart murmur    History of kidney stones    Hypertension    Pneumonia    Raynaud's disease    Rheumatoid arthritis ([place])     Past Surgical History:  Procedure Laterality Date   ABDOMINAL EXPOSURE N/A 08/04/2022   Procedure: ABDOMINAL EXPOSURE;  Surgeon: Marty Heck, MD;  Location: [place];  Service: Vascular;  Laterality: N/A;   ANTERIOR LAT LUMBAR FUSION N/A 08/04/2022   Procedure: Anterior Lumbar Interbody Fusion Lumbar Five-Sacral One;  Surgeon: Karsten Ro, DO;  Location: [place];  Service: Neurosurgery;  Laterality: N/A;   ANTERIOR LAT LUMBAR FUSION N/A 08/04/2022   Procedure: ANTERIOR LATERAL INTERBODY FUSION LUMBAR TWO-THREE, LUMBAR THREE-FOUR, LUMBAR FOUR-FIVE;  Surgeon: Dawley, Theodoro Doing, DO;  Location: [place];  Service: Neurosurgery;  Laterality: N/A;   COLON SURGERY  2000   polyps removed   COLONOSCOPY WITH ESOPHAGOGASTRODUODENOSCOPY (EGD)  2023   ESOPHAGEAL DILATION  2023    LAMINECTOMY WITH POSTERIOR LATERAL ARTHRODESIS LEVEL 3 N/A 08/08/2022   Procedure: OPEN THORACOLUMBAR INTRUMENTATION,FUSION, THORACIC TEN-PELVIS, LUMBAR THREE-SACRAL ONE DECOMPRESSION  LUMBAR TWO-LUMBAR FIVE POSTERIOR COLUMN OSTEOTOMIES;  Surgeon: Dawley, Theodoro Doing, DO;  Location: [place];  Service: Neurosurgery;  Laterality: N/A;   LYMPH NODE DISSECTION     removed lymph node   VASECTOMY       reports that he has never smoked. He has never used smokeless tobacco. He reports current alcohol use. He reports that he does not use drugs.  Allergies  Allergen Reactions   Penicillins Rash    Family History  Problem Relation Age of Onset   Kidney disease Mother    Hypertension Mother    Arthritis Mother        RA   COPD Mother    Hyperlipidemia Mother    Heart attack Father    Diabetes Father    COPD Father    Hypertension Father    Migraines Brother    Stroke Maternal Grandfather    Alzheimer's disease Paternal Grandmother    Colon cancer Neg Hx    Pancreatic cancer Neg Hx    Esophageal cancer Neg Hx    Prior to Admission medications   Medication Sig Start Date End Date Taking? Authorizing Provider  acetaminophen (TYLENOL) 500 MG tablet Take 500 mg by mouth every 6 (six) hours as needed for moderate pain.   Yes [provider]  alendronate (FOSAMAX) 70 MG tablet Take 70 mg by mouth every Saturday. Take with a full glass of  water on an empty stomach.   Yes [provider]  Calcium Carbonate-Vitamin D (CALCIUM 600+D PO) Take 1 tablet by mouth daily.   Yes [provider]  clobetasol (OLUX) 0.05 % topical foam Apply topically 2 (two) times daily. Patient taking differently: Apply 1 Application topically daily as needed (psoriasis). 05/24/22  Yes Sagardia, Ines Bloomer, MD  cyclobenzaprine (FLEXERIL) 10 MG tablet Take 10 mg by mouth 3 (three) times daily as needed for muscle spasms.   Yes [provider]  Fremanezumab-vfrm (AJOVY) 225 MG/1.5ML SOAJ Inject  225 mg into the skin every 30 (thirty) days.   Yes [provider]  levETIRAcetam (KEPPRA) 750 MG tablet Take 750 mg by mouth 2 (two) times daily.    Yes [provider]  lisinopril (ZESTRIL) 10 MG tablet TAKE 1 TABLET EVERY DAY 08/15/21  Yes Sagardia, Ines Bloomer, MD  LORazepam (ATIVAN) 1 MG tablet TAKE 1/2 TO 1 TABLET EVERY DAY AS NEEDED FOR ANXIETY AS SPARINGLY AS POSSIBLE 06/26/22  Yes Sagardia, Ines Bloomer, MD  Magnesium 400 MG TABS Take 400 mg by mouth every evening.   Yes [provider]  naproxen sodium (ALEVE) 220 MG tablet Take 220 mg by mouth daily as needed (pain).   Yes [provider]  oxyCODONE-acetaminophen (PERCOCET) 5-325 MG tablet Take 1 tablet by mouth every 4 (four) hours as needed for up to 7 days for severe pain. 08/14/22 08/21/22 Yes Caroline More Caylin, PA-C  pantoprazole (PROTONIX) 40 MG tablet Take 1 tablet (40 mg total) by mouth daily. 08/15/21  Yes Cirigliano, Vito V, DO  polyethylene glycol powder (GLYCOLAX/MIRALAX) powder Take 17 g by mouth daily. 05/24/17  Yes Tereasa Coop, PA-C  pregabalin (LYRICA) 75 MG capsule TAKE 1 CAPSULE TWICE DAILY 06/27/22  Yes Barnet Pall E, NP  amLODipine (NORVASC) 2.5 MG tablet TAKE 1 TABLET EVERY DAY (NEED TO SCHEDULE A 6 MONTH FOLLOW UP APPOINTMENT) 08/15/22   Horald Pollen, MD  minocycline (MINOCIN) 50 MG capsule TAKE 1 CAPSULE TWICE DAILY Patient not taking: Reported on 07/27/2022 01/30/22   Lavonna Monarch, MD  rosuvastatin (CRESTOR) 5 MG tablet TAKE 1 TABLET EVERY DAY 08/13/22   Horald Pollen, MD  tadalafil (CIALIS) 5 MG tablet Take 1 tablet (5 mg total) by mouth daily as needed for erectile dysfunction. Patient not taking: Reported on 07/27/2022 02/04/21 05/05/21  Horald Pollen, MD  Upadacitinib ER [facility]) 15 MG TB24 Take 15 mg by mouth daily. 07/17/18   [provider]    Physical Exam:  Physical Exam Constitutional:      Appearance: Normal appearance. He is  normal weight.  HENT:     Mouth/Throat:     Mouth: Mucous membranes are moist.     Pharynx: Oropharynx is clear. No oropharyngeal exudate or posterior oropharyngeal erythema.  Eyes:     Extraocular Movements: Extraocular movements intact.     Conjunctiva/sclera: Conjunctivae normal.  Cardiovascular:     Rate and Rhythm: Normal rate and regular rhythm.     Heart sounds: No murmur heard. Pulmonary:     Effort: Pulmonary effort is normal.     Breath sounds: Normal breath sounds. No wheezing or rales.  Abdominal:     General: Abdomen is flat. Bowel sounds are normal. There is no distension.     Palpations: Abdomen is soft.     Tenderness: There is no abdominal tenderness.  Genitourinary:    Pubic Area: No rash.      Comments: Significant scrotal swelling.  Right testicle located. Could not locate left testicle. No tenderness noted. Musculoskeletal:     Cervical back: Normal range of motion and neck supple.     Left lower leg: 2+ Edema present.  Neurological:     Mental Status: He is alert.    Labs on Admission: I have personally reviewed following labs and imaging studies  CBC: Recent Labs  Lab 08/16/22 0205 08/17/22 0251 08/17/22 0732  WBC 18.7* 24.0* 22.7*  NEUTROABS  --   --  18.8*  HGB 10.5* 10.3* 10.2*  HCT 31.3* 31.7* 30.6*  MCV 90.2 93.2 92.7  PLT 239 135* 135*    Basic Metabolic Panel: Recent Labs  Lab 08/14/22 1040 08/16/22 0205 08/17/22 0251  NA 134* 132* 130*  K 3.8 4.4 4.7  CL 102 100 97*  CO2 '24 23 23  '$ GLUCOSE 126* 136* 140*  BUN 7* 6* 14  CREATININE 0.98 0.94 1.48*  CALCIUM 8.0* 8.2* 8.3*    GFR: Estimated Creatinine Clearance: 41 mL/min (A) (by C-G formula based on SCr of 1.48 mg/dL (H)).  Radiological Exams on Admission: VAS [place] LOWER EXTREMITY VENOUS (DVT)  Result Date: 08/16/2022  Lower Venous DVT Study Patient Name:  AUGUSTO BUCKO  Date of Exam:   08/16/2022 Medical Rec #: [id number]      Accession #:    [id number] Date of Birth: November 05, 1955        Patient Gender: M Patient Age:   42 years Exam Location:  [facility] Procedure:      VAS [place] LOWER EXTREMITY VENOUS (DVT) Referring Phys: Monica Martinez --------------------------------------------------------------------------------  Indications: Worsening left leg swelling with pain since last night, patient s/p lumbar surgeries on 08/04/2022 (ALIF) and 08/08/2022.  Comparison Study: Prior left lower extremity venous study performed 08-15-2022                   was negative for DVT. Performing Technologist: Darlin Coco RDMS, RVT  Examination Guidelines: A complete evaluation includes B-mode imaging, spectral Doppler, color Doppler, and power Doppler as needed of all accessible portions of each vessel. Bilateral testing is considered an integral part of a complete examination. Limited examinations for reoccurring indications may be performed as noted. The reflux portion of the exam is performed with the patient in reverse Trendelenburg.  +-----+---------------+---------+-----------+----------+--------------+ RIGHTCompressibilityPhasicitySpontaneityPropertiesThrombus Aging +-----+---------------+---------+-----------+----------+--------------+ EIV                 Yes      Yes                                 +-----+---------------+---------+-----------+----------+--------------+ CIV                 Yes      Yes                                 +-----+---------------+---------+-----------+----------+--------------+   +---------+---------------+---------+-----------+---------------+--------------+ LEFT     CompressibilityPhasicitySpontaneityProperties     Thrombus Aging +---------+---------------+---------+-----------+---------------+--------------+ CFV      None           No       No                        Acute          +---------+---------------+---------+-----------+---------------+--------------+ SFJ      None  No       No                         Acute          +---------+---------------+---------+-----------+---------------+--------------+ FV Prox  None           No       No                        Acute          +---------+---------------+---------+-----------+---------------+--------------+ FV Mid   None           No       No         Sluggish Flow                 +---------+---------------+---------+-----------+---------------+--------------+ FV DistalNone           No       No         Sluggish Flow                 +---------+---------------+---------+-----------+---------------+--------------+ PFV      None           No       No                        Acute          +---------+---------------+---------+-----------+---------------+--------------+ POP      Full           No       No         Sluggish Flow                 +---------+---------------+---------+-----------+---------------+--------------+ PTV      Full                                                             +---------+---------------+---------+-----------+---------------+--------------+ PERO     Partial        No       No                        Acute          +---------+---------------+---------+-----------+---------------+--------------+ EIV      None           No       No                        Acute          +---------+---------------+---------+-----------+---------------+--------------+ CIV      None           No       No         Occlusive      Acute                                                      Mid-Distal,  Patent at                                                                 Origin                        +---------+---------------+---------+-----------+---------------+--------------+     Summary: LEFT: - Findings consistent with acute deep vein thrombosis involving the left common femoral vein, external iliac vein, common iliac  vein, SF junction, left femoral vein, left proximal profunda vein, and left peroneal veins.  - Findings suggest new clot progression as compared to previous examination.  - No cystic structure found in the popliteal fossa.  *See table(s) above for measurements and observations. Electronically signed by Monica Martinez MD on 08/16/2022 at 10:41:02 AM.    Final    CT ABDOMEN PELVIS W CONTRAST  Result Date: 08/16/2022 CLINICAL DATA:  Pelvic and left lower extremity swelling. EXAM: CT ABDOMEN AND PELVIS WITH CONTRAST TECHNIQUE: Multidetector CT imaging of the abdomen and pelvis was performed using the standard protocol following bolus administration of intravenous contrast. RADIATION DOSE REDUCTION: This exam was performed according to the departmental dose-optimization program which includes automated exposure control, adjustment of the mA and/or kV according to patient size and/or use of iterative reconstruction technique. CONTRAST:  42m OMNIPAQUE IOHEXOL 350 MG/ML SOLN COMPARISON:  Abdominopelvic CT 05/24/2015.  Lumbar MRI 10/17/2021. FINDINGS: There is a poor contrast bolus resulting in limited vascular and parenchymal opacification. Lower chest: Minimal dependent atelectasis or scarring at both lung bases. No confluent airspace disease, pleural effusion or pneumothorax. Aortic and coronary artery atherosclerosis noted. Hepatobiliary: As above, poor contrast bolus limits assessment of the liver. No focal abnormality identified. The gallbladder appears mildly distended without evidence of calcified gallstones, wall thickening or surrounding inflammation. There is no biliary ductal dilatation. Pancreas: Mild atrophy. No evidence of focal abnormality, ductal dilatation or surrounding inflammation. Spleen: Normal in size without focal abnormality. Adrenals/Urinary Tract: Both adrenal glands appear normal. No evidence of urinary tract calculus, suspicious renal lesion or hydronephrosis. 8 mm low-density lesion  emanating from the lower pole of the left kidney on image 48/3 measures water density and is consistent with a small cyst. No specific follow-up imaging recommended. As above, limited contrast bolus. There is excretion of contrast into the renal collecting systems bilaterally on the delayed images. Possible mild bladder wall thickening versus incomplete distension. Stomach/Bowel: No enteric contrast administered. The stomach appears unremarkable for its degree of distension. No evidence of bowel wall thickening, distention or surrounding inflammatory change. The appendix appears normal. Vascular/Lymphatic: There are no enlarged abdominal or pelvic lymph nodes. Aortic and branch vessel atherosclerosis without evidence of aneurysm. As above, there is limited vascular assessment due to the poor contrast bolus. In addition, evaluation of the retroperitoneum and false pelvis is mildly limited by artifact from the spinal hardware. However, there is ill-defined enlargement of the left common and external iliac veins with suspected ill-defined intraluminal low-density, suspicious for pelvic vein deep venous thrombosis. There is associated perivascular soft tissue stranding on the left. The right iliac veins appear grossly unremarkable. The IVC appears patent. Reproductive: The prostate gland and seminal vesicles appear unremarkable. Other: As above, left pelvic soft tissue stranding around the left iliac vessels. Associated mild perirectal soft  tissue stranding without focal fluid collection. There is a small umbilical hernia containing only fat. No ascites or free air. Musculoskeletal: Diffuse soft tissue swelling throughout the proximal left thigh which is enlarged compared with the right. Extensive postsurgical changes related to recent spinal fusion extending from the lower thoracic region through the bilateral sacroiliac joints. The hardware appears well positioned. No evidence of acute fracture. There is a residual  mild convex right thoracic scoliosis. IMPRESSION: 1. Findings are highly suspicious for left pelvic vein deep venous thrombosis with associated left pelvic and proximal left thigh soft tissue swelling. Right iliac veins and IVC appear grossly patent. Examination is limited by poor contrast bolus and artifact from the spinal fusion. Recommend further evaluation with lower extremity venous Doppler ultrasound. The extent of the suspected iliac vein thrombosis could be addressed with repeat CT with better contrast bolus or MRI. 2. No other acute abdominopelvic findings identified. 3. Extensive postsurgical changes related to recent spinal fusion. 4. Mild gallbladder distension without evidence of calcified gallstones or surrounding inflammation. Possible mild bladder wall thickening versus incomplete distension. 5.  Aortic Atherosclerosis (ICD10-I70.0). 6. These results will be called to the ordering clinician or representative by the Radiologist Assistant, and communication documented in the PACS or [employer]. Electronically Signed   By: Richardean Sale M.D.   On: 08/16/2022 09:50   VAS [place] LOWER EXTREMITY VENOUS (DVT)  Result Date: 08/15/2022  Lower Venous DVT Study Patient Name:  BRYLIN HANAUER  Date of Exam:   08/15/2022 Medical Rec #: [id number]      Accession #:    [id number] Date of Birth: 03/31/1956       Patient Gender: M Patient Age:   22 years Exam Location:  [facility] Procedure:      VAS [place] LOWER EXTREMITY VENOUS (DVT) Referring Phys: Glenford Peers --------------------------------------------------------------------------------  Indications: Asymmetrical left leg swelling s/p lumbar surgery.  Comparison Study: No prior studies. Performing Technologist: Darlin Coco RDMS, RVT  Examination Guidelines: A complete evaluation includes B-mode imaging, spectral Doppler, color Doppler, and power Doppler as needed of all accessible portions of each vessel. Bilateral testing is considered an integral  part of a complete examination. Limited examinations for reoccurring indications may be performed as noted. The reflux portion of the exam is performed with the patient in reverse Trendelenburg.  +-----+---------------+---------+-----------+----------+--------------+ RIGHTCompressibilityPhasicitySpontaneityPropertiesThrombus Aging +-----+---------------+---------+-----------+----------+--------------+ CFV  Full           Yes      Yes                                 +-----+---------------+---------+-----------+----------+--------------+   +---------+---------------+---------+-----------+----------+--------------+ LEFT     CompressibilityPhasicitySpontaneityPropertiesThrombus Aging +---------+---------------+---------+-----------+----------+--------------+ CFV      Full           Yes      Yes                                 +---------+---------------+---------+-----------+----------+--------------+ SFJ      Full                                                        +---------+---------------+---------+-----------+----------+--------------+ FV Prox  Full                                                        +---------+---------------+---------+-----------+----------+--------------+  FV Mid   Full                                                        +---------+---------------+---------+-----------+----------+--------------+ FV DistalFull                                                        +---------+---------------+---------+-----------+----------+--------------+ PFV      Full                                                        +---------+---------------+---------+-----------+----------+--------------+ POP      Full           Yes      Yes                                 +---------+---------------+---------+-----------+----------+--------------+ PTV      Full                                                         +---------+---------------+---------+-----------+----------+--------------+ PERO     Full                                                        +---------+---------------+---------+-----------+----------+--------------+     Summary: RIGHT: - No evidence of common femoral vein obstruction.  LEFT: - There is no evidence of deep vein thrombosis in the lower extremity.  - No cystic structure found in the popliteal fossa.  *See table(s) above for measurements and observations. Electronically signed by Servando Snare MD on 08/15/2022 at 4:51:56 PM.    Final      Assessment/Plan  Leukocytosis Unclear etiology. Possibly related to extensive DVT of left lower extremity. Associated elevated temperature without fever. Patient without acute symptoms to suggest infection source. Per neurosurgery, surgical site does not appear infected. WBC appears to have possibly peaked. No antibiotics initiated recently for treatment. Blood cultures (3/13) are no growth to date. Procalcitonin is low at 0.37. -Urinalysis -Follow-up blood cultures -Hold antibiotics for now  AKI Baseline creatinine of 1. Creatinine up to 1.48 today. Presumed secondary to dehydration. IV fluids already started. Complicated by lisinopril use. -Discontinue lisinopril for now -Continue IV fluids -Check urinalysis  Primary hypertension Blood pressures are low-normal. Patient likely with hypovolemia. -Hold amlodipine and lisinopril for today  Scrotal swelling Possibly related to acute DVT as suggested on CT scan. Painless. No associated diffuse edema to suggest fluid overload. -Check scrotal ultrasound -Elevation  Degenerative lumbar scoliosis Patient as admitted for management and underwent successful L2-5, L5-S1 ALIF and T10-pelvis instrumentation and fusion.   DVT prophylaxis: Per  primary Code Status: Full Code Family Communication: None at bedside Disposition Plan: From a medical standpoint, will be medically ready once blood  cultures remain negative, fever curve/leukocytosis improves, AKI improved. Otherwise, per primary service.   Cordelia Poche, MD [facility] 08/17/2022, 12:34 PM

## 2022-08-17 NOTE — Plan of Care (Signed)

## 2022-08-17 NOTE — Progress Notes (Signed)
ANTICOAGULATION CONSULT NOTE  Pharmacy Consult:  Heparin Indication: DVT  Allergies  Allergen Reactions   Penicillins Rash    Patient Measurements: Height: '5\' 5"'$  (165.1 cm) Weight: 69.9 kg (154 lb) IBW/kg (Calculated) : 61.5 Heparin Dosing Weight: 69.9 kg  Vital Signs: Temp: 97.5 F (36.4 C) (03/14 1507) Temp Source: Oral (03/14 1507) BP: 113/63 (03/14 1507) Pulse Rate: 85 (03/14 1507)  Labs: Recent Labs    08/16/22 0205 08/16/22 1850 08/17/22 0251 08/17/22 0732 08/17/22 1815 08/17/22 2013  HGB 10.5*  --  10.3* 10.2*  --   --   HCT 31.3*  --  31.7* 30.6*  --   --   PLT 239  --  135* 135*  --   --   HEPARINUNFRC  --    < > 0.41 0.35 <0.10* <0.10*  CREATININE 0.94  --  1.48*  --   --   --    < > = values in this interval not displayed.     Estimated Creatinine Clearance: 41 mL/min (A) (by C-G formula based on SCr of 1.48 mg/dL (H)).   Assessment: 58 YOM admitted for cc of scoliosis admitted for surgical intervention 12 days ago. Currently has an extensive LLE DVT to include the iliac vein requiring anticoagulation. The consult was ordered with notation to give a bolus ok'd by neurology  Heparin level undetectable; inconsistent with trend.  Discussed with RN and was informed there weren't any interruptions nor issues with the infusion.  Stat repeat heparin level remains undetectable.  RN discovered that heparin infusion line was kinked.  Both levels are likely inaccurate since previous levels were therapeutic.  Goal of Therapy:  Heparin level 0.3-0.7 units/ml Monitor platelets by anticoagulation protocol: Yes   Plan:  Continue heparin infusion at 1200 units/hr Recheck level  Ashutosh Dieguez D. Mina Marble, PharmD, BCPS, [place] 08/17/2022, 8:57 PM

## 2022-08-17 NOTE — Progress Notes (Signed)
Inpatient Rehabilitation Admissions Coordinator   Per therapy change in recommendations patient was screened for CIR candidacy by Danne Baxter RN MSN. Current payor trends with [facility] Medicare are unlikley to approve a CIR/AIR level rehab for this diagnosis. I will not place a Rehab Conuslt at this time. Recommend other rehab venues to be pursued if he does not progress as needed for discharge home.  Danne Baxter, RN, MSN Rehab Admissions Coordinator [phone] 08/17/2022 5:00 PM

## 2022-08-17 NOTE — Progress Notes (Signed)
Occupational Therapy Treatment Patient Details Name: John Mcclain MRN: [id number] DOB: [birth year] Today's Date: 08/17/2022   History of present illness Pt is a 67 y.o male admitted 3/1 s/p anterior lumbar interbody fusion L5-S1; anterior lateral interbody fusion L2-5 (part 1 of 2). s/p open thoracolumbar instrumentation, fusion of T10-pelvis, L3-S1 decompression, L2-5 posterior column osteotomies (part 2 of 2). + DVT 3/13 LLE. PMH significant for anxiety, RA, depression, GERD, heart murmur, HTN, Raynaud's.   OT comments  Pt with mild decline in functional independence with new DVT. Pt requiring up to min A for toileting during session and decreased tolerance of functional mobility. Pt requiring up to min guard A with RW in which he does not use AD at baseline. Donning socks this session with sock aid. Pt with scrotal swelling affecting comfort and mobility; OT fabricating scrotal sling during session, and pt mobilizing while donned. Pt educated regarding wearing recommendations, scrotal elevation in absence of sling wear, cleaning sling. RN provided instructions as well and present for fabrication. Planned to update discharge recommendation to AIR to optimize safety at home due to pt motivated and with good support, however, screened for candidacy and denied. Will continue to follow.    Recommendations for follow up therapy are one component of a multi-disciplinary discharge planning process, led by the attending physician.  Recommendations may be updated based on patient status, additional functional criteria and insurance authorization.    Follow Up Recommendations  Home health OT     Assistance Recommended at Discharge Intermittent Supervision/Assistance  Patient can return home with the following  A little help with bathing/dressing/bathroom;Assistance with cooking/housework;Assist for transportation;A little help with walking and/or transfers   Equipment Recommendations  None recommended by  OT    Recommendations for Other Services      Precautions / Restrictions Precautions Precautions: Back Precaution Booklet Issued: Yes (comment) Precaution Comments: reviewed Restrictions Weight Bearing Restrictions: No       Mobility Bed Mobility Overal bed mobility: Needs Assistance Bed Mobility: Rolling, Sidelying to Sit, Sit to Sidelying Rolling: Supervision Sidelying to sit: Supervision     Sit to sidelying: Min guard General bed mobility comments: Supervision to roll towards Rt, using rail, and to rise to EOB, slower and guarded today. Min guard to return back to bed, some difficulty raising LLE into bed but did not require physical assist.    Transfers Overall transfer level: Needs assistance Equipment used: Rolling walker (2 wheels) Transfers: Sit to/from Stand Sit to Stand: Min guard           General transfer comment: Min guard for sit to stand, cues for hand placement with notable difficulty rising due to limited WB tolerance and flexion of Lt hip when rising.     Balance Overall balance assessment: Needs assistance Sitting-balance support: No upper extremity supported, Feet supported Sitting balance-Leahy Scale: Good     Standing balance support: No upper extremity supported Standing balance-Leahy Scale: Fair Standing balance comment: Preference of UE support on RW today.                           ADL either performed or assessed with clinical judgement   ADL Overall ADL's : Needs assistance/impaired                     Lower Body Dressing: Supervision/safety;Sitting/lateral leans;With adaptive equipment Lower Body Dressing Details (indicate cue type and reason): to don sock Toilet Transfer: Min guard;Rolling walker (2  wheels) Toilet Transfer Details (indicate cue type and reason): min guard with use of RW Toileting- Clothing Manipulation and Hygiene: Minimal assistance;Sit to/from stand Toileting - Clothing Manipulation Details  (indicate cue type and reason): Min A to maintain balance while holding urinal for clean catch for nursing staff.     Functional mobility during ADLs: Min guard;Rolling walker (2 wheels) General ADL Comments: Pt with moderate swelling to scrotum and penile area, reporting discomfort, skin irrination at thighs/groin, and difficulty mobilizing. Created scrotal sling to elevate scrotum. Wife present and eager to learn how to assist due to frequent conversations with pt about discomfort. Pt and wife able to don with min guidance from therapist after initial don for fit in which RN Evan present. Left handout in room with cleaning instructions, wear recommendations.    Extremity/Trunk Assessment Upper Extremity Assessment Upper Extremity Assessment: Overall WFL for tasks assessed   Lower Extremity Assessment Lower Extremity Assessment: Defer to PT evaluation        Vision   Vision Assessment?: No apparent visual deficits   Perception     Praxis      Cognition Arousal/Alertness: Awake/alert Behavior During Therapy: WFL for tasks assessed/performed Overall Cognitive Status: Within Functional Limits for tasks assessed                                          Exercises      Shoulder Instructions       General Comments LLE still edematous, elevated. Maintains back precautions. Heparin levels within target    Pertinent Vitals/ Pain       Pain Assessment Pain Assessment: Faces Faces Pain Scale: Hurts little more Pain Location: Spinal surgical site, Lt groin and LLE to a degree Pain Descriptors / Indicators: Aching, Sore, Tightness Pain Intervention(s): Limited activity within patient's tolerance, Monitored during session, Repositioned, Relaxation  Home Living                                          Prior Functioning/Environment              Frequency  Min 2X/week        Progress Toward Goals  OT Goals(current goals can now be  found in the care plan section)  Progress towards OT goals: Not progressing toward goals - comment (Likley back on track, but slight regression with new DVT)  Acute Rehab OT Goals Patient Stated Goal: go to rehab OT Goal Formulation: With patient Time For Goal Achievement: 08/19/22 Potential to Achieve Goals: Good ADL Goals Pt Will Perform Grooming: with modified independence;standing Pt Will Perform Lower Body Dressing: with modified independence;sit to/from stand Pt Will Transfer to Toilet: with modified independence;ambulating;regular height toilet  Plan Frequency remains appropriate;Discharge plan remains appropriate    Co-evaluation                 AM-PAC OT "6 Clicks" Daily Activity     Outcome Measure   Help from another person eating meals?: None Help from another person taking care of personal grooming?: A Little Help from another person toileting, which includes using toliet, bedpan, or urinal?: A Little Help from another person bathing (including washing, rinsing, drying)?: A Little Help from another person to put on and taking off regular upper body clothing?: None Help from  another person to put on and taking off regular lower body clothing?: A Little 6 Click Score: 20    End of Session Equipment Utilized During Treatment: Gait belt;Rolling walker (2 wheels)  OT Visit Diagnosis: Unsteadiness on feet (R26.81);Muscle weakness (generalized) (M62.81);Pain Pain - part of body:  (back, scrotum)   Activity Tolerance Patient tolerated treatment well   Patient Left in bed;with call bell/phone within reach;with bed alarm set   Nurse Communication Mobility status        Time: [id number] OT Time Calculation (min): 53 min  Charges: OT General Charges $OT Visit: 1 Visit OT Treatments $Self Care/Home Management : 53-67 mins  Elder Cyphers, OTR/L [facility] Acute Rehabilitation Office: [phone]   Magnus Ivan 08/17/2022, 5:35 PM

## 2022-08-17 NOTE — Progress Notes (Signed)
Pharmacy Antibiotic Note  John Mcclain is a 67 y.o. male admitted on 08/04/2022 with  leukocytosis .  Pharmacy has been consulted for vanco dosing.  Plan: Vanco 1 gram iv q24h. Scr 10.48 eAUC 461 mcg*hr/mL T 1/2 ~16 hours  Height: '5\' 5"'$  (165.1 cm) Weight: 69.9 kg (154 lb) IBW/kg (Calculated) : 61.5  Temp (24hrs), Avg:98.1 F (36.7 C), Min:97.2 F (36.2 C), Max:98.8 F (37.1 C)  Recent Labs  Lab 08/14/22 1040 08/15/22 0215 08/16/22 0205 08/17/22 0251  WBC 14.0* 15.4* 18.7* 24.0*  CREATININE 0.98  --  0.94 1.48*    Estimated Creatinine Clearance: 41 mL/min (A) (by C-G formula based on SCr of 1.48 mg/dL (H)).    Allergies  Allergen Reactions   Penicillins Rash      Thank you for allowing pharmacy to be a part of this patient's care.  Vaughan Basta BS, PharmD, BCPS Clinical Pharmacist 08/17/2022 8:34 AM  Contact: [phone] after 3 PM  "Be curious, not judgmental..." -Jamal Maes

## 2022-08-17 NOTE — Progress Notes (Signed)
ANTICOAGULATION CONSULT NOTE- follow-up  Pharmacy Consult:  Heparin Indication: DVT  Allergies  Allergen Reactions   Penicillins Rash    Patient Measurements: Height: '5\' 5"'$  (165.1 cm) Weight: 69.9 kg (154 lb) IBW/kg (Calculated) : 61.5 Heparin Dosing Weight: 69.9 kg  Vital Signs: Temp: 98.8 F (37.1 C) (03/14 0758) Temp Source: Oral (03/14 0758) BP: 96/54 (03/14 0758) Pulse Rate: 87 (03/14 0758)  Labs: Recent Labs    08/14/22 1040 08/15/22 0215 08/16/22 0205 08/16/22 1850 08/17/22 0251 08/17/22 0732  HGB 7.2* 9.9* 10.5*  --  10.3*  --   HCT 21.7* 30.0* 31.3*  --  31.7*  --   PLT 359 319 239  --  135*  --   HEPARINUNFRC  --   --   --  0.66 0.41 0.35  CREATININE 0.98  --  0.94  --  1.48*  --      Estimated Creatinine Clearance: 41 mL/min (A) (by C-G formula based on SCr of 1.48 mg/dL (H)).   Assessment: 40 YOM admitted for cc of scoliosis admitted for surgical intervention 12 days ago. Currently has an extensive LLE DVT to include the iliac vein requiring anticoagulation. Patient received a SQ heparin injection 08/16/2022 at 05:22. PTA med list is negative for DOAC or warfarin usage as verified with refill records within the EMR. The consult was ordered with notation to give a bolus ok'd by neurology  3/14 AM update: Heparin level: 0.41 No signs of bleeding or issues with infusion  3/14 AM update (confirmatory level): Heparin level: 0.35 NO signs of bleeding or issues with the infusion noted from nursing  Goal of Therapy:  Heparin level 0.3-0.7 units/ml Monitor platelets by anticoagulation protocol: Yes   Plan:  Although remains in the therapeutic range, given the size and extent of the DVT, I will adjust the dose as follows: Increase heparin to 1200 units/hr Check 6 hr heparin level [14:30]  Chamar Broughton BS, PharmD, BCPS Clinical Pharmacist 08/17/2022 8:36 AM  Contact: [phone] after 3 PM  "Be curious, not judgmental..." -Jamal Maes

## 2022-08-17 NOTE — Progress Notes (Signed)
ANTICOAGULATION CONSULT NOTE- follow-up  Pharmacy Consult:  Heparin Indication: DVT  Allergies  Allergen Reactions   Penicillins Rash    Patient Measurements: Height: '5\' 5"'$  (165.1 cm) Weight: 69.9 kg (154 lb) IBW/kg (Calculated) : 61.5 Heparin Dosing Weight: 69.9 kg  Vital Signs: Temp: 98 F (36.7 C) (03/14 0500) Temp Source: Oral (03/14 0500) BP: 120/57 (03/14 0500) Pulse Rate: 82 (03/14 0500)  Labs: Recent Labs    08/14/22 1040 08/15/22 0215 08/16/22 0205 08/16/22 1850 08/17/22 0251  HGB 7.2* 9.9* 10.5*  --  10.3*  HCT 21.7* 30.0* 31.3*  --  31.7*  PLT 359 319 239  --  135*  HEPARINUNFRC  --   --   --  0.66 0.41  CREATININE 0.98  --  0.94  --  1.48*     Estimated Creatinine Clearance: 41 mL/min (A) (by C-G formula based on SCr of 1.48 mg/dL (H)).   Assessment: 81 YOM admitted for cc of scoliosis admitted for surgical intervention 12 days ago. Currently has an extensive LLE DVT to include the iliac vein requiring anticoagulation. Patient received a SQ heparin injection 08/16/2022 at 05:22. PTA med list is negative for DOAC or warfarin usage as verified with refill records within the EMR. The consult was ordered with notation to give a bolus ok'd by neurology  3/14 AM update: Heparin level: 0.41 No signs of bleeding or issues with infusion  Goal of Therapy:  Heparin level 0.3-0.7 units/ml Monitor platelets by anticoagulation protocol: Yes   Plan:  Continue heparin 1150 units/hr Check 8 hr confirmatory heparin level [08:00]  Briteny Fulghum BS, PharmD, BCPS Clinical Pharmacist 08/17/2022 7:15 AM  Contact: [phone] after 3 PM  "Be curious, not judgmental..." -Jamal Maes

## 2022-08-18 ENCOUNTER — Other Ambulatory Visit (HOSPITAL_COMMUNITY): Payer: Self-pay

## 2022-08-18 ENCOUNTER — Telehealth (HOSPITAL_COMMUNITY): Payer: Self-pay

## 2022-08-18 DIAGNOSIS — N5089 Other specified disorders of the male genital organs: Secondary | ICD-10-CM | POA: Diagnosis not present

## 2022-08-18 DIAGNOSIS — N451 Epididymitis: Secondary | ICD-10-CM

## 2022-08-18 DIAGNOSIS — D72829 Elevated white blood cell count, unspecified: Secondary | ICD-10-CM | POA: Diagnosis not present

## 2022-08-18 LAB — BASIC METABOLIC PANEL
Anion gap: 6 (ref 5–15)
BUN: 9 mg/dL (ref 8–23)
CO2: 24 mmol/L (ref 22–32)
Calcium: 7.6 mg/dL — ABNORMAL LOW (ref 8.9–10.3)
Chloride: 105 mmol/L (ref 98–111)
Creatinine, Ser: 0.98 mg/dL (ref 0.61–1.24)
GFR, Estimated: >60 mL/min (ref >60)
Glucose, Bld: 134 mg/dL — ABNORMAL HIGH (ref 70–99)
Potassium: 3.7 mmol/L (ref 3.5–5.1)
Sodium: 135 mmol/L (ref 135–145)

## 2022-08-18 LAB — CBC
HCT: 27.5 % — ABNORMAL LOW (ref 39.0–52.0)
Hemoglobin: 8.9 g/dL — ABNORMAL LOW (ref 13.0–17.0)
MCH: 30.2 pg (ref 26.0–34.0)
MCHC: 32.4 g/dL (ref 30.0–36.0)
MCV: 93.2 fL (ref 80.0–100.0)
Platelets: 144 10*3/uL — ABNORMAL LOW (ref 150–400)
RBC: 2.95 MIL/uL — ABNORMAL LOW (ref 4.22–5.81)
RDW: 15.2 % (ref 11.5–15.5)
WBC: 15.1 10*3/uL — ABNORMAL HIGH (ref 4.0–10.5)
nRBC: 0 % (ref 0.0–0.2)

## 2022-08-18 LAB — HEPARIN LEVEL (UNFRACTIONATED)
Heparin Unfractionated: <0.1 IU/mL — ABNORMAL LOW (ref 0.30–0.70)
Heparin Unfractionated: 0.17 IU/mL — ABNORMAL LOW (ref 0.30–0.70)

## 2022-08-18 MED ORDER — LEVOFLOXACIN 500 MG PO TABS
500.0000 mg | ORAL_TABLET | Freq: Every day | ORAL | Status: DC
  Administered 2022-08-18 – 2022-08-21 (×4): 500 mg via ORAL
  Filled 2022-08-18 (×4): qty 1

## 2022-08-18 MED ORDER — HEPARIN BOLUS VIA INFUSION
2000.0000 [IU] | Freq: Once | INTRAVENOUS | Status: AC
  Administered 2022-08-18: 2000 [IU] via INTRAVENOUS
  Filled 2022-08-18: qty 2000

## 2022-08-18 MED ORDER — APIXABAN 5 MG PO TABS
10.0000 mg | ORAL_TABLET | Freq: Two times a day (BID) | ORAL | Status: DC
  Administered 2022-08-18 – 2022-08-21 (×7): 10 mg via ORAL
  Filled 2022-08-18 (×7): qty 2

## 2022-08-18 MED ORDER — APIXABAN 5 MG PO TABS: 5.0000 mg | ORAL_TABLET | Freq: Two times a day (BID) | ORAL | Status: DC

## 2022-08-18 NOTE — Progress Notes (Addendum)
Progress Note    08/18/2022 7:49 AM 10 Days Post-Op  Subjective:  feels like left leg is getting better. Says he was able to bear some weight yesterday on his left leg. Feels also that swelling is improving    Vitals:   08/17/22 2115 08/18/22 0406  BP: (!) 121/55 (!) 104/57  Pulse: 85 84  Resp:  18  Temp: 98.3 F (36.8 C) 98.6 F (37 C)  SpO2: 100% 96%   Physical Exam: Cardiac:  regular Lungs:  non labored Incisions:  abd incision is c/d/i Extremities:  well perfused and warm with palpable DP. Left leg edematous Abdomen:  soft Neurologic: alert and oriented  CBC    Component Value Date/Time   WBC 15.1 (H) 08/18/2022 0431   RBC 2.95 (L) 08/18/2022 0431   HGB 8.9 (L) 08/18/2022 0431   HGB 16.4 11/12/2018 1636   HCT 27.5 (L) 08/18/2022 0431   HCT 48.3 11/12/2018 1636   PLT 144 (L) 08/18/2022 0431   PLT 290 11/12/2018 1636   MCV 93.2 08/18/2022 0431   MCV 102 (H) 11/12/2018 1636   MCH 30.2 08/18/2022 0431   MCHC 32.4 08/18/2022 0431   RDW 15.2 08/18/2022 0431   RDW 14.4 11/12/2018 1636   LYMPHSABS 1.0 08/17/2022 0732   LYMPHSABS 2.2 11/12/2018 1636   MONOABS 1.8 (H) 08/17/2022 0732   EOSABS 0.2 08/17/2022 0732   EOSABS 0.0 11/12/2018 1636   BASOSABS 0.1 08/17/2022 0732   BASOSABS 0.0 11/12/2018 1636    BMET    Component Value Date/Time   NA 135 08/18/2022 0431   NA 141 07/13/2020 1613   K 3.7 08/18/2022 0431   CL 105 08/18/2022 0431   CO2 24 08/18/2022 0431   GLUCOSE 134 (H) 08/18/2022 0431   BUN 9 08/18/2022 0431   BUN 10 07/13/2020 1613   CREATININE 0.98 08/18/2022 0431   CREATININE 1.07 11/30/2015 1441   CALCIUM 7.6 (L) 08/18/2022 0431   GFRNONAA >60 08/18/2022 0431   GFRNONAA 74 11/30/2015 1441   GFRAA 80 07/13/2020 1613   GFRAA 86 11/30/2015 1441    INR No results found for: "INR"   Intake/Output Summary (Last 24 hours) at 08/18/2022 0749 Last data filed at 08/17/2022 1700 Gross per 24 hour  Intake 360 ml  Output 800 ml  Net -440 ml      Assessment/Plan:  67 y.o. male is s/p ALIF with new LLE DVT  10 Days Post-Op   Left leg well perfused and warm with palpable DP. No signs of phlegmasia  Feels left leg is slowly improving. Improved pain and able to do some weight bearing on it yesterday Swelling is also improving. Continue ACE wraps and elevation Will order thigh high compression stocking which he can transition to Continue current conservative therapy at this time Currently on IV heparin Plan to transition to po Anticoagulation  DVT prophylaxis:  IV heparin   Karoline Caldwell, PA-C [facility] [phone] 08/18/2022 7:49 AM  I have seen and evaluated the patient. I agree with the PA note as documented above.  Postop day 14 status post anterior spine exposure for L5-S1 ALIF.  Several days ago developed extensive left leg swelling and found to have extensive DVT with iliac vein involvement.  Has gotten better on heparin with Ace wraps and elevation of the leg.  Can transition to [place] from my standpoint either Xarelto or Eliquis.  Discussed that we have 2 to 3 weeks for percutaneous thrombectomy if he does not improve  but he will be higher risk for intervention given his recent retroperitoneal spine exposure so would like to see if he gets better with conservative management alone.  Marty Heck, MD [facility] [place] Office: [phone]

## 2022-08-18 NOTE — Discharge Instructions (Addendum)
Information on my medicine - ELIQUIS (apixaban)  This medication education was reviewed with me or my healthcare representative as part of my discharge preparation.   Why was Eliquis prescribed for you? Eliquis was prescribed to treat blood clots that may have been found in the veins of your legs (deep vein thrombosis) or in your lungs (pulmonary embolism) and to reduce the risk of them occurring again.  What do You need to know about Eliquis ? The starting dose is 10 mg (two 5 mg tablets) taken TWICE daily for the FIRST SEVEN (7) DAYS, then on   08/25/2022 AM  the dose is reduced to ONE 5 mg tablet taken TWICE daily.  Eliquis may be taken with or without food.   Try to take the dose about the same time in the morning and in the evening. If you have difficulty swallowing the tablet whole please discuss with your pharmacist how to take the medication safely.  Take Eliquis exactly as prescribed and DO NOT stop taking Eliquis without talking to the doctor who prescribed the medication.  Stopping may increase your risk of developing a new blood clot.  Refill your prescription before you run out.  After discharge, you should have regular check-up appointments with your healthcare provider that is prescribing your Eliquis.    What do you do if you miss a dose? If a dose of ELIQUIS is not taken at the scheduled time, take it as soon as possible on the same day and twice-daily administration should be resumed. The dose should not be doubled to make up for a missed dose.  Important Safety Information A possible side effect of Eliquis is bleeding. You should call your healthcare provider right away if you experience any of the following: Bleeding from an injury or your nose that does not stop. Unusual colored urine (red or dark brown) or unusual colored stools (red or black). Unusual bruising for unknown reasons. A serious fall or if you hit your head (even if there is no bleeding).  Some  medicines may interact with Eliquis and might increase your risk of bleeding or clotting while on Eliquis. To help avoid this, consult your healthcare provider or pharmacist prior to using any new prescription or non-prescription medications, including herbals, vitamins, non-steroidal anti-inflammatory drugs (NSAIDs) and supplements.  This website has more information on Eliquis (apixaban): http://[email or web address].com/eliquis/home

## 2022-08-18 NOTE — Progress Notes (Signed)
ANTICOAGULATION CONSULT NOTE - Follow Up Consult  Pharmacy Consult for heparin transition to apixaban Indication: DVT  Labs: Recent Labs    08/16/22 0205 08/16/22 1850 08/17/22 0251 08/17/22 0732 08/17/22 1815 08/17/22 2013 08/18/22 0431 08/18/22 1128  HGB 10.5*  --  10.3* 10.2*  --   --  8.9*  --   HCT 31.3*  --  31.7* 30.6*  --   --  27.5*  --   PLT 239  --  135* 135*  --   --  144*  --   HEPARINUNFRC  --    < > 0.41 0.35   < > <0.10* <0.10* 0.17*  CREATININE 0.94  --  1.48*  --   --   --  0.98  --    < > = values in this interval not displayed.     Assessment: 67yo male subtherapeutic on heparin; no signs of bleeding per RN though he notes that the IV pump channel has had some problems, pt calls RN immediately after the pump starts beeping and there has therefore been no prolonged interruptions; suspect that initial bolus may have caused higher heparin level initially as they trended down quickly after that.  3/15 AM update: HL 0.17 No signs of bleeding Line paused on 2 separate occasions for a negligible period of time    Plan:  Heparin 2000 unit bolus f/b  Heparin at 1600 units/hr HL in 8 hours f/b daily CBC daily Ortho and neurosurgery are ok with transitioning to a DOAC Awaiting a determination from provider on which DOAC and have not received a consult to transition.   Thank you  Vaughan Basta BS, PharmD, BCPS Clinical Pharmacist 08/18/2022 12:25 PM  Contact: [phone] after 3 PM  "Be curious, not judgmental..." -Jamal Maes

## 2022-08-18 NOTE — Progress Notes (Signed)
Physical Therapy Treatment Patient Details Name: John Mcclain MRN: [id number] DOB: 1956/02/28 Today's Date: 08/18/2022   History of Present Illness Pt is a 67 y.o male admitted 3/1 s/p anterior lumbar interbody fusion L5-S1; anterior lateral interbody fusion L2-5 (part 1 of 2). s/p open thoracolumbar instrumentation, fusion of T10-pelvis, L3-S1 decompression, L2-5 posterior column osteotomies (part 2 of 2). + DVT 3/13 LLE. PMH significant for anxiety, RA, depression, GERD, heart murmur, HTN, Raynaud's.    PT Comments    Tolerated well today, increased ambulatory distance to 150 feet again, lightly using RW for support. Continue to encourage OOB mobility with staff multiple times per day. Continues to use IS, gentle LE exercises for preservation of muscle mass. Patient will continue to benefit from skilled physical therapy services to further improve independence with functional mobility.    Recommendations for follow up therapy are one component of a multi-disciplinary discharge planning process, led by the attending physician.  Recommendations may be updated based on patient status, additional functional criteria and insurance authorization.  Follow Up Recommendations  Home health PT     Assistance Recommended at Discharge Intermittent Supervision/Assistance  Patient can return home with the following A little help with bathing/dressing/bathroom;Assistance with cooking/housework;Help with stairs or ramp for entrance;Assist for transportation;A little help with walking and/or transfers   Equipment Recommendations  None recommended by PT    Recommendations for Other Services       Precautions / Restrictions Precautions Precautions: Back Precaution Booklet Issued: Yes (comment) Precaution Comments: reviewed Restrictions Weight Bearing Restrictions: No     Mobility  Bed Mobility Overal bed mobility: Needs Assistance Bed Mobility: Rolling, Sidelying to Sit, Sit to  Sidelying Rolling: Supervision Sidelying to sit: Supervision     Sit to sidelying: Supervision General bed mobility comments: Supervision for safety, good control, using rail no assist needed.    Transfers Overall transfer level: Needs assistance Equipment used: Rolling walker (2 wheels) Transfers: Sit to/from Stand Sit to Stand: Supervision           General transfer comment: Supervision for safety, cues for technique. slow to rise but without assistance. performed from bed.    Ambulation/Gait Ambulation/Gait assistance: Supervision Gait Distance (Feet): 150 Feet Assistive device: Rolling walker (2 wheels) Gait Pattern/deviations: Step-through pattern, Trunk flexed, Antalgic Gait velocity: decr Gait velocity interpretation: <1.8 ft/sec, indicate of risk for recurrent falls   General Gait Details: Improved tolerance with gait today. Moderately antalgic with decreased stance time on LLE. Cues for safety and RW use for support, upright posture. No buckling noted. Safely navigating hallway without need for physical assist.   Stairs             Wheelchair Mobility    Modified Rankin (Stroke Patients Only)       Balance Overall balance assessment: Needs assistance Sitting-balance support: No upper extremity supported, Feet supported Sitting balance-Leahy Scale: Good Sitting balance - Comments: Mostly due to guarding/pain   Standing balance support: No upper extremity supported Standing balance-Leahy Scale: Fair Standing balance comment: Preference of UE support on RW today.                            Cognition Arousal/Alertness: Awake/alert Behavior During Therapy: WFL for tasks assessed/performed Overall Cognitive Status: Within Functional Limits for tasks assessed  Exercises General Exercises - Lower Extremity Ankle Circles/Pumps: AROM, Both, 10 reps, Supine Quad Sets: Strengthening,  Both, 10 reps, Supine Gluteal Sets: Strengthening, Both, 10 reps, Seated Hip ABduction/ADduction: Strengthening, 10 reps, Seated, Right Hip Flexion/Marching: Strengthening, Right, 5 reps, Supine    General Comments General comments (skin integrity, edema, etc.): LLE seems smaller, ace bandage in place. transitioned from heparin to eliquis per notes      Pertinent Vitals/Pain Pain Assessment Pain Assessment: Faces Faces Pain Scale: Hurts little more Pain Location: Spinal surgical site, Lt groin and LLE to a degree Pain Descriptors / Indicators: Aching, Sore, Tightness Pain Intervention(s): Monitored during session, Repositioned    Home Living                          Prior Function            PT Goals (current goals can now be found in the care plan section) Acute Rehab PT Goals Patient Stated Goal: Get well PT Goal Formulation: With patient Time For Goal Achievement: 08/19/22 Potential to Achieve Goals: Good Progress towards PT goals: Progressing toward goals    Frequency    Min 4X/week      PT Plan Discharge plan needs to be updated    Co-evaluation              AM-PAC PT "6 Clicks" Mobility   Outcome Measure  Help needed turning from your back to your side while in a flat bed without using bedrails?: A Little Help needed moving from lying on your back to sitting on the side of a flat bed without using bedrails?: A Little Help needed moving to and from a bed to a chair (including a wheelchair)?: A Little Help needed standing up from a chair using your arms (e.g., wheelchair or bedside chair)?: A Little Help needed to walk in hospital room?: A Little Help needed climbing 3-5 steps with a railing? : A Little 6 Click Score: 18    End of Session Equipment Utilized During Treatment: Gait belt Activity Tolerance: Patient tolerated treatment well Patient left: with call bell/phone within reach;in bed;with SCD's reapplied (SCD to RLE only) Nurse  Communication: Mobility status PT Visit Diagnosis: Unsteadiness on feet (R26.81);Other abnormalities of gait and mobility (R26.89);Muscle weakness (generalized) (M62.81);Difficulty in walking, not elsewhere classified (R26.2);Pain Pain - Right/Left: Right Pain - part of body: Hip     Time: [id number] PT Time Calculation (min) (ACUTE ONLY): 16 min  Charges:  $Gait Training: 8-22 mins                     Candie Mile, PT, DPT Physical Therapist [facility] [place]    Ellouise Newer 08/18/2022, 2:42 PM

## 2022-08-18 NOTE — Progress Notes (Signed)
ANTICOAGULATION CONSULT NOTE - Follow Up Consult  Pharmacy Consult for heparin Indication: DVT  Labs: Recent Labs    08/16/22 0205 08/16/22 1850 08/17/22 0251 08/17/22 0732 08/17/22 1815 08/17/22 2013 08/18/22 0431  HGB 10.5*  --  10.3* 10.2*  --   --  8.9*  HCT 31.3*  --  31.7* 30.6*  --   --  27.5*  PLT 239  --  135* 135*  --   --  144*  HEPARINUNFRC  --    < > 0.41 0.35 <0.10* <0.10* <0.10*  CREATININE 0.94  --  1.48*  --   --   --  0.98   < > = values in this interval not displayed.    Assessment: 67yo male subtherapeutic on heparin; no signs of bleeding per RN though he notes that the IV pump channel has had some problems, pt calls RN immediately after the pump starts beeping and there has therefore been no prolonged interruptions; suspect that initial bolus may have caused higher heparin level initially as they trended down quickly after that.  Goal of Therapy:  Heparin level 0.3-0.7 units/ml   Plan:  Will rebolus with hepain 2000 units x1 and increase heparin infusion by 3 units/kg/hr to 1400 units/hr and check level in 6 hours.    Wynona Neat, PharmD, BCPS  08/18/2022,5:22 AM

## 2022-08-18 NOTE — TOC Benefit Eligibility Note (Signed)
Patient Teacher, English as a foreign language completed.    The patient is currently admitted and upon discharge could be taking Eliquis.  The current 30 day co-pay is $11.20.   The patient is insured through [facility]

## 2022-08-18 NOTE — Progress Notes (Signed)
PROGRESS NOTE    John Mcclain  [id number] DOB: 23-Dec-1955 DOA: 08/04/2022 PCP: Horald Pollen, MD   Assessment and Plan:  Leukocytosis Unclear etiology. Possibly related to extensive DVT of left lower extremity. Associated elevated temperature without fever. Patient without acute symptoms to suggest infection source. Per neurosurgery, surgical site does not appear infected. No antibiotics initiated recently for treatment. Blood cultures (3/13) are no growth to date. Procalcitonin is low at 0.37. Leukocytosis improving without initiation of antibiotics.   AKI Baseline creatinine of 1. Creatinine up to 1.48. Presumed secondary to dehydration. IV fluids already started. Complicated by lisinopril use. Urinalysis significant for high specific gravity, indicating possible dehydration. Resolved with IV fluids. -Continue to hold lisinopril for now -Can discontinue IV fluids   Primary hypertension Blood pressures are low-normal. Patient likely with hypovolemia. Blood pressure are stable. -Hold amlodipine and lisinopril for today   Scrotal swelling Possibly related to acute DVT as suggested on CT scan. Painless. No associated diffuse edema to suggest fluid overload. Scrotal ultrasound suggests epididymitis. -Elevation  Epididymitis Unclear if this is infection. Patient is low-risk for STI. -Treat empirically with Levaquin 500 mg x5 days   Degenerative lumbar scoliosis Patient as admitted for management and underwent successful L2-5, L5-S1 ALIF and T10-pelvis instrumentation and fusion.  Discharge recommendations: Levaquin 500 mg daily to complete a total of 5 days of antibiotics, including what was received during admission Anticoagulation per vascular surgery recommendations Continue to hold home lisinopril and amlodipine and recommend PCP follow-up in addition to daily BP checks with a home BP arm cuff by patient   DVT prophylaxis: Per primary Code Status:   Code Status:  Full Code Family Communication: Wife on telephone Disposition Plan: Per primary. PT recommending acute inpatient rehab, however patient states he will be discharging home.   Antimicrobials: Vancomycin Levaquin    Subjective: Patient reports improvement in swelling. He reports some discomfort of his scrotum.  Objective: BP (!) 104/57 (BP Location: Left Arm)   Pulse 84   Temp 98.6 F (37 C) (Oral)   Resp 18   Ht 5\' 5"  (1.651 m)   Wt 69.9 kg   SpO2 96%   BMI 25.63 kg/m   Examination:  General exam: Appears calm and comfortable  Respiratory system: Clear to auscultation.  Gastrointestinal system: Abdomen is non-distended Central nervous system: Alert and oriented. No focal neurological deficits. Musculoskeletal: LLE edema with ACE bandage wrapping applied Psychiatry: Judgement and insight appear normal. Mood & affect appropriate.    Data Reviewed: I have personally reviewed following labs and imaging studies  CBC Lab Results  Component Value Date   WBC 15.1 (H) 08/18/2022   RBC 2.95 (L) 08/18/2022   HGB 8.9 (L) 08/18/2022   HCT 27.5 (L) 08/18/2022   MCV 93.2 08/18/2022   MCH 30.2 08/18/2022   PLT 144 (L) 08/18/2022   MCHC 32.4 08/18/2022   RDW 15.2 08/18/2022   LYMPHSABS 1.0 08/17/2022   MONOABS 1.8 (H) 08/17/2022   EOSABS 0.2 08/17/2022   BASOSABS 0.1 99991111     Last metabolic panel Lab Results  Component Value Date   NA 135 08/18/2022   K 3.7 08/18/2022   CL 105 08/18/2022   CO2 24 08/18/2022   BUN 9 08/18/2022   CREATININE 0.98 08/18/2022   GLUCOSE 134 (H) 08/18/2022   GFRNONAA >60 08/18/2022   GFRAA 80 07/13/2020   CALCIUM 7.6 (L) 08/18/2022   PHOS 2.8 05/07/2017   PROT 6.9 01/11/2021   ALBUMIN 4.3 01/11/2021  LABGLOB 1.9 07/13/2020   AGRATIO 2.3 (H) 07/13/2020   BILITOT 0.5 01/11/2021   ALKPHOS 26 (L) 01/11/2021   AST 24 01/11/2021   ALT 18 01/11/2021   ANIONGAP 6 08/18/2022    GFR: Estimated Creatinine Clearance: 61.9 mL/min (by  C-G formula based on SCr of 0.98 mg/dL).  Recent Results (from the past 240 hour(s))  Culture, blood (Routine X 2) w Reflex to ID Panel     Status: None (Preliminary result)   Collection Time: 08/16/22  8:43 AM   Specimen: BLOOD  Result Value Ref Range Status   Specimen Description BLOOD RIGHT ANTECUBITAL  Final   Special Requests   Final    BOTTLES DRAWN AEROBIC AND ANAEROBIC Blood Culture adequate volume   Culture   Final    NO GROWTH 2 DAYS Performed at [facility], [address], [place], [place] [address]    Report Status PENDING  Incomplete  Culture, blood (Routine X 2) w Reflex to ID Panel     Status: None (Preliminary result)   Collection Time: 08/16/22  8:44 AM   Specimen: BLOOD LEFT ARM  Result Value Ref Range Status   Specimen Description BLOOD LEFT ARM  Final   Special Requests   Final    BOTTLES DRAWN AEROBIC AND ANAEROBIC Blood Culture adequate volume   Culture   Final    NO GROWTH 2 DAYS Performed at [facility], [place] [address], [place], [place] [address]    Report Status PENDING  Incomplete      Radiology Studies: US SCROTUM  Result Date: 08/17/2022 CLINICAL DATA:  Scrotal swelling. EXAM: SCROTAL ULTRASOUND DOPPLER ULTRASOUND OF THE TESTICLES TECHNIQUE: Complete ultrasound examination of the testicles, epididymis, and other scrotal structures was performed. Color and spectral Doppler ultrasound were also utilized to evaluate blood flow to the testicles. COMPARISON:  None Available. FINDINGS: Right testicle Measurements: 2.0 x 1.8 x 2.0 cm. No mass or microlithiasis visualized. Left testicle Measurements: 2.4 x 1.9 x 1.8 cm. No mass or microlithiasis visualized. Right epididymis: Normal size. Increased color flow vascularity throughout the right epididymis. Left epididymis: Incidental note of a 5 mm left epididymal head simple cyst. There is increased color flow vascularity throughout the left epididymis. Hydrocele:  Mild bilateral hydroceles.  Varicocele:  None visualized. Pulsed Doppler interrogation of both testes demonstrates normal low resistance arterial and venous waveforms bilaterally. There is moderate edema and thickening within the bilateral scrotal walls. IMPRESSION: 1. Increased color flow vascularity throughout the bilateral epididymides, suggestive of bilateral epididymitis. 2. Mild bilateral hydroceles. 3. Moderate edema and thickening within the bilateral scrotal walls. 4. No evidence of testicular torsion. Electronically Signed   By: Yvonne Kendall M.D.   On: 08/17/2022 13:04      LOS: 14 days    Cordelia Poche, MD [facility] 08/18/2022, 2:18 PM   If 7PM-7AM, please contact night-coverage [email or web address]

## 2022-08-18 NOTE — Progress Notes (Signed)
   Providing Compassionate, Quality Care - Together  NEUROSURGERY PROGRESS NOTE   S: No issues overnight. Improved LLE swelling  O: EXAM:  BP (!) 104/57 (BP Location: Left Arm)   Pulse 84   Temp 98.6 F (37 C) (Oral)   Resp 18   Ht 5\' 5"  (1.651 m)   Wt 69.9 kg   SpO2 96%   BMI 25.63 kg/m   Awake, alert, oriented x3 PERRL Speech fluent, appropriate  CNs grossly intact  5/5 BUE/BLE  LLE wrapped, swelling significantly improved  ASSESSMENT:  67 y.o. male with   Degenerative lumbar scoliosis, with sagittal and coronal imbalance LLE DVT    -Status post L2-5 LLIF, L5-S1 ALIF; T10-pelvis instrumentation and fusion   PLAN: -cont anticoag, ok to transition to oral -apprec TRH management of AKI, improved -WBC trending down      Thank you for allowing me to participate in this patient's care.  Please do not hesitate to call with questions or concerns.   Elwin Sleight, [facility] Cell: [phone]

## 2022-08-18 NOTE — Telephone Encounter (Signed)
Pharmacy Patient Advocate Encounter  Insurance verification completed.    The patient is insured through [employer]   The patient is currently admitted and ran test claims for the following: Eliquis.  Copays and coinsurance results were relayed to Inpatient clinical team.       

## 2022-08-19 DIAGNOSIS — N5089 Other specified disorders of the male genital organs: Secondary | ICD-10-CM | POA: Diagnosis not present

## 2022-08-19 DIAGNOSIS — N451 Epididymitis: Secondary | ICD-10-CM | POA: Diagnosis not present

## 2022-08-19 DIAGNOSIS — D72829 Elevated white blood cell count, unspecified: Secondary | ICD-10-CM | POA: Diagnosis not present

## 2022-08-19 LAB — CBC
HCT: 27.4 % — ABNORMAL LOW (ref 39.0–52.0)
Hemoglobin: 8.7 g/dL — ABNORMAL LOW (ref 13.0–17.0)
MCH: 29.9 pg (ref 26.0–34.0)
MCHC: 31.8 g/dL (ref 30.0–36.0)
MCV: 94.2 fL (ref 80.0–100.0)
Platelets: 174 10*3/uL (ref 150–400)
RBC: 2.91 MIL/uL — ABNORMAL LOW (ref 4.22–5.81)
RDW: 15.1 % (ref 11.5–15.5)
WBC: 11.2 10*3/uL — ABNORMAL HIGH (ref 4.0–10.5)
nRBC: 0 % (ref 0.0–0.2)

## 2022-08-19 MED ORDER — AMLODIPINE BESYLATE 2.5 MG PO TABS
2.5000 mg | ORAL_TABLET | Freq: Every day | ORAL | Status: DC
  Administered 2022-08-19 – 2022-08-21 (×2): 2.5 mg via ORAL
  Filled 2022-08-19 (×2): qty 1

## 2022-08-19 NOTE — Progress Notes (Signed)
Physical Therapy Treatment Patient Details Name: John Mcclain MRN: [id number] DOB: 1955-07-02 Today's Date: 08/19/2022   History of Present Illness Pt is a 67 y.o male admitted 3/1 s/p anterior lumbar interbody fusion L5-S1; anterior lateral interbody fusion L2-5 (part 1 of 2). s/p open thoracolumbar instrumentation, fusion of T10-pelvis, L3-S1 decompression, L2-5 posterior column osteotomies (part 2 of 2). + DVT 3/13 LLE. PMH significant for anxiety, RA, depression, GERD, heart murmur, HTN, Raynaud's.    PT Comments    Further progress noted today, ambulating increased distances at a supervision level, focusing on symmetry of gait, proper AD use, and upright posture. Still with some mild antalgic patterning and trunk flexion but does improve with cues. Reviewed log roll technique, nearly Mod I with bed mobility transitions while maintaining back precautions. Stands without physical assistance. Reviewed gentle LE and core exercises. Encouraged frequent OOB mobility with staff. Patient will continue to benefit from skilled physical therapy services to further improve independence with functional mobility.    Recommendations for follow up therapy are one component of a multi-disciplinary discharge planning process, led by the attending physician.  Recommendations may be updated based on patient status, additional functional criteria and insurance authorization.  Follow Up Recommendations  Home health PT     Assistance Recommended at Discharge Intermittent Supervision/Assistance  Patient can return home with the following A little help with bathing/dressing/bathroom;Assistance with cooking/housework;Help with stairs or ramp for entrance;Assist for transportation;A little help with walking and/or transfers   Equipment Recommendations  None recommended by PT    Recommendations for Other Services       Precautions / Restrictions Precautions Precautions: Back Precaution Booklet Issued: Yes  (comment) Precaution Comments: reviewed Restrictions Weight Bearing Restrictions: No     Mobility  Bed Mobility Overal bed mobility: Needs Assistance Bed Mobility: Rolling, Sidelying to Sit, Sit to Sidelying Rolling: Supervision Sidelying to sit: Supervision     Sit to sidelying: Supervision General bed mobility comments: Supervision for safety, good control, using rail no assist needed. Cues for log roll to return to supine in bed from sidelying position.    Transfers Overall transfer level: Modified independent Equipment used: Rolling walker (2 wheels)               General transfer comment: Slow to rise but without LOB while using RW for light support today. performed from low bed setting.    Ambulation/Gait Ambulation/Gait assistance: Supervision Gait Distance (Feet): 225 Feet Assistive device: Rolling walker (2 wheels) Gait Pattern/deviations: Step-through pattern, Trunk flexed, Antalgic Gait velocity: decr Gait velocity interpretation: <1.8 ft/sec, indicate of risk for recurrent falls   General Gait Details: Mildly antalgic gait pattern, focusing on symmetry of gait with training today and upright posture as tolerated. Able to improve with cues, fair use of RW for support/unloading of LLE but tolerating majority of weight without issue. Volitionally with increased distance today.   Stairs         General stair comments: Declined   Wheelchair Mobility    Modified Rankin (Stroke Patients Only)       Balance Overall balance assessment: Needs assistance Sitting-balance support: No upper extremity supported, Feet supported Sitting balance-Leahy Scale: Good Sitting balance - Comments: Mostly due to guarding/pain   Standing balance support: No upper extremity supported Standing balance-Leahy Scale: Fair Standing balance comment: Preference of UE support on RW today.  Cognition Arousal/Alertness:  Awake/alert Behavior During Therapy: WFL for tasks assessed/performed Overall Cognitive Status: Within Functional Limits for tasks assessed                                          Exercises General Exercises - Lower Extremity Ankle Circles/Pumps: AROM, Both, 10 reps, Supine Quad Sets: Strengthening, Both, 10 reps, Supine Gluteal Sets: Strengthening, Both, 10 reps, Seated Short Arc Quad: Strengthening, Left, 5 reps, Supine Heel Slides: Strengthening, Right, 5 reps, Supine Hip Flexion/Marching: Strengthening, Right, 5 reps, Supine Other Exercises Other Exercises: Initiated gentle core strengthening with TrA bracing in supine.    General Comments        Pertinent Vitals/Pain Pain Assessment Pain Assessment: Faces Faces Pain Scale: Hurts little more Pain Location: Spinal surgical site, Lt groin and LLE to a degree Pain Descriptors / Indicators: Aching, Sore, Tightness Pain Intervention(s): Monitored during session    Home Living                          Prior Function            PT Goals (current goals can now be found in the care plan section) Acute Rehab PT Goals Patient Stated Goal: Get well PT Goal Formulation: With patient Time For Goal Achievement: 08/26/22 Potential to Achieve Goals: Good Progress towards PT goals: Progressing toward goals    Frequency    Min 4X/week      PT Plan Current plan remains appropriate    Co-evaluation              AM-PAC PT "6 Clicks" Mobility   Outcome Measure  Help needed turning from your back to your side while in a flat bed without using bedrails?: A Little Help needed moving from lying on your back to sitting on the side of a flat bed without using bedrails?: A Little Help needed moving to and from a bed to a chair (including a wheelchair)?: A Little Help needed standing up from a chair using your arms (e.g., wheelchair or bedside chair)?: None Help needed to walk in hospital room?:  None Help needed climbing 3-5 steps with a railing? : A Little 6 Click Score: 20    End of Session Equipment Utilized During Treatment: Gait belt Activity Tolerance: Patient tolerated treatment well Patient left: with call bell/phone within reach;in bed;with SCD's reapplied (SCD to RLE only) Nurse Communication: Mobility status PT Visit Diagnosis: Unsteadiness on feet (R26.81);Other abnormalities of gait and mobility (R26.89);Muscle weakness (generalized) (M62.81);Difficulty in walking, not elsewhere classified (R26.2);Pain Pain - Right/Left: Right Pain - part of body: Hip     Time: 1040-1101 PT Time Calculation (min) (ACUTE ONLY): 21 min  Charges:  $Gait Training: 8-22 mins                     Candie Mile, PT, DPT Physical Therapist Acute Rehabilitation Services [facility]    Ellouise Newer 08/19/2022, 12:01 PM

## 2022-08-19 NOTE — Progress Notes (Signed)
Subjective: Patient reports doing well condition of back pain legs improving swelling is going down left leg  Objective: Vital signs in last 24 hours: Temp:  [98 F (36.7 C)-99.2 F (37.3 C)] 99.2 F (37.3 C) (03/16 0809) Pulse Rate:  [78-86] 78 (03/16 0809) Resp:  [16-18] 17 (03/16 0809) BP: (107-132)/(56-65) 132/65 (03/16 0809) SpO2:  [96 %-100 %] 96 % (03/16 0809)  Intake/Output from previous day: 03/15 0701 - 03/16 0700 In: 555 [P.O.:480; I.V.:75] Out: 1150 [Urine:1150] Intake/Output this shift: No intake/output data recorded.  Strength 5 out of 5   Lab Results: Recent Labs    08/18/22 0431 08/19/22 0211  WBC 15.1* 11.2*  HGB 8.9* 8.7*  HCT 27.5* 27.4*  PLT 144* 174   BMET Recent Labs    08/17/22 0251 08/18/22 0431  NA 130* 135  K 4.7 3.7  CL 97* 105  CO2 23 24  GLUCOSE 140* 134*  BUN 14 9  CREATININE 1.48* 0.98  CALCIUM 8.3* 7.6*    Studies/Results: US SCROTUM  Result Date: 08/17/2022 CLINICAL DATA:  Scrotal swelling. EXAM: SCROTAL ULTRASOUND DOPPLER ULTRASOUND OF THE TESTICLES TECHNIQUE: Complete ultrasound examination of the testicles, epididymis, and other scrotal structures was performed. Color and spectral Doppler ultrasound were also utilized to evaluate blood flow to the testicles. COMPARISON:  None Available. FINDINGS: Right testicle Measurements: 2.0 x 1.8 x 2.0 cm. No mass or microlithiasis visualized. Left testicle Measurements: 2.4 x 1.9 x 1.8 cm. No mass or microlithiasis visualized. Right epididymis: Normal size. Increased color flow vascularity throughout the right epididymis. Left epididymis: Incidental note of a 5 mm left epididymal head simple cyst. There is increased color flow vascularity throughout the left epididymis. Hydrocele:  Mild bilateral hydroceles. Varicocele:  None visualized. Pulsed Doppler interrogation of both testes demonstrates normal low resistance arterial and venous waveforms bilaterally. There is moderate edema and  thickening within the bilateral scrotal walls. IMPRESSION: 1. Increased color flow vascularity throughout the bilateral epididymides, suggestive of bilateral epididymitis. 2. Mild bilateral hydroceles. 3. Moderate edema and thickening within the bilateral scrotal walls. 4. No evidence of testicular torsion. Electronically Signed   By: Yvonne Kendall M.D.   On: 08/17/2022 13:04    Assessment/Plan: Postop lumbar fusion with postop DVT doing fairly well on heparin mobilizing.  LOS: 15 days     Elaina Hoops 08/19/2022, 9:07 AM

## 2022-08-19 NOTE — Progress Notes (Signed)
PROGRESS NOTE    John Mcclain  [id number] DOB: Aug 02, 1955 DOA: 08/04/2022 PCP: Horald Pollen, MD   Assessment and Plan:  Leukocytosis Unclear etiology. Possibly related to extensive DVT of left lower extremity. Associated elevated temperature without fever. Patient without acute symptoms to suggest infection source. Per neurosurgery, surgical site does not appear infected. No antibiotics initiated recently for treatment. Blood cultures (3/13) are no growth to date. Procalcitonin is low at 0.37. Leukocytosis improving without initiation of antibiotics.   AKI Baseline creatinine of 1. Creatinine up to 1.48. Presumed secondary to dehydration. IV fluids already started. Complicated by lisinopril use. Urinalysis significant for high specific gravity, indicating possible dehydration. Resolved with IV fluids. -Continue to hold lisinopril for now -Discontinue IV fluids   Primary hypertension Blood pressures are low-normal. Patient likely with hypovolemia. Blood pressure are stable. -Hold lisinopril for today -Resume amlodipine   Scrotal swelling Possibly related to acute DVT as suggested on CT scan. Painless. No associated diffuse edema to suggest fluid overload. Scrotal ultrasound suggests epididymitis. -Elevation  Epididymitis Unclear if this is infection. Patient is low-risk for STI. -Treat empirically with Levaquin 500 mg x5 days   Degenerative lumbar scoliosis Patient as admitted for management and underwent successful L2-5, L5-S1 ALIF and T10-pelvis instrumentation and fusion.  Discharge recommendations: Levaquin 500 mg daily to complete a total of 5 days of antibiotics, including what was received during admission Anticoagulation per vascular surgery recommendations Resume amlodipine and continue to hold home lisinopril on discharge. Recommend PCP follow-up in addition to daily BP checks with a home BP arm cuff by patient   DVT prophylaxis: Per primary Code Status:    Code Status: Full Code Family Communication: None at bedside Disposition Plan: Per primary   Antimicrobials: Vancomycin Levaquin    Subjective: Continued improvement of LLE and scrotal swelling.  Objective: BP 132/65 (BP Location: Left Arm)   Pulse 78   Temp 99.2 F (37.3 C) (Oral)   Resp 17   Ht 5\' 5"  (1.651 m)   Wt 69.9 kg   SpO2 96%   BMI 25.63 kg/m   Examination:  General exam: Appears calm and comfortable Respiratory system: Clear to auscultation. Respiratory effort normal. Cardiovascular system: S1 & S2 heard, RRR.  Gastrointestinal system: Abdomen is nondistended, soft and nontender. No organomegaly or masses felt. Normal bowel sounds heard. Central nervous system: Alert and oriented. No focal neurological deficits. Musculoskeletal: LLE with compression stockings Psychiatry: Judgement and insight appear normal. Mood & affect appropriate.    Data Reviewed: I have personally reviewed following labs and imaging studies  CBC Lab Results  Component Value Date   WBC 11.2 (H) 08/19/2022   RBC 2.91 (L) 08/19/2022   HGB 8.7 (L) 08/19/2022   HCT 27.4 (L) 08/19/2022   MCV 94.2 08/19/2022   MCH 29.9 08/19/2022   PLT 174 08/19/2022   MCHC 31.8 08/19/2022   RDW 15.1 08/19/2022   LYMPHSABS 1.0 08/17/2022   MONOABS 1.8 (H) 08/17/2022   EOSABS 0.2 08/17/2022   BASOSABS 0.1 99991111     Last metabolic panel Lab Results  Component Value Date   NA 135 08/18/2022   K 3.7 08/18/2022   CL 105 08/18/2022   CO2 24 08/18/2022   BUN 9 08/18/2022   CREATININE 0.98 08/18/2022   GLUCOSE 134 (H) 08/18/2022   GFRNONAA >60 08/18/2022   GFRAA 80 07/13/2020   CALCIUM 7.6 (L) 08/18/2022   PHOS 2.8 05/07/2017   PROT 6.9 01/11/2021   ALBUMIN 4.3 01/11/2021  LABGLOB 1.9 07/13/2020   AGRATIO 2.3 (H) 07/13/2020   BILITOT 0.5 01/11/2021   ALKPHOS 26 (L) 01/11/2021   AST 24 01/11/2021   ALT 18 01/11/2021   ANIONGAP 6 08/18/2022    GFR: Estimated Creatinine  Clearance: 61.9 mL/min (by C-G formula based on SCr of 0.98 mg/dL).  Recent Results (from the past 240 hour(s))  Culture, blood (Routine X 2) w Reflex to ID Panel     Status: None (Preliminary result)   Collection Time: 08/16/22  8:43 AM   Specimen: BLOOD  Result Value Ref Range Status   Specimen Description BLOOD RIGHT ANTECUBITAL  Final   Special Requests   Final    BOTTLES DRAWN AEROBIC AND ANAEROBIC Blood Culture adequate volume   Culture   Final    NO GROWTH 2 DAYS Performed at [facility], [address] [place], [place] [address]    Report Status PENDING  Incomplete  Culture, blood (Routine X 2) w Reflex to ID Panel     Status: None (Preliminary result)   Collection Time: 08/16/22  8:44 AM   Specimen: BLOOD LEFT ARM  Result Value Ref Range Status   Specimen Description BLOOD LEFT ARM  Final   Special Requests   Final    BOTTLES DRAWN AEROBIC AND ANAEROBIC Blood Culture adequate volume   Culture   Final    NO GROWTH 2 DAYS Performed at [facility], [place] [address], [place], [place] [address]    Report Status PENDING  Incomplete      Radiology Studies: US SCROTUM  Result Date: 08/17/2022 CLINICAL DATA:  Scrotal swelling. EXAM: SCROTAL ULTRASOUND DOPPLER ULTRASOUND OF THE TESTICLES TECHNIQUE: Complete ultrasound examination of the testicles, epididymis, and other scrotal structures was performed. Color and spectral Doppler ultrasound were also utilized to evaluate blood flow to the testicles. COMPARISON:  None Available. FINDINGS: Right testicle Measurements: 2.0 x 1.8 x 2.0 cm. No mass or microlithiasis visualized. Left testicle Measurements: 2.4 x 1.9 x 1.8 cm. No mass or microlithiasis visualized. Right epididymis: Normal size. Increased color flow vascularity throughout the right epididymis. Left epididymis: Incidental note of a 5 mm left epididymal head simple cyst. There is increased color flow vascularity throughout the left epididymis. Hydrocele:  Mild  bilateral hydroceles. Varicocele:  None visualized. Pulsed Doppler interrogation of both testes demonstrates normal low resistance arterial and venous waveforms bilaterally. There is moderate edema and thickening within the bilateral scrotal walls. IMPRESSION: 1. Increased color flow vascularity throughout the bilateral epididymides, suggestive of bilateral epididymitis. 2. Mild bilateral hydroceles. 3. Moderate edema and thickening within the bilateral scrotal walls. 4. No evidence of testicular torsion. Electronically Signed   By: Yvonne Kendall M.D.   On: 08/17/2022 13:04      LOS: 15 days    Cordelia Poche, MD [facility] 08/19/2022, 9:34 AM   If 7PM-7AM, please contact night-coverage [email or web address]

## 2022-08-20 DIAGNOSIS — D72829 Elevated white blood cell count, unspecified: Secondary | ICD-10-CM | POA: Diagnosis not present

## 2022-08-20 DIAGNOSIS — N451 Epididymitis: Secondary | ICD-10-CM | POA: Diagnosis not present

## 2022-08-20 DIAGNOSIS — N5089 Other specified disorders of the male genital organs: Secondary | ICD-10-CM | POA: Diagnosis not present

## 2022-08-20 LAB — CBC
HCT: 27.6 % — ABNORMAL LOW (ref 39.0–52.0)
Hemoglobin: 8.9 g/dL — ABNORMAL LOW (ref 13.0–17.0)
MCH: 29.6 pg (ref 26.0–34.0)
MCHC: 32.2 g/dL (ref 30.0–36.0)
MCV: 91.7 fL (ref 80.0–100.0)
Platelets: 232 10*3/uL (ref 150–400)
RBC: 3.01 MIL/uL — ABNORMAL LOW (ref 4.22–5.81)
RDW: 14.9 % (ref 11.5–15.5)
WBC: 9.9 10*3/uL (ref 4.0–10.5)
nRBC: 0 % (ref 0.0–0.2)

## 2022-08-20 NOTE — Progress Notes (Signed)
PROGRESS NOTE    John Mcclain  [id number] DOB: [birth year] DOA: 08/04/2022 PCP: Horald Pollen, MD   Assessment and Plan:  Leukocytosis Unclear etiology. Possibly related to extensive DVT of left lower extremity. Associated elevated temperature without fever. Patient without acute symptoms to suggest infection source. Per neurosurgery, surgical site does not appear infected. No antibiotics initiated recently for treatment. Blood cultures (3/13) are no growth to date. Procalcitonin is low at 0.37. Leukocytosis improving without initiation of antibiotics.   AKI Baseline creatinine of 1. Creatinine up to 1.48. Presumed secondary to dehydration. IV fluids already started. Complicated by lisinopril use. Urinalysis significant for high specific gravity, indicating possible dehydration. Resolved with IV fluids. -Continue to hold lisinopril for now   Primary hypertension Blood pressures are low-normal. Patient likely with hypovolemia. Blood pressure are stable. -Hold lisinopril -Continue amlodipine   Scrotal swelling Possibly related to acute DVT as suggested on CT scan. Painless. No associated diffuse edema to suggest fluid overload. Scrotal ultrasound suggests epididymitis. -Elevation  Epididymitis Unclear if this is infection. Patient is low-risk for STI. -Treat empirically with Levaquin 500 mg x5 days   Degenerative lumbar scoliosis Patient as admitted for management and underwent successful L2-5, L5-S1 ALIF and T10-pelvis instrumentation and fusion.  Discharge recommendations: Levaquin 500 mg daily to complete a total of 5 days of antibiotics, including what was received during admission Anticoagulation per vascular surgery recommendations Resume amlodipine and continue to hold home lisinopril on discharge. Recommend PCP follow-up in addition to daily BP checks with a home BP arm cuff by patient   DVT prophylaxis: Per primary Code Status:   Code Status: Full  Code Family Communication: None at bedside Disposition Plan: Per primary   Antimicrobials: Vancomycin Levaquin    Subjective: Still with some LLE and pelvic pain/discomfort. No other concerns.  Objective: BP 114/62 (BP Location: Left Arm)   Pulse 80   Temp 98.4 F (36.9 C) (Oral)   Resp 16   Ht 5\' 5"  (1.651 m)   Wt 69.9 kg   SpO2 97%   BMI 25.63 kg/m   Examination:  General exam: Appears calm and comfortable Respiratory system: Respiratory effort normal. Central nervous system: Alert and oriented. Musculoskeletal: LLE with compression stockings. No calf tenderness Psychiatry: Judgement and insight appear normal. Mood & affect appropriate.    Data Reviewed: I have personally reviewed following labs and imaging studies  CBC Lab Results  Component Value Date   WBC 9.9 08/20/2022   RBC 3.01 (L) 08/20/2022   HGB 8.9 (L) 08/20/2022   HCT 27.6 (L) 08/20/2022   MCV 91.7 08/20/2022   MCH 29.6 08/20/2022   PLT 232 08/20/2022   MCHC 32.2 08/20/2022   RDW 14.9 08/20/2022   LYMPHSABS 1.0 08/17/2022   MONOABS 1.8 (H) 08/17/2022   EOSABS 0.2 08/17/2022   BASOSABS 0.1 99991111     Last metabolic panel Lab Results  Component Value Date   NA 135 08/18/2022   K 3.7 08/18/2022   CL 105 08/18/2022   CO2 24 08/18/2022   BUN 9 08/18/2022   CREATININE 0.98 08/18/2022   GLUCOSE 134 (H) 08/18/2022   GFRNONAA >60 08/18/2022   GFRAA 80 07/13/2020   CALCIUM 7.6 (L) 08/18/2022   PHOS 2.8 05/07/2017   PROT 6.9 01/11/2021   ALBUMIN 4.3 01/11/2021   LABGLOB 1.9 07/13/2020   AGRATIO 2.3 (H) 07/13/2020   BILITOT 0.5 01/11/2021   ALKPHOS 26 (L) 01/11/2021   AST 24 01/11/2021   ALT 18 01/11/2021  ANIONGAP 6 08/18/2022    GFR: Estimated Creatinine Clearance: 61.9 mL/min (by C-G formula based on SCr of 0.98 mg/dL).  Recent Results (from the past 240 hour(s))  Culture, blood (Routine X 2) w Reflex to ID Panel     Status: None (Preliminary result)   Collection Time:  08/16/22  8:43 AM   Specimen: BLOOD  Result Value Ref Range Status   Specimen Description BLOOD RIGHT ANTECUBITAL  Final   Special Requests   Final    BOTTLES DRAWN AEROBIC AND ANAEROBIC Blood Culture adequate volume   Culture   Final    NO GROWTH 4 DAYS Performed at [facility], [address], [place], [place] [address]    Report Status PENDING  Incomplete  Culture, blood (Routine X 2) w Reflex to ID Panel     Status: None (Preliminary result)   Collection Time: 08/16/22  8:44 AM   Specimen: BLOOD LEFT ARM  Result Value Ref Range Status   Specimen Description BLOOD LEFT ARM  Final   Special Requests   Final    BOTTLES DRAWN AEROBIC AND ANAEROBIC Blood Culture adequate volume   Culture   Final    NO GROWTH 4 DAYS Performed at [facility], [place] [address], [place], [place] [address]    Report Status PENDING  Incomplete      Radiology Studies: No results found.    LOS: 16 days    Cordelia Poche, MD [facility] 08/20/2022, 9:32 AM   If 7PM-7AM, please contact night-coverage [email or web address]

## 2022-08-20 NOTE — Progress Notes (Signed)
NEUROSURGERY PROGRESS NOTE  S/p lumbar fusion. Doing well. Complains of appropriate back soreness. No leg pain No numbness, tingling or weakness Ambulating and voiding well Good strength and sensation Incision CDI Continue to mobilize  Temp:  [98 F (36.7 C)-98.4 F (36.9 C)] 98.4 F (36.9 C) (03/17 0804) Pulse Rate:  [80-88] 80 (03/17 0804) Resp:  [16-18] 16 (03/17 0804) BP: (110-126)/(57-67) 114/62 (03/17 0804) SpO2:  [97 %-100 %] 97 % (03/17 0804)    Eleonore Chiquito, NP 08/20/2022 8:56 AM

## 2022-08-21 ENCOUNTER — Inpatient Hospital Stay: Payer: Medicare PPO | Admitting: Emergency Medicine

## 2022-08-21 DIAGNOSIS — N5089 Other specified disorders of the male genital organs: Secondary | ICD-10-CM | POA: Diagnosis not present

## 2022-08-21 DIAGNOSIS — D72829 Elevated white blood cell count, unspecified: Secondary | ICD-10-CM | POA: Diagnosis not present

## 2022-08-21 DIAGNOSIS — N451 Epididymitis: Secondary | ICD-10-CM | POA: Diagnosis not present

## 2022-08-21 LAB — CULTURE, BLOOD (ROUTINE X 2)
Culture: NO GROWTH
Culture: NO GROWTH
Special Requests: ADEQUATE
Special Requests: ADEQUATE

## 2022-08-21 LAB — CBC
HCT: 29.5 % — ABNORMAL LOW (ref 39.0–52.0)
Hemoglobin: 9.4 g/dL — ABNORMAL LOW (ref 13.0–17.0)
MCH: 29.6 pg (ref 26.0–34.0)
MCHC: 31.9 g/dL (ref 30.0–36.0)
MCV: 92.8 fL (ref 80.0–100.0)
Platelets: 314 10*3/uL (ref 150–400)
RBC: 3.18 MIL/uL — ABNORMAL LOW (ref 4.22–5.81)
RDW: 14.9 % (ref 11.5–15.5)
WBC: 10 10*3/uL (ref 4.0–10.5)
nRBC: 0 % (ref 0.0–0.2)

## 2022-08-21 MED ORDER — LEVOFLOXACIN 500 MG PO TABS: 500.0000 mg | ORAL_TABLET | Freq: Every day | ORAL | 0 refills | Status: AC

## 2022-08-21 MED ORDER — APIXABAN 5 MG PO TABS: 5.0000 mg | ORAL_TABLET | Freq: Two times a day (BID) | ORAL | 1 refills | Status: DC

## 2022-08-21 NOTE — Progress Notes (Signed)
[facility]  Subjective  -left leg swelling much better.   Objective 107/72 90 98.2 F (36.8 C) (Oral) 18 99%  Intake/Output Summary (Last 24 hours) at 08/21/2022 0747 Last data filed at 08/21/2022 0402 Gross per 24 hour  Intake --  Output 1200 ml  Net -1200 ml    Left lower extremity edema significantly improved with thigh-high stocking in place 2+ palpable left pedal pulse  Laboratory Lab Results: Recent Labs    08/20/22 0319 08/21/22 0426  WBC 9.9 10.0  HGB 8.9* 9.4*  HCT 27.6* 29.5*  PLT 232 314   BMET No results for input(s): "NA", "K", "CL", "CO2", "GLUCOSE", "BUN", "CREATININE", "CALCIUM" in the last 72 hours.  COAG No results found for: "INR", "PROTIME" No results found for: "PTT"  Assessment/Planning:  67 year old male underwent anterior spine exposure for L5-S1 ALIF on 08/04/2022.  About 10 days after surgery noted to have significant left leg swelling and edema.  Found to have extensive left leg DVT with iliac vein involvement.  He has seen significant improvement on anticoagulation.  His left leg is almost back to normal circumference.  Looks much better.  Needs to continue elevating his leg with thigh-high stocking.  Tolerating Eliquis.  I will arrange follow-up in 2 weeks in the office to see how he continues to improve clinically.  Still have the option of percutaneous thrombectomy but would want to delay this and again he is getting better with conservative therapy and I think any intervention for him is higher risk.  Marty Heck 08/21/2022 7:47 AM --

## 2022-08-21 NOTE — Progress Notes (Signed)
Mobility Specialist Progress Note   08/21/22 1030  Mobility  Activity Ambulated with assistance in hallway  Level of Assistance Standby assist, set-up cues, supervision of patient - no hands on  Assistive Device Front wheel walker  Distance Ambulated (ft) 120 ft   Patient received dangling EOB and agreeable to participate. Ambulated supervision level with steady gait. Returned to room without complaint or incident. Was left dangling with all needs met, call bell in reach.   [place] Monice Lundy, BS EXP Mobility Specialist Please contact via SecureChat or Rehab office at [phone]

## 2022-08-21 NOTE — Progress Notes (Signed)
PT Cancellation Note  Patient Details Name: John Mcclain MRN: [id number] DOB: 1956/01/31   Cancelled Treatment:    Reason Eval/Treat Not Completed: Patient declined, no reason specified  Politely declines PT, stating wife is on her way and he is d/c upon her arrival. Declines further stair training, verbally reviewed with pt, reports he feels confident. All questions answered. Will follow until d/c.  Candie Mile, PT, DPT Physical Therapist Acute Rehabilitation Services [facility]  08/21/2022, 11:19 AM

## 2022-08-21 NOTE — Progress Notes (Signed)
Occupational Therapy Treatment Patient Details Name: John Mcclain MRN: [id number] DOB: 01-21-1956 Today's Date: 08/21/2022   History of present illness Pt is a 67 y.o male admitted 3/1 s/p anterior lumbar interbody fusion L5-S1; anterior lateral interbody fusion L2-5 (part 1 of 2). s/p open thoracolumbar instrumentation, fusion of T10-pelvis, L3-S1 decompression, L2-5 posterior column osteotomies (part 2 of 2). + DVT 3/13 LLE. PMH significant for anxiety, RA, depression, GERD, heart murmur, HTN, Raynaud's.   OT comments  Completed education with wife present regarding ADL management, including support options for scrotal edema. Also educated on strategies to reduce risk of falls. Pt reports feeling "much better". All further OT to be addressed by [place].    Recommendations for follow up therapy are one component of a multi-disciplinary discharge planning process, led by the attending physician.  Recommendations may be updated based on patient status, additional functional criteria and insurance authorization.    Follow Up Recommendations  Home health OT     Assistance Recommended at Discharge Intermittent Supervision/Assistance  Patient can return home with the following  A little help with bathing/dressing/bathroom;Assistance with cooking/housework;Assist for transportation;A little help with walking and/or transfers   Equipment Recommendations  None recommended by OT    Recommendations for Other Services      Precautions / Restrictions Precautions Precautions: Back Precaution Booklet Issued: Yes (comment) Precaution Comments: reviewed Restrictions Weight Bearing Restrictions: No       Mobility Bed Mobility Overal bed mobility: Needs Assistance Bed Mobility: Rolling, Sidelying to Sit, Sit to Sidelying Rolling: Supervision Sidelying to sit: Supervision     Sit to sidelying: Supervision General bed mobility comments: Supervision for safety, good control, using rail no assist  needed. Cues for log roll to return to supine in bed from sidelying position.    Transfers Overall transfer level: Modified independent Equipment used: Rolling walker (2 wheels)               General transfer comment: Slow to rise but without LOB while using RW for light support today. performed from low bed setting.     Balance Overall balance assessment: Needs assistance Sitting-balance support: No upper extremity supported, Feet supported Sitting balance-Leahy Scale: Good Sitting balance - Comments: Mostly due to guarding/pain   Standing balance support: No upper extremity supported Standing balance-Leahy Scale: Fair Standing balance comment: Preference of UE support on RW today.                           ADL either performed or assessed with clinical judgement   ADL                                       Functional mobility during ADLs: Modified independent;Cueing for safety;Rolling walker (2 wheels) General ADL Comments:  (wife present for education; educated on back precautions for bathing/dressing using figure four technique or wife assisting; pt has AE to use if needed; scrtal edema has improved significantly - discussed options of using supportive boxers/scrotal sling)    Extremity/Trunk Assessment Upper Extremity Assessment Upper Extremity Assessment: Overall WFL for tasks assessed   Lower Extremity Assessment Lower Extremity Assessment: Defer to PT evaluation (improving ROM L hip)        Vision       Perception     Praxis      Cognition Arousal/Alertness: Awake/alert Behavior During Therapy: [facility] for tasks assessed/performed  Overall Cognitive Status: Within Functional Limits for tasks assessed                                          Exercises Other Exercises Other Exercises: Initiated gentle core strengthening with TrA bracing in supine.    Shoulder Instructions       General Comments       Pertinent Vitals/ Pain       Pain Assessment Pain Assessment: Faces Faces Pain Scale: Hurts little more Pain Location: Spinal surgical site, Lt groin and LLE to a degree Pain Descriptors / Indicators: Aching, Sore, Tightness Pain Intervention(s): Limited activity within patient's tolerance  Home Living                                          Prior Functioning/Environment              Frequency  Min 2X/week        Progress Toward Goals  OT Goals(current goals can now be found in the care plan section)  Progress towards OT goals: Progressing toward goals  Acute Rehab OT Goals Patient Stated Goal: home today OT Goal Formulation: With patient  Plan Frequency remains appropriate;Discharge plan remains appropriate    Co-evaluation                 AM-PAC OT "6 Clicks" Daily Activity     Outcome Measure   Help from another person eating meals?: None Help from another person taking care of personal grooming?: A Little Help from another person toileting, which includes using toliet, bedpan, or urinal?: A Little Help from another person bathing (including washing, rinsing, drying)?: A Little Help from another person to put on and taking off regular upper body clothing?: None Help from another person to put on and taking off regular lower body clothing?: A Little 6 Click Score: 20    End of Session Equipment Utilized During Treatment: Gait belt;Rolling walker (2 wheels)  OT Visit Diagnosis: Unsteadiness on feet (R26.81);Muscle weakness (generalized) (M62.81);Pain Pain - Right/Left: Right   Activity Tolerance Patient tolerated treatment well   Patient Left in bed;with call bell/phone within reach;with family/visitor present   Nurse Communication Mobility status        Time: [id number] OT Time Calculation (min): 21 min  Charges: OT General Charges $OT Visit: 1 Visit OT Treatments $Self Care/Home Management : 8-22 mins  Maurie Boettcher, OT/L   Acute OT Clinical Specialist [place] Pager [phone] Office [phone]   [facility] 08/21/2022, 12:16 PM

## 2022-08-21 NOTE — Discharge Summary (Cosign Needed Addendum)
Discharge Summary   Date of Admission: 08/04/2022   Date of Discharge: 08/21/22   Attending Physician: Elwin Sleight, MD   Hospital Course: Patient was admitted following an uncomplicated [id number] ALIF, [id number] XLIF, followed by a PSF T10-pelvis. They were recovered in PACU and transferred to 5N. Their preop symptoms were improved. Patient developed a LLE DVT which was treated conservatively; he is to f/u w/ Vascular team in 2 weeks. Patient also developed AKI and leukocytosis which was followed by hospital medicine team. AKI and leukocytosis are resolving. Pt to f/u w/ PCP in 2 weeks. They will follow up in clinic with me in 2 weeks as well for post op visit. Pt is in agreement w/ plan.    Neurologic exam at discharge:  A&O x3 Strength 5/5 x4.  SILTx4.  Incisions c/d/I.  LLE swelling greatly improved, pt wearing compression stocking.    Discharge diagnosis: Lumbar stenosis with radiculopathy.   Caroline More, [facility]

## 2022-08-21 NOTE — Progress Notes (Signed)
PROGRESS NOTE    John Mcclain  [id number] DOB: Apr 30, 1956 DOA: 08/04/2022 PCP: Horald Pollen, MD   Assessment and Plan:  Leukocytosis Unclear etiology. Possibly related to extensive DVT of left lower extremity. Associated elevated temperature without fever. Patient without acute symptoms to suggest infection source. Per neurosurgery, surgical site does not appear infected. No antibiotics initiated recently for treatment. Blood cultures (3/13) are no growth to date. Procalcitonin is low at 0.37. Leukocytosis improving without initiation of antibiotics.   AKI Baseline creatinine of 1. Creatinine up to 1.48. Presumed secondary to dehydration. IV fluids already started. Complicated by lisinopril use. Urinalysis significant for high specific gravity, indicating possible dehydration. Resolved with IV fluids. -Continue to hold lisinopril   Primary hypertension Blood pressures are low-normal. Patient likely with hypovolemia. Blood pressure are stable. -Hold lisinopril -Continue amlodipine   Scrotal swelling Possibly related to acute DVT as suggested on CT scan. Painless. No associated diffuse edema to suggest fluid overload. Scrotal ultrasound suggests epididymitis. Improved. -Elevation  Epididymitis Unclear if this is infection. Patient is low-risk for STI. -Treat empirically with Levaquin 500 mg x5 days   Degenerative lumbar scoliosis Patient as admitted for management and underwent successful L2-5, L5-S1 ALIF and T10-pelvis instrumentation and fusion.   Discharge recommendations: Levaquin 500 mg daily to complete a total of 5 days of antibiotics, including what was received during admission (last day of treatment is 3/19). Anticoagulation per vascular surgery recommendations Resume amlodipine and continue to hold home lisinopril on discharge. Recommend PCP follow-up in addition to daily BP checks with a home BP arm cuff by patient   DVT prophylaxis: Per primary Code  Status:   Code Status: Full Code Family Communication: None at bedside Disposition Plan: Per primary. No barriers to discharge per medicine service.   Antimicrobials: Vancomycin Levaquin    Subjective: Continued leg improvement. Still with some left pelvis discomfort. Increased urine output. Drinking a lot of fluids.  Objective: BP 107/72 (BP Location: Left Arm)   Pulse 90   Temp 98.2 F (36.8 C) (Oral)   Resp 18   Ht 5\' 5"  (1.651 m)   Wt 69.9 kg   SpO2 99%   BMI 25.63 kg/m   Examination:  General exam: Appears calm and comfortable Respiratory system: Clear to auscultation. Respiratory effort normal. Cardiovascular system: S1 & S2 heard, RRR. No murmurs, rubs, gallops or clicks. Gastrointestinal system: Abdomen is nondistended, soft and nontender. No organomegaly or masses felt. Normal bowel sounds heard. Central nervous system: Alert and oriented. No focal neurological deficits. Musculoskeletal: No significant LLE edema. No calf tenderness Skin: No cyanosis. No rashes Psychiatry: Judgement and insight appear normal. Mood & affect appropriate.    Data Reviewed: I have personally reviewed following labs and imaging studies  CBC Lab Results  Component Value Date   WBC 10.0 08/21/2022   RBC 3.18 (L) 08/21/2022   HGB 9.4 (L) 08/21/2022   HCT 29.5 (L) 08/21/2022   MCV 92.8 08/21/2022   MCH 29.6 08/21/2022   PLT 314 08/21/2022   MCHC 31.9 08/21/2022   RDW 14.9 08/21/2022   LYMPHSABS 1.0 08/17/2022   MONOABS 1.8 (H) 08/17/2022   EOSABS 0.2 08/17/2022   BASOSABS 0.1 99991111     Last metabolic panel Lab Results  Component Value Date   NA 135 08/18/2022   K 3.7 08/18/2022   CL 105 08/18/2022   CO2 24 08/18/2022   BUN 9 08/18/2022   CREATININE 0.98 08/18/2022   GLUCOSE 134 (H) 08/18/2022   GFRNONAA >  60 08/18/2022   GFRAA 80 07/13/2020   CALCIUM 7.6 (L) 08/18/2022   PHOS 2.8 05/07/2017   PROT 6.9 01/11/2021   ALBUMIN 4.3 01/11/2021   LABGLOB 1.9  07/13/2020   AGRATIO 2.3 (H) 07/13/2020   BILITOT 0.5 01/11/2021   ALKPHOS 26 (L) 01/11/2021   AST 24 01/11/2021   ALT 18 01/11/2021   ANIONGAP 6 08/18/2022    GFR: Estimated Creatinine Clearance: 61.9 mL/min (by C-G formula based on SCr of 0.98 mg/dL).  Recent Results (from the past 240 hour(s))  Culture, blood (Routine X 2) w Reflex to ID Panel     Status: None   Collection Time: 08/16/22  8:43 AM   Specimen: BLOOD  Result Value Ref Range Status   Specimen Description BLOOD RIGHT ANTECUBITAL  Final   Special Requests   Final    BOTTLES DRAWN AEROBIC AND ANAEROBIC Blood Culture adequate volume   Culture   Final    NO GROWTH 5 DAYS Performed at [facility], [address] [place], [place] [address]    Report Status 08/21/2022 FINAL  Final  Culture, blood (Routine X 2) w Reflex to ID Panel     Status: None   Collection Time: 08/16/22  8:44 AM   Specimen: BLOOD LEFT ARM  Result Value Ref Range Status   Specimen Description BLOOD LEFT ARM  Final   Special Requests   Final    BOTTLES DRAWN AEROBIC AND ANAEROBIC Blood Culture adequate volume   Culture   Final    NO GROWTH 5 DAYS Performed at [facility], [place] [address] [place], [place] [address]    Report Status 08/21/2022 FINAL  Final      Radiology Studies: No results found.    LOS: 17 days    Cordelia Poche, MD [facility] 08/21/2022, 9:07 AM   If 7PM-7AM, please contact night-coverage [email or web address]

## 2022-08-22 ENCOUNTER — Telehealth: Payer: Self-pay | Admitting: *Deleted

## 2022-08-22 ENCOUNTER — Encounter: Payer: Self-pay | Admitting: *Deleted

## 2022-08-22 NOTE — Transitions of Care (Post Inpatient/ED Visit) (Signed)
08/22/2022  Name: John Mcclain MRN: [id number] DOB: 08/04/1955  Today's TOC FU Call Status: Today's TOC FU Call Status:: Successful TOC FU Call Competed TOC FU Call Complete Date: 08/22/22  Transition Care Management Follow-up Telephone Call Date of Discharge: 08/21/22 Discharge Facility: [facility]) Type of Discharge: Inpatient Admission Primary Inpatient Discharge Diagnosis:: surgery-- Anterior lumbar interbody fusion; post- op complications: hypotension/ DVT; extended hospitalization 3/01-18/2024 How have you been since you were released from the hospital?: Better ("I am doing better than I thought I would; so far not having any real problems, my wife is helping me with anything I need, and I am using my walker.  Looking forward to PT starting, they are coming tomorrow for the first time") Any questions or concerns?: No  Items Reviewed: Did you receive and understand the discharge instructions provided?: Yes (thoroughly reviewed with patient who verbalizes excellent understanding of same) Medications obtained and verified?: Yes (Medications Reviewed) (Full medication review completed; no concerns or discrepancies identified; confirmed patient obtained/ is taking all newly Rx'd medications as instructed; self-manages medications and denies questions/ concerns around medications today) Any new allergies since your discharge?: No Dietary orders reviewed?: Yes Type of Diet Ordered:: Regular Do you have support at home?: Yes People in Home: spouse Name of Support/Comfort Primary Source: pt. independent in self-care activities at baseline; spouse assisting with care needs as needed/ indicated post- recent surgery  Home Care and Equipment/Supplies: [place] Ordered?: Yes Name of [place]:: [place]- tomorrow 08/23/22 first home visit scheduled Has Agency set up a time to come to your home?: Yes Shorter Visit Date: 08/23/22 Any new equipment or  medical supplies ordered?: No  Functional Questionnaire: Do you need assistance with bathing/showering or dressing?: Yes Do you need assistance with meal preparation?: Yes Do you need assistance with eating?: No Do you have difficulty maintaining continence: No Do you need assistance with getting out of bed/getting out of a chair/moving?: No ("sometimes") Do you have difficulty managing or taking your medications?: No (wife assisting post-recent hospitalization; patient verbalizes very good understanding of the purpose/ dosing/ scheduling of medications; reports 100% adherence in taking medications as instructed)  Follow up appointments reviewed: PCP Follow-up appointment confirmed?: Yes (care coordination outreach in real-time with scheduling care guide to successfully schedule hospital follow up PCP appointment) Date of PCP follow-up appointment?: 08/30/22 (verified by sugical provider discharge notes, recommended PCP OV after surgical provider HFU OV on 08/28/22, or within 2 weeks) Follow-up Provider: PCP [facility] Follow-up appointment confirmed?: Yes Date of Specialist follow-up appointment?: 08/28/22 Follow-Up Specialty Provider:: Surgeon, Dr. Pieter Partridge [place] Do you need transportation to your follow-up appointment?: No Do you understand care options if your condition(s) worsen?: Yes-patient verbalized understanding  SDOH Interventions Today    Flowsheet Row Most Recent Value  SDOH Interventions   Food Insecurity Interventions Intervention Not Indicated  Transportation Interventions Intervention Not Indicated  [wife provides transportation post-recent surgery]      TOC Interventions Today    Flowsheet Row Most Recent Value  TOC Interventions   TOC Interventions Discussed/Reviewed TOC Interventions Discussed, Post op wound/incision care, Arranged PCP follow up less than 12 days/Care Guide scheduled  [provided my direct contact information should questions/ concerns/ needs  arise post-TOC call, prior to RN CM telephone visit]      Interventions Today    Flowsheet Row Most Recent Value  Chronic Disease   Chronic disease during today's visit Other  [recent spinal surgery with complications/ extended hospitalization]  General Interventions   General Interventions Discussed/Reviewed General Interventions Discussed, Doctor Visits, Referral to Nurse, Communication with  Doctor Visits Discussed/Reviewed Doctor Visits Discussed, PCP, Specialist  PCP/Specialist Visits Compliance with follow-up visit  Communication with RN  Exercise Interventions   Exercise Discussed/Reviewed Exercise Discussed  [facility] services through [place]  Education Interventions   Education Provided Provided Education  Provided Verbal Education On Medication, When to see the doctor, Other  [side effects of narcotics,  need to monitor/ record blood pressures at home/ take to PCP office visit,  specific instructions/ details around multiple medication changes post-hospital discharge]  Nutrition Interventions   Nutrition Discussed/Reviewed Nutrition Discussed  Pharmacy Interventions   Pharmacy Dicussed/Reviewed Pharmacy Topics Discussed  [Full medication review with updating medication list in EHR per patient report]      Oneta Rack, RN, BSN, CCRN Alumnus RN CM Care Coordination/ Transition of [place] [facility] [phone]: direct office

## 2022-08-23 DIAGNOSIS — D62 Acute posthemorrhagic anemia: Secondary | ICD-10-CM | POA: Diagnosis not present

## 2022-08-23 DIAGNOSIS — G8929 Other chronic pain: Secondary | ICD-10-CM | POA: Diagnosis not present

## 2022-08-23 DIAGNOSIS — F419 Anxiety disorder, unspecified: Secondary | ICD-10-CM | POA: Diagnosis not present

## 2022-08-23 DIAGNOSIS — M48061 Spinal stenosis, lumbar region without neurogenic claudication: Secondary | ICD-10-CM | POA: Diagnosis not present

## 2022-08-23 DIAGNOSIS — M4185 Other forms of scoliosis, thoracolumbar region: Secondary | ICD-10-CM | POA: Diagnosis not present

## 2022-08-23 DIAGNOSIS — Z4789 Encounter for other orthopedic aftercare: Secondary | ICD-10-CM | POA: Diagnosis not present

## 2022-08-23 DIAGNOSIS — M5416 Radiculopathy, lumbar region: Secondary | ICD-10-CM | POA: Diagnosis not present

## 2022-08-23 DIAGNOSIS — F32A Depression, unspecified: Secondary | ICD-10-CM | POA: Diagnosis not present

## 2022-08-23 DIAGNOSIS — N179 Acute kidney failure, unspecified: Secondary | ICD-10-CM | POA: Diagnosis not present

## 2022-08-25 DIAGNOSIS — M4185 Other forms of scoliosis, thoracolumbar region: Secondary | ICD-10-CM | POA: Diagnosis not present

## 2022-08-25 DIAGNOSIS — M5416 Radiculopathy, lumbar region: Secondary | ICD-10-CM | POA: Diagnosis not present

## 2022-08-25 DIAGNOSIS — M48061 Spinal stenosis, lumbar region without neurogenic claudication: Secondary | ICD-10-CM | POA: Diagnosis not present

## 2022-08-25 DIAGNOSIS — Z4789 Encounter for other orthopedic aftercare: Secondary | ICD-10-CM | POA: Diagnosis not present

## 2022-08-25 DIAGNOSIS — D62 Acute posthemorrhagic anemia: Secondary | ICD-10-CM | POA: Diagnosis not present

## 2022-08-25 DIAGNOSIS — G8929 Other chronic pain: Secondary | ICD-10-CM | POA: Diagnosis not present

## 2022-08-25 DIAGNOSIS — F32A Depression, unspecified: Secondary | ICD-10-CM | POA: Diagnosis not present

## 2022-08-25 DIAGNOSIS — N179 Acute kidney failure, unspecified: Secondary | ICD-10-CM | POA: Diagnosis not present

## 2022-08-25 DIAGNOSIS — F419 Anxiety disorder, unspecified: Secondary | ICD-10-CM | POA: Diagnosis not present

## 2022-08-28 DIAGNOSIS — M4316 Spondylolisthesis, lumbar region: Secondary | ICD-10-CM | POA: Diagnosis not present

## 2022-08-29 DIAGNOSIS — G8929 Other chronic pain: Secondary | ICD-10-CM | POA: Diagnosis not present

## 2022-08-29 DIAGNOSIS — M5416 Radiculopathy, lumbar region: Secondary | ICD-10-CM | POA: Diagnosis not present

## 2022-08-29 DIAGNOSIS — M4185 Other forms of scoliosis, thoracolumbar region: Secondary | ICD-10-CM | POA: Diagnosis not present

## 2022-08-29 DIAGNOSIS — N179 Acute kidney failure, unspecified: Secondary | ICD-10-CM | POA: Diagnosis not present

## 2022-08-29 DIAGNOSIS — F419 Anxiety disorder, unspecified: Secondary | ICD-10-CM | POA: Diagnosis not present

## 2022-08-29 DIAGNOSIS — D62 Acute posthemorrhagic anemia: Secondary | ICD-10-CM | POA: Diagnosis not present

## 2022-08-29 DIAGNOSIS — F32A Depression, unspecified: Secondary | ICD-10-CM | POA: Diagnosis not present

## 2022-08-29 DIAGNOSIS — M48061 Spinal stenosis, lumbar region without neurogenic claudication: Secondary | ICD-10-CM | POA: Diagnosis not present

## 2022-08-29 DIAGNOSIS — Z4789 Encounter for other orthopedic aftercare: Secondary | ICD-10-CM | POA: Diagnosis not present

## 2022-08-30 ENCOUNTER — Ambulatory Visit: Payer: Medicare PPO | Admitting: Emergency Medicine

## 2022-08-30 ENCOUNTER — Encounter: Payer: Self-pay | Admitting: Emergency Medicine

## 2022-08-30 VITALS — BP 128/72 | HR 93 | Temp 97.6°F | Ht 65.0 in | Wt 150.2 lb

## 2022-08-30 DIAGNOSIS — M5416 Radiculopathy, lumbar region: Secondary | ICD-10-CM | POA: Diagnosis not present

## 2022-08-30 DIAGNOSIS — Z86718 Personal history of other venous thrombosis and embolism: Secondary | ICD-10-CM

## 2022-08-30 DIAGNOSIS — N179 Acute kidney failure, unspecified: Secondary | ICD-10-CM | POA: Diagnosis not present

## 2022-08-30 DIAGNOSIS — D62 Acute posthemorrhagic anemia: Secondary | ICD-10-CM | POA: Diagnosis not present

## 2022-08-30 DIAGNOSIS — G8929 Other chronic pain: Secondary | ICD-10-CM | POA: Diagnosis not present

## 2022-08-30 DIAGNOSIS — F419 Anxiety disorder, unspecified: Secondary | ICD-10-CM | POA: Diagnosis not present

## 2022-08-30 DIAGNOSIS — Z09 Encounter for follow-up examination after completed treatment for conditions other than malignant neoplasm: Secondary | ICD-10-CM | POA: Diagnosis not present

## 2022-08-30 DIAGNOSIS — F32A Depression, unspecified: Secondary | ICD-10-CM | POA: Diagnosis not present

## 2022-08-30 DIAGNOSIS — M48062 Spinal stenosis, lumbar region with neurogenic claudication: Secondary | ICD-10-CM | POA: Diagnosis not present

## 2022-08-30 DIAGNOSIS — M48061 Spinal stenosis, lumbar region without neurogenic claudication: Secondary | ICD-10-CM | POA: Diagnosis not present

## 2022-08-30 DIAGNOSIS — I10 Essential (primary) hypertension: Secondary | ICD-10-CM | POA: Diagnosis not present

## 2022-08-30 DIAGNOSIS — M4185 Other forms of scoliosis, thoracolumbar region: Secondary | ICD-10-CM | POA: Diagnosis not present

## 2022-08-30 DIAGNOSIS — Z4789 Encounter for other orthopedic aftercare: Secondary | ICD-10-CM | POA: Diagnosis not present

## 2022-08-30 NOTE — Assessment & Plan Note (Signed)
Well-controlled hypertension. Presently only taking amlodipine 2.5 mg daily Normal blood pressure readings at home Not taking lisinopril at present time.

## 2022-08-30 NOTE — Assessment & Plan Note (Signed)
Status post recent lumbar surgery Recovering well.  No complications. Pain management discussed Pain well-controlled at present time. Getting physical therapy at home

## 2022-08-30 NOTE — Patient Instructions (Signed)
Health Maintenance After Age 69 After age 69, you are at a higher risk for certain long-term diseases and infections as well as injuries from falls. Falls are a major cause of broken bones and head injuries in people who are older than age 69. Getting regular preventive care can help to keep you healthy and well. Preventive care includes getting regular testing and making lifestyle changes as recommended by your health care provider. Talk with your health care provider about: Which screenings and tests you should have. A screening is a test that checks for a disease when you have no symptoms. A diet and exercise plan that is right for you. What should I know about screenings and tests to prevent falls? Screening and testing are the best ways to find a health problem early. Early diagnosis and treatment give you the best chance of managing medical conditions that are common after age 69. Certain conditions and lifestyle choices may make you more likely to have a fall. Your health care provider may recommend: Regular vision checks. Poor vision and conditions such as cataracts can make you more likely to have a fall. If you wear glasses, make sure to get your prescription updated if your vision changes. Medicine review. Work with your health care provider to regularly review all of the medicines you are taking, including over-the-counter medicines. Ask your health care provider about any side effects that may make you more likely to have a fall. Tell your health care provider if any medicines that you take make you feel dizzy or sleepy. Strength and balance checks. Your health care provider may recommend certain tests to check your strength and balance while standing, walking, or changing positions. Foot health exam. Foot pain and numbness, as well as not wearing proper footwear, can make you more likely to have a fall. Screenings, including: Osteoporosis screening. Osteoporosis is a condition that causes  the bones to get weaker and break more easily. Blood pressure screening. Blood pressure changes and medicines to control blood pressure can make you feel dizzy. Depression screening. You may be more likely to have a fall if you have a fear of falling, feel depressed, or feel unable to do activities that you used to do. Alcohol use screening. Using too much alcohol can affect your balance and may make you more likely to have a fall. Follow these instructions at home: Lifestyle Do not drink alcohol if: Your health care provider tells you not to drink. If you drink alcohol: Limit how much you have to: 0-1 drink a day for women. 0-2 drinks a day for men. Know how much alcohol is in your drink. In the [place] one drink equals one 12 oz bottle of beer (355 mL), one 5 oz glass of wine (148 mL), or one 1 oz glass of hard liquor (44 mL). Do not use any products that contain nicotine or tobacco. These products include cigarettes, chewing tobacco, and vaping devices, such as e-cigarettes. If you need help quitting, ask your health care provider. Activity  Follow a regular exercise program to stay fit. This will help you maintain your balance. Ask your health care provider what types of exercise are appropriate for you. If you need a cane or walker, use it as recommended by your health care provider. Wear supportive shoes that have nonskid soles. Safety  Remove any tripping hazards, such as rugs, cords, and clutter. Install safety equipment such as grab bars in bathrooms and safety rails on stairs. Keep rooms and walkways   well-lit. General instructions Talk with your health care provider about your risks for falling. Tell your health care provider if: You fall. Be sure to tell your health care provider about all falls, even ones that seem minor. You feel dizzy, tiredness (fatigue), or off-balance. Take over-the-counter and prescription medicines only as told by your health care provider. These include  supplements. Eat a healthy diet and maintain a healthy weight. A healthy diet includes low-fat dairy products, low-fat (lean) meats, and fiber from whole grains, beans, and lots of fruits and vegetables. Stay current with your vaccines. Schedule regular health, dental, and eye exams. Summary Having a healthy lifestyle and getting preventive care can help to protect your health and wellness after age 69. Screening and testing are the best way to find a health problem early and help you avoid having a fall. Early diagnosis and treatment give you the best chance for managing medical conditions that are more common for people who are older than age 69. Falls are a major cause of broken bones and head injuries in people who are older than age 69. Take precautions to prevent a fall at home. Work with your health care provider to learn what changes you can make to improve your health and wellness and to prevent falls. This information is not intended to replace advice given to you by your health care provider. Make sure you discuss any questions you have with your health care provider. Document Revised: 10/11/2020 Document Reviewed: 10/11/2020 Elsevier Patient Education  2023 [employer]  

## 2022-08-30 NOTE — Progress Notes (Signed)
John Mcclain 67 y.o.   Chief Complaint  Patient presents with   Hospitalization Follow-up    D/c 08/21/2022, patient states he had back surgery on 08/04/2022 and 08/08/2022. Patient is in PT at home     HISTORY OF PRESENT ILLNESS: This is a 67 y.o. male here for hospital discharge follow-up Recently admitted for spinal surgery Developed DVT of left thigh while in the hospital.  Started on Eliquis.  Plan is to keep him on Eliquis for 3 to 6 months. Pain well-controlled.  Recovering well. No complaints or any other medical concerns today. Getting physical therapy at home and doing well.  HPI   Prior to Admission medications   Medication Sig Start Date End Date Taking? Authorizing Provider  acetaminophen (TYLENOL) 500 MG tablet Take 500 mg by mouth every 6 (six) hours as needed for moderate pain.   Yes [provider]  alendronate (FOSAMAX) 70 MG tablet Take 70 mg by mouth every Saturday. Take with a full glass of water on an empty stomach.   Yes [provider]  amLODipine (NORVASC) 2.5 MG tablet TAKE 1 TABLET EVERY DAY (NEED TO SCHEDULE A 6 MONTH FOLLOW UP APPOINTMENT) 08/15/22  Yes Sahas Sluka, Ines Bloomer, MD  apixaban (ELIQUIS) 5 MG TABS tablet Take 1 tablet (5 mg total) by mouth 2 (two) times daily. Take two tabs twice daily on 3/18, 3/19, 3/20, and 3/21. On 3/22, decrease to one tab twice daily and continue. 08/21/22  Yes Caroline More Caylin, PA-C  Calcium Carbonate-Vitamin D (CALCIUM 600+D PO) Take 1 tablet by mouth daily.   Yes [provider]  clobetasol (OLUX) 0.05 % topical foam Apply topically 2 (two) times daily. Patient taking differently: Apply 1 Application topically daily as needed (psoriasis). 05/24/22  Yes Lillis Nuttle, Ines Bloomer, MD  cyclobenzaprine (FLEXERIL) 10 MG tablet Take 10 mg by mouth 3 (three) times daily as needed for muscle spasms.   Yes [provider]  Fremanezumab-vfrm (AJOVY) 225 MG/1.5ML SOAJ Inject 225 mg into the skin  every 30 (thirty) days.   Yes [provider]  levETIRAcetam (KEPPRA) 750 MG tablet Take 750 mg by mouth 2 (two) times daily.    Yes [provider]  LORazepam (ATIVAN) 1 MG tablet TAKE 1/2 TO 1 TABLET EVERY DAY AS NEEDED FOR ANXIETY AS SPARINGLY AS POSSIBLE 06/26/22  Yes Tarri Guilfoil, Ines Bloomer, MD  Magnesium 400 MG TABS Take 400 mg by mouth every evening.   Yes [provider]  oxyCODONE-acetaminophen (PERCOCET/ROXICET) 5-325 MG tablet Take 1 tablet by mouth every 4 (four) hours as needed for severe pain.   Yes Horald Pollen, MD  pantoprazole (PROTONIX) 40 MG tablet Take 1 tablet (40 mg total) by mouth daily. 08/15/21  Yes Cirigliano, Vito V, DO  polyethylene glycol powder (GLYCOLAX/MIRALAX) powder Take 17 g by mouth daily. 05/24/17  Yes Tereasa Coop, PA-C  rosuvastatin (CRESTOR) 5 MG tablet TAKE 1 TABLET EVERY DAY 08/13/22  Yes Ashanna Heinsohn, Ines Bloomer, MD  minocycline (MINOCIN) 50 MG capsule TAKE 1 CAPSULE TWICE DAILY Patient not taking: Reported on 07/27/2022 01/30/22   Lavonna Monarch, MD  pregabalin (LYRICA) 75 MG capsule TAKE 1 CAPSULE TWICE DAILY 06/27/22   Lorine Bears, NP  tadalafil (CIALIS) 5 MG tablet Take 1 tablet (5 mg total) by mouth daily as needed for erectile dysfunction. Patient not taking: Reported on 07/27/2022 02/04/21 05/05/21  Horald Pollen, MD  Upadacitinib ER [facility]) 15 MG TB24 Take 15 mg by mouth daily. Patient not taking:  Reported on 08/22/2022 07/17/18   [provider]    Allergies  Allergen Reactions   Penicillins Rash    Patient Active Problem List   Diagnosis Date Noted   Degenerative scoliosis in adult patient 08/04/2022   Lumbar radiculopathy 05/03/2020   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   Spondylosis without myelopathy or radiculopathy, lumbar region 05/03/2020   Hypogonadism male 12/26/2011   Vitamin D deficiency 12/26/2011   Rheumatoid arthritis ([place]) 12/26/2011    Migraine headache disorder 12/26/2011   Anxiety disorder 12/26/2011   HTN (hypertension), benign 12/26/2011    Past Medical History:  Diagnosis Date   Allergy 1959   Anxiety    Arthritis    RA   Back pain    Blood transfusion without reported diagnosis 08/14/2022   Chronic migraine    Depression    GERD (gastroesophageal reflux disease)    Heart murmur    History of kidney stones    Hypertension    Pneumonia    Raynaud's disease    Rheumatoid arthritis ([place])    Ulcer     Past Surgical History:  Procedure Laterality Date   ABDOMINAL EXPOSURE N/A 08/04/2022   Procedure: ABDOMINAL EXPOSURE;  Surgeon: Marty Heck, MD;  Location: [place];  Service: Vascular;  Laterality: N/A;   ANTERIOR LAT LUMBAR FUSION N/A 08/04/2022   Procedure: Anterior Lumbar Interbody Fusion Lumbar Five-Sacral One;  Surgeon: Karsten Ro, DO;  Location: [place];  Service: Neurosurgery;  Laterality: N/A;   ANTERIOR LAT LUMBAR FUSION N/A 08/04/2022   Procedure: ANTERIOR LATERAL INTERBODY FUSION LUMBAR TWO-THREE, LUMBAR THREE-FOUR, LUMBAR FOUR-FIVE;  Surgeon: Dawley, Theodoro Doing, DO;  Location: [place];  Service: Neurosurgery;  Laterality: N/A;   COLON SURGERY  2000   polyps removed   COLONOSCOPY WITH ESOPHAGOGASTRODUODENOSCOPY (EGD)  2023   ESOPHAGEAL DILATION  2023   LAMINECTOMY WITH POSTERIOR LATERAL ARTHRODESIS LEVEL 3 N/A 08/08/2022   Procedure: OPEN THORACOLUMBAR INTRUMENTATION,FUSION, THORACIC TEN-PELVIS, LUMBAR THREE-SACRAL ONE DECOMPRESSION  LUMBAR TWO-LUMBAR FIVE POSTERIOR COLUMN OSTEOTOMIES;  Surgeon: Dawley, Theodoro Doing, DO;  Location: [place];  Service: Neurosurgery;  Laterality: N/A;   LYMPH NODE DISSECTION     removed lymph node   SPINE SURGERY  06/05/22&06/09/22   VASECTOMY      Social History   Socioeconomic History   Marital status: Married    Spouse name: Not on file   Number of children: Not on file   Years of education: Not on file   Highest education level: Bachelor's degree (e.g., BA, AB,  BS)  Occupational History   Not on file  Tobacco Use   Smoking status: Never   Smokeless tobacco: Never  Vaping Use   Vaping Use: Never used  Substance and Sexual Activity   Alcohol use: Yes    Alcohol/week: 1.0 standard drink of alcohol    Types: 1 Standard drinks or equivalent per week    Comment: occasionally   Drug use: No   Sexual activity: Yes    Partners: Female    Comment: Vasectomy  Other Topics Concern   Not on file  Social History Narrative   Married. Education: [employer].    Social Determinants of Health   Financial Resource Strain: Low Risk  (08/29/2022)   Overall Financial Resource Strain (CARDIA)    Difficulty of Paying Living Expenses: Not hard at all  Food Insecurity: No Food Insecurity (08/29/2022)   Hunger Vital Sign    Worried About Running Out of Food in  the Last Year: Never true    [place] in the Last Year: Never true  Transportation Needs: No Transportation Needs (08/29/2022)   PRAPARE - Hydrologist (Medical): No    Lack of Transportation (Non-Medical): No  Physical Activity: Sufficiently Active (08/29/2022)   Exercise Vital Sign    Days of Exercise per Week: 7 days    Minutes of Exercise per Session: 30 min  Stress: No Stress Concern Present (08/29/2022)   [place]    Feeling of Stress : Only a little  Social Connections: Unknown (08/29/2022)   Social Connection and Isolation Panel [NHANES]    Frequency of Communication with Friends and Family: More than three times a week    Frequency of Social Gatherings with Friends and Family: Once a week    Attends Religious Services: Patient declined    Marine scientist or Organizations: No    Attends Music therapist: Not on file    Marital Status: Married  Human resources officer Violence: Not At Risk (08/04/2022)   Humiliation, Afraid, Rape, and Kick questionnaire    Fear of Current or  Ex-Partner: No    Emotionally Abused: No    Physically Abused: No    Sexually Abused: No    Family History  Problem Relation Age of Onset   Kidney disease Mother    Hypertension Mother    Arthritis Mother        RA   COPD Mother    Hyperlipidemia Mother    Heart attack Father    Diabetes Father    COPD Father    Hypertension Father    Heart disease Father    Migraines Brother    Stroke Maternal Grandfather    Alzheimer's disease Paternal Grandmother    Colon cancer Neg Hx    Pancreatic cancer Neg Hx    Esophageal cancer Neg Hx      Review of Systems  Constitutional: Negative.  Negative for chills and fever.  HENT: Negative.  Negative for congestion and sore throat.   Respiratory: Negative.  Negative for cough and shortness of breath.   Cardiovascular: Negative.  Negative for chest pain and palpitations.  Gastrointestinal:  Negative for abdominal pain, diarrhea, nausea and vomiting.  Genitourinary: Negative.  Negative for dysuria and hematuria.  Skin: Negative.  Negative for rash.  Neurological: Negative.  Negative for dizziness and headaches.  All other systems reviewed and are negative.   Today's Vitals   08/30/22 1524  BP: 128/72  Pulse: 93  Temp: 97.6 F (36.4 C)  TempSrc: Oral  SpO2: 97%  Weight: 150 lb 4 oz (68.2 kg)  Height: 5\' 5"  (1.651 m)   Body mass index is 25 kg/m.   Physical Exam Vitals reviewed.  Constitutional:      Appearance: Normal appearance.  HENT:     Head: Normocephalic.     Mouth/Throat:     Mouth: Mucous membranes are moist.     Pharynx: Oropharynx is clear.  Eyes:     Extraocular Movements: Extraocular movements intact.     Pupils: Pupils are equal, round, and reactive to light.  Cardiovascular:     Rate and Rhythm: Normal rate and regular rhythm.     Pulses: Normal pulses.     Heart sounds: Normal heart sounds.  Pulmonary:     Effort: Pulmonary effort is normal.     Breath sounds: Normal breath sounds.  Musculoskeletal:     Cervical back: No tenderness.  Lymphadenopathy:     Cervical: No cervical adenopathy.  Skin:    General: Skin is warm and dry.     Capillary Refill: Capillary refill takes less than 2 seconds.  Neurological:     General: No focal deficit present.     Mental Status: He is alert and oriented to person, place, and time.  Psychiatric:        Mood and Affect: Mood normal.        Behavior: Behavior normal.      ASSESSMENT & PLAN: A total of 45 minutes was spent with the patient and counseling/coordination of care regarding preparing for this visit, review of most recent office visit notes, review of most recent hospital discharge summary notes, review of chronic medical conditions under management, review of all medications, pain management, fall precautions, prognosis, documentation and need for follow-up.  Problem List Items Addressed This Visit       Cardiovascular and Mediastinum   HTN (hypertension), benign - Primary    Well-controlled hypertension. Presently only taking amlodipine 2.5 mg daily Normal blood pressure readings at home Not taking lisinopril at present time.        Other   Spinal stenosis of lumbar region with neurogenic claudication    Status post recent lumbar surgery Recovering well.  No complications. Pain management discussed Pain well-controlled at present time. Getting physical therapy at home      History of DVT of lower extremity    Provoked event.  Recent hospitalization. Presently on Eliquis 5 mg twice a day Plan is to continue for 3 to 6 months. Has follow-up appointment with cardiovascular surgeon Fall precautions given.      Other Visit Diagnoses     Hospital discharge follow-up          Patient Instructions  Health Maintenance After Age 7 After age 39, you are at a higher risk for certain long-term diseases and infections as well as injuries from falls. Falls are a major cause of broken bones and head  injuries in people who are older than age 13. Getting regular preventive care can help to keep you healthy and well. Preventive care includes getting regular testing and making lifestyle changes as recommended by your health care provider. Talk with your health care provider about: Which screenings and tests you should have. A screening is a test that checks for a disease when you have no symptoms. A diet and exercise plan that is right for you. What should I know about screenings and tests to prevent falls? Screening and testing are the best ways to find a health problem early. Early diagnosis and treatment give you the best chance of managing medical conditions that are common after age 62. Certain conditions and lifestyle choices may make you more likely to have a fall. Your health care provider may recommend: Regular vision checks. Poor vision and conditions such as cataracts can make you more likely to have a fall. If you wear glasses, make sure to get your prescription updated if your vision changes. Medicine review. Work with your health care provider to regularly review all of the medicines you are taking, including over-the-counter medicines. Ask your health care provider about any side effects that may make you more likely to have a fall. Tell your health care provider if any medicines that you take make you feel dizzy or sleepy. Strength and balance checks. Your health care provider may recommend certain tests  to check your strength and balance while standing, walking, or changing positions. Foot health exam. Foot pain and numbness, as well as not wearing proper footwear, can make you more likely to have a fall. Screenings, including: Osteoporosis screening. Osteoporosis is a condition that causes the bones to get weaker and break more easily. Blood pressure screening. Blood pressure changes and medicines to control blood pressure can make you feel dizzy. Depression screening. You may be more  likely to have a fall if you have a fear of falling, feel depressed, or feel unable to do activities that you used to do. Alcohol use screening. Using too much alcohol can affect your balance and may make you more likely to have a fall. Follow these instructions at home: Lifestyle Do not drink alcohol if: Your health care provider tells you not to drink. If you drink alcohol: Limit how much you have to: 0-1 drink a day for women. 0-2 drinks a day for men. Know how much alcohol is in your drink. In the [place] one drink equals one 12 oz bottle of beer (355 mL), one 5 oz glass of wine (148 mL), or one 1 oz glass of hard liquor (44 mL). Do not use any products that contain nicotine or tobacco. These products include cigarettes, chewing tobacco, and vaping devices, such as e-cigarettes. If you need help quitting, ask your health care provider. Activity  Follow a regular exercise program to stay fit. This will help you maintain your balance. Ask your health care provider what types of exercise are appropriate for you. If you need a cane or walker, use it as recommended by your health care provider. Wear supportive shoes that have nonskid soles. Safety  Remove any tripping hazards, such as rugs, cords, and clutter. Install safety equipment such as grab bars in bathrooms and safety rails on stairs. Keep rooms and walkways well-lit. General instructions Talk with your health care provider about your risks for falling. Tell your health care provider if: You fall. Be sure to tell your health care provider about all falls, even ones that seem minor. You feel dizzy, tiredness (fatigue), or off-balance. Take over-the-counter and prescription medicines only as told by your health care provider. These include supplements. Eat a healthy diet and maintain a healthy weight. A healthy diet includes low-fat dairy products, low-fat (lean) meats, and fiber from whole grains, beans, and lots of fruits and  vegetables. Stay current with your vaccines. Schedule regular health, dental, and eye exams. Summary Having a healthy lifestyle and getting preventive care can help to protect your health and wellness after age 31. Screening and testing are the best way to find a health problem early and help you avoid having a fall. Early diagnosis and treatment give you the best chance for managing medical conditions that are more common for people who are older than age 51. Falls are a major cause of broken bones and head injuries in people who are older than age 31. Take precautions to prevent a fall at home. Work with your health care provider to learn what changes you can make to improve your health and wellness and to prevent falls. This information is not intended to replace advice given to you by your health care provider. Make sure you discuss any questions you have with your health care provider. Document Revised: 10/11/2020 Document Reviewed: 10/11/2020 Elsevier Patient Education  [place], MD [facility]

## 2022-08-30 NOTE — Assessment & Plan Note (Addendum)
Provoked event.  Recent hospitalization. Presently on Eliquis 5 mg twice a day Plan is to continue for 3 to 6 months. Has follow-up appointment with cardiovascular surgeon Fall precautions given.

## 2022-09-01 DIAGNOSIS — M5416 Radiculopathy, lumbar region: Secondary | ICD-10-CM | POA: Diagnosis not present

## 2022-09-01 DIAGNOSIS — D62 Acute posthemorrhagic anemia: Secondary | ICD-10-CM | POA: Diagnosis not present

## 2022-09-01 DIAGNOSIS — M4185 Other forms of scoliosis, thoracolumbar region: Secondary | ICD-10-CM | POA: Diagnosis not present

## 2022-09-01 DIAGNOSIS — N179 Acute kidney failure, unspecified: Secondary | ICD-10-CM | POA: Diagnosis not present

## 2022-09-01 DIAGNOSIS — M48061 Spinal stenosis, lumbar region without neurogenic claudication: Secondary | ICD-10-CM | POA: Diagnosis not present

## 2022-09-01 DIAGNOSIS — Z4789 Encounter for other orthopedic aftercare: Secondary | ICD-10-CM | POA: Diagnosis not present

## 2022-09-01 DIAGNOSIS — G8929 Other chronic pain: Secondary | ICD-10-CM | POA: Diagnosis not present

## 2022-09-01 DIAGNOSIS — F32A Depression, unspecified: Secondary | ICD-10-CM | POA: Diagnosis not present

## 2022-09-01 DIAGNOSIS — F419 Anxiety disorder, unspecified: Secondary | ICD-10-CM | POA: Diagnosis not present

## 2022-09-02 ENCOUNTER — Other Ambulatory Visit: Payer: Self-pay | Admitting: Gastroenterology

## 2022-09-05 ENCOUNTER — Encounter: Payer: Self-pay | Admitting: Vascular Surgery

## 2022-09-05 ENCOUNTER — Ambulatory Visit: Payer: Medicare PPO | Admitting: Vascular Surgery

## 2022-09-05 VITALS — BP 127/88 | HR 98 | Temp 97.5°F | Resp 16 | Ht 65.0 in | Wt 150.0 lb

## 2022-09-05 DIAGNOSIS — Z86718 Personal history of other venous thrombosis and embolism: Secondary | ICD-10-CM

## 2022-09-05 MED ORDER — APIXABAN 5 MG PO TABS: 5.0000 mg | ORAL_TABLET | Freq: Two times a day (BID) | ORAL | 4 refills | Status: DC

## 2022-09-05 NOTE — Progress Notes (Signed)
Patient name: John Mcclain MRN: [id number] DOB: 04/10/1956 Sex: male  REASON FOR CONSULT: Evaluate left leg DVT following anterior spine exposure  HPI: John Mcclain is a 67 y.o. male, the presents for hospital follow-up.  He most recently underwent anterior spine exposure for L5-S1 ALIF on 08/04/2022.  He was seen in the hospital with extensive left leg swelling and found to have a DVT with iliac vein involvement diagnosed on 08/16/2022.  He was discharged on Eliquis.  We did not perform percutaneous intervention given his anterior spine exposure that involved retroperitoneal exposure.  His leg is much better.  He is tolerating the Eliquis.  He is wearing a thigh-high compression stocking.  Not having any pain.  Ambulatory without any issues.  Past Medical History:  Diagnosis Date   Allergy 1959   Anxiety    Arthritis    RA   Back pain    Blood transfusion without reported diagnosis 08/14/2022   Chronic migraine    Depression    GERD (gastroesophageal reflux disease)    Heart murmur    History of kidney stones    Hypertension    Pneumonia    Raynaud's disease    Rheumatoid arthritis    Ulcer     Past Surgical History:  Procedure Laterality Date   ABDOMINAL EXPOSURE N/A 08/04/2022   Procedure: ABDOMINAL EXPOSURE;  Surgeon: Marty Heck, MD;  Location: [facility];  Service: Vascular;  Laterality: N/A;   ANTERIOR LAT LUMBAR FUSION N/A 08/04/2022   Procedure: Anterior Lumbar Interbody Fusion Lumbar Five-Sacral One;  Surgeon: Karsten Ro, DO;  Location: [facility];  Service: Neurosurgery;  Laterality: N/A;   ANTERIOR LAT LUMBAR FUSION N/A 08/04/2022   Procedure: ANTERIOR LATERAL INTERBODY FUSION LUMBAR TWO-THREE, LUMBAR THREE-FOUR, LUMBAR FOUR-FIVE;  Surgeon: Dawley, Theodoro Doing, DO;  Location: [facility];  Service: Neurosurgery;  Laterality: N/A;   COLON SURGERY  2000   polyps removed   COLONOSCOPY WITH ESOPHAGOGASTRODUODENOSCOPY (EGD)  2023   ESOPHAGEAL DILATION  2023   LAMINECTOMY WITH  POSTERIOR LATERAL ARTHRODESIS LEVEL 3 N/A 08/08/2022   Procedure: OPEN THORACOLUMBAR INTRUMENTATION,FUSION, THORACIC TEN-PELVIS, LUMBAR THREE-SACRAL ONE DECOMPRESSION  LUMBAR TWO-LUMBAR FIVE POSTERIOR COLUMN OSTEOTOMIES;  Surgeon: Dawley, Theodoro Doing, DO;  Location: [facility];  Service: Neurosurgery;  Laterality: N/A;   LYMPH NODE DISSECTION     removed lymph node   SPINE SURGERY  06/05/22&06/09/22   VASECTOMY      Family History  Problem Relation Age of Onset   Kidney disease Mother    Hypertension Mother    Arthritis Mother        RA   COPD Mother    Hyperlipidemia Mother    Heart attack Father    Diabetes Father    COPD Father    Hypertension Father    Heart disease Father    Migraines Brother    Stroke Maternal Grandfather    Alzheimer's disease Paternal Grandmother    Colon cancer Neg Hx    Pancreatic cancer Neg Hx    Esophageal cancer Neg Hx     SOCIAL HISTORY: Social History   Socioeconomic History   Marital status: Married    Spouse name: Not on file   Number of children: Not on file   Years of education: Not on file   Highest education level: Bachelor's degree (e.g., BA, AB, BS)  Occupational History   Not on file  Tobacco Use   Smoking status: Never   Smokeless tobacco: Never  Vaping Use  Vaping Use: Never used  Substance and Sexual Activity   Alcohol use: Yes    Alcohol/week: 1.0 standard drink of alcohol    Types: 1 Standard drinks or equivalent per week    Comment: occasionally   Drug use: No   Sexual activity: Yes    Partners: Female    Comment: Vasectomy  Other Topics Concern   Not on file  Social History Narrative   Married. Education: [employer].    Social Determinants of Health   Financial Resource Strain: Low Risk  (08/29/2022)   Overall Financial Resource Strain (CARDIA)    Difficulty of Paying Living Expenses: Not hard at all  Food Insecurity: No Food Insecurity (08/29/2022)   Hunger Vital Sign    Worried About Running Out of Food in the Last  Year: Never true    Ran Out of Food in the Last Year: Never true  Transportation Needs: No Transportation Needs (08/29/2022)   PRAPARE - Hydrologist (Medical): No    Lack of Transportation (Non-Medical): No  Physical Activity: Sufficiently Active (08/29/2022)   Exercise Vital Sign    Days of Exercise per Week: 7 days    Minutes of Exercise per Session: 30 min  Stress: No Stress Concern Present (08/29/2022)   [place]    Feeling of Stress : Only a little  Social Connections: Unknown (08/29/2022)   Social Connection and Isolation Panel [NHANES]    Frequency of Communication with Friends and Family: More than three times a week    Frequency of Social Gatherings with Friends and Family: Once a week    Attends Religious Services: Patient declined    Marine scientist or Organizations: No    Attends Music therapist: Not on file    Marital Status: Married  Human resources officer Violence: Not At Risk (08/04/2022)   Humiliation, Afraid, Rape, and Kick questionnaire    Fear of Current or Ex-Partner: No    Emotionally Abused: No    Physically Abused: No    Sexually Abused: No    Allergies  Allergen Reactions   Penicillins Rash    Current Outpatient Medications  Medication Sig Dispense Refill   acetaminophen (TYLENOL) 500 MG tablet Take 500 mg by mouth every 6 (six) hours as needed for moderate pain.     alendronate (FOSAMAX) 70 MG tablet Take 70 mg by mouth every Saturday. Take with a full glass of water on an empty stomach.     amLODipine (NORVASC) 2.5 MG tablet TAKE 1 TABLET EVERY DAY (NEED TO SCHEDULE A 6 MONTH FOLLOW UP APPOINTMENT) 90 tablet 3   apixaban (ELIQUIS) 5 MG TABS tablet Take 1 tablet (5 mg total) by mouth 2 (two) times daily. Take two tabs twice daily on 3/18, 3/19, 3/20, and 3/21. On 3/22, decrease to one tab twice daily and continue. 60 tablet 1   Calcium  Carbonate-Vitamin D (CALCIUM 600+D PO) Take 1 tablet by mouth daily.     clobetasol (OLUX) 0.05 % topical foam Apply topically 2 (two) times daily. (Patient taking differently: Apply 1 Application topically daily as needed (psoriasis).) 50 g 1   cyclobenzaprine (FLEXERIL) 10 MG tablet Take 10 mg by mouth 3 (three) times daily as needed for muscle spasms.     Fremanezumab-vfrm (AJOVY) 225 MG/1.5ML SOAJ Inject 225 mg into the skin every 30 (thirty) days.     levETIRAcetam (KEPPRA) 750 MG tablet Take 750 mg by mouth  2 (two) times daily.      LORazepam (ATIVAN) 1 MG tablet TAKE 1/2 TO 1 TABLET EVERY DAY AS NEEDED FOR ANXIETY AS SPARINGLY AS POSSIBLE 30 tablet 1   Magnesium 400 MG TABS Take 400 mg by mouth every evening.     minocycline (MINOCIN) 50 MG capsule TAKE 1 CAPSULE TWICE DAILY 180 capsule 2   oxyCODONE-acetaminophen (PERCOCET/ROXICET) 5-325 MG tablet Take 1 tablet by mouth every 4 (four) hours as needed for severe pain.     pantoprazole (PROTONIX) 40 MG tablet TAKE 1 TABLET EVERY DAY 90 tablet 3   polyethylene glycol powder (GLYCOLAX/MIRALAX) powder Take 17 g by mouth daily. 3350 g prn   pregabalin (LYRICA) 75 MG capsule TAKE 1 CAPSULE TWICE DAILY 60 capsule 1   rosuvastatin (CRESTOR) 5 MG tablet TAKE 1 TABLET EVERY DAY 90 tablet 3   Upadacitinib ER (RINVOQ) 15 MG TB24 Take 15 mg by mouth daily.     tadalafil (CIALIS) 5 MG tablet Take 1 tablet (5 mg total) by mouth daily as needed for erectile dysfunction. (Patient not taking: Reported on 07/27/2022) 90 tablet 2   No current facility-administered medications for this visit.    REVIEW OF SYSTEMS:  [X]  denotes positive finding, [ ]  denotes negative finding Cardiac  Comments:  Chest pain or chest pressure:    Shortness of breath upon exertion:    Short of breath when lying flat:    Irregular heart rhythm:        Vascular    Pain in calf, thigh, or hip brought on by ambulation:    Pain in feet at night that wakes you up from your sleep:      Blood clot in your veins:    Leg swelling:  x       Pulmonary    Oxygen at home:    Productive cough:     Wheezing:         Neurologic    Sudden weakness in arms or legs:     Sudden numbness in arms or legs:     Sudden onset of difficulty speaking or slurred speech:    Temporary loss of vision in one eye:     Problems with dizziness:         Gastrointestinal    Blood in stool:     Vomited blood:         Genitourinary    Burning when urinating:     Blood in urine:        Psychiatric    Major depression:         Hematologic    Bleeding problems:    Problems with blood clotting too easily:        Skin    Rashes or ulcers:        Constitutional    Fever or chills:      PHYSICAL EXAM: Vitals:   09/05/22 1403  BP: 127/88  Pulse: 98  Resp: 16  Temp: (!) 97.5 F (36.4 C)  TempSrc: Temporal  SpO2: 98%  Weight: 150 lb (68 kg)  Height: 5\' 5"  (1.651 m)    GENERAL: The patient is a well-nourished male, in no acute distress. The vital signs are documented above. CARDIAC: There is a regular rate and rhythm.  VASCULAR:  Left calf circumference 32 cm versus the right calf is 30 cm PULMONARY: No respiratory distress. ABDOMEN: Soft and non-tender.  Incision from spine exposure well healed. MUSCULOSKELETAL: There are no major deformities or  cyanosis. NEUROLOGIC: No focal weakness or paresthesias are detected. SKIN: There are no ulcers or rashes noted. PSYCHIATRIC: The patient has a normal affect.  DATA:   N/A  Assessment/Plan:  67 y.o. male, the presents for hospital follow-up.  He most recently underwent anterior spine exposure for L5-S1 ALIF on 08/04/2022.  He was seen in the hospital with extensive left leg swelling and found to have a DVT with iliac vein involvement diagnosed on 08/16/2022.  He was discharged on Eliquis.  His left leg looks much better since he was discharged from the hospital.  I again discussed the option of percutaneous mechanical  thrombectomy but given significant improvement with anticoagulation and conservative measures we have agreed to defer intervention.  I do think he would be higher risk for intervention given his spine exposure that involved retroperitoneal exposure and we discussed risk of retroperitoneal bleed.  I have recommended 6 months of anticoagulation on Eliquis for first-time provoked DVT.  I have instructed him to continue wearing his thigh-high compression stockings.  Follow-up PRN.   Marty Heck, MD [facility] Office: [phone]

## 2022-09-07 ENCOUNTER — Telehealth: Payer: Self-pay

## 2022-09-07 NOTE — Patient Outreach (Signed)
  Care Coordination   09/07/2022 Name: Brain Agosta MRN: [id number] DOB: 1956-01-19   Care Coordination Outreach Attempts:  An unsuccessful telephone outreach was attempted for a scheduled appointment today.  Follow Up Plan:  Additional outreach attempts will be made to offer the patient care coordination information and services.   Encounter Outcome:  No Answer   Care Coordination Interventions:  No, not indicated    Thea Silversmith, RN, MSN, BSN, [place] Coordinator [phone]

## 2022-09-09 IMAGING — MR MR LUMBAR SPINE W/O CM
4 of 5 series · 24 of 48 positions shown · non-contrast
Comparison: Plain films November 26, 2019

CLINICAL DATA: Lumbar radiculopathy.

EXAM:
MRI LUMBAR SPINE WITHOUT CONTRAST
TECHNIQUE: Multiplanar, multisequence MR imaging of the lumbar spine was
performed. No intravenous contrast was administered.

[Series 3: T2 post-contrast · sagittal · 4.0mm · 0.53mm/px · 6 of 19 slices shown]
[im 1/19]
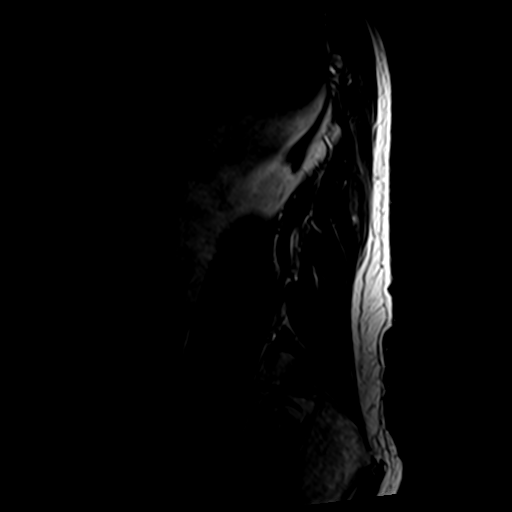
[im 4/19]
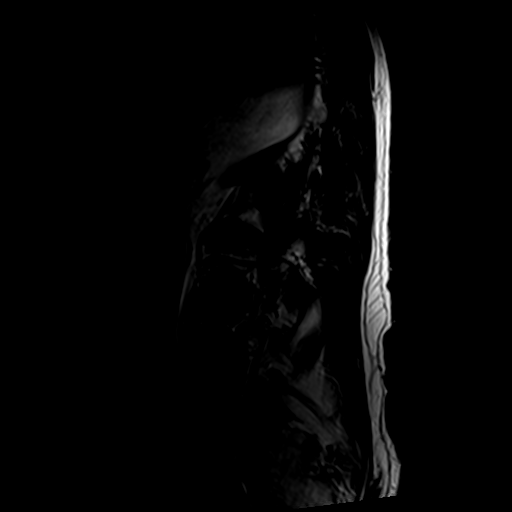
[im 8/19]
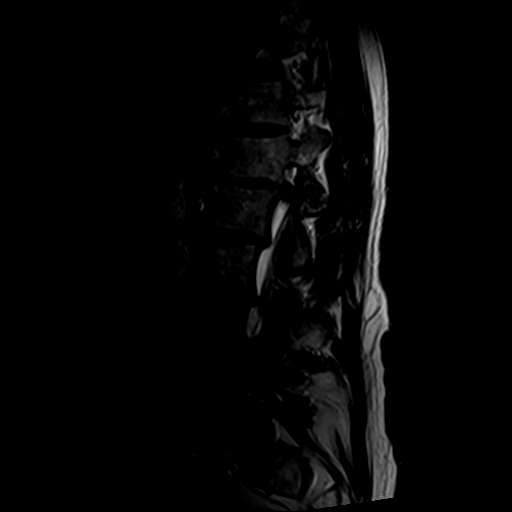
[im 11/19]
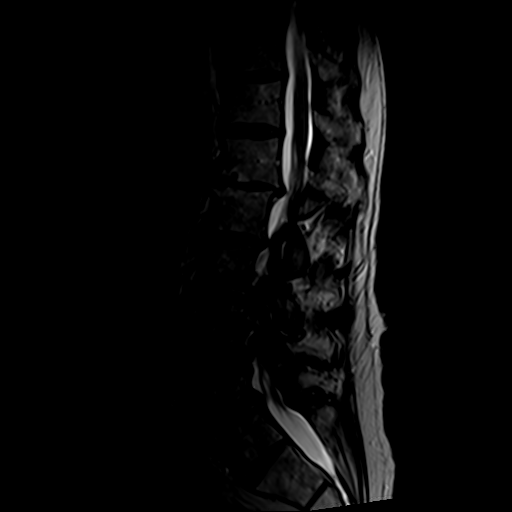
[im 15/19]
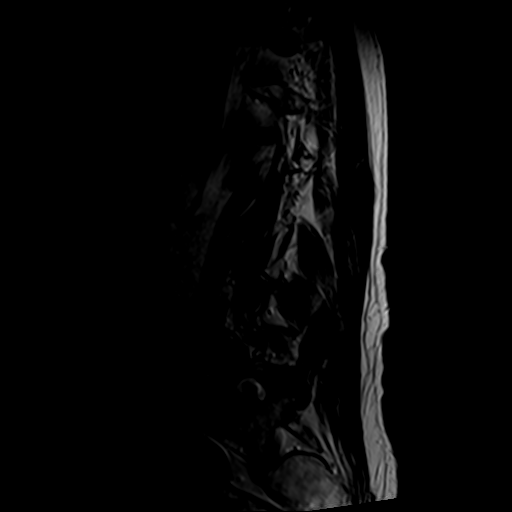
[im 19/19]
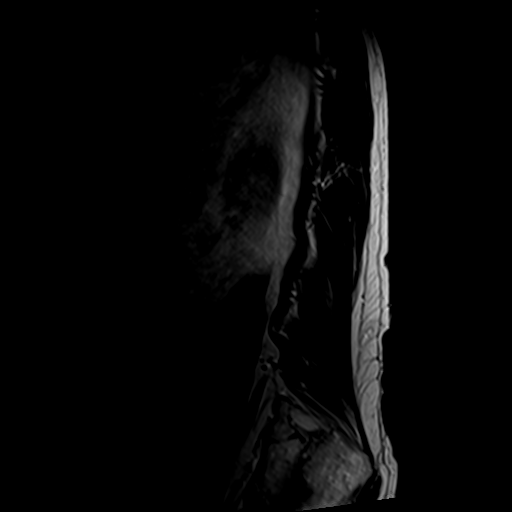

[Series 5: T1 · sagittal · 4.0mm · 0.53mm/px · 7 of 19 slices shown (1 of 2)]
[im 1/19]
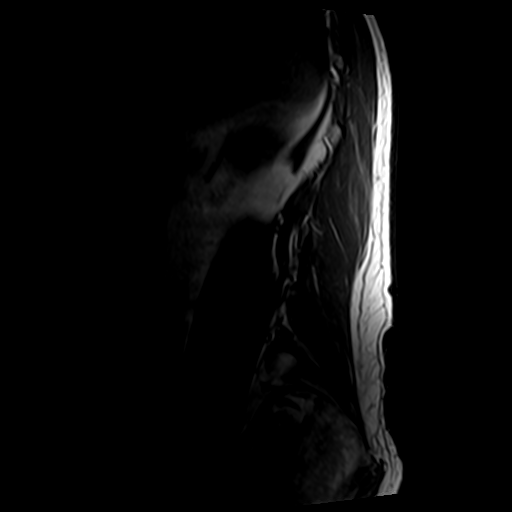
[im 4/19]
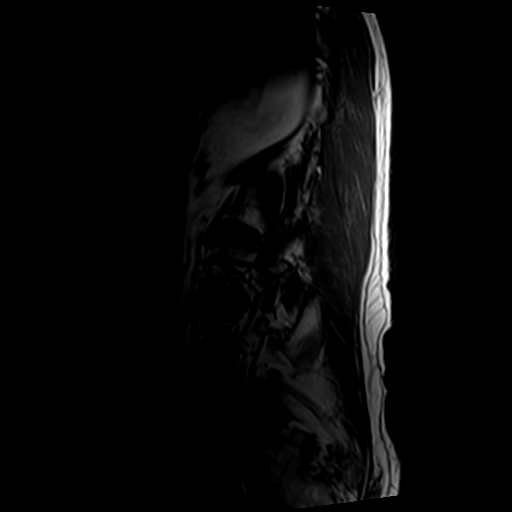
[im 7/19]
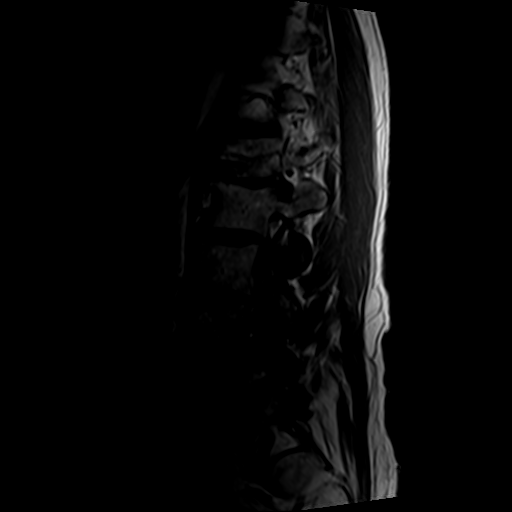
[im 10/19]
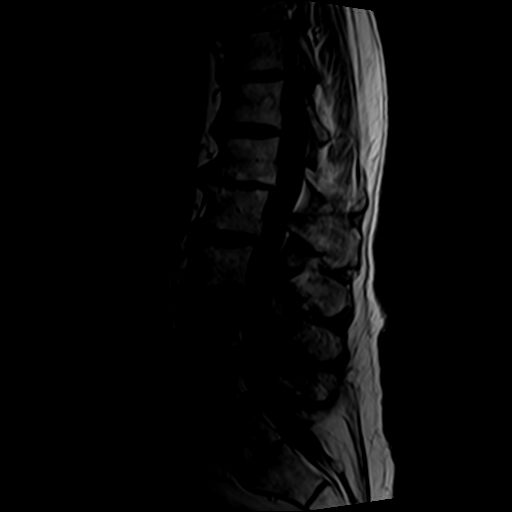
[im 13/19]
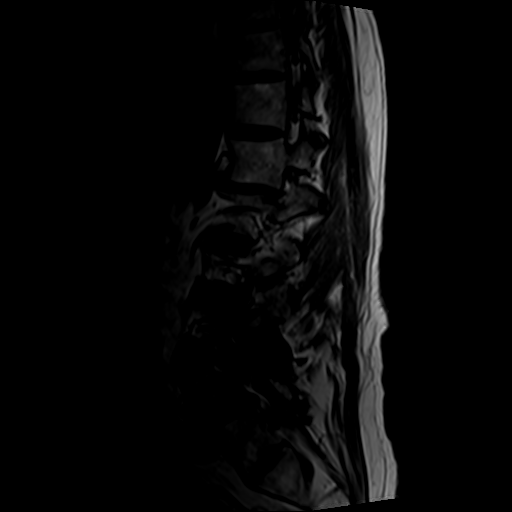
[im 16/19]
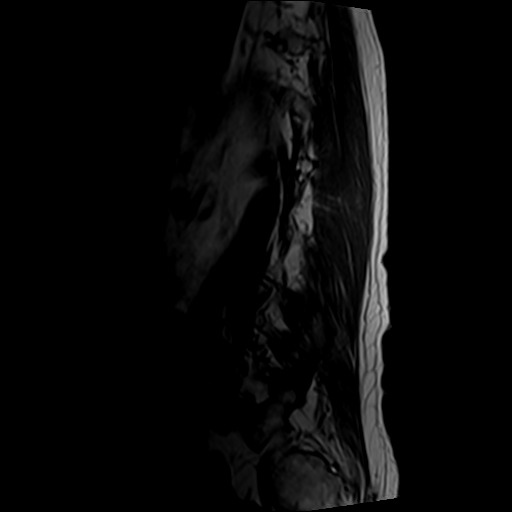
[im 19/19]
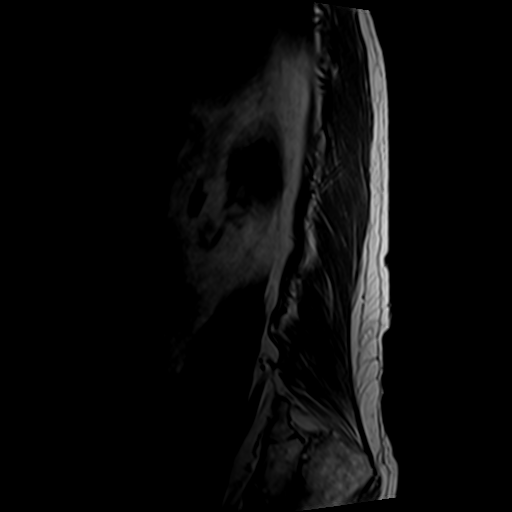

[Series 6: T2 · axial · 4.0mm · 0.70mm/px · z∈[-63,+155]mm · 8 of 41 slices shown]
[im 1/41]
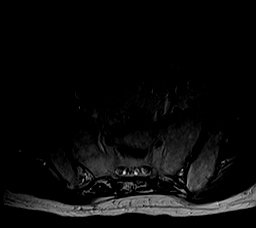
[im 7/41]
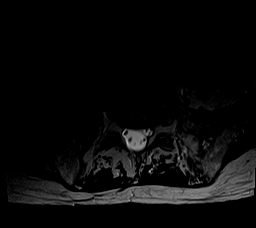
[im 13/41]
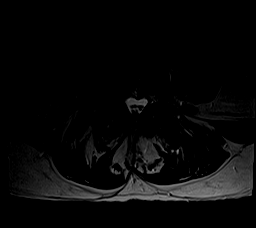
[im 19/41]
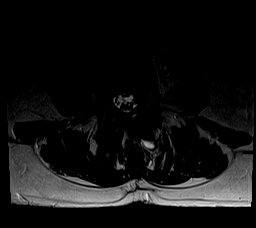
[im 22/41]
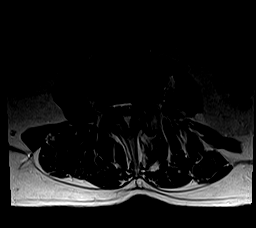
[im 28/41]
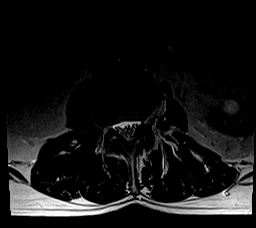
[im 34/41]
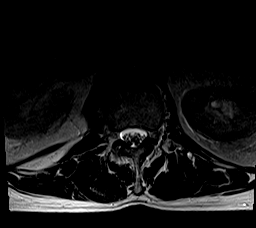
[im 41/41]
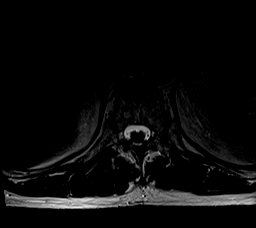

[Series 7: T1 · axial · 4.0mm · 0.35mm/px · z∈[-32,+124]mm · 3 of 40 slices shown (2 of 2)]
[im 7/40]
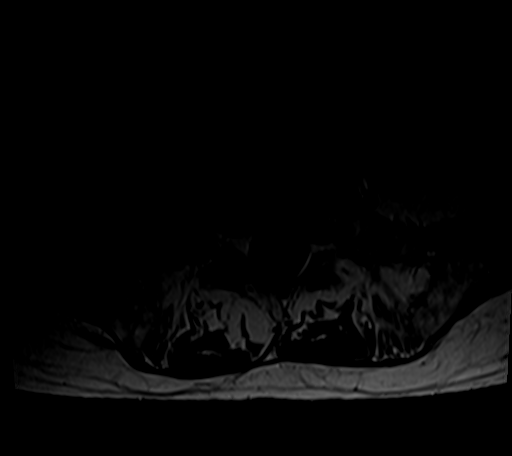
[im 22/40]
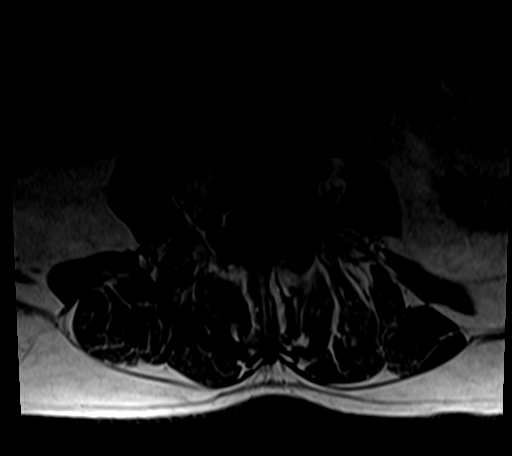
[im 34/40]
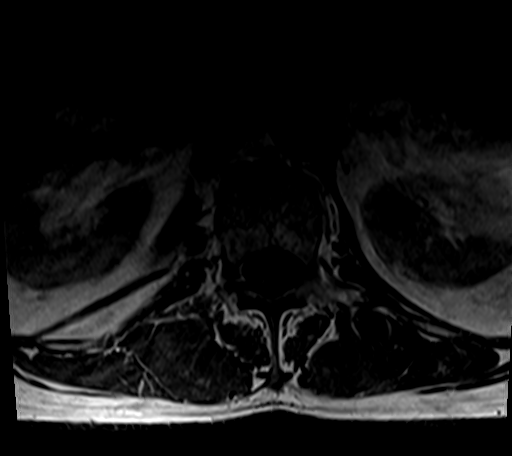

[24 of 48 positions shown; findings below may reference images not displayed]

FINDINGS: Segmentation: A transitional lumbosacral vertebra is assumed to
represent a sacralised L5 level. Careful correlation with this
numbering strategy prior to any procedural intervention would be
recommended.

Alignment: Dextroconvex scoliosis of the lumbar spine. Grade 1
anterolisthesis of L4 over L5, degenerative. Small retrolisthesis
from T12 through L3.

Vertebrae: No acute fracture, evidence of discitis, or bone lesion.
Mild chronic compression fracture of L4 with approximately 30% loss
of vertebral body height. Endplate degenerative changes at T12-L1,
L3-4 and L4-5.

Conus medullaris and cauda equina: Conus extends to the T12-L1
level. Conus and cauda equina appear normal.

Paraspinal and other soft tissues: Negative.

Disc levels:

T12-L1: Disc bulge with associated osteophytic component and facet
degenerative changes resulting in narrowing of the bilateral
subarticular zones, mild-to-moderate right and moderate left neural
foraminal narrowing.

L1-2: Disc bulge, facet degenerative changes and ligamentum flavum
redundancy resulting in mild spinal canal stenosis with effacement
of the bilateral subarticular zones, mild right and moderate left
neural foraminal narrowing.

L2-3: Left asymmetric disc bulge, facet degenerative changes and
ligamentum flavum redundancy resulting in moderate spinal canal
stenosis with narrowing of the bilateral subarticular zones,
moderate right and severe left neural foraminal narrowing.

L3-4: Right asymmetric disc bulge with superimposed central disc
extrusion migrating inferiorly, facet degenerative changes and
ligamentum flavum redundancy resulting in moderate to severe spinal
canal stenosis and severe bilateral neural foraminal.

L4-5: Disc bulge, prominent hypertrophic facet degenerative change
ligamentum flavum redundancy resulting in moderate spinal canal
stenosis with narrowing of the right subarticular zone and severe
right neural foraminal narrowing.

L5-S1: No spinal canal or neural foraminal stenosis.
IMPRESSION: 1. A transitional lumbosacral vertebra is assumed to represent a
sacralised L5 level. Careful correlation with this numbering
strategy prior to any procedural intervention would be recommended.
2. Multilevel degenerative changes of the lumbar spine with moderate
to severe spinal canal stenosis at L3-4 and moderate spinal canal
stenosis at L2-3 and L4-5.
3. Multilevel neural foraminal narrowing, severe on the left at
L2-3, bilaterally at L3-4 and on the right at L4-5.
4. Mild chronic compression fracture of L4 with approximately 30%
loss of vertebral body height.

## 2022-09-11 ENCOUNTER — Telehealth: Payer: Self-pay | Admitting: *Deleted

## 2022-09-11 DIAGNOSIS — M48061 Spinal stenosis, lumbar region without neurogenic claudication: Secondary | ICD-10-CM | POA: Diagnosis not present

## 2022-09-11 DIAGNOSIS — D62 Acute posthemorrhagic anemia: Secondary | ICD-10-CM | POA: Diagnosis not present

## 2022-09-11 DIAGNOSIS — F419 Anxiety disorder, unspecified: Secondary | ICD-10-CM | POA: Diagnosis not present

## 2022-09-11 DIAGNOSIS — M5416 Radiculopathy, lumbar region: Secondary | ICD-10-CM | POA: Diagnosis not present

## 2022-09-11 DIAGNOSIS — Z4789 Encounter for other orthopedic aftercare: Secondary | ICD-10-CM | POA: Diagnosis not present

## 2022-09-11 DIAGNOSIS — G8929 Other chronic pain: Secondary | ICD-10-CM | POA: Diagnosis not present

## 2022-09-11 DIAGNOSIS — N179 Acute kidney failure, unspecified: Secondary | ICD-10-CM | POA: Diagnosis not present

## 2022-09-11 DIAGNOSIS — M4185 Other forms of scoliosis, thoracolumbar region: Secondary | ICD-10-CM | POA: Diagnosis not present

## 2022-09-11 DIAGNOSIS — F32A Depression, unspecified: Secondary | ICD-10-CM | POA: Diagnosis not present

## 2022-09-11 NOTE — Progress Notes (Signed)
  Care Coordination Note  09/11/2022 Name: John Mcclain MRN: [id number] DOB: Nov 12, 1955  John Mcclain is a 67 y.o. year old male who is a primary care patient of Georgina Quint, MD and is actively engaged with the care management team. I reached out to John Mcclain by phone today to assist with re-scheduling a follow up visit with the RN Case Manager  Follow up plan: Unsuccessful telephone outreach attempt made. A HIPAA compliant phone message was left for the patient providing contact information and requesting a return call.   Burman Nieves, CCMA Care Coordination Care Guide Direct Dial: [phone]

## 2022-09-11 NOTE — Progress Notes (Signed)
  Care Coordination Note  09/11/2022 Name: [name] MRN: [id number] DOB: 12/12/1955  John Mcclain is a 67 y.o. year old male who is a primary care patient of Georgina Quint, MD and is actively engaged with the care management team. I reached out to [name] by phone today to assist with re-scheduling a follow up visit with the RN Case Manager  Follow up plan: Telephone appointment with care management team member scheduled for: 09/20/2022  Burman Nieves, [facility] Care Coordination Care Guide Direct Dial: [phone]

## 2022-09-15 ENCOUNTER — Other Ambulatory Visit: Payer: Self-pay | Admitting: Physical Medicine and Rehabilitation

## 2022-09-15 ENCOUNTER — Other Ambulatory Visit: Payer: Self-pay | Admitting: Emergency Medicine

## 2022-09-15 DIAGNOSIS — F419 Anxiety disorder, unspecified: Secondary | ICD-10-CM

## 2022-09-20 ENCOUNTER — Ambulatory Visit: Payer: Self-pay

## 2022-09-20 NOTE — Patient Instructions (Signed)
Visit Information  Thank you for taking time to visit with me today. Please don't hesitate to contact me if I can be of assistance to you.   Following are the goals we discussed today:   Goals Addressed             This Visit's Progress    continue to improve post hospitalization       Interventions Today    Flowsheet Row Most Recent Value  Chronic Disease   Chronic disease during today's visit Other  General Interventions   General Interventions Discussed/Reviewed General Interventions Discussed, Doctor Visits  Doctor Visits Discussed/Reviewed Doctor Visits Reviewed, Doctor Visits Discussed, PCP  PCP/Specialist Visits Compliance with follow-up visit  [discussed upcoming scheduled visits: Dr. Jake Samples 10/09/22 surgeon,  Dr. Hilton Cork rheumatologist 10/20/22]  Exercise Interventions   Exercise Discussed/Reviewed Exercise Discussed  [confirmed patient has been active with home health PT. he reports he has been released by PT and is completing exercised recommended by therapist.]  Education Interventions   Education Provided Provided Education  Provided Verbal Education On Exercise, When to see the doctor  [discussed importance of performed recommended exercises in maintaing muscle strength and tone. advised if any worsening or concerns regarding health condition to contact providers]  Pharmacy Interventions   Pharmacy Dicussed/Reviewed Pharmacy Topics Discussed, Medications and their functions, Medication Adherence, Affording Medications  [patient reports has Eliquis and is taking as prescribed. He states he is off his rheumatoid arthritis medication per provider recommendation until June 2024.]            Our next appointment is by telephone on 10/23/22 at 2:30 pm  Please call the care guide team at [phone] if you need to cancel or reschedule your appointment.   If you are experiencing a Mental Health or Behavioral Health Crisis or need someone to talk to, please call the Suicide and  Crisis Lifeline: 53  Kathyrn Sheriff, RN, MSN, BSN, CCM [facility] Care Coordinator [phone]

## 2022-09-20 NOTE — Patient Outreach (Signed)
  Care Coordination   Initial Visit Note   09/20/2022 Name: Dracen Fluharty MRN: [id number] DOB: 1955-10-24  Philemon Michalak is a 67 y.o. year old male who sees Sagardia, Eilleen Kempf, MD for primary care. I spoke with  [name] by phone today.  What matters to the patients health and wellness today?  Hospitalization 08/04/22-08/21/22-spinal surgery on 08/04/22 developed LLE DVT-started on Eliquis in hospital. Mr. Sunga reports he is improving. He reports he is wearing compression hose as recommended. He has followed up with PCP and surgeon. He reports improved pain states about a "2/10" on pain scale at this time.   Goals Addressed             This Visit's Progress    continue to improve post hospitalization       Interventions Today    Flowsheet Row Most Recent Value  Chronic Disease   Chronic disease during today's visit Other  General Interventions   General Interventions Discussed/Reviewed General Interventions Discussed, Doctor Visits  Doctor Visits Discussed/Reviewed Doctor Visits Reviewed, Doctor Visits Discussed, PCP  PCP/Specialist Visits Compliance with follow-up visit  [discussed upcoming scheduled visits: Dr. Jake Samples 10/09/22 surgeon,  Dr. Hilton Cork rheumatologist 10/20/22]  Exercise Interventions   Exercise Discussed/Reviewed Exercise Discussed  [confirmed patient has been active with home health PT. he reports he has been released by PT and is completing exercised recommended by therapist.]  Education Interventions   Education Provided Provided Education  Provided Verbal Education On Exercise, When to see the doctor  [discussed importance of performed recommended exercises in maintaing muscle strength and tone. advised if any worsening or concerns regarding health condition to contact providers]  Pharmacy Interventions   Pharmacy Dicussed/Reviewed Pharmacy Topics Discussed, Medications and their functions, Medication Adherence, Affording Medications  [patient reports has Eliquis and  is taking as prescribed. He states he is off his rheumatoid arthritis medication per provider recommendation until June 2024.]            SDOH assessments and interventions completed:  Yes RNCM confirmed no changes.   Care Coordination Interventions:  Yes, provided   Follow up plan: Follow up call scheduled for 10/23/22    Encounter Outcome:  Pt. Visit Completed   Kathyrn Sheriff, RN, MSN, BSN, CCM [facility] Care Coordinator [phone]

## 2022-10-23 ENCOUNTER — Ambulatory Visit: Payer: Self-pay

## 2022-10-23 NOTE — Patient Outreach (Signed)
  Care Coordination   Follow Up Visit Note   10/23/2022 Name: John Mcclain MRN: [id number] DOB: 04-11-1956  John Mcclain is a 67 y.o. year old male who sees Sagardia, Eilleen Kempf, MD for primary care. I spoke with  John Mcclain by phone today.  What matters to the patients health and wellness today?  08/04/22-08/21/22 Spinal surgery. Mr. John Mcclain reports he is better. He reports continues to follow up with providers as recommended. He denies any questions or care coordination needs at this time. Patient to contact RN Care Coordinator if care coordination needs in the future.  Goals Addressed             This Visit's Progress    COMPLETED: continue to improve post hospitalization       Interventions Today    Flowsheet Row Most Recent Value  Chronic Disease   Chronic disease during today's visit Other  [Post hospitalization-spinal surgery]  General Interventions   General Interventions Discussed/Reviewed General Interventions Reviewed, Doctor Visits  Doctor Visits Discussed/Reviewed Doctor Visits Reviewed, Specialist  PCP/Specialist Visits Compliance with follow-up visit  [reports has follow up with Dr. Jake Mcclain on 02/07/23]  Education Interventions   Education Provided Provided Education  [advised to contact RNCM if care coordination needs in the future]  Provided Verbal Education On Other  [advised to continue to take medications as prescribed, follow up with provider as recommended. contact provider with health questions or concerns.]  Pharmacy Interventions   Pharmacy Dicussed/Reviewed Pharmacy Topics Reviewed            SDOH assessments and interventions completed:  No  Care Coordination Interventions:  Yes, provided   Follow up plan: No further intervention required.   Encounter Outcome:  Pt. Visit Completed   John Sheriff, RN, MSN, BSN, CCM [facility] Care Coordinator [phone]

## 2022-10-23 NOTE — Patient Instructions (Signed)
Visit Information  Thank you for taking time to visit with me today. Please don't hesitate to contact me if I can be of assistance to you.   Following are the goals we discussed today:  Continue to take medications as prescribed. Continue to attend provider visits as scheduled Continue to eat healthy, lean meats, vegetables, fruits, avoid saturated and transfats Contact provider with health questions or concerns   Please call the care guide team at [phone] if you need to cancel or reschedule your appointment.   If you are experiencing a Mental Health or Behavioral Health Crisis or need someone to talk to, please call the Suicide and Crisis Lifeline: 31  Kathyrn Sheriff, RN, MSN, BSN, CCM [facility] Care Coordinator [phone]

## 2022-11-10 DIAGNOSIS — M549 Dorsalgia, unspecified: Secondary | ICD-10-CM | POA: Diagnosis not present

## 2022-11-10 DIAGNOSIS — M1990 Unspecified osteoarthritis, unspecified site: Secondary | ICD-10-CM | POA: Diagnosis not present

## 2022-11-10 DIAGNOSIS — R768 Other specified abnormal immunological findings in serum: Secondary | ICD-10-CM | POA: Diagnosis not present

## 2022-11-10 DIAGNOSIS — Z79899 Other long term (current) drug therapy: Secondary | ICD-10-CM | POA: Diagnosis not present

## 2022-11-10 DIAGNOSIS — M0609 Rheumatoid arthritis without rheumatoid factor, multiple sites: Secondary | ICD-10-CM | POA: Diagnosis not present

## 2022-11-10 DIAGNOSIS — M810 Age-related osteoporosis without current pathological fracture: Secondary | ICD-10-CM | POA: Diagnosis not present

## 2022-11-23 DIAGNOSIS — G43719 Chronic migraine without aura, intractable, without status migrainosus: Secondary | ICD-10-CM | POA: Diagnosis not present

## 2022-11-28 ENCOUNTER — Other Ambulatory Visit: Payer: Self-pay | Admitting: Vascular Surgery

## 2022-11-28 ENCOUNTER — Other Ambulatory Visit: Payer: Self-pay | Admitting: Emergency Medicine

## 2022-11-28 DIAGNOSIS — F419 Anxiety disorder, unspecified: Secondary | ICD-10-CM

## 2022-12-13 ENCOUNTER — Other Ambulatory Visit: Payer: Self-pay

## 2022-12-13 MED ORDER — APIXABAN 5 MG PO TABS: 5.0000 mg | ORAL_TABLET | Freq: Two times a day (BID) | ORAL | 0 refills | Status: DC

## 2023-01-01 ENCOUNTER — Other Ambulatory Visit: Payer: Self-pay | Admitting: Physical Medicine and Rehabilitation

## 2023-01-28 ENCOUNTER — Other Ambulatory Visit: Payer: Self-pay | Admitting: Emergency Medicine

## 2023-01-28 DIAGNOSIS — F419 Anxiety disorder, unspecified: Secondary | ICD-10-CM

## 2023-02-12 DIAGNOSIS — Z79899 Other long term (current) drug therapy: Secondary | ICD-10-CM | POA: Diagnosis not present

## 2023-02-12 DIAGNOSIS — M810 Age-related osteoporosis without current pathological fracture: Secondary | ICD-10-CM | POA: Diagnosis not present

## 2023-02-12 DIAGNOSIS — M25539 Pain in unspecified wrist: Secondary | ICD-10-CM | POA: Diagnosis not present

## 2023-02-12 DIAGNOSIS — M0609 Rheumatoid arthritis without rheumatoid factor, multiple sites: Secondary | ICD-10-CM | POA: Diagnosis not present

## 2023-02-12 DIAGNOSIS — R768 Other specified abnormal immunological findings in serum: Secondary | ICD-10-CM | POA: Diagnosis not present

## 2023-02-12 DIAGNOSIS — M1990 Unspecified osteoarthritis, unspecified site: Secondary | ICD-10-CM | POA: Diagnosis not present

## 2023-02-12 DIAGNOSIS — M48061 Spinal stenosis, lumbar region without neurogenic claudication: Secondary | ICD-10-CM | POA: Diagnosis not present

## 2023-02-12 DIAGNOSIS — M25531 Pain in right wrist: Secondary | ICD-10-CM | POA: Diagnosis not present

## 2023-02-19 DIAGNOSIS — Z6826 Body mass index (BMI) 26.0-26.9, adult: Secondary | ICD-10-CM | POA: Diagnosis not present

## 2023-02-19 DIAGNOSIS — M4326 Fusion of spine, lumbar region: Secondary | ICD-10-CM | POA: Diagnosis not present

## 2023-02-19 DIAGNOSIS — M4126 Other idiopathic scoliosis, lumbar region: Secondary | ICD-10-CM | POA: Diagnosis not present

## 2023-04-20 ENCOUNTER — Other Ambulatory Visit: Payer: Self-pay | Admitting: Emergency Medicine

## 2023-04-20 DIAGNOSIS — F419 Anxiety disorder, unspecified: Secondary | ICD-10-CM

## 2023-05-01 DIAGNOSIS — R768 Other specified abnormal immunological findings in serum: Secondary | ICD-10-CM | POA: Diagnosis not present

## 2023-05-01 DIAGNOSIS — M25531 Pain in right wrist: Secondary | ICD-10-CM | POA: Diagnosis not present

## 2023-05-01 DIAGNOSIS — M0609 Rheumatoid arthritis without rheumatoid factor, multiple sites: Secondary | ICD-10-CM | POA: Diagnosis not present

## 2023-05-01 DIAGNOSIS — M810 Age-related osteoporosis without current pathological fracture: Secondary | ICD-10-CM | POA: Diagnosis not present

## 2023-05-01 DIAGNOSIS — M1990 Unspecified osteoarthritis, unspecified site: Secondary | ICD-10-CM | POA: Diagnosis not present

## 2023-05-01 DIAGNOSIS — Z79899 Other long term (current) drug therapy: Secondary | ICD-10-CM | POA: Diagnosis not present

## 2023-05-01 DIAGNOSIS — M48061 Spinal stenosis, lumbar region without neurogenic claudication: Secondary | ICD-10-CM | POA: Diagnosis not present

## 2023-05-23 DIAGNOSIS — G43719 Chronic migraine without aura, intractable, without status migrainosus: Secondary | ICD-10-CM | POA: Diagnosis not present

## 2023-05-26 ENCOUNTER — Other Ambulatory Visit: Payer: Self-pay | Admitting: Physical Medicine and Rehabilitation

## 2023-07-19 ENCOUNTER — Other Ambulatory Visit: Payer: Self-pay | Admitting: Emergency Medicine

## 2023-07-19 DIAGNOSIS — F419 Anxiety disorder, unspecified: Secondary | ICD-10-CM

## 2023-08-13 ENCOUNTER — Other Ambulatory Visit: Payer: Self-pay | Admitting: Gastroenterology

## 2023-08-24 DIAGNOSIS — M4126 Other idiopathic scoliosis, lumbar region: Secondary | ICD-10-CM | POA: Diagnosis not present

## 2023-08-24 DIAGNOSIS — Z6826 Body mass index (BMI) 26.0-26.9, adult: Secondary | ICD-10-CM | POA: Diagnosis not present

## 2023-08-28 DIAGNOSIS — Z79899 Other long term (current) drug therapy: Secondary | ICD-10-CM | POA: Diagnosis not present

## 2023-08-28 DIAGNOSIS — M1990 Unspecified osteoarthritis, unspecified site: Secondary | ICD-10-CM | POA: Diagnosis not present

## 2023-08-28 DIAGNOSIS — G5600 Carpal tunnel syndrome, unspecified upper limb: Secondary | ICD-10-CM | POA: Diagnosis not present

## 2023-08-28 DIAGNOSIS — R768 Other specified abnormal immunological findings in serum: Secondary | ICD-10-CM | POA: Diagnosis not present

## 2023-08-28 DIAGNOSIS — M25539 Pain in unspecified wrist: Secondary | ICD-10-CM | POA: Diagnosis not present

## 2023-08-28 DIAGNOSIS — M48061 Spinal stenosis, lumbar region without neurogenic claudication: Secondary | ICD-10-CM | POA: Diagnosis not present

## 2023-08-28 DIAGNOSIS — M0609 Rheumatoid arthritis without rheumatoid factor, multiple sites: Secondary | ICD-10-CM | POA: Diagnosis not present

## 2023-08-28 DIAGNOSIS — M810 Age-related osteoporosis without current pathological fracture: Secondary | ICD-10-CM | POA: Diagnosis not present

## 2023-09-26 DIAGNOSIS — R202 Paresthesia of skin: Secondary | ICD-10-CM | POA: Diagnosis not present

## 2023-09-26 DIAGNOSIS — R200 Anesthesia of skin: Secondary | ICD-10-CM | POA: Diagnosis not present

## 2023-10-04 ENCOUNTER — Ambulatory Visit: Admitting: Emergency Medicine

## 2023-10-04 ENCOUNTER — Encounter: Payer: Self-pay | Admitting: Physician Assistant

## 2023-10-04 ENCOUNTER — Ambulatory Visit: Admitting: Physician Assistant

## 2023-10-04 ENCOUNTER — Telehealth: Payer: Self-pay | Admitting: Physician Assistant

## 2023-10-04 ENCOUNTER — Encounter: Payer: Self-pay | Admitting: Emergency Medicine

## 2023-10-04 VITALS — BP 162/90 | HR 91 | Temp 98.2°F | Ht 65.0 in | Wt 162.0 lb

## 2023-10-04 VITALS — BP 148/82 | HR 91 | Ht 65.0 in | Wt 163.0 lb

## 2023-10-04 DIAGNOSIS — M069 Rheumatoid arthritis, unspecified: Secondary | ICD-10-CM | POA: Diagnosis not present

## 2023-10-04 DIAGNOSIS — I10 Essential (primary) hypertension: Secondary | ICD-10-CM

## 2023-10-04 DIAGNOSIS — Z86718 Personal history of other venous thrombosis and embolism: Secondary | ICD-10-CM

## 2023-10-04 DIAGNOSIS — R1310 Dysphagia, unspecified: Secondary | ICD-10-CM

## 2023-10-04 DIAGNOSIS — K649 Unspecified hemorrhoids: Secondary | ICD-10-CM | POA: Diagnosis not present

## 2023-10-04 DIAGNOSIS — F419 Anxiety disorder, unspecified: Secondary | ICD-10-CM

## 2023-10-04 DIAGNOSIS — K625 Hemorrhage of anus and rectum: Secondary | ICD-10-CM | POA: Diagnosis not present

## 2023-10-04 DIAGNOSIS — K6289 Other specified diseases of anus and rectum: Secondary | ICD-10-CM

## 2023-10-04 DIAGNOSIS — Z860101 Personal history of adenomatous and serrated colon polyps: Secondary | ICD-10-CM | POA: Diagnosis not present

## 2023-10-04 DIAGNOSIS — K219 Gastro-esophageal reflux disease without esophagitis: Secondary | ICD-10-CM | POA: Diagnosis not present

## 2023-10-04 DIAGNOSIS — K429 Umbilical hernia without obstruction or gangrene: Secondary | ICD-10-CM | POA: Insufficient documentation

## 2023-10-04 DIAGNOSIS — Z9889 Other specified postprocedural states: Secondary | ICD-10-CM

## 2023-10-04 MED ORDER — LISINOPRIL 20 MG PO TABS: 20.0000 mg | ORAL_TABLET | Freq: Every day | ORAL | 3 refills | Status: DC

## 2023-10-04 MED ORDER — AMLODIPINE BESYLATE 5 MG PO TABS: 5.0000 mg | ORAL_TABLET | Freq: Every day | ORAL | 3 refills | Status: DC

## 2023-10-04 MED ORDER — HYDROCORTISONE ACETATE 25 MG RE SUPP: 25.0000 mg | Freq: Two times a day (BID) | RECTAL | 0 refills | Status: DC

## 2023-10-04 MED ORDER — LORAZEPAM 1 MG PO TABS: ORAL_TABLET | ORAL | 1 refills | Status: DC

## 2023-10-04 NOTE — Progress Notes (Signed)
 10/04/2023 John Mcclain [id number] Jul 15, 1955  Referring provider: Elvira Hammersmith, * Primary GI doctor: Dr. Karene Oto  ASSESSMENT AND PLAN:  Hemorrhoids with intermittent bright red blood per rectum and rectal itching -05/19/2021: Banding of the LL hemorrhoid - 06/21/2021: Banding of the RP hemorrhoid - 07/26/2021: banding of RA hemorrhoid On miralax  daily capful, an be too soft at times so occ 1/2 capful, he is not on a fiber Right posterior hemorrhoid large, possible white/scar tissue on anoscopy, normal DRE no masses -Will set up with soonest available hemorrhoid banding with Dr. Karene Oto, may need to consider flex sig  -Sitz baths, increase fiber, increase water -Hydrocortisone  supp  -We discussed hemorrhoid banding here in the office for internal hemorrhoids if not improving with conservative treatment.  GERD with history of dysphagia, has had slight dysphagia EGD 09/2020 esophageal dilation 54 [place] Maloney position of dysphagia On pantoprazole  40 mg once daily Intermittent dysphagia, worse with spicy foods Given information about GERD Continue pantoprazole , take 30 mins an hour before food Consider adding pepcid as needed Repeat EGD with dilatation if worsening symptoms or consistent  Personal history of colon polyps "Grapelike clusters" in the rectum removed in 1980's Colonoscopy 09/2020 5 mm sigmoid TA polyp, 2 rectal hyperplastic polyps, post polypectomy scar without residual polyp in rectum, sigmoid tics, internal hemorrhoids Recall 09/2025  Umblical hernia  Some tenderness to palpation PCP referred to [facility] Given information about hernias  Discussed ER precautions Weight loss advised.   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 male with a past medical history listed below presents for evaluation of hemorrhoids.  Patient follows with Dr. Karene Oto last seen in 2023 for hemorrhoidal banding x 3.  Discussed the use of AI scribe software for clinical note transcription with the patient, who gave verbal consent to proceed.  History of Present Illness   John Mcclain "John Mcclain" is a 68 year old male with hemorrhoids and diverticulosis who presents for evaluation of hemorrhoidal symptoms.  He has undergone hemorrhoidal banding procedures in December 2022, January 2023, and February 2023. The first two procedures were successful, but he is uncertain about the outcome of the third due to lack of follow-up. He experiences occasional rectal discomfort, minimal bleeding, and clear fluid leakage after bowel movements, which he manages by using a barrier such as toilet paper. No significant rectal pain, burning, or itching.  He has a history of diverticulosis and underwent a colonoscopy in April 2022, which revealed a small polyp and a polypectomy scar from a previous larger polyp. He is not due for another colonoscopy until 2027. He takes Miralax  daily to manage his bowel movements, adjusting the dose as needed to prevent overly loose stools. No significant abdominal pain, heartburn, or swallowing difficulties, although he experienced some transient swallowing issues after a throat stretching procedure in 2022.  He is currently on Rinvoq for rheumatoid arthritis and Ajovy injections for migraines. His rheumatoid arthritis is stable, but he is experiencing carpal tunnel syndrome in both wrists, with plans for surgical intervention on the right hand due to numbness in his fingers.  He had back surgery in March 2024, which involved the placement of a mesh on  his tailbone and replacement of L1 through L5 discs with rods and  screws. Post-surgery, he developed a DVT and was treated with Eliquis , which he is no longer taking. No current numbness or tingling in his legs.  He has a small umbilical hernia that is sore to touch, which he has had for about three years. He recently saw his family doctor due to fluctuating blood pressure. No shortness of breath, chest pain, nausea, vomiting, fevers, or chills. He experiences occasional dizziness, which he attributes to blood pressure issues or possibly inner ear problems.      He  reports that he has never smoked. He has never used smokeless tobacco. He reports that he does not currently use alcohol. He reports that he does not use drugs.  RELEVANT GI HISTORY, IMAGING AND LABS: Results   Procedure: Anoscopy Description: Anoscope inserted into the rectum. No fissures or external hemorrhoids observed. Internal hemorrhoids present, particularly a large right posterior hemorrhoid.  DIAGNOSTIC Colonoscopy: Small polyp, polypectomy scar from previous larger polyp (09/2020) Endoscopy: Throat stretching (2022)     - Colonoscopy (09/2009): Large hemorrhoids, otherwise normal.  Repeat in 5 years due to prior history of polyps - EGD (09/2020, Dr. Karene Oto): Esophageal longitudinal furrows (biopsies negative for EOE) dilated with 54 [place] Maloney with mucosal rent at 16 cm and at the GEJ.  10 mm gastric ulcer, moderate gastritis (path benign).  Treated with Protonix  40 mg bid and Carafate  - Colonoscopy (09/2020, Dr. Karene Oto): 5 mm sigmoid polyp (TA), 2 rectal hyperplastic polyps, post polypectomy scar without residual polyp in rectum, sigmoid diverticulosis, internal hemorrhoids.  Repeat 5 years - EGD (10/2020, Dr. Karene Oto): Normal esophagus, mild non-H. pylori gastritis CBC    Component Value Date/Time   WBC 10.0 08/21/2022 0426   RBC 3.18 (L) 08/21/2022 0426   HGB 9.4 (L) 08/21/2022 0426   HGB 16.4 11/12/2018 1636   HCT 29.5 (L) 08/21/2022 0426   HCT 48.3 11/12/2018 1636    PLT 314 08/21/2022 0426   PLT 290 11/12/2018 1636   MCV 92.8 08/21/2022 0426   MCV 102 (H) 11/12/2018 1636   MCH 29.6 08/21/2022 0426   MCHC 31.9 08/21/2022 0426   RDW 14.9 08/21/2022 0426   RDW 14.4 11/12/2018 1636   LYMPHSABS 1.0 08/17/2022 0732   LYMPHSABS 2.2 11/12/2018 1636   MONOABS 1.8 (H) 08/17/2022 0732   EOSABS 0.2 08/17/2022 0732   EOSABS 0.0 11/12/2018 1636   BASOSABS 0.1 08/17/2022 0732   BASOSABS 0.0 11/12/2018 1636   No results for input(s): "HGB" in the last 8760 hours.  CMP     Component Value Date/Time   NA 135 08/18/2022 0431   NA 141 07/13/2020 1613   K 3.7 08/18/2022 0431   CL 105 08/18/2022 0431   CO2 24 08/18/2022 0431   GLUCOSE 134 (H) 08/18/2022 0431   BUN 9 08/18/2022 0431   BUN 10 07/13/2020 1613   CREATININE 0.98 08/18/2022 0431   CREATININE 1.07 11/30/2015 1441   CALCIUM  7.6 (L) 08/18/2022 0431   PROT 6.9 01/11/2021 1632   PROT 6.3 07/13/2020 1613   ALBUMIN  4.3 01/11/2021 1632   ALBUMIN  4.4 07/13/2020 1613   AST 24 01/11/2021 1632   ALT 18 01/11/2021 1632   ALKPHOS 26 (L) 01/11/2021 1632   BILITOT 0.5 01/11/2021 1632   BILITOT 0.5 07/13/2020 1613   GFRNONAA >60 08/18/2022 0431   GFRNONAA 74 11/30/2015 1441   GFRAA 80 07/13/2020 1613   GFRAA 86 11/30/2015 1441  Latest Ref Rng & Units 01/11/2021    4:32 PM 07/13/2020    4:13 PM 11/12/2018    4:36 PM  Hepatic Function  Total Protein 6.0 - 8.3 g/dL 6.9  6.3  6.9   Albumin  3.5 - 5.2 g/dL 4.3  4.4  4.8   AST 0 - 37 U/L 24  20  31    ALT 0 - 53 U/L 18  13  29    Alk Phosphatase 39 - 117 U/L 26  29  30    Total Bilirubin 0.2 - 1.2 mg/dL 0.5  0.5  0.4       Current Medications:   Current Outpatient Medications (Endocrine & Metabolic):    alendronate (FOSAMAX) 70 MG tablet, Take 70 mg by mouth every Saturday. Take with a full glass of water on an empty stomach.  Current Outpatient Medications (Cardiovascular):    amLODipine  (NORVASC ) 5 MG tablet, Take 1 tablet (5 mg total) by mouth  daily.   lisinopril  (ZESTRIL ) 20 MG tablet, Take 1 tablet (20 mg total) by mouth daily.   rosuvastatin  (CRESTOR ) 5 MG tablet, TAKE 1 TABLET EVERY DAY   Current Outpatient Medications (Analgesics):    acetaminophen  (TYLENOL ) 500 MG tablet, Take 500 mg by mouth every 6 (six) hours as needed for moderate pain.   Fremanezumab-vfrm (AJOVY) 225 MG/1.5ML SOAJ, Inject 225 mg into the skin every 30 (thirty) days.   Upadacitinib ER (RINVOQ) 15 MG TB24, Take 15 mg by mouth daily.   Current Outpatient Medications (Other):    Calcium  Carbonate-Vitamin D  (CALCIUM  600+D PO), Take 1 tablet by mouth daily.   clobetasol  (OLUX ) 0.05 % topical foam, Apply topically 2 (two) times daily. (Patient taking differently: Apply 1 Application topically daily as needed (psoriasis).)   cyclobenzaprine (FLEXERIL) 10 MG tablet, Take 10 mg by mouth 3 (three) times daily as needed for muscle spasms.   hydrocortisone  (ANUSOL -HC) 25 MG suppository, Place 1 suppository (25 mg total) rectally 2 (two) times daily.   levETIRAcetam  (KEPPRA ) 750 MG tablet, Take 750 mg by mouth 2 (two) times daily.    LORazepam  (ATIVAN ) 1 MG tablet, TAKE 1/2 TO 1 TABLET EVERY DAY AS NEEDED FOR ANXIETY AS SPARINGLY AS POSSIBLE   Magnesium 400 MG TABS, Take 400 mg by mouth every evening.   pantoprazole  (PROTONIX ) 40 MG tablet, Take 1 tablet (40 mg total) by mouth daily. Patient needs follow up appointment for future refills. Please call [phone] to schedule an appointment.   polyethylene glycol powder (GLYCOLAX /MIRALAX ) powder, Take 17 g by mouth daily.  Medical History:  Past Medical History:  Diagnosis Date   Allergy 1961   Anxiety    Arthritis    RA   Back pain    Blood transfusion without reported diagnosis 08/14/2022   Chronic migraine    Depression    GERD (gastroesophageal reflux disease)    Heart murmur    History of kidney stones    Hypertension    Pneumonia    Raynaud's disease    Rheumatoid arthritis (HCC)    Ulcer     Allergies:  Allergies  Allergen Reactions   Penicillins Rash     Surgical History:  He  has a past surgical history that includes Colon surgery (2000); Vasectomy; Lymph node dissection; Colonoscopy with esophagogastroduodenoscopy (egd) (2023); Esophageal dilation (2023); Anterior lat lumbar fusion (N/A, 08/04/2022); Anterior lat lumbar fusion (N/A, 08/04/2022); Abdominal exposure (N/A, 08/04/2022); Laminectomy with posterior lateral arthrodesis level 3 (N/A, 08/08/2022); and Spine surgery (06/05/22&06/09/22). Family History:  His family  history includes Alzheimer's disease in his paternal grandmother; Arthritis in his mother; COPD in his father and mother; Diabetes in his father; Heart attack in his father; Heart disease in his father; Hyperlipidemia in his mother; Hypertension in his father and mother; Kidney disease in his mother; Migraines in his brother; Stroke in his maternal grandfather.  REVIEW OF SYSTEMS  : All other systems reviewed and negative except where noted in the History of Present Illness.  PHYSICAL EXAM: BP (!) 148/82   Pulse 91   Ht 5\' 5"  (1.651 m)   Wt 163 lb (73.9 kg)   SpO2 96%   BMI 27.12 kg/m  Physical Exam   GENERAL APPEARANCE: Well nourished, in no apparent distress. HEENT: No cervical lymphadenopathy, unremarkable thyroid, sclerae anicteric, conjunctiva pink. RESPIRATORY: Respiratory effort normal, breath sounds equal bilaterally without rales, rhonchi, or wheezing. CARDIO: Regular rate and rhythm with no murmurs, rubs, or gallops, peripheral pulses intact. ABDOMEN: Soft, non-distended, active bowel sounds in all four quadrants, no tenderness to palpation, no rebound, no mass appreciated. RECTAL/Anoscopy: No anal fissures or external hemorrhoids. Large right posterior hemorrhoid, some white appearing tissue behind the hemorrhoid, non tender, not appreciated on DRE, unable to wipe away with a Q tip. No rectal masses. Decreased rectal tone. MUSCULOSKELETAL: Full  range of motion, normal gait, without edema. SKIN: Dry, intact without rashes or lesions. No jaundice. NEURO: Alert, oriented, no focal deficits. PSYCH: Cooperative, normal mood and affect.      Edmonia Gottron, PA-C 3:30 PM

## 2023-10-04 NOTE — Assessment & Plan Note (Signed)
 Stable and currently on treatment guided by rheumatologist

## 2023-10-04 NOTE — Assessment & Plan Note (Signed)
 Stable on current medications. Takes lorazepam  as needed

## 2023-10-04 NOTE — Patient Instructions (Signed)
 Hypertension, Adult High blood pressure (hypertension) is when the force of blood pumping through the arteries is too strong. The arteries are the blood vessels that carry blood from the heart throughout the body. Hypertension forces the heart to work harder to pump blood and may cause arteries to become narrow or stiff. Untreated or uncontrolled hypertension can lead to a heart attack, heart failure, a stroke, kidney disease, and other problems. A blood pressure reading consists of a higher number over a lower number. Ideally, your blood pressure should be below 120/80. The first ("top") number is called the systolic pressure. It is a measure of the pressure in your arteries as your heart beats. The second ("bottom") number is called the diastolic pressure. It is a measure of the pressure in your arteries as the heart relaxes. What are the causes? The exact cause of this condition is not known. There are some conditions that result in high blood pressure. What increases the risk? Certain factors may make you more likely to develop high blood pressure. Some of these risk factors are under your control, including: Smoking. Not getting enough exercise or physical activity. Being overweight. Having too much fat, sugar, calories, or salt (sodium) in your diet. Drinking too much alcohol. Other risk factors include: Having a personal history of heart disease, diabetes, high cholesterol, or kidney disease. Stress. Having a family history of high blood pressure and high cholesterol. Having obstructive sleep apnea. Age. The risk increases with age. What are the signs or symptoms? High blood pressure may not cause symptoms. Very high blood pressure (hypertensive crisis) may cause: Headache. Fast or irregular heartbeats (palpitations). Shortness of breath. Nosebleed. Nausea and vomiting. Vision changes. Severe chest pain, dizziness, and seizures. How is this diagnosed? This condition is diagnosed by  measuring your blood pressure while you are seated, with your arm resting on a flat surface, your legs uncrossed, and your feet flat on the floor. The cuff of the blood pressure monitor will be placed directly against the skin of your upper arm at the level of your heart. Blood pressure should be measured at least twice using the same arm. Certain conditions can cause a difference in blood pressure between your right and left arms. If you have a high blood pressure reading during one visit or you have normal blood pressure with other risk factors, you may be asked to: Return on a different day to have your blood pressure checked again. Monitor your blood pressure at home for 1 week or longer. If you are diagnosed with hypertension, you may have other blood or imaging tests to help your health care provider understand your overall risk for other conditions. How is this treated? This condition is treated by making healthy lifestyle changes, such as eating healthy foods, exercising more, and reducing your alcohol intake. You may be referred for counseling on a healthy diet and physical activity. Your health care provider may prescribe medicine if lifestyle changes are not enough to get your blood pressure under control and if: Your systolic blood pressure is above 130. Your diastolic blood pressure is above 80. Your personal target blood pressure may vary depending on your medical conditions, your age, and other factors. Follow these instructions at home: Eating and drinking  Eat a diet that is high in fiber and potassium, and low in sodium, added sugar, and fat. An example of this eating plan is called the DASH diet. DASH stands for Dietary Approaches to Stop Hypertension. To eat this way: Eat  plenty of fresh fruits and vegetables. Try to fill one half of your plate at each meal with fruits and vegetables. Eat whole grains, such as whole-wheat pasta, brown rice, or whole-grain bread. Fill about one  fourth of your plate with whole grains. Eat or drink low-fat dairy products, such as skim milk or low-fat yogurt. Avoid fatty cuts of meat, processed or cured meats, and poultry with skin. Fill about one fourth of your plate with lean proteins, such as fish, chicken without skin, beans, eggs, or tofu. Avoid pre-made and processed foods. These tend to be higher in sodium, added sugar, and fat. Reduce your daily sodium intake. Many people with hypertension should eat less than 1,500 mg of sodium a day. Do not drink alcohol if: Your health care provider tells you not to drink. You are pregnant, may be pregnant, or are planning to become pregnant. If you drink alcohol: Limit how much you have to: 0-1 drink a day for women. 0-2 drinks a day for men. Know how much alcohol is in your drink. In the [place] one drink equals one 12 oz bottle of beer (355 mL), one 5 oz glass of wine (148 mL), or one 1 oz glass of hard liquor (44 mL). Lifestyle  Work with your health care provider to maintain a healthy body weight or to lose weight. Ask what an ideal weight is for you. Get at least 30 minutes of exercise that causes your heart to beat faster (aerobic exercise) most days of the week. Activities may include walking, swimming, or biking. Include exercise to strengthen your muscles (resistance exercise), such as Pilates or lifting weights, as part of your weekly exercise routine. Try to do these types of exercises for 30 minutes at least 3 days a week. Do not use any products that contain nicotine or tobacco. These products include cigarettes, chewing tobacco, and vaping devices, such as e-cigarettes. If you need help quitting, ask your health care provider. Monitor your blood pressure at home as told by your health care provider. Keep all follow-up visits. This is important. Medicines Take over-the-counter and prescription medicines only as told by your health care provider. Follow directions carefully. Blood  pressure medicines must be taken as prescribed. Do not skip doses of blood pressure medicine. Doing this puts you at risk for problems and can make the medicine less effective. Ask your health care provider about side effects or reactions to medicines that you should watch for. Contact a health care provider if you: Think you are having a reaction to a medicine you are taking. Have headaches that keep coming back (recurring). Feel dizzy. Have swelling in your ankles. Have trouble with your vision. Get help right away if you: Develop a severe headache or confusion. Have unusual weakness or numbness. Feel faint. Have severe pain in your chest or abdomen. Vomit repeatedly. Have trouble breathing. These symptoms may be an emergency. Get help right away. Call 911. Do not wait to see if the symptoms will go away. Do not drive yourself to the hospital. Summary Hypertension is when the force of blood pumping through your arteries is too strong. If this condition is not controlled, it may put you at risk for serious complications. Your personal target blood pressure may vary depending on your medical conditions, your age, and other factors. For most people, a normal blood pressure is less than 120/80. Hypertension is treated with lifestyle changes, medicines, or a combination of both. Lifestyle changes include losing weight, eating a healthy,  low-sodium diet, exercising more, and limiting alcohol. This information is not intended to replace advice given to you by your health care provider. Make sure you discuss any questions you have with your health care provider. Document Revised: 03/29/2021 Document Reviewed: 03/29/2021 Elsevier Patient Education  2024 [employer].

## 2023-10-04 NOTE — Progress Notes (Signed)
 John Mcclain 68 y.o.   Chief Complaint  Patient presents with   Leg Pain    Patient states he's been having leg cramps in both legs for awhile until he switched to taking magnesium glycinate. He mentions his bp has been high randomly, around 160s, he has getting dizzy spells more here lately. Pt wanting referral for his hernia     HISTORY OF PRESENT ILLNESS: This is a 68 y.o. male A1A complaining of bilateral occasional leg cramping responsive to magnesium. Also concerned about high blood pressure readings at home with occasional episodes of lightheadedness and dizziness. Also has umbilical hernia that needs referral to general surgeon  Leg Pain      Prior to Admission medications   Medication Sig Start Date End Date Taking? Authorizing Provider  acetaminophen  (TYLENOL ) 500 MG tablet Take 500 mg by mouth every 6 (six) hours as needed for moderate pain.   Yes [provider]  alendronate (FOSAMAX) 70 MG tablet Take 70 mg by mouth every Saturday. Take with a full glass of water on an empty stomach.   Yes [provider]  amLODipine  (NORVASC ) 2.5 MG tablet TAKE 1 TABLET EVERY DAY (NEED TO SCHEDULE A 6 MONTH FOLLOW UP APPOINTMENT) 08/15/22  Yes Tiera Mensinger, Isidro Margo, MD  Calcium  Carbonate-Vitamin D  (CALCIUM  600+D PO) Take 1 tablet by mouth daily.   Yes [provider]  clobetasol  (OLUX ) 0.05 % topical foam Apply topically 2 (two) times daily. Patient taking differently: Apply 1 Application topically daily as needed (psoriasis). 05/24/22  Yes Nain Rudd, Isidro Margo, MD  cyclobenzaprine (FLEXERIL) 10 MG tablet Take 10 mg by mouth 3 (three) times daily as needed for muscle spasms.   Yes [provider]  Fremanezumab-vfrm (AJOVY) 225 MG/1.5ML SOAJ Inject 225 mg into the skin every 30 (thirty) days.   Yes [provider]  levETIRAcetam  (KEPPRA ) 750 MG tablet Take 750 mg by mouth 2 (two) times daily.    Yes [provider]  LORazepam  (ATIVAN )  1 MG tablet TAKE 1/2 TO 1 TABLET EVERY DAY AS NEEDED FOR ANXIETY AS SPARINGLY AS POSSIBLE 07/20/23  Yes Fraser Busche, Isidro Margo, MD  Magnesium 400 MG TABS Take 400 mg by mouth every evening.   Yes [provider]  minocycline  (MINOCIN ) 50 MG capsule TAKE 1 CAPSULE TWICE DAILY 01/30/22  Yes Devon Fogo, MD  pantoprazole  (PROTONIX ) 40 MG tablet Take 1 tablet (40 mg total) by mouth daily. Patient needs follow up appointment for future refills. Please call [phone] to schedule an appointment. 08/14/23  Yes Cirigliano, Vito V, DO  polyethylene glycol powder (GLYCOLAX /MIRALAX ) powder Take 17 g by mouth daily. 05/24/17  Yes Bettie Bruce, PA-C  rosuvastatin  (CRESTOR ) 5 MG tablet TAKE 1 TABLET EVERY DAY 08/13/22  Yes Cameka Rae, Isidro Margo, MD  Upadacitinib ER (RINVOQ) 15 MG TB24 Take 15 mg by mouth daily. 07/17/18  Yes [provider]    Allergies  Allergen Reactions   Penicillins Rash    Patient Active Problem List   Diagnosis Date Noted   History of DVT of lower extremity 08/30/2022   Degenerative scoliosis in adult patient 08/04/2022   Lumbar radiculopathy 05/03/2020   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   Spondylosis without myelopathy or radiculopathy, lumbar region 05/03/2020   Hypogonadism male 12/26/2011   Vitamin D  deficiency 12/26/2011   Rheumatoid arthritis (HCC) 12/26/2011   Migraine headache disorder 12/26/2011   Anxiety disorder 12/26/2011   HTN (hypertension), benign 12/26/2011    Past Medical  History:  Diagnosis Date   Allergy 1959   Anxiety    Arthritis    RA   Back pain    Blood transfusion without reported diagnosis 08/14/2022   Chronic migraine    Depression    GERD (gastroesophageal reflux disease)    Heart murmur    History of kidney stones    Hypertension    Pneumonia    Raynaud's disease    Rheumatoid arthritis (HCC)    Ulcer     Past Surgical History:  Procedure Laterality Date   ABDOMINAL  EXPOSURE N/A 08/04/2022   Procedure: ABDOMINAL EXPOSURE;  Surgeon: Young Hensen, MD;  Location: [facility] OR;  Service: Vascular;  Laterality: N/A;   ANTERIOR LAT LUMBAR FUSION N/A 08/04/2022   Procedure: Anterior Lumbar Interbody Fusion Lumbar Five-Sacral One;  Surgeon: Pincus Bridgeman, DO;  Location: [facility] OR;  Service: Neurosurgery;  Laterality: N/A;   ANTERIOR LAT LUMBAR FUSION N/A 08/04/2022   Procedure: ANTERIOR LATERAL INTERBODY FUSION LUMBAR TWO-THREE, LUMBAR THREE-FOUR, LUMBAR FOUR-FIVE;  Surgeon: Dawley, Colby Daub, DO;  Location: [facility] OR;  Service: Neurosurgery;  Laterality: N/A;   COLON SURGERY  2000   polyps removed   COLONOSCOPY WITH ESOPHAGOGASTRODUODENOSCOPY (EGD)  2023   ESOPHAGEAL DILATION  2023   LAMINECTOMY WITH POSTERIOR LATERAL ARTHRODESIS LEVEL 3 N/A 08/08/2022   Procedure: OPEN THORACOLUMBAR INTRUMENTATION,FUSION, THORACIC TEN-PELVIS, LUMBAR THREE-SACRAL ONE DECOMPRESSION  LUMBAR TWO-LUMBAR FIVE POSTERIOR COLUMN OSTEOTOMIES;  Surgeon: Dawley, Colby Daub, DO;  Location: [facility] OR;  Service: Neurosurgery;  Laterality: N/A;   LYMPH NODE DISSECTION     removed lymph node   SPINE SURGERY  06/05/22&06/09/22   VASECTOMY      Social History   Socioeconomic History   Marital status: Married    Spouse name: Not on file   Number of children: Not on file   Years of education: Not on file   Highest education level: Bachelor's degree (e.g., BA, AB, BS)  Occupational History   Not on file  Tobacco Use   Smoking status: Never   Smokeless tobacco: Never  Vaping Use   Vaping status: Never Used  Substance and Sexual Activity   Alcohol use: Yes    Alcohol/week: 1.0 standard drink of alcohol    Types: 1 Standard drinks or equivalent per week    Comment: occasionally   Drug use: No   Sexual activity: Yes    Partners: Female    Comment: Vasectomy  Other Topics Concern   Not on file  Social History Narrative   Married. Education: [employer].    Social Drivers of Corporate investment banker  Strain: Low Risk  (10/03/2023)   Overall Financial Resource Strain (CARDIA)    Difficulty of Paying Living Expenses: Not hard at all  Food Insecurity: No Food Insecurity (10/03/2023)   Hunger Vital Sign    Worried About Running Out of Food in the Last Year: Never true    Ran Out of Food in the Last Year: Never true  Transportation Needs: No Transportation Needs (10/03/2023)   PRAPARE - Administrator, Civil Service (Medical): No    Lack of Transportation (Non-Medical): No  Physical Activity: Unknown (10/03/2023)   Exercise Vital Sign    Days of Exercise per Week: Patient declined    Minutes of Exercise per Session: Not on file  Stress: No Stress Concern Present (10/03/2023)   [employer] of Occupational Health - Occupational Stress Questionnaire    Feeling of Stress : Only a little  Social Connections: Unknown (10/03/2023)   Social Connection and Isolation Panel [NHANES]    Frequency of Communication with Friends and Family: Patient declined    Frequency of Social Gatherings with Friends and Family: Once a week    Attends Religious Services: Patient declined    Database administrator or Organizations: No    Attends Engineer, structural: Not on file    Marital Status: Married  Catering manager Violence: Not At Risk (08/04/2022)   Humiliation, Afraid, Rape, and Kick questionnaire    Fear of Current or Ex-Partner: No    Emotionally Abused: No    Physically Abused: No    Sexually Abused: No    Family History  Problem Relation Age of Onset   Kidney disease Mother    Hypertension Mother    Arthritis Mother        RA   COPD Mother    Hyperlipidemia Mother    Heart attack Father    Diabetes Father    COPD Father    Hypertension Father    Heart disease Father    Migraines Brother    Stroke Maternal Grandfather    Alzheimer's disease Paternal Grandmother    Colon cancer Neg Hx    Pancreatic cancer Neg Hx    Esophageal cancer Neg Hx      Review of  Systems  Constitutional: Negative.  Negative for chills and fever.  HENT: Negative.  Negative for congestion and sore throat.   Respiratory: Negative.  Negative for cough and shortness of breath.   Cardiovascular: Negative.  Negative for chest pain and palpitations.  Gastrointestinal:  Negative for abdominal pain, nausea and vomiting.  Genitourinary: Negative.  Negative for dysuria and hematuria.  Skin: Negative.  Negative for rash.  Neurological:  Negative for dizziness and headaches.  All other systems reviewed and are negative.   Vitals:   10/04/23 1330  BP: (!) 162/90  Pulse: 91  Temp: 98.2 F (36.8 C)  SpO2: 95%    Physical Exam Vitals reviewed.  Constitutional:      Appearance: Normal appearance.  HENT:     Head: Normocephalic.     Mouth/Throat:     Mouth: Mucous membranes are moist.     Pharynx: Oropharynx is clear.  Eyes:     Extraocular Movements: Extraocular movements intact.     Pupils: Pupils are equal, round, and reactive to light.  Cardiovascular:     Rate and Rhythm: Normal rate and regular rhythm.     Pulses: Normal pulses.     Heart sounds: Normal heart sounds.  Pulmonary:     Effort: Pulmonary effort is normal.     Breath sounds: Normal breath sounds.  Abdominal:     Palpations: Abdomen is soft.     Tenderness: There is no abdominal tenderness.     Hernia: A hernia (Umbilical hernia) is present.  Musculoskeletal:     Cervical back: No tenderness.     Right lower leg: No edema.     Left lower leg: No edema.     Comments: Lower extremities: Warm to touch.  Good distal peripheral pulses and capillary refill.  No erythema or swelling.  Good distal sensation.  No signs of DVT or chronic venous insufficiency.  Lymphadenopathy:     Cervical: No cervical adenopathy.  Skin:    General: Skin is warm and dry.     Capillary Refill: Capillary refill takes less than 2 seconds.  Neurological:     General: No focal  deficit present.     Mental Status: He is  alert and oriented to person, place, and time.  Psychiatric:        Mood and Affect: Mood normal.        Behavior: Behavior normal.      ASSESSMENT & PLAN: A total of 44 minutes was spent with the patient and counseling/coordination of care regarding preparing for this visit, review of most recent office visit notes, review of multiple chronic medical conditions and their management, cardiovascular risks associated with uncontrolled hypertension, review of all medications and changes made, review of most recent bloodwork results, review of health maintenance items, education on nutrition, prognosis, documentation, and need for follow up.   Problem List Items Addressed This Visit       Cardiovascular and Mediastinum   HTN (hypertension), benign - Primary   Uncontrolled hypertension with elevated readings at home and in the office Recommend to increase amlodipine  to 5 mg daily Advised to monitor blood pressure readings at home twice a day for the next couple of weeks and keep a log If numbers persistently abnormally high recommend to start lisinopril  20 mg along with the amlodipine . Cardiovascular risks associated with uncontrolled hypertension discussed Diet and nutrition discussed Benefits of exercise discussed      Relevant Medications   amLODipine  (NORVASC ) 5 MG tablet   lisinopril  (ZESTRIL ) 20 MG tablet     Musculoskeletal and Integument   Rheumatoid arthritis (HCC)   Stable and currently on treatment guided by rheumatologist        Other   Anxiety disorder   Stable on current medications. Takes lorazepam  as needed      Relevant Medications   LORazepam  (ATIVAN ) 1 MG tablet   Umbilical hernia without obstruction and without gangrene   Without complications.  Longstanding. Occasional pain and tenderness Requesting referral to general surgeon for possible repair Referral placed today.      Relevant Orders   Ambulatory referral to General Surgery   Patient  Instructions  Hypertension, Adult High blood pressure (hypertension) is when the force of blood pumping through the arteries is too strong. The arteries are the blood vessels that carry blood from the heart throughout the body. Hypertension forces the heart to work harder to pump blood and may cause arteries to become narrow or stiff. Untreated or uncontrolled hypertension can lead to a heart attack, heart failure, a stroke, kidney disease, and other problems. A blood pressure reading consists of a higher number over a lower number. Ideally, your blood pressure should be below 120/80. The first ("top") number is called the systolic pressure. It is a measure of the pressure in your arteries as your heart beats. The second ("bottom") number is called the diastolic pressure. It is a measure of the pressure in your arteries as the heart relaxes. What are the causes? The exact cause of this condition is not known. There are some conditions that result in high blood pressure. What increases the risk? Certain factors may make you more likely to develop high blood pressure. Some of these risk factors are under your control, including: Smoking. Not getting enough exercise or physical activity. Being overweight. Having too much fat, sugar, calories, or salt (sodium) in your diet. Drinking too much alcohol. Other risk factors include: Having a personal history of heart disease, diabetes, high cholesterol, or kidney disease. Stress. Having a family history of high blood pressure and high cholesterol. Having obstructive sleep apnea. Age. The risk increases with age. What are  the signs or symptoms? High blood pressure may not cause symptoms. Very high blood pressure (hypertensive crisis) may cause: Headache. Fast or irregular heartbeats (palpitations). Shortness of breath. Nosebleed. Nausea and vomiting. Vision changes. Severe chest pain, dizziness, and seizures. How is this diagnosed? This condition  is diagnosed by measuring your blood pressure while you are seated, with your arm resting on a flat surface, your legs uncrossed, and your feet flat on the floor. The cuff of the blood pressure monitor will be placed directly against the skin of your upper arm at the level of your heart. Blood pressure should be measured at least twice using the same arm. Certain conditions can cause a difference in blood pressure between your right and left arms. If you have a high blood pressure reading during one visit or you have normal blood pressure with other risk factors, you may be asked to: Return on a different day to have your blood pressure checked again. Monitor your blood pressure at home for 1 week or longer. If you are diagnosed with hypertension, you may have other blood or imaging tests to help your health care provider understand your overall risk for other conditions. How is this treated? This condition is treated by making healthy lifestyle changes, such as eating healthy foods, exercising more, and reducing your alcohol intake. You may be referred for counseling on a healthy diet and physical activity. Your health care provider may prescribe medicine if lifestyle changes are not enough to get your blood pressure under control and if: Your systolic blood pressure is above 130. Your diastolic blood pressure is above 80. Your personal target blood pressure may vary depending on your medical conditions, your age, and other factors. Follow these instructions at home: Eating and drinking  Eat a diet that is high in fiber and potassium, and low in sodium, added sugar, and fat. An example of this eating plan is called the DASH diet. DASH stands for Dietary Approaches to Stop Hypertension. To eat this way: Eat plenty of fresh fruits and vegetables. Try to fill one half of your plate at each meal with fruits and vegetables. Eat whole grains, such as whole-wheat pasta, brown rice, or whole-grain bread.  Fill about one fourth of your plate with whole grains. Eat or drink low-fat dairy products, such as skim milk or low-fat yogurt. Avoid fatty cuts of meat, processed or cured meats, and poultry with skin. Fill about one fourth of your plate with lean proteins, such as fish, chicken without skin, beans, eggs, or tofu. Avoid pre-made and processed foods. These tend to be higher in sodium, added sugar, and fat. Reduce your daily sodium intake. Many people with hypertension should eat less than 1,500 mg of sodium a day. Do not drink alcohol if: Your health care provider tells you not to drink. You are pregnant, may be pregnant, or are planning to become pregnant. If you drink alcohol: Limit how much you have to: 0-1 drink a day for women. 0-2 drinks a day for men. Know how much alcohol is in your drink. In the [place] one drink equals one 12 oz bottle of beer (355 mL), one 5 oz glass of wine (148 mL), or one 1 oz glass of hard liquor (44 mL). Lifestyle  Work with your health care provider to maintain a healthy body weight or to lose weight. Ask what an ideal weight is for you. Get at least 30 minutes of exercise that causes your heart to beat faster (aerobic exercise)  most days of the week. Activities may include walking, swimming, or biking. Include exercise to strengthen your muscles (resistance exercise), such as Pilates or lifting weights, as part of your weekly exercise routine. Try to do these types of exercises for 30 minutes at least 3 days a week. Do not use any products that contain nicotine or tobacco. These products include cigarettes, chewing tobacco, and vaping devices, such as e-cigarettes. If you need help quitting, ask your health care provider. Monitor your blood pressure at home as told by your health care provider. Keep all follow-up visits. This is important. Medicines Take over-the-counter and prescription medicines only as told by your health care provider. Follow directions  carefully. Blood pressure medicines must be taken as prescribed. Do not skip doses of blood pressure medicine. Doing this puts you at risk for problems and can make the medicine less effective. Ask your health care provider about side effects or reactions to medicines that you should watch for. Contact a health care provider if you: Think you are having a reaction to a medicine you are taking. Have headaches that keep coming back (recurring). Feel dizzy. Have swelling in your ankles. Have trouble with your vision. Get help right away if you: Develop a severe headache or confusion. Have unusual weakness or numbness. Feel faint. Have severe pain in your chest or abdomen. Vomit repeatedly. Have trouble breathing. These symptoms may be an emergency. Get help right away. Call 911. Do not wait to see if the symptoms will go away. Do not drive yourself to the hospital. Summary Hypertension is when the force of blood pumping through your arteries is too strong. If this condition is not controlled, it may put you at risk for serious complications. Your personal target blood pressure may vary depending on your medical conditions, your age, and other factors. For most people, a normal blood pressure is less than 120/80. Hypertension is treated with lifestyle changes, medicines, or a combination of both. Lifestyle changes include losing weight, eating a healthy, low-sodium diet, exercising more, and limiting alcohol. This information is not intended to replace advice given to you by your health care provider. Make sure you discuss any questions you have with your health care provider. Document Revised: 03/29/2021 Document Reviewed: 03/29/2021 Elsevier Patient Education  2024 Elsevier Inc.    Maryagnes Small, MD [facility]

## 2023-10-04 NOTE — Telephone Encounter (Signed)
 PT was told that he could have Anusol  HC cream if the other prescription was too high. He needs to have it sent to the [facility] on [address]. He is there waiting.

## 2023-10-04 NOTE — Assessment & Plan Note (Signed)
 Uncontrolled hypertension with elevated readings at home and in the office Recommend to increase amlodipine  to 5 mg daily Advised to monitor blood pressure readings at home twice a day for the next couple of weeks and keep a log If numbers persistently abnormally high recommend to start lisinopril  20 mg along with the amlodipine . Cardiovascular risks associated with uncontrolled hypertension discussed Diet and nutrition discussed Benefits of exercise discussed

## 2023-10-04 NOTE — Patient Instructions (Addendum)
 We have sent the following medications to your pharmacy for you to pick up at your convenience: Hydrcortisone 25mg  suppository, insert  1 twice daily.  Please do sitz baths- these can be found at the pharmacy. It is a Chief Operating Officer that is put in your toliet.  Please increase fiber or add benefiber, increase water and increase acitivity.  Will send in hydrocoritsone suppository, cheapest with GOODRX from [employer], [employer], [employer] or [employer] if your insurance does not pay for it. If the hemorrhoid suppository sent in is too expensive you can do this over the counter trick.  Apply a pea size amount of generic prescription Anusol  HC cream that has been sent into your pharmacy to the tip of an over the counter PrepH suppository and insert rectally once every night for at least 7 nights.  If this does not improve there are procedures that can be done.   Thank you for trusting me with your gastrointestinal care!   Santina Cull, PA -C  _______________________________________________________  If your blood pressure at your visit was 140/90 or greater, please contact your primary care physician to follow up on this.  _______________________________________________________  If you are age 43 or older, your body mass index should be between 23-30. Your Body mass index is 27.12 kg/m. If this is out of the aforementioned range listed, please consider follow up with your Primary Care Provider.  If you are age 65 or younger, your body mass index should be between 19-25. Your Body mass index is 27.12 kg/m. If this is out of the aformentioned range listed, please consider follow up with your Primary Care Provider.   ________________________________________________________  The [facility] providers would like to encourage you to use MYCHART to communicate with providers for non-urgent requests or questions.  Due to long hold times on the telephone, sending your provider a message by [facility] may be  a faster and more efficient way to get a response.  Please allow 48 business hours for a response.  Please remember that this is for non-urgent requests.  _______________________________________________________   About Hemorrhoids  Hemorrhoids are swollen veins in the lower rectum and anus.  Also called piles, hemorrhoids are a common problem.  Hemorrhoids may be internal (inside the rectum) or external (around the anus).  Internal Hemorrhoids  Internal hemorrhoids are often painless, but they rarely cause bleeding.  The internal veins may stretch and fall down (prolapse) through the anus to the outside of the body.  The veins may then become irritated and painful.  External Hemorrhoids  External hemorrhoids can be easily seen or felt around the anal opening.  They are under the skin around the anus.  When the swollen veins are scratched or broken by straining, rubbing or wiping they sometimes bleed.  How Hemorrhoids Occur  Veins in the rectum and around the anus tend to swell under pressure.  Hemorrhoids can result from increased pressure in the veins of your anus or rectum.  Some sources of pressure are:  Straining to have a bowel movement because of constipation Waiting too long to have a bowel movement Coughing and sneezing often Sitting for extended periods of time, including on the toilet Diarrhea Obesity Trauma or injury to the anus Some liver diseases Stress Family history of hemorrhoids Pregnancy  Pregnant women should try to avoid becoming constipated, because they are more likely to have hemorrhoids during pregnancy.  In the last trimester of pregnancy, the enlarged uterus may press on blood vessels and causes hemorrhoids.  In addition, the strain of childbirth sometimes causes hemorrhoids after the birth.  Symptoms of Hemorrhoids  Some symptoms of hemorrhoids include: Swelling and/or a tender lump around the anus Itching, mild burning and bleeding around the  anus Painful bowel movements with or without constipation Bright red blood covering the stool, on toilet paper or in the toilet bowel.   Symptoms usually go away within a few days.  Always talk to your doctor about any bleeding to make sure it is not from some other causes.  Diagnosing and Treating Hemorrhoids  Diagnosis is made by an examination by your healthcare provider.  Special test can be performed by your doctor.    Most cases of hemorrhoids can be treated with: High-fiber diet: Eat more high-fiber foods, which help prevent constipation.  Ask for more detailed fiber information on types and sources of fiber from your healthcare provider. Fluids: Drink plenty of water.  This helps soften bowel movements so they are easier to pass. Sitz baths and cold packs: Sitting in lukewarm water two or three times a day for 15 minutes cleases the anal area and may relieve discomfort.  If the water is too hot, swelling around the anus will get worse.  Placing a cloth-covered ice pack on the anus for ten minutes four times a day can also help reduce selling.  Gently pushing a prolapsed hemorrhoid back inside after the bath or ice pack can be helpful. Medications: For mild discomfort, your healthcare provider may suggest over-the-counter pain medication or prescribe a cream or ointment for topical use.  The cream may contain witch hazel, zinc oxide or petroleum jelly.  Medicated suppositories are also a treatment option.  Always consult your doctor before applying medications or creams. Procedures and surgeries: There are also a number of procedures and surgeries to shrink or remove hemorrhoids in more serious cases.  Talk to your physician about these options.  You can often prevent hemorrhoids or keep them from becoming worse by maintaining a healthy lifestyle.  Eat a fiber-rich diet of fruits, vegetables and whole grains.  Also, drink plenty of water and exercise regularly.   2007, [employer] Doc.30  Umbilical Hernia, Adult  A hernia is a lump of tissue that pushes through an opening in the muscles. An umbilical hernia happens in the belly, near the belly button. The hernia may contain tissues from the small or large intestine. It may also have fatty tissue that covers the intestines. Umbilical hernias in adults may get worse over time. They need to be treated with surgery. There are several types of umbilical hernias. They include: Indirect hernia. This occurs just above or below the belly button. It's the most common type of umbilical hernia in adults. Direct hernia. This type occurs in an opening that's formed by the belly button. Reducible hernia. This hernia comes and goes. You may see it only when you strain, cough, or lift something heavy. This type of hernia can be pushed back into the belly (reduced). Incarcerated hernia. This traps the hernia in the wall of the belly. This type of hernia can't be pushed back into the belly. It can cause a strangulated hernia. Strangulated hernia. This hernia cuts off blood flow to the tissues inside the hernia. The tissues can die if this happens. This type of hernia must be treated right away. What are the causes? An umbilical hernia happens when tissue inside the belly pushes through an opening in the muscles of the belly. What increases the  risk? You're more likely to get this hernia if: You strain while lifting or pushing heavy objects. You've had several pregnancies. You have a condition that puts pressure on your belly, and you've had it for a long time. These include: Obesity. A buildup of fluid inside your belly. Vomiting or coughing all the time. Trouble pooping (constipation). You've had surgery that weakened the muscles in the belly. What are the signs or symptoms? The main symptom of this condition is a bulge at the belly button or near it. The bulge does not cause pain. Other symptoms depend on the type of  hernia you have. A reducible hernia may be seen only when you strain, cough, or lift something heavy. Other symptoms may include: Dull pain. A feeling of pressure. An incarcerated hernia may cause very bad pain. Also, you may: Vomit or feel like you may vomit. Not be able to pass gas. A strangulated hernia may cause: Pain that gets worse and worse. Vomiting, or feeling like you may vomit. Pain when you press on the hernia. Change of color on the skin over the hernia. The skin may become red or purple. Trouble pooping. Blood in the poop. How is this diagnosed? This condition may be diagnosed based on: Your symptoms and medical history. A physical exam. You may be asked to cough or strain while standing. These actions will put pressure inside your belly. The pressure can force the hernia through the opening in your muscles. Your health care provider may try to push the hernia back into your belly (reduce). How is this treated? Surgery is the only treatment for an umbilical hernia. Surgery for a strangulated hernia must be done right away. If you have a small hernia that's not incarcerated, you may need to lose weight before the surgery is done. Follow these instructions at home: Managing constipation You may need to take these actions to prevent trouble pooping. This will help to prevent straining. Drink enough fluid to keep your pee (urine) pale yellow. Take over-the-counter or prescription medicines. Eat foods that are high in fiber, such as beans, whole grains, and fresh fruits and vegetables. Limit foods that are high in fat and sugars, such as fried or sweet foods. General instructions Do not try to push the hernia back in. Lose weight, if told by your provider. Watch your hernia for any changes in color or size. Tell your provider if any changes occur. You may need to avoid activities that put pressure on your hernia. You may have to avoid lifting. Ask your provider how much  you can safely lift. Take over-the-counter and prescription medicines only as told by your provider. Contact a health care provider if: Your hernia gets larger or feels hard. Your hernia becomes painful. You get a fever or chills. Get help right away if: You get very bad pain near the area of the hernia, and the pain comes on suddenly. You have pain and you vomit or feel like you may vomit. The skin over your hernia changes color. These symptoms may be an emergency. Get help right away. Call 911. Do not wait to see if the symptoms go away. Do not drive yourself to the hospital. This information is not intended to replace advice given to you by your health care provider. Make sure you discuss any questions you have with your health care provider. Document Revised: 09/12/2022 Document Reviewed: 09/12/2022 Elsevier Patient Education  2024 [employer].

## 2023-10-04 NOTE — Assessment & Plan Note (Signed)
 Without complications.  Longstanding. Occasional pain and tenderness Requesting referral to general surgeon for possible repair Referral placed today.

## 2023-10-05 ENCOUNTER — Telehealth: Payer: Self-pay

## 2023-10-05 MED ORDER — HYDROCORTISONE (PERIANAL) 2.5 % EX CREA: 1.0000 | TOPICAL_CREAM | Freq: Two times a day (BID) | CUTANEOUS | 2 refills | Status: DC

## 2023-10-05 NOTE — Telephone Encounter (Signed)
 Mr. John Mcclain called back to cancel his appointment for the Flex sigmoidoscopy because his wife will be out of town that day and he will not have a care provider available.

## 2023-10-05 NOTE — Progress Notes (Signed)
 Agree with the assessment and plan as outlined by Santina Cull, PA-C.  May be worth setting up for flexible sigmoidoscopy for reevaluation as well if we have space in the interim prior to his follow-up with me.  Otherwise, we will plan on repeat endoscopy when he comes for office follow-up.  Camika Marsico, DO, [facility]

## 2023-10-05 NOTE — Telephone Encounter (Signed)
 Sent in external cream for the patient.

## 2023-10-05 NOTE — Addendum Note (Signed)
 Addended by: Orlene Bjork on: 10/05/2023 11:40 AM   Modules accepted: Orders

## 2023-10-17 ENCOUNTER — Other Ambulatory Visit: Admitting: Gastroenterology

## 2023-10-22 ENCOUNTER — Ambulatory Visit: Admitting: Gastroenterology

## 2023-10-22 ENCOUNTER — Encounter: Payer: Self-pay | Admitting: Gastroenterology

## 2023-10-22 VITALS — BP 160/90 | HR 84 | Ht 64.25 in | Wt 158.5 lb

## 2023-10-22 DIAGNOSIS — K641 Second degree hemorrhoids: Secondary | ICD-10-CM | POA: Diagnosis not present

## 2023-10-22 DIAGNOSIS — K649 Unspecified hemorrhoids: Secondary | ICD-10-CM

## 2023-10-22 NOTE — Progress Notes (Signed)
 g  Chief Complaint:    Symptomatic Internal Hemorrhoids; Hemorrhoid Band Ligation  GI History: 68 year old male with medical history as outlined below, follows in the GI clinic for the following:  1) GERD.  Longstanding history of GERD.  Previously well controlled with Zantac, then changed to famotidine 20 mg/day. Now well controlled with Protonix  40 mg daily with rare breakthrough.    Developed dysphagia in 2021.  EGD in 09/2020 with esophageal dilation with [address] with resolution of dysphagia as below.   2) Colon polyps.  Has a history of colon polyps including a "grapelike cluster" that had to be removed surgically in the 1990s.  Since then, has been on 5-year surveillance interval.     3) Symptomatic hemorrhoids.  History of symptomatic hemorrhoids with intermittent BRBPR and rectal itching.  Suboptimal response to topical medications and conservative management. -05/19/2021: Banding of the LL hemorrhoid - 06/21/2021: Banding of the RP hemorrhoid - 07/26/2021: Banding of the RA hemorrhoid        Endoscopic History: - Colonoscopy (09/2009): Large hemorrhoids, otherwise normal.  Repeat in 5 years due to prior history of polyps - EGD (09/2020, Dr. Karene Oto): Esophageal longitudinal furrows (biopsies negative for EOE) dilated with 54 [address] Maloney with mucosal rent at 16 cm and at the GEJ.  10 mm gastric ulcer, moderate gastritis (path benign).  Treated with Protonix  40 mg bid and Carafate  - Colonoscopy (09/2020, Dr. Karene Oto): 5 mm sigmoid polyp (TA), 2 rectal hyperplastic polyps, post polypectomy scar without residual polyp in rectum, sigmoid diverticulosis, internal hemorrhoids.  Repeat 5 years - EGD (10/2020, Dr. Karene Oto): Normal esophagus, mild non-H. pylori gastritis    HPI:     Patient is a 68 y.o. malewith a history of symptomatic internal hemorrhoids presenting to the Gastroenterology Clinic for follow-up and ongoing treatment.  He had done well with hemorrhoid  banding in 2020 07/2021 as above with resolution of hemorrhoidal symptoms until recently.  Was seen in the GI clinic on 10/04/2023 and  c/o episodic rectal discomfort with scant BRB on tissue paper.  Exam notable for right posterior hemorrhoid and was referred for hemorrhoid banding.  In the meantime, has been doing sitz bath's, increased fiber and water intake, and used hydrocortisone  suppository.  Reflux otherwise well-controlled on pantoprazole  40 mg daily.  No change in medical or surgical history, medications, allergies, social history since last appointment with me.   Review of systems:     No chest pain, no SOB, no fevers, no urinary sx   Past Medical History:  Diagnosis Date   Allergy 1961   Anxiety    Arthritis    RA   Back pain    Blood transfusion without reported diagnosis 08/14/2022   Chronic migraine    Depression    GERD (gastroesophageal reflux disease)    Heart murmur    History of kidney stones    Hypertension    Pneumonia    Raynaud's disease    Rheumatoid arthritis (HCC)    Ulcer     Patient's surgical history, family medical history, social history, medications and allergies were all reviewed in Epic    Current Outpatient Medications  Medication Sig Dispense Refill   acetaminophen  (TYLENOL ) 500 MG tablet Take 500 mg by mouth every 6 (six) hours as needed for moderate pain.     alendronate (FOSAMAX) 70 MG tablet Take 70 mg by mouth every Saturday. Take with a full glass of water on an empty stomach.     amLODipine  (NORVASC )  5 MG tablet Take 1 tablet (5 mg total) by mouth daily. 90 tablet 3   Calcium  Carbonate-Vitamin D  (CALCIUM  600+D PO) Take 1 tablet by mouth daily.     clobetasol  (OLUX ) 0.05 % topical foam Apply topically 2 (two) times daily. (Patient taking differently: Apply 1 Application topically daily as needed (psoriasis).) 50 g 1   cyclobenzaprine (FLEXERIL) 10 MG tablet Take 10 mg by mouth 3 (three) times daily as needed for muscle spasms.      Fremanezumab-vfrm (AJOVY) 225 MG/1.5ML SOAJ Inject 225 mg into the skin every 30 (thirty) days.     hydrocortisone  (ANUSOL -HC) 2.5 % rectal cream Place 1 Application rectally 2 (two) times daily. 30 g 2   levETIRAcetam  (KEPPRA ) 750 MG tablet Take 750 mg by mouth 2 (two) times daily.      lisinopril  (ZESTRIL ) 20 MG tablet Take 1 tablet (20 mg total) by mouth daily. 90 tablet 3   LORazepam  (ATIVAN ) 1 MG tablet TAKE 1/2 TO 1 TABLET EVERY DAY AS NEEDED FOR ANXIETY AS SPARINGLY AS POSSIBLE 90 tablet 1   Magnesium 400 MG TABS Take 400 mg by mouth every evening.     pantoprazole  (PROTONIX ) 40 MG tablet Take 1 tablet (40 mg total) by mouth daily. Patient needs follow up appointment for future refills. Please call [phone] to schedule an appointment. 90 tablet 0   polyethylene glycol powder (GLYCOLAX /MIRALAX ) powder Take 17 g by mouth daily. 3350 g prn   rosuvastatin  (CRESTOR ) 5 MG tablet TAKE 1 TABLET EVERY DAY 90 tablet 3   Upadacitinib ER (RINVOQ) 15 MG TB24 Take 15 mg by mouth daily.     sildenafil  (VIAGRA ) 50 MG tablet Take 50 mg by mouth as needed.     Vitamins-Lipotropics (B COMPLEX FORMULA 1, LIPOTROP,) TABS Take 1 tablet by mouth daily.     No current facility-administered medications for this visit.    Physical Exam:     BP (!) 160/90 (BP Location: Right Arm, Patient Position: Sitting, Cuff Size: Normal)   Pulse 84   Ht 5' 4.25" (1.632 m)   Wt 158 lb 8 oz (71.9 kg)   BMI 27.00 kg/m   GENERAL:  Pleasant male in NAD PSYCH: : Cooperative, normal affect Rectal exam: Sensation intact and preserved anal wink.  Grade 2 hemorrhoids noted in RP and LL positions on anoscopy.  No external anal fissures noted. Normal sphincter tone. No palpable mass. No blood on the exam glove. (Chaperone: Tanner Fanny, CMA).   IMPRESSION and PLAN:    #1.  Symptomatic internal hemorrhoids: PROCEDURE NOTE: The patient presents with symptomatic grade 2 hemorrhoids, unresponsive to maximal medical therapy,  requesting rubber band ligation of symptomatic hemorrhoidal disease.  All risks, benefits and alternative forms of therapy were described and informed consent was obtained.  In the Left Lateral Decubitus position, anoscopic examination revealed grade 2 hemorrhoids in the RP and LL position(s).  The anorectum was pre-medicated with RectiCare. The decision was made to band the RP and LL internal hemorrhoid, and the [facility] System was used to perform band ligation without complication.  Digital anorectal examination was then performed to assure proper positioning of the band, and to adjust the banded tissue as required.  The patient was discharged home without pain or other issues.  Dietary and behavioral recommendations were given and along with follow-up instructions.     The following adjunctive treatments were recommended:  -Resume high-fiber diet with fiber supplement (i.e. Citrucel or Benefiber) with goal for soft stools without  straining to have a BM. -Resume adequate fluid intake.  The patient will return in 4+ weeks for follow-up and possible additional banding as required. No complications were encountered and the patient tolerated the procedure well.  If symptoms recur or persist, plan for flexible sigmoidoscopy for further evaluation.   #2.  GERD - Well-controlled on current therapy - Continue pantoprazole  40 mg daily - Continue antireflux lifestyle/dietary modifications  #3.  History of colon polyps - Repeat colonoscopy in 09/2025 for ongoing polyp surveillance  #4.  Rheumatoid arthritis - On Rinvoq 15 mg daily      Julann Mcgilvray V Ty Buntrock ,DO, FACG 10/22/2023, 10:41 AM

## 2023-10-22 NOTE — Patient Instructions (Addendum)
 _______________________________________________________  If your blood pressure at your visit was 140/90 or greater, please contact your primary care physician to follow up on this. _______________________________________________________  If you are age 70 or older, your body mass index should be between 23-30. Your Body mass index is 27 kg/m. If this is out of the aforementioned range listed, please consider follow up with your Primary Care Provider. ________________________________________________________  The [facility] providers would like to encourage you to use MYCHART to communicate with providers for non-urgent requests or questions.  Due to long hold times on the telephone, sending your provider a message by [facility] may be a faster and more efficient way to get a response.  Please allow 48 business hours for a response.  Please remember that this is for non-urgent requests.  _______________________________________________________  Harles Lied PROCEDURE    FOLLOW-UP CARE   The procedure you have had should have been relatively painless since the banding of the area involved does not have nerve endings and there is no pain sensation.  The rubber band cuts off the blood supply to the hemorrhoid and the band may fall off as soon as 48 hours after the banding (the band may occasionally be seen in the toilet bowl following a bowel movement). You may notice a temporary feeling of fullness in the rectum which should respond adequately to plain Tylenol  or Motrin.  Following the banding, avoid strenuous exercise that evening and resume full activity the next day.  A sitz bath (soaking in a warm tub) or bidet is soothing, and can be useful for cleansing the area after bowel movements.     To avoid constipation, take two tablespoons of natural wheat bran, natural oat bran, flax, Benefiber or any over the counter fiber supplement and increase your water intake to 7-8 glasses daily.     Unless you have been prescribed anorectal medication, do not put anything inside your rectum for two weeks: No suppositories, enemas, fingers, etc.  Occasionally, you may have more bleeding than usual after the banding procedure.  This is often from the untreated hemorrhoids rather than the treated one.  Don't be concerned if there is a tablespoon or so of blood.  If there is more blood than this, lie flat with your bottom higher than your head and apply an ice pack to the area. If the bleeding does not stop within a half an hour or if you feel faint, call our office at [phone] or go to the emergency room.  Problems are not common; however, if there is a substantial amount of bleeding, severe pain, chills, fever or difficulty passing urine (very rare) or other problems, you should call us  at [phone] or report to the nearest emergency room.  Do not stay seated continuously for more than 2-3 hours for a day or two after the procedure.  Tighten your buttock muscles 10-15 times every two hours and take 10-15 deep breaths every 1-2 hours.  Do not spend more than a few minutes on the toilet if you cannot empty your bowel; instead re-visit the toilet at a later time.   You will follow up in our office on an as needed basis.   It was a pleasure to see you today!  Vito Cirigliano, D.O.

## 2023-10-31 ENCOUNTER — Other Ambulatory Visit: Payer: Self-pay | Admitting: Orthopedic Surgery

## 2023-10-31 DIAGNOSIS — R936 Abnormal findings on diagnostic imaging of limbs: Secondary | ICD-10-CM | POA: Diagnosis not present

## 2023-10-31 DIAGNOSIS — G5601 Carpal tunnel syndrome, right upper limb: Secondary | ICD-10-CM | POA: Diagnosis not present

## 2023-10-31 DIAGNOSIS — R200 Anesthesia of skin: Secondary | ICD-10-CM | POA: Diagnosis not present

## 2023-10-31 DIAGNOSIS — R202 Paresthesia of skin: Secondary | ICD-10-CM | POA: Diagnosis not present

## 2023-11-05 ENCOUNTER — Encounter (HOSPITAL_BASED_OUTPATIENT_CLINIC_OR_DEPARTMENT_OTHER): Payer: Self-pay | Admitting: Orthopedic Surgery

## 2023-11-05 ENCOUNTER — Other Ambulatory Visit: Payer: Self-pay

## 2023-11-07 ENCOUNTER — Encounter (HOSPITAL_BASED_OUTPATIENT_CLINIC_OR_DEPARTMENT_OTHER)
Admission: RE | Admit: 2023-11-07 | Discharge: 2023-11-07 | Disposition: A | Discharge status: Home / Self Care | Source: Ambulatory Visit | Attending: Orthopedic Surgery | Admitting: Orthopedic Surgery

## 2023-11-07 DIAGNOSIS — Z01818 Encounter for other preprocedural examination: Secondary | ICD-10-CM | POA: Insufficient documentation

## 2023-11-07 NOTE — Progress Notes (Signed)

## 2023-11-09 ENCOUNTER — Ambulatory Visit (HOSPITAL_COMMUNITY)

## 2023-11-09 ENCOUNTER — Encounter (HOSPITAL_COMMUNITY): Payer: Self-pay | Admitting: Orthopedic Surgery

## 2023-11-09 ENCOUNTER — Other Ambulatory Visit: Payer: Self-pay

## 2023-11-09 ENCOUNTER — Ambulatory Visit (HOSPITAL_COMMUNITY)
Admission: RE | Admit: 2023-11-09 | Discharge: 2023-11-09 | Disposition: A | Discharge status: Home / Self Care | Attending: Orthopedic Surgery | Admitting: Orthopedic Surgery

## 2023-11-09 ENCOUNTER — Encounter (HOSPITAL_COMMUNITY)
Admission: RE | Disposition: A | Discharge status: Home / Self Care | Payer: Self-pay | Source: Home / Self Care | Attending: Orthopedic Surgery

## 2023-11-09 DIAGNOSIS — K219 Gastro-esophageal reflux disease without esophagitis: Secondary | ICD-10-CM | POA: Insufficient documentation

## 2023-11-09 DIAGNOSIS — G5601 Carpal tunnel syndrome, right upper limb: Secondary | ICD-10-CM

## 2023-11-09 DIAGNOSIS — M069 Rheumatoid arthritis, unspecified: Secondary | ICD-10-CM | POA: Diagnosis not present

## 2023-11-09 DIAGNOSIS — I10 Essential (primary) hypertension: Secondary | ICD-10-CM | POA: Diagnosis not present

## 2023-11-09 DIAGNOSIS — Z01818 Encounter for other preprocedural examination: Secondary | ICD-10-CM

## 2023-11-09 HISTORY — PX: CARPAL TUNNEL RELEASE: SHX101

## 2023-11-09 SURGERY — CARPAL TUNNEL RELEASE
Anesthesia: General | Site: Wrist | Laterality: Right

## 2023-11-09 MED ORDER — DEXAMETHASONE SODIUM PHOSPHATE 10 MG/ML IJ SOLN
INTRAMUSCULAR | Status: DC | PRN
  Administered 2023-11-09: 10 mg via INTRAVENOUS

## 2023-11-09 MED ORDER — HYDROCODONE-ACETAMINOPHEN 5-325 MG PO TABS: 1.0000 | ORAL_TABLET | Freq: Four times a day (QID) | ORAL | 0 refills | Status: DC | PRN

## 2023-11-09 MED ORDER — BUPIVACAINE HCL (PF) 0.25 % IJ SOLN
INTRAMUSCULAR | Status: AC
  Filled 2023-11-09: qty 10

## 2023-11-09 MED ORDER — FENTANYL CITRATE (PF) 250 MCG/5ML IJ SOLN
INTRAMUSCULAR | Status: DC | PRN
Start: 2023-11-09 — End: 2023-11-09
  Administered 2023-11-09 (×2): 50 ug via INTRAVENOUS

## 2023-11-09 MED ORDER — FENTANYL CITRATE (PF) 250 MCG/5ML IJ SOLN
INTRAMUSCULAR | Status: AC
  Filled 2023-11-09: qty 5

## 2023-11-09 MED ORDER — PROPOFOL 10 MG/ML IV BOLUS
INTRAVENOUS | Status: AC
  Filled 2023-11-09: qty 20

## 2023-11-09 MED ORDER — ONDANSETRON HCL 4 MG/2ML IJ SOLN
INTRAMUSCULAR | Status: AC
  Filled 2023-11-09: qty 2

## 2023-11-09 MED ORDER — CEFAZOLIN SODIUM-DEXTROSE 2-4 GM/100ML-% IV SOLN
2.0000 g | INTRAVENOUS | Status: AC
  Administered 2023-11-09: 2 g via INTRAVENOUS

## 2023-11-09 MED ORDER — FENTANYL CITRATE (PF) 100 MCG/2ML IJ SOLN: 25.0000 ug | INTRAMUSCULAR | Status: DC | PRN

## 2023-11-09 MED ORDER — ACETAMINOPHEN 10 MG/ML IV SOLN: 1000.0000 mg | Freq: Once | INTRAVENOUS | Status: DC | PRN

## 2023-11-09 MED ORDER — OXYCODONE HCL 5 MG/5ML PO SOLN: 5.0000 mg | Freq: Once | ORAL | Status: AC | PRN

## 2023-11-09 MED ORDER — MIDAZOLAM HCL 2 MG/2ML IJ SOLN
INTRAMUSCULAR | Status: DC | PRN
  Administered 2023-11-09 (×2): 1 mg via INTRAVENOUS

## 2023-11-09 MED ORDER — ACETAMINOPHEN 10 MG/ML IV SOLN
INTRAVENOUS | Status: DC | PRN
  Administered 2023-11-09: 1000 mg via INTRAVENOUS

## 2023-11-09 MED ORDER — ONDANSETRON HCL 4 MG/2ML IJ SOLN
INTRAMUSCULAR | Status: DC | PRN
  Administered 2023-11-09: 4 mg via INTRAVENOUS

## 2023-11-09 MED ORDER — DEXAMETHASONE SODIUM PHOSPHATE 10 MG/ML IJ SOLN
INTRAMUSCULAR | Status: AC
  Filled 2023-11-09: qty 1

## 2023-11-09 MED ORDER — LIDOCAINE 2% (20 MG/ML) 5 ML SYRINGE
INTRAMUSCULAR | Status: DC | PRN
  Administered 2023-11-09: 100 mg via INTRAVENOUS

## 2023-11-09 MED ORDER — OXYCODONE HCL 5 MG PO TABS
5.0000 mg | ORAL_TABLET | Freq: Once | ORAL | Status: AC | PRN
  Administered 2023-11-09: 5 mg via ORAL

## 2023-11-09 MED ORDER — PROPOFOL 10 MG/ML IV BOLUS
INTRAVENOUS | Status: DC | PRN
  Administered 2023-11-09: 140 mg via INTRAVENOUS
  Administered 2023-11-09: 20 mg via INTRAVENOUS

## 2023-11-09 MED ORDER — BUPIVACAINE HCL (PF) 0.25 % IJ SOLN
INTRAMUSCULAR | Status: DC | PRN
  Administered 2023-11-09: 9 mL

## 2023-11-09 MED ORDER — LACTATED RINGERS IV SOLN: INTRAVENOUS | Status: DC

## 2023-11-09 MED ORDER — DROPERIDOL 2.5 MG/ML IJ SOLN: 0.6250 mg | Freq: Once | INTRAMUSCULAR | Status: DC | PRN

## 2023-11-09 MED ORDER — OXYCODONE HCL 5 MG PO TABS
ORAL_TABLET | ORAL | Status: AC
Start: 2023-11-09 — End: ?
  Filled 2023-11-09: qty 1

## 2023-11-09 MED ORDER — CEFAZOLIN SODIUM-DEXTROSE 2-4 GM/100ML-% IV SOLN
INTRAVENOUS | Status: AC
  Filled 2023-11-09: qty 100

## 2023-11-09 MED ORDER — MIDAZOLAM HCL 2 MG/2ML IJ SOLN
INTRAMUSCULAR | Status: AC
  Filled 2023-11-09: qty 2

## 2023-11-09 SURGICAL SUPPLY — 35 items
BAG COUNTER SPONGE SURGICOUNT (BAG) ×1 IMPLANT
BNDG COMPR ESMARK 4X3 LF (GAUZE/BANDAGES/DRESSINGS) ×1 IMPLANT
BNDG ELASTIC 3INX 5YD STR LF (GAUZE/BANDAGES/DRESSINGS) ×1 IMPLANT
BNDG ELASTIC 4X5.8 VLCR STR LF (GAUZE/BANDAGES/DRESSINGS) ×1 IMPLANT
BNDG GAUZE DERMACEA FLUFF 4 (GAUZE/BANDAGES/DRESSINGS) ×1 IMPLANT
CHLORAPREP W/TINT 26 (MISCELLANEOUS) ×1 IMPLANT
CORD BIPOLAR FORCEPS 12FT (ELECTRODE) ×1 IMPLANT
COVER SURGICAL LIGHT HANDLE (MISCELLANEOUS) ×1 IMPLANT
CUFF TOURN SGL QUICK 18X4 (TOURNIQUET CUFF) ×1 IMPLANT
CUFF TRNQT CYL 24X4X16.5-23 (TOURNIQUET CUFF) IMPLANT
DRAPE SURG 17X23 STRL (DRAPES) ×1 IMPLANT
GAUZE PAD ABD 8X10 STRL (GAUZE/BANDAGES/DRESSINGS) ×2 IMPLANT
GAUZE SPONGE 4X4 12PLY STRL (GAUZE/BANDAGES/DRESSINGS) ×1 IMPLANT
GAUZE XEROFORM 1X8 LF (GAUZE/BANDAGES/DRESSINGS) ×1 IMPLANT
GLOVE BIO SURGEON STRL SZ7.5 (GLOVE) ×2 IMPLANT
GLOVE BIOGEL PI IND STRL 8 (GLOVE) ×1 IMPLANT
GOWN STRL REUS W/ TWL LRG LVL3 (GOWN DISPOSABLE) ×2 IMPLANT
KIT BASIN OR (CUSTOM PROCEDURE TRAY) ×1 IMPLANT
KIT TURNOVER KIT B (KITS) ×1 IMPLANT
NDL HYPO 25GX1X1/2 BEV (NEEDLE) IMPLANT
NEEDLE HYPO 25GX1X1/2 BEV (NEEDLE) IMPLANT
NS IRRIG 1000ML POUR BTL (IV SOLUTION) ×1 IMPLANT
PACK ORTHO EXTREMITY (CUSTOM PROCEDURE TRAY) ×1 IMPLANT
PAD ARMBOARD POSITIONER FOAM (MISCELLANEOUS) ×2 IMPLANT
PADDING CAST ABS COTTON 4X4 ST (CAST SUPPLIES) ×1 IMPLANT
SPONGE T-LAP 4X18 ~~LOC~~+RFID (SPONGE) ×1 IMPLANT
SUCTION TUBE FRAZIER 10FR DISP (SUCTIONS) IMPLANT
SUT ETHILON 3 0 PS 1 (SUTURE) ×1 IMPLANT
SUT VIC AB 3-0 SH 18 (SUTURE) ×1 IMPLANT
SYR CONTROL 10ML LL (SYRINGE) IMPLANT
TOWEL GREEN STERILE (TOWEL DISPOSABLE) ×1 IMPLANT
TOWEL GREEN STERILE FF (TOWEL DISPOSABLE) ×1 IMPLANT
TUBE CONNECTING 12X1/4 (SUCTIONS) IMPLANT
UNDERPAD 30X36 HEAVY ABSORB (UNDERPADS AND DIAPERS) ×1 IMPLANT
WATER STERILE IRR 1000ML POUR (IV SOLUTION) ×1 IMPLANT

## 2023-11-09 NOTE — Anesthesia Postprocedure Evaluation (Signed)
 Anesthesia Post Note  Patient: John Mcclain  Procedure(s) Performed: CARPAL TUNNEL RELEASE (Right: Wrist)     Patient location during evaluation: PACU Anesthesia Type: General Level of consciousness: awake and alert Pain management: pain level controlled Vital Signs Assessment: post-procedure vital signs reviewed and stable Respiratory status: spontaneous breathing, nonlabored ventilation, respiratory function stable and patient connected to nasal cannula oxygen Cardiovascular status: blood pressure returned to baseline and stable Postop Assessment: no apparent nausea or vomiting Anesthetic complications: no   No notable events documented.  Last Vitals:  Vitals:   11/09/23 1330 11/09/23 1645  BP: (!) 180/85 (!) 157/66  Pulse:  67  Resp:  10  Temp:  (!) 36.4 C  SpO2:  100%    Last Pain:  Vitals:   11/09/23 1645  TempSrc:   PainSc: 0-No pain                 Lethaniel Rave

## 2023-11-09 NOTE — Anesthesia Procedure Notes (Signed)
 Procedure Name: LMA Insertion Date/Time: 11/09/2023 2:13 PM  Performed by: Philipe Laswell, CRNAPre-anesthesia Checklist: Patient identified, Emergency Drugs available, Suction available and Patient being monitored Patient Re-evaluated:Patient Re-evaluated prior to induction Oxygen Delivery Method: Circle System Utilized Preoxygenation: Pre-oxygenation with 100% oxygen Induction Type: IV induction Ventilation: Mask ventilation without difficulty LMA: LMA inserted LMA Size: 4.0 Number of attempts: 1 Airway Equipment and Method: Bite block Placement Confirmation: positive ETCO2 Tube secured with: Tape Dental Injury: Teeth and Oropharynx as per pre-operative assessment  Comments: Teeth, lips, and tongue same as prior to induction. Head and neck midline.

## 2023-11-09 NOTE — Discharge Instructions (Signed)

## 2023-11-09 NOTE — Anesthesia Preprocedure Evaluation (Signed)
 Anesthesia Evaluation  Patient identified by MRN, date of birth, ID band Patient awake    Reviewed: Allergy & Precautions, NPO status , Patient's Chart, lab work & pertinent test results  Airway Mallampati: II  TM Distance: >3 FB Neck ROM: Full    Dental  (+) Teeth Intact, Dental Advisory Given   Pulmonary    breath sounds clear to auscultation       Cardiovascular hypertension, + Valvular Problems/Murmurs  Rhythm:Regular Rate:Normal     Neuro/Psych  Headaches PSYCHIATRIC DISORDERS Anxiety Depression       GI/Hepatic Neg liver ROS,GERD  Medicated,,  Endo/Other  negative endocrine ROS    Renal/GU negative Renal ROS     Musculoskeletal  (+) Arthritis , Rheumatoid disorders,    Abdominal   Peds  Hematology negative hematology ROS (+)   Anesthesia Other Findings   Reproductive/Obstetrics                             Anesthesia Physical Anesthesia Plan  ASA: 2  Anesthesia Plan: General   Post-op Pain Management: Tylenol  PO (pre-op)* and Toradol  IV (intra-op)*   Induction: Intravenous  PONV Risk Score and Plan: 3 and Ondansetron , Dexamethasone  and Midazolam   Airway Management Planned: LMA  Additional Equipment: None  Intra-op Plan:   Post-operative Plan: Extubation in OR  Informed Consent: I have reviewed the patients History and Physical, chart, labs and discussed the procedure including the risks, benefits and alternatives for the proposed anesthesia with the patient or authorized representative who has indicated his/her understanding and acceptance.     Dental advisory given  Plan Discussed with: CRNA  Anesthesia Plan Comments:        Anesthesia Quick Evaluation

## 2023-11-09 NOTE — Transfer of Care (Signed)
 Immediate Anesthesia Transfer of Care Note  Patient: John Mcclain  Procedure(s) Performed: CARPAL TUNNEL RELEASE (Right: Wrist)  Patient Location: PACU  Anesthesia Type:General  Level of Consciousness: awake, alert , and oriented  Airway & Oxygen Therapy: Patient Spontanous Breathing and Patient connected to face mask oxygen  Post-op Assessment: Report given to RN and Post -op Vital signs reviewed and stable  Post vital signs: Reviewed and stable  Last Vitals:  Vitals Value Taken Time  BP 157/66 11/09/23 1645  Temp    Pulse 71 11/09/23 1648  Resp 16 11/09/23 1648  SpO2 97 % 11/09/23 1648  Vitals shown include unfiled device data.  Last Pain:  Vitals:   11/09/23 1327  TempSrc: Oral  PainSc: 0-No pain      Patients Stated Pain Goal: 1 (11/09/23 1327)  Complications: No notable events documented.

## 2023-11-09 NOTE — Op Note (Signed)
 11/09/2023 [facility] OR                              OPERATIVE REPORT   PREOPERATIVE DIAGNOSIS:  Right carpal tunnel syndrome  POSTOPERATIVE DIAGNOSIS:  Right carpal tunnel syndrome  PROCEDURE:  Right carpal tunnel release  SURGEON:  Brunilda Capra, MD  ASSISTANT:  none.  ANESTHESIA: General  IV FLUIDS:  Per anesthesia flow sheet  ESTIMATED BLOOD LOSS:  Minimal  COMPLICATIONS:  None  SPECIMENS:  None  TOURNIQUET TIME:    Total Tourniquet Time Documented: Upper Arm (Right) - 12 minutes Total: Upper Arm (Right) - 12 minutes   DISPOSITION:  Stable to PACU  LOCATION: [facility] OR  INDICATIONS:  68 y.o. yo male with numbness and tingling right hand.  Positive nerve conduction studies. He wishes to proceed with right carpal tunnel release.  Risks, benefits and alternatives of surgery were discussed including the risk of blood loss; infection; damage to nerves, vessels, tendons, ligaments, bone; failure of surgery; need for additional surgery; complications with wound healing; continued pain; recurrence of carpal tunnel syndrome; and damage to motor branch. He voiced understanding of these risks and elected to proceed.   OPERATIVE COURSE:  After being identified preoperatively by myself, the patient and I agreed upon the procedure and site of procedure.  The surgical site was marked.  Surgical consent had been signed.  He was given IV Ancef as preoperative antibiotic prophylaxis.  He was transferred to the operating room and placed on the operating room table in supine position with the Right upper extremity on an armboard.  General anesthesia was induced by the anesthesiologist.  Right upper extremity was prepped and draped in normal sterile orthopaedic fashion.  A surgical pause was performed between the surgeons, anesthesia, and operating room staff, and all were in agreement as to the patient, procedure, and site of procedure.  Tourniquet at the proximal aspect of the extremity was inflated to 250  mmHg after exsanguination of the arm with an Esmarch bandage  Incision was made over the transverse carpal ligament and carried into the subcutaneous tissues by spreading technique.  Bipolar electrocautery was used to obtain hemostasis.  The palmar fascia was sharply incised.  The transverse carpal ligament was identified.  The fascia distal to the ligament was opened.  Retractor was placed and the flexor tendons were identified.  The flexor tendon to the little finger was identified and retracted radially.  The transverse carpal ligament was then incised from distal to proximal under direct visualization.  Scissors were used to split the distal aspect of the volar antebrachial fascia.  A finger was placed into the wound to ensure complete decompression, which was the case.  The nerve was examined.  It was flattened and hyperemic.  The motor branch was identified and was intact.  The wound was copiously irrigated with sterile saline.  It was then closed with 4-0 nylon in a horizontal mattress fashion.  It was injected with 0.25% plain Marcaine  to aid in postoperative analgesia.  It was dressed with sterile Xeroform, 4x4s, an ABD, and wrapped with Kerlix and an Ace bandage.  Tourniquet was deflated at 12 minutes.  Fingertips were pink with brisk capillary refill after deflation of the tourniquet.  Operative drapes were broken down.  The patient was awoken from anesthesia safely.  He was transferred back to stretcher and taken to the PACU in stable condition.  I will see him back in  the office in 1 week for postoperative followup.  I will give him a prescription for Norco 5/325 1 tab PO q6 hours prn pain, dispense #15.    Takeria Marquina, MD Electronically signed, 11/09/23

## 2023-11-09 NOTE — H&P (Signed)
 John Mcclain is an 68 y.o. male.   Chief Complaint: carpal tunnel syndrome HPI: 68 y.o. yo male with numbness and tingling right hand.  Positive nerve conduction studies. He wishes to have right carpal tunnel release.   Allergies:  Allergies  Allergen Reactions   Penicillins Rash    Past Medical History:  Diagnosis Date   Allergy 1959   Anxiety    Arthritis    RA   Back pain    Blood transfusion without reported diagnosis 08/14/2022   Chronic migraine    Depression    GERD (gastroesophageal reflux disease)    Heart murmur    History of kidney stones    Hypertension    Pneumonia    Raynaud's disease    Rheumatoid arthritis (HCC)    Ulcer     Past Surgical History:  Procedure Laterality Date   ABDOMINAL EXPOSURE N/A 08/04/2022   Procedure: ABDOMINAL EXPOSURE;  Surgeon: Young Hensen, MD;  Location: [facility] OR;  Service: Vascular;  Laterality: N/A;   ANTERIOR LAT LUMBAR FUSION N/A 08/04/2022   Procedure: Anterior Lumbar Interbody Fusion Lumbar Five-Sacral One;  Surgeon: Pincus Bridgeman, DO;  Location: [facility] OR;  Service: Neurosurgery;  Laterality: N/A;   ANTERIOR LAT LUMBAR FUSION N/A 08/04/2022   Procedure: ANTERIOR LATERAL INTERBODY FUSION LUMBAR TWO-THREE, LUMBAR THREE-FOUR, LUMBAR FOUR-FIVE;  Surgeon: Dawley, Colby Daub, DO;  Location: [facility] OR;  Service: Neurosurgery;  Laterality: N/A;   COLON SURGERY  2000   polyps removed   COLONOSCOPY WITH ESOPHAGOGASTRODUODENOSCOPY (EGD)  2023   ESOPHAGEAL DILATION  2023   LAMINECTOMY WITH POSTERIOR LATERAL ARTHRODESIS LEVEL 3 N/A 08/08/2022   Procedure: OPEN THORACOLUMBAR INTRUMENTATION,FUSION, THORACIC TEN-PELVIS, LUMBAR THREE-SACRAL ONE DECOMPRESSION  LUMBAR TWO-LUMBAR FIVE POSTERIOR COLUMN OSTEOTOMIES;  Surgeon: Dawley, Colby Daub, DO;  Location: [facility] OR;  Service: Neurosurgery;  Laterality: N/A;   LYMPH NODE DISSECTION     removed lymph node   SPINE SURGERY  06/05/22&06/09/22   VASECTOMY      Family History: Family History  Problem  Relation Age of Onset   Kidney disease Mother    Hypertension Mother    Arthritis Mother        RA   COPD Mother    Hyperlipidemia Mother    Heart attack Father    Diabetes Father    COPD Father    Hypertension Father    Heart disease Father    Migraines Brother    Stroke Maternal Grandfather    Alzheimer's disease Paternal Grandmother    Colon cancer Neg Hx    Pancreatic cancer Neg Hx    Esophageal cancer Neg Hx    Stomach cancer Neg Hx     Social History:   reports that he has never smoked. He has never used smokeless tobacco. He reports that he does not currently use alcohol. He reports that he does not use drugs.  Medications: Medications Prior to Admission  Medication Sig Dispense Refill   alendronate (FOSAMAX) 70 MG tablet Take 70 mg by mouth every Saturday. Take with a full glass of water on an empty stomach.     amLODipine  (NORVASC ) 5 MG tablet Take 1 tablet (5 mg total) by mouth daily. 90 tablet 3   Calcium  Carbonate-Vitamin D  (CALCIUM  600+D PO) Take 1 tablet by mouth daily.     cyclobenzaprine (FLEXERIL) 10 MG tablet Take 10 mg by mouth 3 (three) times daily as needed for muscle spasms.     Fremanezumab-vfrm (AJOVY) 225 MG/1.5ML SOAJ Inject 225  mg into the skin every 30 (thirty) days.     levETIRAcetam  (KEPPRA ) 750 MG tablet Take 750 mg by mouth 2 (two) times daily.      LORazepam  (ATIVAN ) 1 MG tablet TAKE 1/2 TO 1 TABLET EVERY DAY AS NEEDED FOR ANXIETY AS SPARINGLY AS POSSIBLE 90 tablet 1   Magnesium 400 MG TABS Take 400 mg by mouth every evening.     pantoprazole  (PROTONIX ) 40 MG tablet Take 1 tablet (40 mg total) by mouth daily. Patient needs follow up appointment for future refills. Please call [phone] to schedule an appointment. 90 tablet 0   rosuvastatin  (CRESTOR ) 5 MG tablet TAKE 1 TABLET EVERY DAY 90 tablet 3   Upadacitinib ER (RINVOQ) 15 MG TB24 Take 15 mg by mouth daily.     Vitamins-Lipotropics (B COMPLEX FORMULA 1, LIPOTROP,) TABS Take 1 tablet by  mouth daily.     acetaminophen  (TYLENOL ) 500 MG tablet Take 500 mg by mouth every 6 (six) hours as needed for moderate pain.     clobetasol  (OLUX ) 0.05 % topical foam Apply topically 2 (two) times daily. (Patient taking differently: Apply 1 Application topically daily as needed (psoriasis).) 50 g 1   hydrocortisone  (ANUSOL -HC) 2.5 % rectal cream Place 1 Application rectally 2 (two) times daily. 30 g 2   polyethylene glycol powder (GLYCOLAX /MIRALAX ) powder Take 17 g by mouth daily. 3350 g prn   sildenafil  (VIAGRA ) 50 MG tablet Take 50 mg by mouth as needed.      No results found for this or any previous visit (from the past 48 hours).  No results found.    Height 5\' 5"  (1.651 m), weight 73.9 kg.  General appearance: alert, cooperative, and appears stated age Head: Normocephalic, without obvious abnormality, atraumatic Neck: supple, symmetrical, trachea midline Extremities: Intact sensation and capillary refill all digits.  +epl/fpl/io.  No wounds.  Skin: Skin color, texture, turgor normal. No rashes or lesions Neurologic: Grossly normal Incision/Wound: none  Assessment/Plan Right carpal tunnel syndrome. Non operative and operative treatment options have been discussed with the patient and patient wishes to proceed with operative treatment. Risks, benefits and alternatives of surgery were discussed including risks of blood loss, infection, damage to nerves/vessels/tendons/ligament/bone, failure of surgery, need for additional surgery, complication with wound healing, stiffness, damage to motor branch, recurrence.  He voiced understanding of these risks and elected to proceed.    Melissia Lahman 11/09/2023, 1:19 PM

## 2023-11-10 ENCOUNTER — Encounter (HOSPITAL_COMMUNITY): Payer: Self-pay | Admitting: Orthopedic Surgery

## 2023-11-15 ENCOUNTER — Ambulatory Visit: Payer: Self-pay | Admitting: Surgery

## 2023-11-15 DIAGNOSIS — K429 Umbilical hernia without obstruction or gangrene: Secondary | ICD-10-CM | POA: Diagnosis not present

## 2023-11-15 NOTE — H&P (View-Only) (Signed)
 John Mcclain [id number]   Referring Provider:  Maryagnes Small, MD   Subjective   Chief Complaint: New Consultation (Umbilical hernia)     History of Present Illness:    68 year old man with history of rheumatoid arthritis, Raynaud's disease, hypertension, GERD, depression/anxiety, migraines, arthritis, hemorrhoids with recent banding in mid May by Dr. Karene Oto and previous abdominal surgery including abdominal exposure for anterior lateral lumbar fusion in March 2024 complicated by left pelvic DVT who presents with an umbilical hernia.  This has been present for 5 or 6 years.  It has slightly increased in size but does not cause him significant pain.  No associated GI symptoms.  He has stayed very active after his back surgery and does a lot of core exercises. He had a CT scan done in March 2024 which I reviewed personally.  This does demonstrate a 1 cm fascial aperture fat-containing umbilical hernia  Review of Systems: A complete review of systems was obtained from the patient.  I have reviewed this information and discussed as appropriate with the patient.  See HPI as well for other ROS.   Medical History: Past Medical History:  Diagnosis Date   Anxiety    Arthritis    DVT (deep venous thrombosis) (CMS/HHS-HCC)    GERD (gastroesophageal reflux disease)    Hypertension     There is no problem list on file for this patient.   Past Surgical History:  Procedure Laterality Date   back surgery     carpal tunnel surgery     COLON SURGERY       Allergies  Allergen Reactions   Penicillins Rash    Only as a child and has had amoxicillin  and no reaction    Current Outpatient Medications on File Prior to Visit  Medication Sig Dispense Refill   alendronate (FOSAMAX) 70 MG tablet Take 70 mg by mouth     amLODIPine  (NORVASC ) 5 MG tablet Take 5 mg by mouth once daily     calcium  carbonate/vitamin D3 (CALCIUM  600 + D,3, ORAL)      cyclobenzaprine (FLEXERIL) 10 MG tablet  Take 10 mg by mouth 3 (three) times daily as needed for Muscle spasms     fremanezumab-vfrm (AJOVY AUTOINJECTOR) 225 mg/1.5 mL AtIn Inject subcutaneously monthly     levETIRAcetam  (KEPPRA ) 750 MG tablet Take 750 mg by mouth 2 (two) times daily     LORazepam  (ATIVAN ) 1 MG tablet TAKE 1/2 TO 1 TABLET EVERY DAY AS NEEDED FOR ANXIETY AS SPARINGLY AS POSSIBLE     magnesium glycinate 100 mg capsule Take 400 mg by mouth     pantoprazole  (PROTONIX ) 40 MG DR tablet Take 40 mg by mouth     polyethylene glycol (MIRALAX ) packet      rosuvastatin  (CRESTOR ) 5 MG tablet Take 5 mg by mouth once daily     upadacitinib (RINVOQ) 15 mg extended release tablet      vitamin B complex (B COMPLEX ORAL) Take 1 tablet by mouth once daily     No current facility-administered medications on file prior to visit.    Family History  Problem Relation Age of Onset   High blood pressure (Hypertension) Mother    Hyperlipidemia (Elevated cholesterol) Mother    High blood pressure (Hypertension) Father    Coronary Artery Disease (Blocked arteries around heart) Father      Social History   Tobacco Use  Smoking Status Never  Smokeless Tobacco Never     Social History   Socioeconomic History  Marital status: Married  Tobacco Use   Smoking status: Never   Smokeless tobacco: Never  Vaping Use   Vaping status: Never Used  Substance and Sexual Activity   Alcohol use: Yes    Alcohol/week: 0.0 - 1.0 standard drinks of alcohol   Drug use: Never   Social Drivers of Corporate investment banker Strain: Low Risk  (10/03/2023)   Received from [facility]   Overall Financial Resource Strain (CARDIA)    Difficulty of Paying Living Expenses: Not hard at all  Food Insecurity: No Food Insecurity (10/03/2023)   [facility]   Hunger Vital Sign    Within the past 12 months, you worried that your food would run out before you got the money to buy more.: Never true    Within the past 12 months, the food you  bought just didn't last and you didn't have money to get more.: Never true  Transportation Needs: No Transportation Needs (10/03/2023)   Received from [facility]    Lack of Transportation (Medical): No    Lack of Transportation (Non-Medical): No  Physical Activity: Unknown (10/03/2023)   Received from [facility]   Exercise Vital Sign    On average, how many days per week do you engage in moderate to strenuous exercise (like a brisk walk)?: Patient declined  Stress: No Stress Concern Present (10/03/2023)   Received from [facility] - Occupational Stress Questionnaire    Feeling of Stress : Only a little  Social Connections: Unknown (10/03/2023)   Received from [facility]   Social Connection and Isolation Panel    In a typical week, how many times do you talk on the phone with family, friends, or neighbors?: Patient declined    How often do you get together with friends or relatives?: Once a week    How often do you attend church or religious services?: Patient declined    Do you belong to any clubs or organizations such as church groups, unions, fraternal or athletic groups, or school groups?: No    Are you married, widowed, divorced, separated, never married, or living with a partner?: Married  Housing Stability: Unknown (11/15/2023)   Housing Stability Vital Sign    Homeless in the Last Year: No    Objective:    Vitals:   11/15/23 1113  BP: (!) 142/82  Pulse: 91  Temp: 36.7 C (98 F)  SpO2: 99%  Weight: 71.5 kg (157 lb 9.6 oz)  Height: 167.6 cm (5' 6)  PainSc: 0-No pain    Body mass index is 25.44 kg/m.  Gen: A&Ox3, no distress  Chest: respiratory effort is normal. Abdomen: soft, nondistended, nontender.  Partially reducible umbilical hernia with a fascial aperture approximately 1.5 cm in diameter, minimally tender. Neuro: no gross deficit Psych: appropriate mood and affect, normal insight/judgment  intact  Skin: warm and dry    Assessment and Plan:  Diagnoses and all orders for this visit:  Umbilical hernia without obstruction and without gangrene    We discussed the relevant anatomy and we discussed options for repair.  I recommend an open approach with possible mesh and went over the technique of the procedure.  Discussed risks of bleeding, infection, pain, scarring, injury to intraabdominal structures; risk of chronic pain, hernia recurrence, risk of seroma or hematoma, urinary retention, and risks of general anesthesia including cardiovascular, pulmonary, and thromboembolic complications.  We discussed typical postop recovery,  timeline, and activity limitations.  We also discussed the option of ongoing observation, with high rate of ultimately returning for surgery and risk of increasing size/symptoms from the hernia as well as incarceration/strangulation and went over symptoms that should prompt the patient to seek emergency treatment.  Questions were welcomed and answered to the patient's satisfaction. Patient wishes to proceed with scheduling.     Aldon Hung MD FACS

## 2023-11-15 NOTE — H&P (Signed)
 John Mcclain [id number]   Referring Provider:  Maryagnes Small, MD   Subjective   Chief Complaint: New Consultation (Umbilical hernia)     History of Present Illness:    68 year old man with history of rheumatoid arthritis, Raynaud's disease, hypertension, GERD, depression/anxiety, migraines, arthritis, hemorrhoids with recent banding in mid May by Dr. Karene Oto and previous abdominal surgery including abdominal exposure for anterior lateral lumbar fusion in March 2024 complicated by left pelvic DVT who presents with an umbilical hernia.  This has been present for 5 or 6 years.  It has slightly increased in size but does not cause him significant pain.  No associated GI symptoms.  He has stayed very active after his back surgery and does a lot of core exercises. He had a CT scan done in March 2024 which I reviewed personally.  This does demonstrate a 1 cm fascial aperture fat-containing umbilical hernia  Review of Systems: A complete review of systems was obtained from the patient.  I have reviewed this information and discussed as appropriate with the patient.  See HPI as well for other ROS.   Medical History: Past Medical History:  Diagnosis Date   Anxiety    Arthritis    DVT (deep venous thrombosis) (CMS/HHS-HCC)    GERD (gastroesophageal reflux disease)    Hypertension     There is no problem list on file for this patient.   Past Surgical History:  Procedure Laterality Date   back surgery     carpal tunnel surgery     COLON SURGERY       Allergies  Allergen Reactions   Penicillins Rash    Only as a child and has had amoxicillin  and no reaction    Current Outpatient Medications on File Prior to Visit  Medication Sig Dispense Refill   alendronate (FOSAMAX) 70 MG tablet Take 70 mg by mouth     amLODIPine  (NORVASC ) 5 MG tablet Take 5 mg by mouth once daily     calcium  carbonate/vitamin D3 (CALCIUM  600 + D,3, ORAL)      cyclobenzaprine (FLEXERIL) 10 MG tablet  Take 10 mg by mouth 3 (three) times daily as needed for Muscle spasms     fremanezumab-vfrm (AJOVY AUTOINJECTOR) 225 mg/1.5 mL AtIn Inject subcutaneously monthly     levETIRAcetam  (KEPPRA ) 750 MG tablet Take 750 mg by mouth 2 (two) times daily     LORazepam  (ATIVAN ) 1 MG tablet TAKE 1/2 TO 1 TABLET EVERY DAY AS NEEDED FOR ANXIETY AS SPARINGLY AS POSSIBLE     magnesium glycinate 100 mg capsule Take 400 mg by mouth     pantoprazole  (PROTONIX ) 40 MG DR tablet Take 40 mg by mouth     polyethylene glycol (MIRALAX ) packet      rosuvastatin  (CRESTOR ) 5 MG tablet Take 5 mg by mouth once daily     upadacitinib (RINVOQ) 15 mg extended release tablet      vitamin B complex (B COMPLEX ORAL) Take 1 tablet by mouth once daily     No current facility-administered medications on file prior to visit.    Family History  Problem Relation Age of Onset   High blood pressure (Hypertension) Mother    Hyperlipidemia (Elevated cholesterol) Mother    High blood pressure (Hypertension) Father    Coronary Artery Disease (Blocked arteries around heart) Father      Social History   Tobacco Use  Smoking Status Never  Smokeless Tobacco Never     Social History   Socioeconomic History  Marital status: Married  Tobacco Use   Smoking status: Never   Smokeless tobacco: Never  Vaping Use   Vaping status: Never Used  Substance and Sexual Activity   Alcohol use: Yes    Alcohol/week: 0.0 - 1.0 standard drinks of alcohol   Drug use: Never   Social Drivers of Corporate investment banker Strain: Low Risk  (10/03/2023)   Received from [facility]   Overall Financial Resource Strain (CARDIA)    Difficulty of Paying Living Expenses: Not hard at all  Food Insecurity: No Food Insecurity (10/03/2023)   [facility]   Hunger Vital Sign    Within the past 12 months, you worried that your food would run out before you got the money to buy more.: Never true    Within the past 12 months, the food you  bought just didn't last and you didn't have money to get more.: Never true  Transportation Needs: No Transportation Needs (10/03/2023)   Received from [facility]    Lack of Transportation (Medical): No    Lack of Transportation (Non-Medical): No  Physical Activity: Unknown (10/03/2023)   Received from [facility]   Exercise Vital Sign    On average, how many days per week do you engage in moderate to strenuous exercise (like a brisk walk)?: Patient declined  Stress: No Stress Concern Present (10/03/2023)   Received from [facility] - Occupational Stress Questionnaire    Feeling of Stress : Only a little  Social Connections: Unknown (10/03/2023)   Received from [facility]   Social Connection and Isolation Panel    In a typical week, how many times do you talk on the phone with family, friends, or neighbors?: Patient declined    How often do you get together with friends or relatives?: Once a week    How often do you attend church or religious services?: Patient declined    Do you belong to any clubs or organizations such as church groups, unions, fraternal or athletic groups, or school groups?: No    Are you married, widowed, divorced, separated, never married, or living with a partner?: Married  Housing Stability: Unknown (11/15/2023)   Housing Stability Vital Sign    Homeless in the Last Year: No    Objective:    Vitals:   11/15/23 1113  BP: (!) 142/82  Pulse: 91  Temp: 36.7 C (98 F)  SpO2: 99%  Weight: 71.5 kg (157 lb 9.6 oz)  Height: 167.6 cm (5' 6)  PainSc: 0-No pain    Body mass index is 25.44 kg/m.  Gen: A&Ox3, no distress  Chest: respiratory effort is normal. Abdomen: soft, nondistended, nontender.  Partially reducible umbilical hernia with a fascial aperture approximately 1.5 cm in diameter, minimally tender. Neuro: no gross deficit Psych: appropriate mood and affect, normal insight/judgment  intact  Skin: warm and dry    Assessment and Plan:  Diagnoses and all orders for this visit:  Umbilical hernia without obstruction and without gangrene    We discussed the relevant anatomy and we discussed options for repair.  I recommend an open approach with possible mesh and went over the technique of the procedure.  Discussed risks of bleeding, infection, pain, scarring, injury to intraabdominal structures; risk of chronic pain, hernia recurrence, risk of seroma or hematoma, urinary retention, and risks of general anesthesia including cardiovascular, pulmonary, and thromboembolic complications.  We discussed typical postop recovery,  timeline, and activity limitations.  We also discussed the option of ongoing observation, with high rate of ultimately returning for surgery and risk of increasing size/symptoms from the hernia as well as incarceration/strangulation and went over symptoms that should prompt the patient to seek emergency treatment.  Questions were welcomed and answered to the patient's satisfaction. Patient wishes to proceed with scheduling.     Aldon Hung MD FACS

## 2023-11-16 NOTE — Progress Notes (Signed)
 For Anesthesia: PCP - Elvira Hammersmith, MD  Cardiologist -   Bowel Prep reminder:  Chest x-ray -  EKG -  Stress Test -  ECHO -  Cardiac Cath -  Pacemaker/ICD device last checked: Pacemaker orders received: Device Rep notified:  Spinal Cord Stimulator:  Sleep Study -  CPAP -   Fasting Blood Sugar -  Checks Blood Sugar _____ times a day Date and result of last Hgb A1c-  Last dose of GLP1 agonist-  GLP1 instructions:   Last dose of SGLT-2 inhibitors-  SGLT-2 instructions:   Blood Thinner Instructions: Aspirin Instructions: Last Dose:  Activity level: Can go up a flight of stairs and activities of daily living without stopping and without chest pain and/or shortness of breath   Able to exercise without chest pain and/or shortness of breath   Unable to go up a flight of stairs without chest pain and/or shortness of breath     Anesthesia review:   Patient denies shortness of breath, fever, cough and chest pain at PAT appointment   Patient verbalized understanding of instructions that were reviewed over the telephone.

## 2023-11-19 ENCOUNTER — Encounter (HOSPITAL_COMMUNITY): Payer: Self-pay | Admitting: Surgery

## 2023-11-19 DIAGNOSIS — G43719 Chronic migraine without aura, intractable, without status migrainosus: Secondary | ICD-10-CM | POA: Diagnosis not present

## 2023-11-19 NOTE — Progress Notes (Signed)
 Patient phoned to give updated information on surgery.  Date of Surgery - 11-22-23  Arrival Time - 5:15 AM and check in at admitting.    NPO Status - patient reminded to not eat solid food or drink liquids after midnight.   Medications morning of surgery - Amlodipine , Keppra , Protonix  and Rosuvastatin   No change in medical history, allergies per patient since surgery on 11-09-23.  Transportation home - [place] [phone]  All questions answered and patient stated understanding

## 2023-11-19 NOTE — Progress Notes (Addendum)
 COVID Vaccine Completed:  Date of COVID positive in last 90 days:  PCP - Maryagnes Small, MD Cardiologist - N/A  Chest x-ray - N/A EKG - 11-07-23 Epic Stress Test - N/A ECHO - N/A Cardiac Cath - N/A Pacemaker/ICD device last checked:N/A Spinal Cord Stimulator:N/A  Bowel Prep - N/A  Sleep Study - N/A CPAP -   Fasting Blood Sugar - N/A Checks Blood Sugar _____ times a day  Last dose of GLP1 agonist-  N/A GLP1 instructions:  Hold 7 days before surgery    Last dose of SGLT-2 inhibitors-  N/A SGLT-2 instructions:  Hold 3 days before surgery   Blood Thinner Instructions: N/A Aspirin Instructions: Last Dose:  Activity level:  Can go up a flight of stairs and perform activities of daily living without stopping and without symptoms of chest pain or shortness of breath.  Anesthesia review: Murmur  Patient denies shortness of breath, fever, cough and chest pain at PAT appointment (completed over the phone)  Patient verbalized understanding of instructions that were given to them at the PAT appointment. Patient was also instructed that they will need to review over the PAT instructions again at home before surgery.

## 2023-11-20 ENCOUNTER — Encounter (HOSPITAL_BASED_OUTPATIENT_CLINIC_OR_DEPARTMENT_OTHER): Payer: Self-pay

## 2023-11-20 NOTE — Progress Notes (Signed)
 Case: [id number] Date/Time: 11/22/23 0715   Procedure: REPAIR, HERNIA, UMBILICAL, ADULT   Anesthesia type: General   Diagnosis: Umbilical hernia without obstruction or gangrene [K42.9]   Pre-op diagnosis: UMBILICAL HERNIA   Location: [facility] ROOM 04 / [facility]   Surgeons: Adalberto Acton, MD       DISCUSSION:  John Mcclain is a 68 yo male who is being evaluated prior to umbilical hernia repair on 11/22/23 with Dr. Lanell Pinta. PMH of HTN, heart murmur, hx of DVT (08/2022 - provoked after spine surgery) migraines, GERD, RA, anxiety, depression, scoliosis s/p lumbar fusion L5-S1 (08/04/2022) and thoraco-lumbar fusion (08/08/2022).  Of note patient underwent carpal tunnel release on 11/09/23 with Dr. Huntley Mai without complications.  Pt evaluated in pre op in March 2024 due to hx of heart murmur. Thought to not be clinically significant at that time. Last seen by PCP, Dr. Vedia Geralds, on 10/04/23 due to leg cramps. Taking Magnesium with relief. Also reported high BP with dizziness and amlodipine  was increased. Otherwise no acute issues.  Has hx of RA and Raynaud's disease and takes Ajovy.  VS: There were no vitals taken for this visit.  PROVIDERS: Elvira Hammersmith, MD   LABS: DOS    IMAGES:   EKG 11/07/23:  Normal sinus rhythm Minimal voltage criteria for LVH, may be normal variant ( R in aVL ) Nonspecific ST and T wave abnormality Abnormal ECG When compared with ECG of 04-Aug-2022 06:15, No significant change since last tracing Interpretation limited secondary to artifact   Past Medical History:  Diagnosis Date   Allergy 1959   Anemia    Anxiety    Arthritis    RA   Back pain    Blood transfusion without reported diagnosis 08/14/2022   Chronic migraine    Depression    GERD (gastroesophageal reflux disease)    Heart murmur    History of kidney stones    Hypertension    Pneumonia    Raynaud's disease    Rheumatoid arthritis (HCC)    Ulcer     Past Surgical History:  Procedure  Laterality Date   ABDOMINAL EXPOSURE N/A 08/04/2022   Procedure: ABDOMINAL EXPOSURE;  Surgeon: Young Hensen, MD;  [facility]: [facility];  Service: Vascular;  Laterality: N/A;   ANTERIOR LAT LUMBAR FUSION N/A 08/04/2022   Procedure: Anterior Lumbar Interbody Fusion Lumbar Five-Sacral One;  Surgeon: Pincus Bridgeman, DO;  [facility];  Service: Neurosurgery;  Laterality: N/A;   ANTERIOR LAT LUMBAR FUSION N/A 08/04/2022   Procedure: ANTERIOR LATERAL INTERBODY FUSION LUMBAR TWO-THREE, LUMBAR THREE-FOUR, LUMBAR FOUR-FIVE;  Surgeon: Dawley, Colby Daub, DO;  Location: [facility];  Service: Neurosurgery;  Laterality: N/A;   CARPAL TUNNEL RELEASE Right 11/09/2023   Procedure: CARPAL TUNNEL RELEASE;  Surgeon: Brunilda Capra, MD;  [facility];  Service: Orthopedics;  Laterality: Right;   COLON SURGERY  2000   polyps removed   COLONOSCOPY WITH ESOPHAGOGASTRODUODENOSCOPY (EGD)  2023   ESOPHAGEAL DILATION  2023   LAMINECTOMY WITH POSTERIOR LATERAL ARTHRODESIS LEVEL 3 N/A 08/08/2022   Procedure: OPEN THORACOLUMBAR INTRUMENTATION,FUSION, THORACIC TEN-PELVIS, LUMBAR THREE-SACRAL ONE DECOMPRESSION  LUMBAR TWO-LUMBAR FIVE POSTERIOR COLUMN OSTEOTOMIES;  Surgeon: Dawley, Colby Daub, DO;  Location: [facility];  Service: Neurosurgery;  Laterality: N/A;   LYMPH NODE DISSECTION     removed lymph node   SPINE SURGERY  06/05/22&06/09/22   VASECTOMY      MEDICATIONS: No current facility-administered medications for this encounter.    alendronate (FOSAMAX) 70 MG tablet  amLODipine  (NORVASC ) 5 MG tablet   Calcium  Carbonate-Vitamin D  (CALCIUM  600+D PO)   clobetasol  (OLUX ) 0.05 % topical foam   cyclobenzaprine (FLEXERIL) 10 MG tablet   Fremanezumab-vfrm (AJOVY) 225 MG/1.5ML SOAJ   HYDROcodone -acetaminophen  (NORCO/VICODIN) 5-325 MG tablet   hydrocortisone  (ANUSOL -HC) 2.5 % rectal cream   levETIRAcetam  (KEPPRA ) 750 MG tablet   LORazepam  (ATIVAN ) 1 MG tablet   Magnesium 400 MG TABS   pantoprazole  (PROTONIX ) 40 MG tablet    polyethylene glycol powder (GLYCOLAX /MIRALAX ) powder   rosuvastatin  (CRESTOR ) 5 MG tablet   sildenafil  (VIAGRA ) 50 MG tablet   Upadacitinib ER (RINVOQ) 15 MG TB24   Vitamins-Lipotropics (B COMPLEX FORMULA 1, LIPOTROP,) TABS   Kimble Pennant, PA-C [facility] Surgical Short Stay/Anesthesiology [facility] Phone [phone] 11/20/2023 9:03 AM

## 2023-11-20 NOTE — Progress Notes (Signed)
 DISCUSSION: John Mcclain is a 68 yo male who is being evaluated prior to umbilical hernia repair on 11/22/23 with Dr. Lanell Pinta. PMH of HTN, heart murmur, migraines, GERD, RA, anxiety, depression, scoliosis s/p lumbar fusion L5-S1 (08/04/2022) and thoraco-lumbar fusion (08/08/2022).  Of note patient underwent carpal tunnel release on 11/09/23 with Dr. Huntley Mai without complications.  Pt evaluated in pre op in March 2024 due to hx of heart murmur. Thought to not be clinically significant at that time. Last seen by PCP, Dr. Vedia Geralds, on 10/04/23 due to leg cramps. Taking Magnesium with relief. Also reported high BP with dizziness and amlodipine  was increased. Otherwise no acute issues.  VS: There were no vitals taken for this visit.  PROVIDERS: Elvira Hammersmith, MD   LABS: DOS    IMAGES:   EKG 11/07/23:  Normal sinus rhythm Minimal voltage criteria for LVH, may be normal variant ( R in aVL ) Nonspecific ST and T wave abnormality Abnormal ECG When compared with ECG of 04-Aug-2022 06:15, No significant change since last tracing Interpretation limited secondary to artifact  CV:  Past Medical History:  Diagnosis Date   Allergy 1959   Anemia    Anxiety    Arthritis    RA   Back pain    Blood transfusion without reported diagnosis 08/14/2022   Chronic migraine    Depression    GERD (gastroesophageal reflux disease)    Heart murmur    History of kidney stones    Hypertension    Pneumonia    Raynaud's disease    Rheumatoid arthritis (HCC)    Ulcer     Past Surgical History:  Procedure Laterality Date   ABDOMINAL EXPOSURE N/A 08/04/2022   Procedure: ABDOMINAL EXPOSURE;  Surgeon: Young Hensen, MD;  [facility];  Service: Vascular;  Laterality: N/A;   ANTERIOR LAT LUMBAR FUSION N/A 08/04/2022   Procedure: Anterior Lumbar Interbody Fusion Lumbar Five-Sacral One;  Surgeon: Pincus Bridgeman, DO;  [facility];  Service: Neurosurgery;  Laterality: N/A;   ANTERIOR LAT LUMBAR  FUSION N/A 08/04/2022   Procedure: ANTERIOR LATERAL INTERBODY FUSION LUMBAR TWO-THREE, LUMBAR THREE-FOUR, LUMBAR FOUR-FIVE;  Surgeon: Dawley, Colby Daub, DO;  [facility];  Service: Neurosurgery;  Laterality: N/A;   CARPAL TUNNEL RELEASE Right 11/09/2023   Procedure: CARPAL TUNNEL RELEASE;  Surgeon: Brunilda Capra, MD;  [facility];  Service: Orthopedics;  Laterality: Right;   COLON SURGERY  2000   polyps removed   COLONOSCOPY WITH ESOPHAGOGASTRODUODENOSCOPY (EGD)  2023   ESOPHAGEAL DILATION  2023   LAMINECTOMY WITH POSTERIOR LATERAL ARTHRODESIS LEVEL 3 N/A 08/08/2022   Procedure: OPEN THORACOLUMBAR INTRUMENTATION,FUSION, THORACIC TEN-PELVIS, LUMBAR THREE-SACRAL ONE DECOMPRESSION  LUMBAR TWO-LUMBAR FIVE POSTERIOR COLUMN OSTEOTOMIES;  Surgeon: Dawley, Colby Daub, DO;  [facility];  Service: Neurosurgery;  Laterality: N/A;   LYMPH NODE DISSECTION     removed lymph node   SPINE SURGERY  06/05/22&06/09/22   VASECTOMY      MEDICATIONS:  alendronate (FOSAMAX) 70 MG tablet   amLODipine  (NORVASC ) 5 MG tablet   Calcium  Carbonate-Vitamin D  (CALCIUM  600+D PO)   clobetasol  (OLUX ) 0.05 % topical foam   cyclobenzaprine (FLEXERIL) 10 MG tablet   Fremanezumab-vfrm (AJOVY) 225 MG/1.5ML SOAJ   HYDROcodone -acetaminophen  (NORCO/VICODIN) 5-325 MG tablet   hydrocortisone  (ANUSOL -HC) 2.5 % rectal cream   levETIRAcetam  (KEPPRA ) 750 MG tablet   LORazepam  (ATIVAN ) 1 MG tablet   Magnesium 400 MG TABS   pantoprazole  (PROTONIX ) 40 MG tablet   polyethylene glycol powder (GLYCOLAX /MIRALAX )  powder   rosuvastatin  (CRESTOR ) 5 MG tablet   sildenafil  (VIAGRA ) 50 MG tablet   Upadacitinib ER (RINVOQ) 15 MG TB24   Vitamins-Lipotropics (B COMPLEX FORMULA 1, LIPOTROP,) TABS   No current facility-administered medications for this encounter.   Antoinette Kirschner [facility] Surgical Short Stay/Anesthesiology [facility] Phone [phone] 11/20/2023 9:01 AM

## 2023-11-21 ENCOUNTER — Other Ambulatory Visit: Payer: Self-pay | Admitting: Gastroenterology

## 2023-11-21 NOTE — Anesthesia Preprocedure Evaluation (Addendum)
 Anesthesia Evaluation  Patient identified by MRN, date of birth, ID band Patient awake    Reviewed: Allergy & Precautions, NPO status , Patient's Chart, lab work & pertinent test results  Airway Mallampati: II  TM Distance: >3 FB Neck ROM: Full    Dental no notable dental hx.    Pulmonary neg pulmonary ROS   Pulmonary exam normal        Cardiovascular hypertension, Pt. on medications + DVT  Normal cardiovascular exam     Neuro/Psych  Headaches PSYCHIATRIC DISORDERS Anxiety Depression     Neuromuscular disease    GI/Hepatic Neg liver ROS,GERD  Medicated and Controlled,,  Endo/Other  negative endocrine ROS    Renal/GU negative Renal ROS     Musculoskeletal  (+) Arthritis ,    Abdominal   Peds  Hematology negative hematology ROS (+)   Anesthesia Other Findings UMBILICAL HERNIA  Reproductive/Obstetrics                             Anesthesia Physical Anesthesia Plan  ASA: 2  Anesthesia Plan: General   Post-op Pain Management:    Induction: Intravenous  PONV Risk Score and Plan: 2 and Ondansetron , Dexamethasone  and Treatment may vary due to age or medical condition  Airway Management Planned: Oral ETT  Additional Equipment:   Intra-op Plan:   Post-operative Plan: Extubation in OR  Informed Consent: I have reviewed the patients History and Physical, chart, labs and discussed the procedure including the risks, benefits and alternatives for the proposed anesthesia with the patient or authorized representative who has indicated his/her understanding and acceptance.     Dental advisory given  Plan Discussed with: CRNA  Anesthesia Plan Comments:        Anesthesia Quick Evaluation

## 2023-11-22 ENCOUNTER — Ambulatory Visit (HOSPITAL_BASED_OUTPATIENT_CLINIC_OR_DEPARTMENT_OTHER): Payer: Self-pay | Admitting: Medical

## 2023-11-22 ENCOUNTER — Ambulatory Visit (HOSPITAL_COMMUNITY)
Admission: RE | Admit: 2023-11-22 | Discharge: 2023-11-22 | Disposition: A | Discharge status: Home / Self Care | Attending: Surgery | Admitting: Surgery

## 2023-11-22 ENCOUNTER — Other Ambulatory Visit: Payer: Self-pay

## 2023-11-22 ENCOUNTER — Encounter (HOSPITAL_COMMUNITY)
Admission: RE | Disposition: A | Discharge status: Home / Self Care | Payer: Self-pay | Source: Home / Self Care | Attending: Surgery

## 2023-11-22 ENCOUNTER — Ambulatory Visit (HOSPITAL_COMMUNITY): Payer: Self-pay | Admitting: Medical

## 2023-11-22 ENCOUNTER — Encounter (HOSPITAL_COMMUNITY): Payer: Self-pay | Admitting: Surgery

## 2023-11-22 DIAGNOSIS — M069 Rheumatoid arthritis, unspecified: Secondary | ICD-10-CM | POA: Diagnosis not present

## 2023-11-22 DIAGNOSIS — G43909 Migraine, unspecified, not intractable, without status migrainosus: Secondary | ICD-10-CM | POA: Diagnosis not present

## 2023-11-22 DIAGNOSIS — K219 Gastro-esophageal reflux disease without esophagitis: Secondary | ICD-10-CM | POA: Diagnosis not present

## 2023-11-22 DIAGNOSIS — K429 Umbilical hernia without obstruction or gangrene: Secondary | ICD-10-CM

## 2023-11-22 DIAGNOSIS — I10 Essential (primary) hypertension: Secondary | ICD-10-CM | POA: Diagnosis not present

## 2023-11-22 DIAGNOSIS — Z79899 Other long term (current) drug therapy: Secondary | ICD-10-CM | POA: Diagnosis not present

## 2023-11-22 DIAGNOSIS — D649 Anemia, unspecified: Secondary | ICD-10-CM

## 2023-11-22 DIAGNOSIS — F419 Anxiety disorder, unspecified: Secondary | ICD-10-CM | POA: Diagnosis not present

## 2023-11-22 DIAGNOSIS — F32A Depression, unspecified: Secondary | ICD-10-CM | POA: Insufficient documentation

## 2023-11-22 DIAGNOSIS — I7300 Raynaud's syndrome without gangrene: Secondary | ICD-10-CM | POA: Insufficient documentation

## 2023-11-22 DIAGNOSIS — Z86718 Personal history of other venous thrombosis and embolism: Secondary | ICD-10-CM | POA: Diagnosis not present

## 2023-11-22 HISTORY — DX: Anemia, unspecified: D64.9

## 2023-11-22 HISTORY — PX: UMBILICAL HERNIA REPAIR: SHX196

## 2023-11-22 LAB — CBC
HCT: 42.2 % (ref 39.0–52.0)
Hemoglobin: 13.9 g/dL (ref 13.0–17.0)
MCH: 31.5 pg (ref 26.0–34.0)
MCHC: 32.9 g/dL (ref 30.0–36.0)
MCV: 95.7 fL (ref 80.0–100.0)
Platelets: 344 10*3/uL (ref 150–400)
RBC: 4.41 MIL/uL (ref 4.22–5.81)
RDW: 14.2 % (ref 11.5–15.5)
WBC: 6.4 10*3/uL (ref 4.0–10.5)
nRBC: 0 % (ref 0.0–0.2)

## 2023-11-22 LAB — BASIC METABOLIC PANEL WITH GFR
Anion gap: 13 (ref 5–15)
BUN: 14 mg/dL (ref 8–23)
CO2: 23 mmol/L (ref 22–32)
Calcium: 9.4 mg/dL (ref 8.9–10.3)
Chloride: 103 mmol/L (ref 98–111)
Creatinine, Ser: 1.28 mg/dL — ABNORMAL HIGH (ref 0.61–1.24)
GFR, Estimated: >60 mL/min (ref >60)
Glucose, Bld: 112 mg/dL — ABNORMAL HIGH (ref 70–99)
Potassium: 3.5 mmol/L (ref 3.5–5.1)
Sodium: 139 mmol/L (ref 135–145)

## 2023-11-22 SURGERY — REPAIR, HERNIA, UMBILICAL, ADULT
Anesthesia: General

## 2023-11-22 MED ORDER — FENTANYL CITRATE (PF) 100 MCG/2ML IJ SOLN
INTRAMUSCULAR | Status: AC
  Filled 2023-11-22: qty 2

## 2023-11-22 MED ORDER — CEFAZOLIN SODIUM-DEXTROSE 2-4 GM/100ML-% IV SOLN
2.0000 g | INTRAVENOUS | Status: AC
  Administered 2023-11-22: 2 g via INTRAVENOUS
  Filled 2023-11-22: qty 100

## 2023-11-22 MED ORDER — LIDOCAINE HCL (PF) 2 % IJ SOLN
INTRAMUSCULAR | Status: DC | PRN
  Administered 2023-11-22: 60 mg via INTRADERMAL

## 2023-11-22 MED ORDER — ORAL CARE MOUTH RINSE: 15.0000 mL | Freq: Once | OROMUCOSAL | Status: AC

## 2023-11-22 MED ORDER — ONDANSETRON HCL 4 MG/2ML IJ SOLN
INTRAMUSCULAR | Status: AC
  Filled 2023-11-22: qty 2

## 2023-11-22 MED ORDER — MIDAZOLAM HCL 5 MG/5ML IJ SOLN
INTRAMUSCULAR | Status: DC | PRN
  Administered 2023-11-22: 2 mg via INTRAVENOUS

## 2023-11-22 MED ORDER — DEXAMETHASONE SODIUM PHOSPHATE 10 MG/ML IJ SOLN
INTRAMUSCULAR | Status: DC | PRN
  Administered 2023-11-22: 10 mg via INTRAVENOUS

## 2023-11-22 MED ORDER — BUPIVACAINE-EPINEPHRINE (PF) 0.25% -1:200000 IJ SOLN
INTRAMUSCULAR | Status: AC
  Filled 2023-11-22: qty 30

## 2023-11-22 MED ORDER — ALBUMIN HUMAN 5 % IV SOLN
INTRAVENOUS | Status: AC
  Filled 2023-11-22: qty 250

## 2023-11-22 MED ORDER — AMISULPRIDE (ANTIEMETIC) 5 MG/2ML IV SOLN: 10.0000 mg | Freq: Once | INTRAVENOUS | Status: DC | PRN

## 2023-11-22 MED ORDER — OXYCODONE HCL 5 MG PO TABS: 5.0000 mg | ORAL_TABLET | ORAL | Status: DC | PRN

## 2023-11-22 MED ORDER — LACTATED RINGERS IV SOLN: INTRAVENOUS | Status: DC

## 2023-11-22 MED ORDER — ROCURONIUM BROMIDE 10 MG/ML (PF) SYRINGE
PREFILLED_SYRINGE | INTRAVENOUS | Status: DC | PRN
  Administered 2023-11-22: 50 mg via INTRAVENOUS

## 2023-11-22 MED ORDER — OXYCODONE HCL 5 MG PO TABS: 5.0000 mg | ORAL_TABLET | Freq: Three times a day (TID) | ORAL | 0 refills | Status: AC | PRN

## 2023-11-22 MED ORDER — ACETAMINOPHEN 650 MG RE SUPP: 650.0000 mg | RECTAL | Status: DC | PRN

## 2023-11-22 MED ORDER — FENTANYL CITRATE PF 50 MCG/ML IJ SOSY: 25.0000 ug | PREFILLED_SYRINGE | INTRAMUSCULAR | Status: DC | PRN

## 2023-11-22 MED ORDER — BUPIVACAINE-EPINEPHRINE 0.25% -1:200000 IJ SOLN
INTRAMUSCULAR | Status: DC | PRN
  Administered 2023-11-22: 30 mL

## 2023-11-22 MED ORDER — KETAMINE HCL 50 MG/5ML IJ SOSY
PREFILLED_SYRINGE | INTRAMUSCULAR | Status: AC
Start: 2023-11-22 — End: 2023-11-22
  Filled 2023-11-22: qty 5

## 2023-11-22 MED ORDER — CHLORHEXIDINE GLUCONATE 4 % EX SOLN: 60.0000 mL | Freq: Once | CUTANEOUS | Status: DC

## 2023-11-22 MED ORDER — 0.9 % SODIUM CHLORIDE (POUR BTL) OPTIME
TOPICAL | Status: DC | PRN
  Administered 2023-11-22: 1000 mL

## 2023-11-22 MED ORDER — SUGAMMADEX SODIUM 200 MG/2ML IV SOLN
INTRAVENOUS | Status: DC | PRN
Start: 2023-11-22 — End: 2023-11-22
  Administered 2023-11-22: 200 mg via INTRAVENOUS

## 2023-11-22 MED ORDER — LIDOCAINE HCL (PF) 2 % IJ SOLN
INTRAMUSCULAR | Status: AC
  Filled 2023-11-22: qty 5

## 2023-11-22 MED ORDER — KETOROLAC TROMETHAMINE 15 MG/ML IJ SOLN: 15.0000 mg | Freq: Once | INTRAMUSCULAR | Status: DC | PRN

## 2023-11-22 MED ORDER — STERILE WATER FOR IRRIGATION IR SOLN
Status: DC | PRN
  Administered 2023-11-22: 1000 mL

## 2023-11-22 MED ORDER — GABAPENTIN 300 MG PO CAPS
300.0000 mg | ORAL_CAPSULE | ORAL | Status: AC
  Administered 2023-11-22: 300 mg via ORAL
  Filled 2023-11-22: qty 1

## 2023-11-22 MED ORDER — ACETAMINOPHEN 325 MG PO TABS: 650.0000 mg | ORAL_TABLET | ORAL | Status: DC | PRN

## 2023-11-22 MED ORDER — ONDANSETRON HCL 4 MG/2ML IJ SOLN: 4.0000 mg | Freq: Once | INTRAMUSCULAR | Status: DC | PRN

## 2023-11-22 MED ORDER — PROPOFOL 10 MG/ML IV BOLUS
INTRAVENOUS | Status: AC
  Filled 2023-11-22: qty 20

## 2023-11-22 MED ORDER — DEXAMETHASONE SODIUM PHOSPHATE 10 MG/ML IJ SOLN
INTRAMUSCULAR | Status: AC
  Filled 2023-11-22: qty 1

## 2023-11-22 MED ORDER — ACETAMINOPHEN 500 MG PO TABS
1000.0000 mg | ORAL_TABLET | ORAL | Status: AC
  Administered 2023-11-22: 1000 mg via ORAL
  Filled 2023-11-22: qty 2

## 2023-11-22 MED ORDER — CHLORHEXIDINE GLUCONATE 0.12 % MT SOLN
15.0000 mL | Freq: Once | OROMUCOSAL | Status: AC
  Administered 2023-11-22: 15 mL via OROMUCOSAL

## 2023-11-22 MED ORDER — MIDAZOLAM HCL 2 MG/2ML IJ SOLN
INTRAMUSCULAR | Status: AC
Start: 2023-11-22 — End: 2023-11-22
  Filled 2023-11-22: qty 2

## 2023-11-22 MED ORDER — ONDANSETRON HCL 4 MG/2ML IJ SOLN
INTRAMUSCULAR | Status: DC | PRN
Start: 2023-11-22 — End: 2023-11-22
  Administered 2023-11-22: 4 mg via INTRAVENOUS

## 2023-11-22 MED ORDER — DOCUSATE SODIUM 100 MG PO CAPS: 100.0000 mg | ORAL_CAPSULE | Freq: Two times a day (BID) | ORAL | 0 refills | Status: AC

## 2023-11-22 MED ORDER — FENTANYL CITRATE (PF) 100 MCG/2ML IJ SOLN
INTRAMUSCULAR | Status: DC | PRN
  Administered 2023-11-22: 100 ug via INTRAVENOUS

## 2023-11-22 MED ORDER — PROPOFOL 10 MG/ML IV BOLUS
INTRAVENOUS | Status: DC | PRN
  Administered 2023-11-22 (×2): 100 mg via INTRAVENOUS

## 2023-11-22 MED ORDER — BUPIVACAINE LIPOSOME 1.3 % IJ SUSP: 20.0000 mL | Freq: Once | INTRAMUSCULAR | Status: DC

## 2023-11-22 MED ORDER — ROCURONIUM BROMIDE 10 MG/ML (PF) SYRINGE
PREFILLED_SYRINGE | INTRAVENOUS | Status: AC
  Filled 2023-11-22: qty 10

## 2023-11-22 SURGICAL SUPPLY — 25 items
BAG COUNTER SPONGE SURGICOUNT (BAG) IMPLANT
BENZOIN TINCTURE PRP APPL 2/3 (GAUZE/BANDAGES/DRESSINGS) IMPLANT
CHLORAPREP W/TINT 26 (MISCELLANEOUS) ×1 IMPLANT
COVER SURGICAL LIGHT HANDLE (MISCELLANEOUS) ×1 IMPLANT
DRAPE LAPAROSCOPIC ABDOMINAL (DRAPES) ×1 IMPLANT
ELECT REM PT RETURN 15FT ADLT (MISCELLANEOUS) ×1 IMPLANT
GAUZE SPONGE 4X4 12PLY STRL (GAUZE/BANDAGES/DRESSINGS) IMPLANT
GLOVE BIO SURGEON STRL SZ 6 (GLOVE) ×1 IMPLANT
GLOVE INDICATOR 6.5 STRL GRN (GLOVE) ×1 IMPLANT
GLOVE SURG POLYISO LF SZ6.5 (GLOVE) IMPLANT
GOWN STRL REUS W/ TWL LRG LVL3 (GOWN DISPOSABLE) ×1 IMPLANT
KIT BASIN OR (CUSTOM PROCEDURE TRAY) ×1 IMPLANT
KIT TURNOVER KIT A (KITS) ×2 IMPLANT
NDL HYPO 22X1.5 SAFETY MO (MISCELLANEOUS) IMPLANT
NEEDLE HYPO 22X1.5 SAFETY MO (MISCELLANEOUS) IMPLANT
PACK GENERAL/GYN (CUSTOM PROCEDURE TRAY) ×1 IMPLANT
SPIKE FLUID TRANSFER (MISCELLANEOUS) ×1 IMPLANT
STRIP CLOSURE SKIN 1/2X4 (GAUZE/BANDAGES/DRESSINGS) IMPLANT
SUT ETHIBOND 0 MO6 C/R (SUTURE) IMPLANT
SUT MNCRL AB 4-0 PS2 18 (SUTURE) ×1 IMPLANT
SUT PROLENE 2 0 CT2 30 (SUTURE) IMPLANT
SUT VIC AB 3-0 SH 27XBRD (SUTURE) IMPLANT
SYR 20ML LL LF (SYRINGE) IMPLANT
SYR CONTROL 10ML LL (SYRINGE) IMPLANT
TOWEL OR 17X26 10 PK STRL BLUE (TOWEL DISPOSABLE) ×1 IMPLANT

## 2023-11-22 NOTE — Discharge Instructions (Addendum)
 HERNIA REPAIR: POST OP INSTRUCTIONS   EAT Gradually transition to your usual diet over the next few days after discharge.  WALK Walk an hour a day (cumulative- not all at once).  Control your pain to do that.    CONTROL PAIN Control pain so that you can walk, sleep, tolerate sneezing/coughing, and go up/down stairs.  HAVE A BOWEL MOVEMENT DAILY Keep your bowels regular to avoid problems.  OK to try a laxative to override constipation.  OK to use an antidiarrheal to slow down diarrhea.  Call if not better after 2 tries  CALL IF YOU HAVE PROBLEMS/CONCERNS Call if you are still struggling despite following these instructions. Call if you have concerns not answered by these instructions  ######################################################################    DIET: Follow a light bland diet & liquids the first 24 hours after arrival home, such as soup, liquids, starches, etc.  Be sure to drink plenty of fluids.  Quickly advance to a usual solid diet within a few days.  Avoid fast food or heavy meals initially as you are more likely to get nauseated or have irregular bowels.    Take your usually prescribed home medications unless otherwise directed.  PAIN CONTROL: Pain is best controlled by a usual combination of three different methods TOGETHER: Ice/Heat Over the counter pain medication Prescription pain medication Most patients will experience some swelling and bruising around the hernia(s) such as the bellybutton, groins, or old incisions.  Ice packs or heating pads (30-60 minutes up to 6 times a day) will help. Use ice for the first few days to help decrease swelling and bruising, then switch to heat to help relax tight/sore spots and speed recovery.  Some people prefer to use ice alone, heat alone, alternating between ice & heat.  Experiment to what works for you.  Swelling and bruising can take several weeks to resolve.   It is helpful to take an over-the-counter pain medication  regularly for the first days: Naproxen (Aleve, etc)  Two 220mg  tabs twice a day OR Ibuprofen (Advil, etc) Three 200mg  tabs four times a day (every meal & bedtime) AND Acetaminophen  (Tylenol , etc) 325-650mg  four times a day (every meal & bedtime) A  prescription for pain medication should be given to you upon discharge.  Take your pain medication as prescribed, IF NEEDED.  If you are having problems/concerns with the prescription medicine (does not control pain, nausea, vomiting, rash, itching, etc), please call us  [phone] to see if we need to switch you to a different pain medicine that will work better for you and/or control your side effect better. If you need a refill on your pain medication, please contact your pharmacy.  They will contact our office to request authorization. Prescriptions will not be filled after 5 pm or on week-ends.  Avoid getting constipated.  Between the surgery and the pain medications, it is common to experience some constipation.  Increasing fluid intake and taking a fiber supplement (such as Metamucil, Citrucel, FiberCon, MiraLax , etc) 1-2 times a day regularly will usually help prevent this problem from occurring.  A mild laxative (prune juice, Milk of Magnesia, MiraLax , etc) should be taken according to package directions if there are no bowel movements after 48 hours.    Wash / shower every day, starting 2 days after surgery.  You may shower over the steri strips or skin glue which are waterproof.  No rubbing, scrubbing, lotions or ointments to incision(s). Do not soak or submerge incision.   Remove your  outer bandage (gauze and tape) 2 days after surgery. Steri strips (small white tapes) will peel off after 1-2 weeks.  You may leave the incision open to air.  You may replace a dressing/Band-Aid to cover an incision for comfort if you wish.  Continue to shower over incision(s) after the dressing is off.  ACTIVITIES as tolerated:   You may resume regular (light)  daily activities beginning the next day--such as daily self-care, walking, climbing stairs--gradually increasing activities as tolerated.  Control your pain so that you can walk an hour a day.  If you can walk 30 minutes without difficulty, it is safe to try more intense activity such as jogging, treadmill, bicycling, low-impact aerobics, swimming, etc. Refrain from the most intensive and strenuous activity such as sit-ups, heavy lifting, contact sports, etc  Refrain from any heavy lifting or straining (more than 20lb) until 6 weeks after surgery.   DO NOT PUSH THROUGH PAIN.  Let pain be your guide: If it hurts to do something, don't do it.  Pain is your body warning you to avoid that activity for another week until the pain goes down. You may drive when you are no longer taking prescription pain medication, you can comfortably wear a seatbelt, and you can safely maneuver your car and apply brakes. You may have sexual intercourse when it is comfortable.   FOLLOW UP in our office Please call [facility] at [phone] to set up an appointment to see your surgeon in the office for a follow-up appointment approximately 2-3 weeks after your surgery. Make sure that you call for this appointment the day you arrive home to insure a convenient appointment time.  9.  If you have disability of FMLA / Family leave forms, please bring the forms to the office for processing.  (do not give to your surgeon).  WHEN TO CALL US  [phone]: Poor pain control Reactions / problems with new medications (rash/itching, nausea, etc)  Fever over 101.5 F (38.5 C) Inability to urinate Nausea and/or vomiting Worsening swelling or bruising Continued bleeding from incision. Increased pain, redness, or drainage from the incision   The clinic staff is available to answer your questions during regular business hours (8:30am-5pm).  Please don't hesitate to call and ask to speak to one of our nurses for clinical concerns.   If  you have a medical emergency, go to the nearest emergency room or call 911.  A surgeon from [facility] is always on call at the hospitals in [facility], [place] [address], [address], [place], [place]  [address] ?  [address] [address], [place], [place]   [address] MAIN: [phone] ? TOLL FREE: [phone] ? FAX: [phone] [email or web address]

## 2023-11-22 NOTE — Transfer of Care (Signed)
 Immediate Anesthesia Transfer of Care Note  Patient: John Mcclain  Procedure(s) Performed: REPAIR, HERNIA, UMBILICAL, ADULT  Patient Location: PACU  Anesthesia Type:General  Level of Consciousness: awake, alert , and oriented  Airway & Oxygen Therapy: Patient Spontanous Breathing and Patient connected to face mask oxygen  Post-op Assessment: Report given to RN and Post -op Vital signs reviewed and stable  Post vital signs: Reviewed and stable  Last Vitals:  Vitals Value Taken Time  BP 169/75 11/22/23 08:28  Temp    Pulse 78 11/22/23 08:30  Resp 11 11/22/23 08:30  SpO2 100 % 11/22/23 08:30  Vitals shown include unfiled device data.  Last Pain:  Vitals:   11/22/23 0618  TempSrc:   PainSc: 0-No pain         Complications: No notable events documented.

## 2023-11-22 NOTE — Op Note (Signed)
 Operative Note  John Mcclain  [id number]  [id number]  11/22/2023   Surgeon: Aldon Hung MD FACS   Procedure performed: umbilical hernia repair   Preop diagnosis: umbilical hernia, fascial defect 1.5cm Post-op diagnosis/intraop findings: same, containining preperitoneal fat   Specimens: no Retained items: no  EBL: minimal cc Complications: none   Description of procedure: After confirmng informed consent the patient was taken to the operating room and placed supine on the operating room table where general endotracheal anesthesia was initiated, preoperative antibiotics were administered, SCDs applied, and a formal timeout was performed.  The abdomen was clipped, prepped and draped in usual sterile fashion.  After infiltration with local (0.25% Marcaine  with epinephrine ) an infraumbilical incision was made and the soft tissue dissected with cautery to expose the hernia sac which was then divided from the overlying umbilical skin and soft with cautery.  The hernia sac was skeletonized to the level of the fascia where it was excised.  The fascia was cleared off circumferentially and the defect measured 1.5 cm.  The hernia contained preperitoneal fat which was easily reduced.  The fascia was closed transversely with simple interrupted 0 Ethibonds.  Additional local was infiltrated in the fascia and surrounding soft tissues.  Hemostasis was ensured in the wound.  The umbilical skin was sutured back down to the fascia with a 3-0 Vicryl and the skin was closed with running subcuticular 4-0 Monocryl.  Benzoin, Steri-Strips, and a light pressure dressing of gauze and tape were then applied.  The patient was then awakened, extubated and taken to PACU in stable condition.    All counts were correct at the completion of the case.

## 2023-11-22 NOTE — Interval H&P Note (Signed)
 History and Physical Interval Note:  11/22/2023 6:59 AM  John Mcclain  has presented today for surgery, with the diagnosis of UMBILICAL HERNIA.  The various methods of treatment have been discussed with the patient and family. After consideration of risks, benefits and other options for treatment, the patient has consented to  Procedure(s): REPAIR, HERNIA, UMBILICAL, ADULT (N/A) as a surgical intervention.  The patient's history has been reviewed, patient examined, no change in status, stable for surgery.  I have reviewed the patient's chart and labs.  Questions were answered to the patient's satisfaction.     Kden Wagster Loyola Rummage

## 2023-11-22 NOTE — Anesthesia Procedure Notes (Signed)
 Procedure Name: Intubation Date/Time: 11/22/2023 7:34 AM  Performed by: Reyhan Moronta D, CRNAPre-anesthesia Checklist: Patient identified, Emergency Drugs available, Suction available and Patient being monitored Patient Re-evaluated:Patient Re-evaluated prior to induction Oxygen Delivery Method: Circle system utilized Preoxygenation: Pre-oxygenation with 100% oxygen Induction Type: IV induction Ventilation: Mask ventilation without difficulty Laryngoscope Size: Mac and 4 Grade View: Grade I Tube type: Oral Tube size: 7.5 mm Number of attempts: 1 Airway Equipment and Method: Stylet and Oral airway Placement Confirmation: ETT inserted through vocal cords under direct vision, positive ETCO2 and breath sounds checked- equal and bilateral Secured at: 22 cm Tube secured with: Tape Dental Injury: Teeth and Oropharynx as per pre-operative assessment

## 2023-11-23 ENCOUNTER — Encounter (HOSPITAL_COMMUNITY): Payer: Self-pay | Admitting: Surgery

## 2023-11-23 NOTE — Anesthesia Postprocedure Evaluation (Signed)
 Anesthesia Post Note  Patient: John Mcclain  Procedure(s) Performed: REPAIR, HERNIA, UMBILICAL, ADULT     Patient location during evaluation: PACU Anesthesia Type: General Level of consciousness: awake Pain management: pain level controlled Vital Signs Assessment: post-procedure vital signs reviewed and stable Respiratory status: spontaneous breathing, nonlabored ventilation and respiratory function stable Cardiovascular status: blood pressure returned to baseline and stable Postop Assessment: no apparent nausea or vomiting Anesthetic complications: no   No notable events documented.  Last Vitals:  Vitals:   11/22/23 0900 11/22/23 0915  BP: (!) 171/89 (!) 160/84  Pulse: 68 70  Resp: 15 15  Temp:  36.5 C  SpO2: 100% 100%    Last Pain:  Vitals:   11/22/23 0900  TempSrc:   PainSc: 2                  Azan Maneri P Randee Huston

## 2023-11-23 NOTE — Progress Notes (Signed)
 John Mcclain MRN: [id number] DOB: [birth year] (age: 68 y.o.)  HPI: Is in clinic today for follow up s/p right carpal tunnel release on 11/09/23 by Dr. Murrell. States that he is doing well with improvement in his pre-op numbness and tingling. He continues to have some numbness in the finger tips.    Physical Exam: On exam this is a well-nourished well-developed individual in no acute distress.  They are alert and oriented x3.  They are cooperative with the exam.  Right Upper Extremity Exam: Overlying skin is warm dry and intact. Incision is healing well without signs of infection, irritation, drainage. Capillary refill is brisk and skin turgor is appropriate. SILT in the ulnar, radial, and median distributions.  Can make a composite fist when compared bilaterally.  Fingers are warm and pink.    Impression/plan: 1. Right CTS s/p release: improved, recovering well  - Sutures removed under alcohol prep today  - Can use hand for daily activities, but should avoid heavy lifting, weight bearing, grasping, pushing, pulling for 2 more weeks  - No soaking / submerging the right hand for 2 weeks - avoid dish water , bath water , lake water   - Keep wound clean and covered   Follow up in 4 weeks with Dr. Kuzma for reassessment.   John Anton, PA-C

## 2023-11-27 ENCOUNTER — Other Ambulatory Visit (HOSPITAL_COMMUNITY): Payer: Self-pay

## 2023-12-13 DIAGNOSIS — Z09 Encounter for follow-up examination after completed treatment for conditions other than malignant neoplasm: Secondary | ICD-10-CM | POA: Diagnosis not present

## 2024-01-01 LAB — HEMOGLOBIN A1C: A1c: 6.8

## 2024-01-12 ENCOUNTER — Other Ambulatory Visit: Payer: Self-pay | Admitting: Emergency Medicine

## 2024-01-12 DIAGNOSIS — Z299 Encounter for prophylactic measures, unspecified: Secondary | ICD-10-CM

## 2024-01-29 DIAGNOSIS — M0609 Rheumatoid arthritis without rheumatoid factor, multiple sites: Secondary | ICD-10-CM | POA: Diagnosis not present

## 2024-01-29 DIAGNOSIS — M1990 Unspecified osteoarthritis, unspecified site: Secondary | ICD-10-CM | POA: Diagnosis not present

## 2024-01-29 DIAGNOSIS — Z79899 Other long term (current) drug therapy: Secondary | ICD-10-CM | POA: Diagnosis not present

## 2024-01-29 DIAGNOSIS — M810 Age-related osteoporosis without current pathological fracture: Secondary | ICD-10-CM | POA: Diagnosis not present

## 2024-02-05 DIAGNOSIS — M810 Age-related osteoporosis without current pathological fracture: Secondary | ICD-10-CM | POA: Diagnosis not present

## 2024-04-07 ENCOUNTER — Encounter: Payer: Self-pay | Admitting: Emergency Medicine

## 2024-04-07 ENCOUNTER — Encounter: Payer: Self-pay | Admitting: Radiology

## 2024-04-07 ENCOUNTER — Ambulatory Visit: Admitting: Emergency Medicine

## 2024-04-07 VITALS — BP 134/80 | HR 92 | Temp 98.1°F | Ht 65.0 in | Wt 149.0 lb

## 2024-04-07 DIAGNOSIS — E1159 Type 2 diabetes mellitus with other circulatory complications: Secondary | ICD-10-CM | POA: Diagnosis not present

## 2024-04-07 DIAGNOSIS — F419 Anxiety disorder, unspecified: Secondary | ICD-10-CM

## 2024-04-07 DIAGNOSIS — Z7984 Long term (current) use of oral hypoglycemic drugs: Secondary | ICD-10-CM

## 2024-04-07 DIAGNOSIS — M069 Rheumatoid arthritis, unspecified: Secondary | ICD-10-CM | POA: Diagnosis not present

## 2024-04-07 DIAGNOSIS — G43809 Other migraine, not intractable, without status migrainosus: Secondary | ICD-10-CM

## 2024-04-07 DIAGNOSIS — I152 Hypertension secondary to endocrine disorders: Secondary | ICD-10-CM | POA: Diagnosis not present

## 2024-04-07 DIAGNOSIS — Z125 Encounter for screening for malignant neoplasm of prostate: Secondary | ICD-10-CM

## 2024-04-07 LAB — CBC WITH DIFFERENTIAL/PLATELET
Basophils Absolute: 0 K/uL (ref 0.0–0.1)
Basophils Relative: 0.4 % (ref 0.0–3.0)
Eosinophils Absolute: 0 K/uL (ref 0.0–0.7)
Eosinophils Relative: 0.2 % (ref 0.0–5.0)
HCT: 42.3 % (ref 39.0–52.0)
Hemoglobin: 14.1 g/dL (ref 13.0–17.0)
Lymphocytes Relative: 14.1 % (ref 12.0–46.0)
Lymphs Abs: 0.8 K/uL (ref 0.7–4.0)
MCHC: 33.2 g/dL (ref 30.0–36.0)
MCV: 96.2 fl (ref 78.0–100.0)
Monocytes Absolute: 0.7 K/uL (ref 0.1–1.0)
Monocytes Relative: 11.8 % (ref 3.0–12.0)
Neutro Abs: 4.1 K/uL (ref 1.4–7.7)
Neutrophils Relative %: 73.5 % (ref 43.0–77.0)
Platelets: 340 K/uL (ref 150.0–400.0)
RBC: 4.4 Mil/uL (ref 4.22–5.81)
RDW: 15.1 % (ref 11.5–15.5)
WBC: 5.6 K/uL (ref 4.0–10.5)

## 2024-04-07 LAB — COMPREHENSIVE METABOLIC PANEL WITH GFR
ALT: 22 U/L (ref 0–53)
AST: 28 U/L (ref 0–37)
Albumin: 5.2 g/dL (ref 3.5–5.2)
Alkaline Phosphatase: 40 U/L (ref 39–117)
BUN: 16 mg/dL (ref 6–23)
CO2: 28 meq/L (ref 19–32)
Calcium: 10.4 mg/dL (ref 8.4–10.5)
Chloride: 104 meq/L (ref 96–112)
Creatinine, Ser: 1.44 mg/dL (ref 0.40–1.50)
GFR: 49.15 mL/min — ABNORMAL LOW (ref >60.00)
Glucose, Bld: 95 mg/dL (ref 70–99)
Potassium: 4.5 meq/L (ref 3.5–5.1)
Sodium: 140 meq/L (ref 135–145)
Total Bilirubin: 0.6 mg/dL (ref 0.2–1.2)
Total Protein: 8.3 g/dL (ref 6.0–8.3)

## 2024-04-07 LAB — LIPID PANEL
Cholesterol: 152 mg/dL (ref 0–200)
HDL: 75.7 mg/dL (ref >39.00)
LDL Cholesterol: 48 mg/dL (ref 0–99)
NonHDL: 75.88
Total CHOL/HDL Ratio: 2
Triglycerides: 141 mg/dL (ref 0.0–149.0)
VLDL: 28.2 mg/dL (ref 0.0–40.0)

## 2024-04-07 LAB — MICROALBUMIN / CREATININE URINE RATIO
Creatinine,U: 334.6 mg/dL
Microalb Creat Ratio: 24.5 mg/g (ref 0.0–30.0)
Microalb, Ur: 8.2 mg/dL — ABNORMAL HIGH (ref 0.0–1.9)

## 2024-04-07 LAB — PSA: PSA: 1.19 ng/mL (ref 0.10–4.00)

## 2024-04-07 LAB — HEMOGLOBIN A1C: Hgb A1c MFr Bld: 6.4 % (ref 4.6–6.5)

## 2024-04-07 MED ORDER — LORAZEPAM 1 MG PO TABS: ORAL_TABLET | ORAL | 1 refills | Status: AC

## 2024-04-07 MED ORDER — LISINOPRIL 40 MG PO TABS
40.0000 mg | ORAL_TABLET | Freq: Every day | ORAL | 3 refills | Status: AC
Start: 2024-04-07 — End: ?

## 2024-04-07 NOTE — Progress Notes (Signed)
 John Mcclain 68 y.o.   Chief Complaint  Patient presents with   Follow-up    Pt had a A1C of 6.8 and is looking to potentially go back on metformin  to control it     HISTORY OF PRESENT ILLNESS: This is a 68 y.o. male here for 10-month follow-up of chronic medical conditions Overall doing well. Has no complaints or any other medical concerns today.  HPI   Prior to Admission medications   Medication Sig Start Date End Date Taking? Authorizing Provider  amLODipine  (NORVASC ) 5 MG tablet Take 1 tablet (5 mg total) by mouth daily. 10/04/23  Yes Shaquille Janes, Emil Schanz, MD  Calcium  Carbonate-Vitamin D  (CALCIUM  600+D PO) Take 1 tablet by mouth daily.   Yes [provider]  cyclobenzaprine (FLEXERIL) 10 MG tablet Take 10 mg by mouth 3 (three) times daily as needed for muscle spasms.   Yes [provider]  Fremanezumab-vfrm (AJOVY) 225 MG/1.5ML SOAJ Inject 225 mg into the skin every 30 (thirty) days.   Yes [provider]  levETIRAcetam  (KEPPRA ) 750 MG tablet Take 750 mg by mouth 2 (two) times daily.    Yes [provider]  Magnesium 400 MG TABS Take 400 mg by mouth every evening.   Yes [provider]  pantoprazole  (PROTONIX ) 40 MG tablet Take 1 tablet (40 mg total) by mouth daily. 11/21/23  Yes Cirigliano, Vito V, DO  polyethylene glycol powder (GLYCOLAX /MIRALAX ) powder Take 17 g by mouth daily. 05/24/17  Yes Gretta Ozell CROME, PA-C  rosuvastatin  (CRESTOR ) 5 MG tablet TAKE 1 TABLET EVERY DAY 01/13/24  Yes Avantae Bither, Emil Schanz, MD  sildenafil  (VIAGRA ) 50 MG tablet Take 50 mg by mouth as needed.   Yes [provider]  Upadacitinib ER (RINVOQ) 15 MG TB24 Take 15 mg by mouth daily. 07/17/18  Yes [provider]  Vitamins-Lipotropics (B COMPLEX FORMULA 1, LIPOTROP,) TABS Take 1 tablet by mouth daily.   Yes [provider]  alendronate (FOSAMAX) 70 MG tablet Take 70 mg by mouth every Saturday. Take with a full glass of water  on an  empty stomach. Patient not taking: Reported on 04/07/2024    [provider]  clobetasol  (OLUX ) 0.05 % topical foam Apply topically 2 (two) times daily. Patient not taking: Reported on 04/07/2024 05/24/22   Purcell Emil Schanz, MD  HYDROcodone -acetaminophen  (NORCO/VICODIN) 5-325 MG tablet Take 1 tablet by mouth every 6 (six) hours as needed for moderate pain (pain score 4-6). Patient not taking: Reported on 04/07/2024 11/09/23   Kuzma, Kevin, MD  hydrocortisone  (ANUSOL -HC) 2.5 % rectal cream Place 1 Application rectally 2 (two) times daily. 10/05/23   Craig Palma R, PA-C  LORazepam  (ATIVAN ) 1 MG tablet TAKE 1/2 TO 1 TABLET EVERY DAY AS NEEDED FOR ANXIETY AS SPARINGLY AS POSSIBLE 04/07/24   Purcell Emil Schanz, MD    Allergies  Allergen Reactions   Penicillins Rash    Only as a child and has had amoxicillin  and no reaction     Patient Active Problem List   Diagnosis Date Noted   Umbilical hernia without obstruction and without gangrene 10/04/2023   History of DVT of lower extremity 08/30/2022   Degenerative scoliosis in adult patient 08/04/2022   Lumbar radiculopathy 05/03/2020   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   Spondylosis without myelopathy or radiculopathy, lumbar region 05/03/2020   Hypogonadism male 12/26/2011   Vitamin D  deficiency 12/26/2011   Rheumatoid arthritis (HCC) 12/26/2011   Migraine headache disorder 12/26/2011   Anxiety  disorder 12/26/2011   Hypertension associated with diabetes (HCC) 12/26/2011    Past Medical History:  Diagnosis Date   Allergy 1959   Anemia    Anxiety    Arthritis    RA   Back pain    Blood transfusion without reported diagnosis 08/14/2022   Chronic migraine    Depression    GERD (gastroesophageal reflux disease)    Heart murmur    History of kidney stones    Hypertension    Pneumonia    Raynaud's disease    Rheumatoid arthritis (HCC)    Ulcer     Past Surgical History:   Procedure Laterality Date   ABDOMINAL EXPOSURE N/A 08/04/2022   Procedure: ABDOMINAL EXPOSURE;  Surgeon: Gretta Lonni PARAS, MD;  Location: [facility] OR;  Service: Vascular;  Laterality: N/A;   ANTERIOR LAT LUMBAR FUSION N/A 08/04/2022   Procedure: Anterior Lumbar Interbody Fusion Lumbar Five-Sacral One;  Surgeon: Carollee Lani BROCKS, DO;  Location: [facility] OR;  Service: Neurosurgery;  Laterality: N/A;   ANTERIOR LAT LUMBAR FUSION N/A 08/04/2022   Procedure: ANTERIOR LATERAL INTERBODY FUSION LUMBAR TWO-THREE, LUMBAR THREE-FOUR, LUMBAR FOUR-FIVE;  Surgeon: Dawley, Lani BROCKS, DO;  Location: [facility] OR;  Service: Neurosurgery;  Laterality: N/A;   CARPAL TUNNEL RELEASE Right 11/09/2023   Procedure: CARPAL TUNNEL RELEASE;  Surgeon: Murrell Drivers, MD;  Location: [facility] OR;  Service: Orthopedics;  Laterality: Right;   COLON SURGERY  2000   polyps removed   COLONOSCOPY WITH ESOPHAGOGASTRODUODENOSCOPY (EGD)  2023   ESOPHAGEAL DILATION  2023   LAMINECTOMY WITH POSTERIOR LATERAL ARTHRODESIS LEVEL 3 N/A 08/08/2022   Procedure: OPEN THORACOLUMBAR INTRUMENTATION,FUSION, THORACIC TEN-PELVIS, LUMBAR THREE-SACRAL ONE DECOMPRESSION  LUMBAR TWO-LUMBAR FIVE POSTERIOR COLUMN OSTEOTOMIES;  Surgeon: Dawley, Lani BROCKS, DO;  Location: [facility] OR;  Service: Neurosurgery;  Laterality: N/A;   LYMPH NODE DISSECTION     removed lymph node   SPINE SURGERY  06/05/22&06/09/22   UMBILICAL HERNIA REPAIR N/A 11/22/2023   Procedure: REPAIR, HERNIA, UMBILICAL, ADULT;  Surgeon: Signe Mitzie LABOR, MD;  Location: [facility];  Service: General;  Laterality: N/A;   VASECTOMY      Social History   Socioeconomic History   Marital status: Married    Spouse name: Not on file   Number of children: Not on file   Years of education: Not on file   Highest education level: Bachelor's degree (e.g., BA, AB, BS)  Occupational History   Not on file  Tobacco Use   Smoking status: Never   Smokeless tobacco: Never  Vaping Use   Vaping status: Never Used  Substance and Sexual  Activity   Alcohol use: Not Currently    Comment: occasionally   Drug use: No   Sexual activity: Yes    Partners: Female    Comment: Vasectomy  Other Topics Concern   Not on file  Social History Narrative   Married. Education: [employer].    Social Drivers of Corporate Investment Banker Strain: Low Risk  (10/03/2023)   Overall Financial Resource Strain (CARDIA)    Difficulty of Paying Living Expenses: Not hard at all  Food Insecurity: Low Risk  (11/16/2023)   Received from [facility]   Hunger Vital Sign    Within the past 12 months, you worried that your food would run out before you got money to buy more: Never true    Within the past 12 months, the food you bought just didn't last and you didn't have money to get more. : Never true  Transportation Needs: No Transportation Needs (11/16/2023)   Received from [facility]    In the past 12 months, has lack of reliable transportation kept you from medical appointments, meetings, work or from getting things needed for daily living? : No  Physical Activity: Unknown (10/03/2023)   Exercise Vital Sign    Days of Exercise per Week: Patient declined    Minutes of Exercise per Session: Not on file  Stress: No Stress Concern Present (10/03/2023)   [employer] of Occupational Health - Occupational Stress Questionnaire    Feeling of Stress : Only a little  Social Connections: Unknown (10/03/2023)   Social Connection and Isolation Panel    Frequency of Communication with Friends and Family: Patient declined    Frequency of Social Gatherings with Friends and Family: Once a week    Attends Religious Services: Patient declined    Database Administrator or Organizations: No    Attends Engineer, Structural: Not on file    Marital Status: Married  Catering Manager Violence: Not At Risk (08/04/2022)   Humiliation, Afraid, Rape, and Kick questionnaire    Fear of Current or Ex-Partner: No    Emotionally Abused: No     Physically Abused: No    Sexually Abused: No    Family History  Problem Relation Age of Onset   Kidney disease Mother    Hypertension Mother    Arthritis Mother        RA   COPD Mother    Hyperlipidemia Mother    Heart attack Father    Diabetes Father    COPD Father    Hypertension Father    Heart disease Father    Migraines Brother    Stroke Maternal Grandfather    Alzheimer's disease Paternal Grandmother    Colon cancer Neg Hx    Pancreatic cancer Neg Hx    Esophageal cancer Neg Hx    Stomach cancer Neg Hx      Review of Systems  Constitutional: Negative.  Negative for chills and fever.  HENT: Negative.  Negative for congestion and sore throat.   Respiratory: Negative.  Negative for cough and shortness of breath.   Cardiovascular: Negative.  Negative for chest pain and palpitations.  Gastrointestinal:  Negative for abdominal pain, diarrhea, nausea and vomiting.  Genitourinary: Negative.  Negative for dysuria and hematuria.  Skin: Negative.  Negative for rash.  Neurological: Negative.  Negative for dizziness and headaches.  All other systems reviewed and are negative.   Vitals:   04/07/24 1347  BP: 134/80  Pulse: 92  Temp: 98.1 F (36.7 C)  SpO2: 97%    Physical Exam Vitals reviewed.  Constitutional:      Appearance: Normal appearance.  HENT:     Head: Normocephalic.     Mouth/Throat:     Mouth: Mucous membranes are moist.     Pharynx: Oropharynx is clear.  Eyes:     Extraocular Movements: Extraocular movements intact.     Conjunctiva/sclera: Conjunctivae normal.     Pupils: Pupils are equal, round, and reactive to light.  Cardiovascular:     Rate and Rhythm: Normal rate and regular rhythm.     Pulses: Normal pulses.     Heart sounds: Normal heart sounds.  Pulmonary:     Effort: Pulmonary effort is normal.     Breath sounds: Normal breath sounds.  Musculoskeletal:     Cervical back: No tenderness.  Lymphadenopathy:     Cervical: No cervical  adenopathy.  Skin:    General: Skin is warm and dry.  Neurological:     General: No focal deficit present.     Mental Status: He is alert and oriented to person, place, and time.  Psychiatric:        Mood and Affect: Mood normal.        Behavior: Behavior normal.      ASSESSMENT & PLAN: A total of 40 minutes was spent with the patient and counseling/coordination of care regarding preparing for this visit, review of most recent office visit notes, review of multiple chronic medical conditions and their management, review of all medications, review of most recent bloodwork results, review of health maintenance items, education on nutrition, prognosis, documentation, and need for follow up.   Problem List Items Addressed This Visit       Cardiovascular and Mediastinum   Migraine headache disorder   Stable and well-controlled       Relevant Medications   lisinopril  (ZESTRIL ) 40 MG tablet   Hypertension associated with diabetes (HCC) - Primary   BP Readings from Last 3 Encounters:  04/07/24 134/80  11/22/23 (!) 160/84  11/09/23 (!) 158/83  Occasional elevated blood pressure readings at home Sensitive to amlodipine .  States he has been taking lisinopril  Recommend to take lisinopril  40 mg daily Cardiovascular risks associated with hypertension and diabetes discussed Last hemoglobin A1c is 6.8.  Will repeat today. May want to go back on metformin  500 mg twice a day Diet and nutrition discussed. Blood work today Follow-up in 6 months       Relevant Medications   lisinopril  (ZESTRIL ) 40 MG tablet   Other Relevant Orders   Microalbumin / creatinine urine ratio   CBC with Differential/Platelet   Comprehensive metabolic panel with GFR   Hemoglobin A1c   Lipid panel     Musculoskeletal and Integument   Rheumatoid arthritis (HCC)   Stable and currently on treatment guided by rheumatologist         Other   Anxiety disorder   Stable on current medications. Takes lorazepam   as needed      Relevant Medications   LORazepam  (ATIVAN ) 1 MG tablet   Other Visit Diagnoses       Screening for prostate cancer       Relevant Orders   PSA      Patient Instructions  Health Maintenance After Age 31 After age 45, you are at a higher risk for certain long-term diseases and infections as well as injuries from falls. Falls are a major cause of broken bones and head injuries in people who are older than age 88. Getting regular preventive care can help to keep you healthy and well. Preventive care includes getting regular testing and making lifestyle changes as recommended by your health care provider. Talk with your health care provider about: Which screenings and tests you should have. A screening is a test that checks for a disease when you have no symptoms. A diet and exercise plan that is right for you. What should I know about screenings and tests to prevent falls? Screening and testing are the best ways to find a health problem early. Early diagnosis and treatment give you the best chance of managing medical conditions that are common after age 68. Certain conditions and lifestyle choices may make you more likely to have a fall. Your health care provider may recommend: Regular vision checks. Poor vision and conditions such as cataracts can make you more likely to have  a fall. If you wear glasses, make sure to get your prescription updated if your vision changes. Medicine review. Work with your health care provider to regularly review all of the medicines you are taking, including over-the-counter medicines. Ask your health care provider about any side effects that may make you more likely to have a fall. Tell your health care provider if any medicines that you take make you feel dizzy or sleepy. Strength and balance checks. Your health care provider may recommend certain tests to check your strength and balance while standing, walking, or changing positions. Foot health  exam. Foot pain and numbness, as well as not wearing proper footwear, can make you more likely to have a fall. Screenings, including: Osteoporosis screening. Osteoporosis is a condition that causes the bones to get weaker and break more easily. Blood pressure screening. Blood pressure changes and medicines to control blood pressure can make you feel dizzy. Depression screening. You may be more likely to have a fall if you have a fear of falling, feel depressed, or feel unable to do activities that you used to do. Alcohol use screening. Using too much alcohol can affect your balance and may make you more likely to have a fall. Follow these instructions at home: Lifestyle Do not drink alcohol if: Your health care provider tells you not to drink. If you drink alcohol: Limit how much you have to: 0-1 drink a day for women. 0-2 drinks a day for men. Know how much alcohol is in your drink. In the [place] one drink equals one 12 oz bottle of beer (355 mL), one 5 oz glass of wine (148 mL), or one 1 oz glass of hard liquor (44 mL). Do not use any products that contain nicotine or tobacco. These products include cigarettes, chewing tobacco, and vaping devices, such as e-cigarettes. If you need help quitting, ask your health care provider. Activity  Follow a regular exercise program to stay fit. This will help you maintain your balance. Ask your health care provider what types of exercise are appropriate for you. If you need a cane or walker, use it as recommended by your health care provider. Wear supportive shoes that have nonskid soles. Safety  Remove any tripping hazards, such as rugs, cords, and clutter. Install safety equipment such as grab bars in bathrooms and safety rails on stairs. Keep rooms and walkways well-lit. General instructions Talk with your health care provider about your risks for falling. Tell your health care provider if: You fall. Be sure to tell your health care provider  about all falls, even ones that seem minor. You feel dizzy, tiredness (fatigue), or off-balance. Take over-the-counter and prescription medicines only as told by your health care provider. These include supplements. Eat a healthy diet and maintain a healthy weight. A healthy diet includes low-fat dairy products, low-fat (lean) meats, and fiber from whole grains, beans, and lots of fruits and vegetables. Stay current with your vaccines. Schedule regular health, dental, and eye exams. Summary Having a healthy lifestyle and getting preventive care can help to protect your health and wellness after age 11. Screening and testing are the best way to find a health problem early and help you avoid having a fall. Early diagnosis and treatment give you the best chance for managing medical conditions that are more common for people who are older than age 34. Falls are a major cause of broken bones and head injuries in people who are older than age 87. Take precautions to prevent  a fall at home. Work with your health care provider to learn what changes you can make to improve your health and wellness and to prevent falls. This information is not intended to replace advice given to you by your health care provider. Make sure you discuss any questions you have with your health care provider. Document Revised: 10/11/2020 Document Reviewed: 10/11/2020 Elsevier Patient Education  2024 Elsevier Inc.     Emil Schaumann, MD [facility]

## 2024-04-07 NOTE — Assessment & Plan Note (Signed)
 Stable and well controlled.

## 2024-04-07 NOTE — Assessment & Plan Note (Signed)
 BP Readings from Last 3 Encounters:  04/07/24 134/80  11/22/23 (!) 160/84  11/09/23 (!) 158/83  Occasional elevated blood pressure readings at home Sensitive to amlodipine .  States he has been taking lisinopril  Recommend to take lisinopril  40 mg daily Cardiovascular risks associated with hypertension and diabetes discussed Last hemoglobin A1c is 6.8.  Will repeat today. May want to go back on metformin  500 mg twice a day Diet and nutrition discussed. Blood work today Follow-up in 6 months

## 2024-04-07 NOTE — Patient Instructions (Signed)
 Health Maintenance After Age 70 After age 27, you are at a higher risk for certain long-term diseases and infections as well as injuries from falls. Falls are a major cause of broken bones and head injuries in people who are older than age 73. Getting regular preventive care can help to keep you healthy and well. Preventive care includes getting regular testing and making lifestyle changes as recommended by your health care provider. Talk with your health care provider about: Which screenings and tests you should have. A screening is a test that checks for a disease when you have no symptoms. A diet and exercise plan that is right for you. What should I know about screenings and tests to prevent falls? Screening and testing are the best ways to find a health problem early. Early diagnosis and treatment give you the best chance of managing medical conditions that are common after age 90. Certain conditions and lifestyle choices may make you more likely to have a fall. Your health care provider may recommend: Regular vision checks. Poor vision and conditions such as cataracts can make you more likely to have a fall. If you wear glasses, make sure to get your prescription updated if your vision changes. Medicine review. Work with your health care provider to regularly review all of the medicines you are taking, including over-the-counter medicines. Ask your health care provider about any side effects that may make you more likely to have a fall. Tell your health care provider if any medicines that you take make you feel dizzy or sleepy. Strength and balance checks. Your health care provider may recommend certain tests to check your strength and balance while standing, walking, or changing positions. Foot health exam. Foot pain and numbness, as well as not wearing proper footwear, can make you more likely to have a fall. Screenings, including: Osteoporosis screening. Osteoporosis is a condition that causes  the bones to get weaker and break more easily. Blood pressure screening. Blood pressure changes and medicines to control blood pressure can make you feel dizzy. Depression screening. You may be more likely to have a fall if you have a fear of falling, feel depressed, or feel unable to do activities that you used to do. Alcohol  use screening. Using too much alcohol  can affect your balance and may make you more likely to have a fall. Follow these instructions at home: Lifestyle Do not drink alcohol  if: Your health care provider tells you not to drink. If you drink alcohol : Limit how much you have to: 0-1 drink a day for women. 0-2 drinks a day for men. Know how much alcohol  is in your drink. In the [place] one drink equals one 12 oz bottle of beer (355 mL), one 5 oz glass of wine (148 mL), or one 1 oz glass of hard liquor (44 mL). Do not use any products that contain nicotine or tobacco. These products include cigarettes, chewing tobacco, and vaping devices, such as e-cigarettes. If you need help quitting, ask your health care provider. Activity  Follow a regular exercise program to stay fit. This will help you maintain your balance. Ask your health care provider what types of exercise are appropriate for you. If you need a cane or walker, use it as recommended by your health care provider. Wear supportive shoes that have nonskid soles. Safety  Remove any tripping hazards, such as rugs, cords, and clutter. Install safety equipment such as grab bars in bathrooms and safety rails on stairs. Keep rooms and walkways  well-lit. General instructions Talk with your health care provider about your risks for falling. Tell your health care provider if: You fall. Be sure to tell your health care provider about all falls, even ones that seem minor. You feel dizzy, tiredness (fatigue), or off-balance. Take over-the-counter and prescription medicines only as told by your health care provider. These include  supplements. Eat a healthy diet and maintain a healthy weight. A healthy diet includes low-fat dairy products, low-fat (lean) meats, and fiber from whole grains, beans, and lots of fruits and vegetables. Stay current with your vaccines. Schedule regular health, dental, and eye exams. Summary Having a healthy lifestyle and getting preventive care can help to protect your health and wellness after age 15. Screening and testing are the best way to find a health problem early and help you avoid having a fall. Early diagnosis and treatment give you the best chance for managing medical conditions that are more common for people who are older than age 42. Falls are a major cause of broken bones and head injuries in people who are older than age 64. Take precautions to prevent a fall at home. Work with your health care provider to learn what changes you can make to improve your health and wellness and to prevent falls. This information is not intended to replace advice given to you by your health care provider. Make sure you discuss any questions you have with your health care provider. Document Revised: 10/11/2020 Document Reviewed: 10/11/2020 Elsevier Patient Education  2024 [employer].

## 2024-04-07 NOTE — Assessment & Plan Note (Signed)
 Stable and currently on treatment guided by rheumatologist

## 2024-04-07 NOTE — Assessment & Plan Note (Signed)
 Stable on current medications. Takes lorazepam  as needed

## 2024-04-08 ENCOUNTER — Other Ambulatory Visit: Payer: Self-pay

## 2024-04-08 ENCOUNTER — Ambulatory Visit: Payer: Self-pay | Admitting: Emergency Medicine

## 2024-04-08 DIAGNOSIS — I152 Hypertension secondary to endocrine disorders: Secondary | ICD-10-CM

## 2024-04-08 DIAGNOSIS — N1831 Chronic kidney disease, stage 3a: Secondary | ICD-10-CM | POA: Insufficient documentation

## 2024-04-08 MED ORDER — CYCLOBENZAPRINE HCL 10 MG PO TABS: 10.0000 mg | ORAL_TABLET | Freq: Three times a day (TID) | ORAL | 0 refills | Status: AC | PRN

## 2024-04-08 MED ORDER — DAPAGLIFLOZIN PROPANEDIOL 10 MG PO TABS: 10.0000 mg | ORAL_TABLET | Freq: Every day | ORAL | 3 refills | Status: AC

## 2024-04-08 NOTE — Telephone Encounter (Signed)
 Yes

## 2024-04-08 NOTE — Telephone Encounter (Signed)
 Ok to send in for flexeril?

## 2024-04-09 ENCOUNTER — Other Ambulatory Visit: Payer: Self-pay

## 2024-04-09 MED ORDER — EMPAGLIFLOZIN 10 MG PO TABS: 10.0000 mg | ORAL_TABLET | Freq: Every day | ORAL | 0 refills | Status: AC

## 2024-04-09 NOTE — Addendum Note (Signed)
 Addended by: ROSALVA LEX RAMAN on: 04/09/2024 11:49 AM   Modules accepted: Orders

## 2024-04-09 NOTE — Telephone Encounter (Signed)
**Note De-identified  [place] Obfuscation** Please advise 

## 2024-04-16 ENCOUNTER — Encounter: Payer: Self-pay | Admitting: Emergency Medicine

## 2024-04-19 DIAGNOSIS — I959 Hypotension, unspecified: Secondary | ICD-10-CM | POA: Diagnosis not present

## 2024-04-19 DIAGNOSIS — W19XXXA Unspecified fall, initial encounter: Secondary | ICD-10-CM | POA: Diagnosis not present

## 2024-04-19 DIAGNOSIS — R0902 Hypoxemia: Secondary | ICD-10-CM | POA: Diagnosis not present

## 2024-04-20 ENCOUNTER — Other Ambulatory Visit: Payer: Self-pay

## 2024-04-20 ENCOUNTER — Encounter (HOSPITAL_COMMUNITY): Payer: Self-pay | Admitting: Emergency Medicine

## 2024-04-20 ENCOUNTER — Emergency Department (HOSPITAL_COMMUNITY)
Admission: EM | Admit: 2024-04-20 | Discharge: 2024-04-20 | Disposition: A | Discharge status: Home / Self Care | Attending: Emergency Medicine | Admitting: Emergency Medicine

## 2024-04-20 ENCOUNTER — Emergency Department (HOSPITAL_COMMUNITY)

## 2024-04-20 DIAGNOSIS — R55 Syncope and collapse: Secondary | ICD-10-CM | POA: Diagnosis not present

## 2024-04-20 DIAGNOSIS — I959 Hypotension, unspecified: Secondary | ICD-10-CM | POA: Insufficient documentation

## 2024-04-20 DIAGNOSIS — Z79899 Other long term (current) drug therapy: Secondary | ICD-10-CM | POA: Diagnosis not present

## 2024-04-20 DIAGNOSIS — I129 Hypertensive chronic kidney disease with stage 1 through stage 4 chronic kidney disease, or unspecified chronic kidney disease: Secondary | ICD-10-CM | POA: Diagnosis not present

## 2024-04-20 DIAGNOSIS — N189 Chronic kidney disease, unspecified: Secondary | ICD-10-CM | POA: Insufficient documentation

## 2024-04-20 LAB — URINALYSIS, ROUTINE W REFLEX MICROSCOPIC
Bacteria, UA: NONE SEEN
Bilirubin Urine: NEGATIVE
Glucose, UA: >=500 mg/dL — AB
Hgb urine dipstick: NEGATIVE
Ketones, ur: NEGATIVE mg/dL
Leukocytes,Ua: NEGATIVE
Nitrite: NEGATIVE
Protein, ur: NEGATIVE mg/dL
Specific Gravity, Urine: 1.024 (ref 1.005–1.030)
pH: 5 (ref 5.0–8.0)

## 2024-04-20 LAB — CBC
HCT: 38.2 % — ABNORMAL LOW (ref 39.0–52.0)
Hemoglobin: 12.6 g/dL — ABNORMAL LOW (ref 13.0–17.0)
MCH: 32.3 pg (ref 26.0–34.0)
MCHC: 33 g/dL (ref 30.0–36.0)
MCV: 97.9 fL (ref 80.0–100.0)
Platelets: 342 K/uL (ref 150–400)
RBC: 3.9 MIL/uL — ABNORMAL LOW (ref 4.22–5.81)
RDW: 14 % (ref 11.5–15.5)
WBC: 14.9 K/uL — ABNORMAL HIGH (ref 4.0–10.5)
nRBC: 0 % (ref 0.0–0.2)

## 2024-04-20 LAB — COMPREHENSIVE METABOLIC PANEL WITH GFR
ALT: 29 U/L (ref 0–44)
AST: 32 U/L (ref 15–41)
Albumin: 3.7 g/dL (ref 3.5–5.0)
Alkaline Phosphatase: 28 U/L — ABNORMAL LOW (ref 38–126)
Anion gap: 11 (ref 5–15)
BUN: 12 mg/dL (ref 8–23)
CO2: 22 mmol/L (ref 22–32)
Calcium: 8.7 mg/dL — ABNORMAL LOW (ref 8.9–10.3)
Chloride: 106 mmol/L (ref 98–111)
Creatinine, Ser: 1.09 mg/dL (ref 0.61–1.24)
GFR, Estimated: >60 mL/min (ref >60)
Glucose, Bld: 97 mg/dL (ref 70–99)
Potassium: 4 mmol/L (ref 3.5–5.1)
Sodium: 139 mmol/L (ref 135–145)
Total Bilirubin: 0.3 mg/dL (ref 0.0–1.2)
Total Protein: 6.2 g/dL — ABNORMAL LOW (ref 6.5–8.1)

## 2024-04-20 LAB — RESP PANEL BY RT-PCR (RSV, FLU A&B, COVID)  RVPGX2
Influenza A by PCR: NEGATIVE
Influenza B by PCR: NEGATIVE
Resp Syncytial Virus by PCR: NEGATIVE
SARS Coronavirus 2 by RT PCR: NEGATIVE

## 2024-04-20 NOTE — ED Triage Notes (Addendum)
 Patient BIB [facility] from home post syncopal episode. This was witnessed by wife. He was leaning over his bed, he had just gone to the bathroom and had a bowel movement, was nauseated,had a syncopal episode,  face planted into the bed. Was only out for a few seconds. The wife broke him from actually falling, assisted him to the floor. He felt like it was related to low BP. He had a recent increase in BP meds last week. His Lisinopril  increased from 20 to 40 mg. BP 92/56 initially with EMS they administered 500 mls of NS and BP improved to 112/57. Pt Aox4. He reports drinking two glasses of whiskey and sprite tonight- states this is the first time he's drank in 8 months.

## 2024-04-20 NOTE — Discharge Instructions (Addendum)
 Your workup today was largely reassuring.  Suspect your syncopal event due to increased lisinopril  dosing causing low blood pressure. Recommend to hold your blood pressure medication today and discussed this with your primary care doctor on Monday morning. Take it easy today, make sure to rest and hydrate well. Please return here for any new or acute changes.

## 2024-04-20 NOTE — ED Provider Notes (Signed)
 [place] [facility] AT [facility] Provider Note   CSN: [id number] Arrival date & time: 04/20/24  9987     Patient presents with: Loss of Consciousness   Rece John Mcclain is a 68 y.o. male.   The history is provided by the patient and medical records.  Loss of Consciousness  69 y.o. M with hx of HTN, anxiety, RA, CKD, presenting to the ED following syncopal event.  Patient states he went to bathroom and had a BM and was going back to get into bed.  He got next to the bed, started to feel nauseated and went to lay down.  Tried to stand back up and got lightheaded/dizzy and started to pass out.  Wife lowered him to the floor, did not strike head or fully lose consciousness.  Remembers continuing to talk to wife but felt were woozy and out of it a bit.  No seizure activity.  EMS arrived and was hypotensive, given IVF with improvement.  Does report some recent medication changes-- had lisinopril  increased from 20 mg to 40 mg daily due to some blood pressure spikes.  Also started Jardiance today for the very first time due to prediabetes.  Had 2 glasses of whiskey and Sprite tonight, milder drinks.  First alcohol use had in 8 months.  He denies any chest pain or shortness of breath.  Has been working in the yard this week but staying well-hydrated and has not had any issues with doing so.  Currently, asymptomatic but just feeling quite tired now.  He does take his lisinopril  dose at night, took last dose just before bed.  Prior to Admission medications   Medication Sig Start Date End Date Taking? Authorizing Provider  empagliflozin (JARDIANCE) 10 MG TABS tablet Take 1 tablet (10 mg total) by mouth daily. 04/09/24   Sagardia, Miguel Jose, MD  alendronate (FOSAMAX) 70 MG tablet Take 70 mg by mouth every Saturday. Take with a full glass of water  on an empty stomach. Patient not taking: Reported on 04/07/2024    [provider]  amLODipine  (NORVASC ) 5 MG tablet Take 1 tablet  (5 mg total) by mouth daily. 10/04/23   Purcell Emil Schanz, MD  Calcium  Carbonate-Vitamin D  (CALCIUM  600+D PO) Take 1 tablet by mouth daily.    [provider]  clobetasol  (OLUX ) 0.05 % topical foam Apply topically 2 (two) times daily. Patient not taking: Reported on 04/07/2024 05/24/22   Sagardia, Miguel Jose, MD  cyclobenzaprine (FLEXERIL) 10 MG tablet Take 1 tablet (10 mg total) by mouth 3 (three) times daily as needed for muscle spasms. 04/08/24   Purcell Emil Schanz, MD  dapagliflozin propanediol (FARXIGA) 10 MG TABS tablet Take 1 tablet (10 mg total) by mouth daily before breakfast. 04/08/24   Sagardia, Emil Schanz, MD  Fremanezumab-vfrm (AJOVY) 225 MG/1.5ML SOAJ Inject 225 mg into the skin every 30 (thirty) days.    [provider]  HYDROcodone -acetaminophen  (NORCO/VICODIN) 5-325 MG tablet Take 1 tablet by mouth every 6 (six) hours as needed for moderate pain (pain score 4-6). Patient not taking: Reported on 04/07/2024 11/09/23   Kuzma, Kevin, MD  levETIRAcetam  (KEPPRA ) 750 MG tablet Take 750 mg by mouth 2 (two) times daily.     [provider]  lisinopril  (ZESTRIL ) 40 MG tablet Take 1 tablet (40 mg total) by mouth daily. 04/07/24   Purcell Emil Schanz, MD  LORazepam  (ATIVAN ) 1 MG tablet TAKE 1/2 TO 1 TABLET EVERY DAY AS NEEDED FOR ANXIETY AS SPARINGLY AS POSSIBLE  04/07/24   Sagardia, Miguel Jose, MD  Magnesium 400 MG TABS Take 400 mg by mouth every evening.    [provider]  pantoprazole  (PROTONIX ) 40 MG tablet Take 1 tablet (40 mg total) by mouth daily. 11/21/23   Cirigliano, Vito V, DO  polyethylene glycol powder (GLYCOLAX /MIRALAX ) powder Take 17 g by mouth daily. 05/24/17   Gretta Ozell CROME, PA-C  rosuvastatin  (CRESTOR ) 5 MG tablet TAKE 1 TABLET EVERY DAY 01/13/24   Purcell Emil Schanz, MD  sildenafil  (VIAGRA ) 50 MG tablet Take 50 mg by mouth as needed.    [provider]  Upadacitinib ER (RINVOQ) 15 MG TB24 Take 15 mg by mouth daily. 07/17/18    [provider]  Vitamins-Lipotropics (B COMPLEX FORMULA 1, LIPOTROP,) TABS Take 1 tablet by mouth daily.    [provider]    Allergies: Penicillins    Review of Systems  Cardiovascular:  Positive for syncope.  All other systems reviewed and are negative.   Updated Vital Signs BP 136/68   Pulse 87   Temp 99.1 F (37.3 C) (Oral)   Resp 17   SpO2 100%   Physical Exam Vitals and nursing note reviewed.  Constitutional:      Appearance: He is well-developed.  HENT:     Head: Normocephalic and atraumatic.     Comments: No visible head trauma Eyes:     Conjunctiva/sclera: Conjunctivae normal.     Pupils: Pupils are equal, round, and reactive to light.  Cardiovascular:     Rate and Rhythm: Normal rate and regular rhythm.     Heart sounds: Normal heart sounds.  Pulmonary:     Effort: Pulmonary effort is normal.     Breath sounds: Normal breath sounds.  Abdominal:     General: Bowel sounds are normal.     Palpations: Abdomen is soft.     Tenderness: There is no abdominal tenderness. There is no guarding or rebound.  Musculoskeletal:        General: Normal range of motion.     Cervical back: Normal range of motion.  Skin:    General: Skin is warm and dry.  Neurological:     Mental Status: He is alert and oriented to person, place, and time.     Comments: AAOx3, answering questions and following commands appropriately; equal strength UE and LE bilaterally; CN grossly intact; moves all extremities appropriately without ataxia; no focal neuro deficits or facial asymmetry appreciated     (all labs ordered are listed, but only abnormal results are displayed) Labs Reviewed  COMPREHENSIVE METABOLIC PANEL WITH GFR - Abnormal; Notable for the following components:      Result Value   Calcium  8.7 (*)    Total Protein 6.2 (*)    Alkaline Phosphatase 28 (*)    All other components within normal limits  CBC - Abnormal; Notable for the following components:   WBC  14.9 (*)    RBC 3.90 (*)    Hemoglobin 12.6 (*)    HCT 38.2 (*)    All other components within normal limits  URINALYSIS, ROUTINE W REFLEX MICROSCOPIC - Abnormal; Notable for the following components:   Glucose, UA >=500 (*)    All other components within normal limits  RESP PANEL BY RT-PCR (RSV, FLU A&B, COVID)  [id number]    EKG: None  Radiology: [facility] Chest Port 1 View Result Date: 04/20/2024 EXAM: 1 VIEW(S) XRAY OF THE CHEST 04/20/2024 12:39:45 AM COMPARISON: Chest x-ray 08/15/2022. CLINICAL HISTORY: Syncope. FINDINGS:  LUNGS AND PLEURA: No focal pulmonary opacity. No pleural effusion. No pneumothorax. HEART AND MEDIASTINUM: No acute abnormality of the cardiac and mediastinal silhouettes. BONES AND SOFT TISSUES: No acute osseous abnormality. Old healed left rib fracture. IMPRESSION: 1. No acute cardiopulmonary process. Electronically signed by: Morgane Naveau MD 04/20/2024 12:48 AM EST RP Workstation: [id number]     Procedures   Medications Ordered in the ED - No data to display                                  Medical Decision Making Amount and/or Complexity of Data Reviewed Labs: ordered. Radiology: ordered and independent interpretation performed. ECG/medicine tests: ordered and independent interpretation performed.   68 year old male presenting to the ED after syncopal event.  Witnessed by wife and lowered to the floor. Did not have any head trauma.  Does report recent medication changes including lisinopril  and increased dosing from 20 mg to 40 mg and also started Jardiance today.  Also had 2 alcoholic beverages this evening, has not drank in about 8 months.  He is awake, alert, oriented here.  He does not have any focal neurologic deficits.  Denies any chest pain or shortness of breath.  Hemodynamically stable currently.  EKG is nonischemic.  Labs are grossly reassuring--does have leukocytosis but no fever or other infectious symptoms.  Renal function remains at baseline, no  electrolyte derangement.  UA without any signs of infection.  RVP is negative.  Chest x-ray is clear.  Patient was observed here and remains stable.  He was able to get up and ambulate here with steady gait.  Remains without any focal neurologic deficits.  He is comfortable going home.  Suspect his syncope and hypotension today related to increased lisinopril  dosing.  I recommended that he hold his medication today and discussed with his primary care doctor on Monday.  Can return here for any new or acute changes.  Final diagnoses:  Syncope, unspecified syncope type  Hypotension, unspecified hypotension type    ED Discharge Orders     None          Jarold Olam HERO, PA-C 04/20/24 0416    Lorette Mayo, MD 04/20/24 [id number]

## 2024-04-21 NOTE — Telephone Encounter (Signed)
 Take only 20 mg of lisinopril  daily and monitor blood pressure daily several times for the next couple weeks.

## 2024-05-07 ENCOUNTER — Telehealth: Payer: Self-pay | Admitting: Pharmacist

## 2024-05-07 DIAGNOSIS — E1159 Type 2 diabetes mellitus with other circulatory complications: Secondary | ICD-10-CM

## 2024-05-07 NOTE — Telephone Encounter (Signed)
 Pharmacy Quality Measure Review  This patient is appearing on a report for being at risk of failing the Glycemic Status Assessment in Diabetes measure this calendar year.   Last documented A1c or GMI 6.4% on 04/07/24  Darrelyn Drum, PharmD, BCPS, CPP Clinical Pharmacist Practitioner [facility] [phone]

## 2024-05-20 ENCOUNTER — Ambulatory Visit: Admitting: Emergency Medicine

## 2024-05-20 ENCOUNTER — Encounter: Payer: Self-pay | Admitting: Emergency Medicine

## 2024-05-20 VITALS — BP 126/78 | HR 70 | Temp 97.5°F | Wt 144.0 lb

## 2024-05-20 DIAGNOSIS — M069 Rheumatoid arthritis, unspecified: Secondary | ICD-10-CM

## 2024-05-20 DIAGNOSIS — G43809 Other migraine, not intractable, without status migrainosus: Secondary | ICD-10-CM

## 2024-05-20 DIAGNOSIS — I152 Hypertension secondary to endocrine disorders: Secondary | ICD-10-CM

## 2024-05-20 DIAGNOSIS — F419 Anxiety disorder, unspecified: Secondary | ICD-10-CM

## 2024-05-20 DIAGNOSIS — Z7984 Long term (current) use of oral hypoglycemic drugs: Secondary | ICD-10-CM | POA: Diagnosis not present

## 2024-05-20 DIAGNOSIS — E1159 Type 2 diabetes mellitus with other circulatory complications: Secondary | ICD-10-CM

## 2024-05-20 NOTE — Patient Instructions (Signed)
 Hypertension, Adult High blood pressure (hypertension) is when the force of blood pumping through the arteries is too strong. The arteries are the blood vessels that carry blood from the heart throughout the body. Hypertension forces the heart to work harder to pump blood and may cause arteries to become narrow or stiff. Untreated or uncontrolled hypertension can lead to a heart attack, heart failure, a stroke, kidney disease, and other problems. A blood pressure reading consists of a higher number over a lower number. Ideally, your blood pressure should be below 120/80. The first ("top") number is called the systolic pressure. It is a measure of the pressure in your arteries as your heart beats. The second ("bottom") number is called the diastolic pressure. It is a measure of the pressure in your arteries as the heart relaxes. What are the causes? The exact cause of this condition is not known. There are some conditions that result in high blood pressure. What increases the risk? Certain factors may make you more likely to develop high blood pressure. Some of these risk factors are under your control, including: Smoking. Not getting enough exercise or physical activity. Being overweight. Having too much fat, sugar, calories, or salt (sodium) in your diet. Drinking too much alcohol. Other risk factors include: Having a personal history of heart disease, diabetes, high cholesterol, or kidney disease. Stress. Having a family history of high blood pressure and high cholesterol. Having obstructive sleep apnea. Age. The risk increases with age. What are the signs or symptoms? High blood pressure may not cause symptoms. Very high blood pressure (hypertensive crisis) may cause: Headache. Fast or irregular heartbeats (palpitations). Shortness of breath. Nosebleed. Nausea and vomiting. Vision changes. Severe chest pain, dizziness, and seizures. How is this diagnosed? This condition is diagnosed by  measuring your blood pressure while you are seated, with your arm resting on a flat surface, your legs uncrossed, and your feet flat on the floor. The cuff of the blood pressure monitor will be placed directly against the skin of your upper arm at the level of your heart. Blood pressure should be measured at least twice using the same arm. Certain conditions can cause a difference in blood pressure between your right and left arms. If you have a high blood pressure reading during one visit or you have normal blood pressure with other risk factors, you may be asked to: Return on a different day to have your blood pressure checked again. Monitor your blood pressure at home for 1 week or longer. If you are diagnosed with hypertension, you may have other blood or imaging tests to help your health care provider understand your overall risk for other conditions. How is this treated? This condition is treated by making healthy lifestyle changes, such as eating healthy foods, exercising more, and reducing your alcohol intake. You may be referred for counseling on a healthy diet and physical activity. Your health care provider may prescribe medicine if lifestyle changes are not enough to get your blood pressure under control and if: Your systolic blood pressure is above 130. Your diastolic blood pressure is above 80. Your personal target blood pressure may vary depending on your medical conditions, your age, and other factors. Follow these instructions at home: Eating and drinking  Eat a diet that is high in fiber and potassium, and low in sodium, added sugar, and fat. An example of this eating plan is called the DASH diet. DASH stands for Dietary Approaches to Stop Hypertension. To eat this way: Eat  plenty of fresh fruits and vegetables. Try to fill one half of your plate at each meal with fruits and vegetables. Eat whole grains, such as whole-wheat pasta, brown rice, or whole-grain bread. Fill about one  fourth of your plate with whole grains. Eat or drink low-fat dairy products, such as skim milk or low-fat yogurt. Avoid fatty cuts of meat, processed or cured meats, and poultry with skin. Fill about one fourth of your plate with lean proteins, such as fish, chicken without skin, beans, eggs, or tofu. Avoid pre-made and processed foods. These tend to be higher in sodium, added sugar, and fat. Reduce your daily sodium intake. Many people with hypertension should eat less than 1,500 mg of sodium a day. Do not drink alcohol if: Your health care provider tells you not to drink. You are pregnant, may be pregnant, or are planning to become pregnant. If you drink alcohol: Limit how much you have to: 0-1 drink a day for women. 0-2 drinks a day for men. Know how much alcohol is in your drink. In the [place] one drink equals one 12 oz bottle of beer (355 mL), one 5 oz glass of wine (148 mL), or one 1 oz glass of hard liquor (44 mL). Lifestyle  Work with your health care provider to maintain a healthy body weight or to lose weight. Ask what an ideal weight is for you. Get at least 30 minutes of exercise that causes your heart to beat faster (aerobic exercise) most days of the week. Activities may include walking, swimming, or biking. Include exercise to strengthen your muscles (resistance exercise), such as Pilates or lifting weights, as part of your weekly exercise routine. Try to do these types of exercises for 30 minutes at least 3 days a week. Do not use any products that contain nicotine or tobacco. These products include cigarettes, chewing tobacco, and vaping devices, such as e-cigarettes. If you need help quitting, ask your health care provider. Monitor your blood pressure at home as told by your health care provider. Keep all follow-up visits. This is important. Medicines Take over-the-counter and prescription medicines only as told by your health care provider. Follow directions carefully. Blood  pressure medicines must be taken as prescribed. Do not skip doses of blood pressure medicine. Doing this puts you at risk for problems and can make the medicine less effective. Ask your health care provider about side effects or reactions to medicines that you should watch for. Contact a health care provider if you: Think you are having a reaction to a medicine you are taking. Have headaches that keep coming back (recurring). Feel dizzy. Have swelling in your ankles. Have trouble with your vision. Get help right away if you: Develop a severe headache or confusion. Have unusual weakness or numbness. Feel faint. Have severe pain in your chest or abdomen. Vomit repeatedly. Have trouble breathing. These symptoms may be an emergency. Get help right away. Call 911. Do not wait to see if the symptoms will go away. Do not drive yourself to the hospital. Summary Hypertension is when the force of blood pumping through your arteries is too strong. If this condition is not controlled, it may put you at risk for serious complications. Your personal target blood pressure may vary depending on your medical conditions, your age, and other factors. For most people, a normal blood pressure is less than 120/80. Hypertension is treated with lifestyle changes, medicines, or a combination of both. Lifestyle changes include losing weight, eating a healthy,  low-sodium diet, exercising more, and limiting alcohol. This information is not intended to replace advice given to you by your health care provider. Make sure you discuss any questions you have with your health care provider. Document Revised: 03/29/2021 Document Reviewed: 03/29/2021 Elsevier Patient Education  2024 [employer].

## 2024-05-20 NOTE — Assessment & Plan Note (Signed)
 Stable on current medications. Takes lorazepam  as needed

## 2024-05-20 NOTE — Assessment & Plan Note (Signed)
 Stable and currently on treatment guided by rheumatologist

## 2024-05-20 NOTE — Assessment & Plan Note (Signed)
 BP Readings from Last 3 Encounters:  05/20/24 (!) 142/80  04/20/24 116/74  04/07/24 134/80  Elevated blood pressure reading in the office but normal readings at home with a couple of elevated ones over the last 4 to 5 weeks Cardiovascular risks associated with uncontrolled hypertension discussed Presently taking 20 mg of lisinopril .  Will continue.  No longer taking amlodipine  Lab Results  Component Value Date   HGBA1C 6.4 04/07/2024  Continue Jardiance  10 mg daily Diet and nutrition discussed Advised to stay well-hydrated while taking Jardiance 

## 2024-05-20 NOTE — Assessment & Plan Note (Signed)
 Stable and well controlled.

## 2024-05-20 NOTE — Progress Notes (Signed)
 John Mcclain 68 y.o.   Chief Complaint  Patient presents with   Follow-up    Blood Pressure     HISTORY OF PRESENT ILLNESS: This is a 69 y.o. male here for follow-up of emergency department visit on 04/20/2024 when he presented with near syncopal episode Was found to be hypotensive.  Had just recently increase lisinopril  from 20 mg to 40 mg and started Jardiance  10 mg daily Was found to be dehydrated Doing well today.  Blood pressure readings at home on 20 mg of lisinopril  mostly within normal limits with a couple of high outliers Asymptomatic. Has no other complaints or medical concerns today. BP Readings from Last 3 Encounters:  04/20/24 116/74  04/07/24 134/80  11/22/23 (!) 160/84     HPI   Prior to Admission medications  Medication Sig Start Date End Date Taking? Authorizing Provider  amLODipine  (NORVASC ) 5 MG tablet Take 1 tablet (5 mg total) by mouth daily. 10/04/23  Yes Kellon Chalk, Emil Schanz, MD  Calcium  Carbonate-Vitamin D  (CALCIUM  600+D PO) Take 1 tablet by mouth daily.   Yes [provider]  cyclobenzaprine  (FLEXERIL ) 10 MG tablet Take 1 tablet (10 mg total) by mouth 3 (three) times daily as needed for muscle spasms. 04/08/24  Yes Deakin Lacek, Emil Schanz, MD  dapagliflozin  propanediol (FARXIGA ) 10 MG TABS tablet Take 1 tablet (10 mg total) by mouth daily before breakfast. 04/08/24  Yes Jaymien Landin, Emil Schanz, MD  empagliflozin  (JARDIANCE ) 10 MG TABS tablet Take 1 tablet (10 mg total) by mouth daily. 04/09/24  Yes Deloy Archey, Emil Schanz, MD  Fremanezumab-vfrm (AJOVY) 225 MG/1.5ML SOAJ Inject 225 mg into the skin every 30 (thirty) days.   Yes [provider]  levETIRAcetam  (KEPPRA ) 750 MG tablet Take 750 mg by mouth 2 (two) times daily.    Yes [provider]  lisinopril  (ZESTRIL ) 40 MG tablet Take 1 tablet (40 mg total) by mouth daily. 04/07/24  Yes Aven Cegielski, Emil Schanz, MD  LORazepam  (ATIVAN ) 1 MG tablet TAKE 1/2 TO 1 TABLET EVERY DAY AS NEEDED FOR  ANXIETY AS SPARINGLY AS POSSIBLE 04/07/24  Yes Yohance Hathorne, Emil Schanz, MD  Magnesium 400 MG TABS Take 400 mg by mouth every evening.   Yes [provider]  pantoprazole  (PROTONIX ) 40 MG tablet Take 1 tablet (40 mg total) by mouth daily. 11/21/23  Yes Cirigliano, Vito V, DO  polyethylene glycol powder (GLYCOLAX /MIRALAX ) powder Take 17 g by mouth daily. 05/24/17  Yes Gretta Ozell CROME, PA-C  rosuvastatin  (CRESTOR ) 5 MG tablet TAKE 1 TABLET EVERY DAY 01/13/24  Yes Rian Busche, Emil Schanz, MD  sildenafil  (VIAGRA ) 50 MG tablet Take 50 mg by mouth as needed.   Yes [provider]  Upadacitinib ER (RINVOQ) 15 MG TB24 Take 15 mg by mouth daily. 07/17/18  Yes [provider]  Vitamins-Lipotropics (B COMPLEX FORMULA 1, LIPOTROP,) TABS Take 1 tablet by mouth daily.   Yes [provider]  alendronate (FOSAMAX) 70 MG tablet Take 70 mg by mouth every Saturday. Take with a full glass of water  on an empty stomach. Patient not taking: Reported on 05/20/2024    [provider]  clobetasol  (OLUX ) 0.05 % topical foam Apply topically 2 (two) times daily. Patient not taking: Reported on 05/20/2024 05/24/22   Purcell Emil Schanz, MD  HYDROcodone -acetaminophen  (NORCO/VICODIN) 5-325 MG tablet Take 1 tablet by mouth every 6 (six) hours as needed for moderate pain (pain score 4-6). Patient not taking: Reported on 05/20/2024 11/09/23   Kuzma, Kevin, MD    Allergies[1]  Patient Active Problem List   Diagnosis Date Noted   Stage 3a chronic kidney disease (HCC) 04/08/2024   Umbilical hernia without obstruction and without gangrene 10/04/2023   History of DVT of lower extremity 08/30/2022   Degenerative scoliosis in adult patient 08/04/2022   Lumbar radiculopathy 05/03/2020   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   Spondylosis without myelopathy or radiculopathy, lumbar region 05/03/2020   Hypogonadism male 12/26/2011   Vitamin D  deficiency  12/26/2011   Rheumatoid arthritis (HCC) 12/26/2011   Migraine headache disorder 12/26/2011   Anxiety disorder 12/26/2011   Hypertension associated with diabetes (HCC) 12/26/2011    Past Medical History:  Diagnosis Date   Allergy 1959   Anemia    Anxiety    Arthritis    RA   Back pain    Blood transfusion without reported diagnosis 08/14/2022   Chronic migraine    Depression    GERD (gastroesophageal reflux disease)    Heart murmur    History of kidney stones    Hypertension    Pneumonia    Raynaud's disease    Rheumatoid arthritis (HCC)    Ulcer     Past Surgical History:  Procedure Laterality Date   ABDOMINAL EXPOSURE N/A 08/04/2022   Procedure: ABDOMINAL EXPOSURE;  Surgeon: Gretta Lonni PARAS, MD;  Location: [facility] OR;  Service: Vascular;  Laterality: N/A;   ANTERIOR LAT LUMBAR FUSION N/A 08/04/2022   Procedure: Anterior Lumbar Interbody Fusion Lumbar Five-Sacral One;  Surgeon: Carollee Lani BROCKS, DO;  Location: [facility] OR;  Service: Neurosurgery;  Laterality: N/A;   ANTERIOR LAT LUMBAR FUSION N/A 08/04/2022   Procedure: ANTERIOR LATERAL INTERBODY FUSION LUMBAR TWO-THREE, LUMBAR THREE-FOUR, LUMBAR FOUR-FIVE;  Surgeon: Dawley, Lani BROCKS, DO;  Location: [facility] OR;  Service: Neurosurgery;  Laterality: N/A;   CARPAL TUNNEL RELEASE Right 11/09/2023   Procedure: CARPAL TUNNEL RELEASE;  Surgeon: Murrell Drivers, MD;  Location: [facility] OR;  Service: Orthopedics;  Laterality: Right;   COLON SURGERY  2000   polyps removed   COLONOSCOPY WITH ESOPHAGOGASTRODUODENOSCOPY (EGD)  2023   ESOPHAGEAL DILATION  2023   LAMINECTOMY WITH POSTERIOR LATERAL ARTHRODESIS LEVEL 3 N/A 08/08/2022   Procedure: OPEN THORACOLUMBAR INTRUMENTATION,FUSION, THORACIC TEN-PELVIS, LUMBAR THREE-SACRAL ONE DECOMPRESSION  LUMBAR TWO-LUMBAR FIVE POSTERIOR COLUMN OSTEOTOMIES;  Surgeon: Dawley, Lani BROCKS, DO;  Location: [facility] OR;  Service: Neurosurgery;  Laterality: N/A;   LYMPH NODE DISSECTION     removed lymph node   SPINE SURGERY  06/05/22&06/09/22    UMBILICAL HERNIA REPAIR N/A 11/22/2023   Procedure: REPAIR, HERNIA, UMBILICAL, ADULT;  Surgeon: Signe Mitzie LABOR, MD;  Location: [facility];  Service: General;  Laterality: N/A;   VASECTOMY      Social History   Socioeconomic History   Marital status: Married    Spouse name: Not on file   Number of children: Not on file   Years of education: Not on file   Highest education level: Bachelor's degree (e.g., BA, AB, BS)  Occupational History   Not on file  Tobacco Use   Smoking status: Never   Smokeless tobacco: Never  Vaping Use   Vaping status: Never Used  Substance and Sexual Activity   Alcohol use: Not Currently    Comment: occasionally   Drug use: No   Sexual activity: Yes    Partners: Female    Comment: Vasectomy  Other Topics Concern   Not on file  Social History Narrative   Married. Education: [employer].    Social Drivers of Health  Tobacco Use: Low Risk (04/20/2024)   Patient History    Smoking Tobacco Use: Never    Smokeless Tobacco Use: Never    Passive Exposure: Not on file  Financial Resource Strain: Medium Risk (05/19/2024)   Overall Financial Resource Strain (CARDIA)    Difficulty of Paying Living Expenses: Somewhat hard  Food Insecurity: No Food Insecurity (05/19/2024)   Epic    Worried About Programme Researcher, Broadcasting/film/video in the Last Year: Never true    Ran Out of Food in the Last Year: Never true  Transportation Needs: No Transportation Needs (05/19/2024)   Epic    Lack of Transportation (Medical): No    Lack of Transportation (Non-Medical): No  Physical Activity: Insufficiently Active (05/19/2024)   Exercise Vital Sign    Days of Exercise per Week: 6 days    Minutes of Exercise per Session: 20 min  Stress: Stress Concern Present (05/19/2024)   [employer] of Occupational Health - Occupational Stress Questionnaire    Feeling of Stress: To some extent  Social Connections: Unknown (05/19/2024)   Social Connection and Isolation Panel    Frequency of  Communication with Friends and Family: Patient declined    Frequency of Social Gatherings with Friends and Family: Patient declined    Attends Religious Services: Patient declined    Database Administrator or Organizations: No    Attends Engineer, Structural: Not on file    Marital Status: Married  Catering Manager Violence: Not At Risk (08/04/2022)   Humiliation, Afraid, Rape, and Kick questionnaire    Fear of Current or Ex-Partner: No    Emotionally Abused: No    Physically Abused: No    Sexually Abused: No  Depression (PHQ2-9): Low Risk (10/04/2023)   Depression (PHQ2-9)    PHQ-2 Score: 0  Alcohol Screen: Low Risk (05/19/2024)   Alcohol Screen    Last Alcohol Screening Score (AUDIT): 1  Housing: Low Risk (05/19/2024)   Epic    Unable to Pay for Housing in the Last Year: No    Number of Times Moved in the Last Year: 0    Homeless in the Last Year: No  Utilities: Low Risk (11/16/2023)   Received from [facility]   Utilities    In the past 12 months has the electric, gas, oil, or water  company threatened to shut off services in your home? : No  Health Literacy: Not on file    Family History  Problem Relation Age of Onset   Kidney disease Mother    Hypertension Mother    Arthritis Mother        RA   COPD Mother    Hyperlipidemia Mother    Heart attack Father    Diabetes Father    COPD Father    Hypertension Father    Heart disease Father    Migraines Brother    Stroke Maternal Grandfather    Alzheimer's disease Paternal Grandmother    Colon cancer Neg Hx    Pancreatic cancer Neg Hx    Esophageal cancer Neg Hx    Stomach cancer Neg Hx      Review of Systems  Constitutional: Negative.  Negative for chills and fever.  HENT: Negative.  Negative for congestion and sore throat.   Respiratory: Negative.  Negative for cough and shortness of breath.   Cardiovascular: Negative.  Negative for chest pain and palpitations.  Gastrointestinal:  Negative for abdominal  pain, nausea and vomiting.  Genitourinary: Negative.  Negative for dysuria and  hematuria.  Skin: Negative.  Negative for rash.  Neurological: Negative.  Negative for dizziness and headaches.  All other systems reviewed and are negative.   Today's Vitals   05/20/24 1339 05/20/24 1344 05/20/24 1402  BP: (!) 142/80 (!) 142/80 126/78  Pulse: 70    Temp: (!) 97.5 F (36.4 C)    TempSrc: Oral    SpO2: 99%    Weight: 144 lb (65.3 kg)     Body mass index is 23.96 kg/m.   Physical Exam Vitals reviewed.  Constitutional:      Appearance: Normal appearance.  HENT:     Head: Normocephalic.     Right Ear: Tympanic membrane, ear canal and external ear normal.     Left Ear: Tympanic membrane, ear canal and external ear normal.     Mouth/Throat:     Mouth: Mucous membranes are moist.     Pharynx: Oropharynx is clear.  Eyes:     Extraocular Movements: Extraocular movements intact.     Conjunctiva/sclera: Conjunctivae normal.     Pupils: Pupils are equal, round, and reactive to light.  Cardiovascular:     Rate and Rhythm: Normal rate and regular rhythm.     Pulses: Normal pulses.     Heart sounds: Normal heart sounds.  Pulmonary:     Effort: Pulmonary effort is normal.     Breath sounds: Normal breath sounds.  Abdominal:     Palpations: Abdomen is soft.     Tenderness: There is no abdominal tenderness.  Musculoskeletal:     Cervical back: No tenderness.  Lymphadenopathy:     Cervical: No cervical adenopathy.  Skin:    General: Skin is warm and dry.     Capillary Refill: Capillary refill takes less than 2 seconds.  Neurological:     General: No focal deficit present.     Mental Status: He is alert and oriented to person, place, and time.  Psychiatric:        Mood and Affect: Mood normal.        Behavior: Behavior normal.      ASSESSMENT & PLAN: A total of 40 minutes was spent with the patient and counseling/coordination of care regarding preparing for this visit, review of  most recent office visit notes, review of multiple chronic medical conditions and their management, review of all medications, review of most recent bloodwork results, review of health maintenance items, education on nutrition, prognosis, documentation, and need for follow up.  Problem List Items Addressed This Visit       Cardiovascular and Mediastinum   Migraine headache disorder   Stable and well-controlled       Hypertension associated with diabetes (HCC) - Primary   BP Readings from Last 3 Encounters:  05/20/24 (!) 142/80  04/20/24 116/74  04/07/24 134/80  Elevated blood pressure reading in the office but normal readings at home with a couple of elevated ones over the last 4 to 5 weeks Cardiovascular risks associated with uncontrolled hypertension discussed Presently taking 20 mg of lisinopril .  Will continue.  No longer taking amlodipine  Lab Results  Component Value Date   HGBA1C 6.4 04/07/2024  Continue Jardiance  10 mg daily Diet and nutrition discussed Advised to stay well-hydrated while taking Jardiance           Musculoskeletal and Integument   Rheumatoid arthritis (HCC)   Stable and currently on treatment guided by rheumatologist         Other   Anxiety disorder   Stable on current  medications. Takes lorazepam  as needed      Patient Instructions  Hypertension, Adult High blood pressure (hypertension) is when the force of blood pumping through the arteries is too strong. The arteries are the blood vessels that carry blood from the heart throughout the body. Hypertension forces the heart to work harder to pump blood and may cause arteries to become narrow or stiff. Untreated or uncontrolled hypertension can lead to a heart attack, heart failure, a stroke, kidney disease, and other problems. A blood pressure reading consists of a higher number over a lower number. Ideally, your blood pressure should be below 120/80. The first (top) number is called the systolic  pressure. It is a measure of the pressure in your arteries as your heart beats. The second (bottom) number is called the diastolic pressure. It is a measure of the pressure in your arteries as the heart relaxes. What are the causes? The exact cause of this condition is not known. There are some conditions that result in high blood pressure. What increases the risk? Certain factors may make you more likely to develop high blood pressure. Some of these risk factors are under your control, including: Smoking. Not getting enough exercise or physical activity. Being overweight. Having too much fat, sugar, calories, or salt (sodium) in your diet. Drinking too much alcohol. Other risk factors include: Having a personal history of heart disease, diabetes, high cholesterol, or kidney disease. Stress. Having a family history of high blood pressure and high cholesterol. Having obstructive sleep apnea. Age. The risk increases with age. What are the signs or symptoms? High blood pressure may not cause symptoms. Very high blood pressure (hypertensive crisis) may cause: Headache. Fast or irregular heartbeats (palpitations). Shortness of breath. Nosebleed. Nausea and vomiting. Vision changes. Severe chest pain, dizziness, and seizures. How is this diagnosed? This condition is diagnosed by measuring your blood pressure while you are seated, with your arm resting on a flat surface, your legs uncrossed, and your feet flat on the floor. The cuff of the blood pressure monitor will be placed directly against the skin of your upper arm at the level of your heart. Blood pressure should be measured at least twice using the same arm. Certain conditions can cause a difference in blood pressure between your right and left arms. If you have a high blood pressure reading during one visit or you have normal blood pressure with other risk factors, you may be asked to: Return on a different day to have your blood  pressure checked again. Monitor your blood pressure at home for 1 week or longer. If you are diagnosed with hypertension, you may have other blood or imaging tests to help your health care provider understand your overall risk for other conditions. How is this treated? This condition is treated by making healthy lifestyle changes, such as eating healthy foods, exercising more, and reducing your alcohol intake. You may be referred for counseling on a healthy diet and physical activity. Your health care provider may prescribe medicine if lifestyle changes are not enough to get your blood pressure under control and if: Your systolic blood pressure is above 130. Your diastolic blood pressure is above 80. Your personal target blood pressure may vary depending on your medical conditions, your age, and other factors. Follow these instructions at home: Eating and drinking  Eat a diet that is high in fiber and potassium, and low in sodium, added sugar, and fat. An example of this eating plan is called the DASH  diet. DASH stands for Dietary Approaches to Stop Hypertension. To eat this way: Eat plenty of fresh fruits and vegetables. Try to fill one half of your plate at each meal with fruits and vegetables. Eat whole grains, such as whole-wheat pasta, brown rice, or whole-grain bread. Fill about one fourth of your plate with whole grains. Eat or drink low-fat dairy products, such as skim milk or low-fat yogurt. Avoid fatty cuts of meat, processed or cured meats, and poultry with skin. Fill about one fourth of your plate with lean proteins, such as fish, chicken without skin, beans, eggs, or tofu. Avoid pre-made and processed foods. These tend to be higher in sodium, added sugar, and fat. Reduce your daily sodium intake. Many people with hypertension should eat less than 1,500 mg of sodium a day. Do not drink alcohol if: Your health care provider tells you not to drink. You are pregnant, may be pregnant, or  are planning to become pregnant. If you drink alcohol: Limit how much you have to: 0-1 drink a day for women. 0-2 drinks a day for men. Know how much alcohol is in your drink. In the [place] one drink equals one 12 oz bottle of beer (355 mL), one 5 oz glass of wine (148 mL), or one 1 oz glass of hard liquor (44 mL). Lifestyle  Work with your health care provider to maintain a healthy body weight or to lose weight. Ask what an ideal weight is for you. Get at least 30 minutes of exercise that causes your heart to beat faster (aerobic exercise) most days of the week. Activities may include walking, swimming, or biking. Include exercise to strengthen your muscles (resistance exercise), such as Pilates or lifting weights, as part of your weekly exercise routine. Try to do these types of exercises for 30 minutes at least 3 days a week. Do not use any products that contain nicotine or tobacco. These products include cigarettes, chewing tobacco, and vaping devices, such as e-cigarettes. If you need help quitting, ask your health care provider. Monitor your blood pressure at home as told by your health care provider. Keep all follow-up visits. This is important. Medicines Take over-the-counter and prescription medicines only as told by your health care provider. Follow directions carefully. Blood pressure medicines must be taken as prescribed. Do not skip doses of blood pressure medicine. Doing this puts you at risk for problems and can make the medicine less effective. Ask your health care provider about side effects or reactions to medicines that you should watch for. Contact a health care provider if you: Think you are having a reaction to a medicine you are taking. Have headaches that keep coming back (recurring). Feel dizzy. Have swelling in your ankles. Have trouble with your vision. Get help right away if you: Develop a severe headache or confusion. Have unusual weakness or numbness. Feel  faint. Have severe pain in your chest or abdomen. Vomit repeatedly. Have trouble breathing. These symptoms may be an emergency. Get help right away. Call 911. Do not wait to see if the symptoms will go away. Do not drive yourself to the hospital. Summary Hypertension is when the force of blood pumping through your arteries is too strong. If this condition is not controlled, it may put you at risk for serious complications. Your personal target blood pressure may vary depending on your medical conditions, your age, and other factors. For most people, a normal blood pressure is less than 120/80. Hypertension is treated with lifestyle changes,  medicines, or a combination of both. Lifestyle changes include losing weight, eating a healthy, low-sodium diet, exercising more, and limiting alcohol. This information is not intended to replace advice given to you by your health care provider. Make sure you discuss any questions you have with your health care provider. Document Revised: 03/29/2021 Document Reviewed: 03/29/2021 Elsevier Patient Education  2024 Elsevier Inc.      Emil Schaumann, MD [facility]    [1]  Allergies Allergen Reactions   Penicillins Rash    Only as a child and has had amoxicillin  and no reaction

## 2024-06-04 NOTE — Telephone Encounter (Signed)
 Copied from CRM #[id number]. Topic: General - Other >> Jun 04, 2024 11:15 AM Tinnie C wrote: Reason for CRM: Pt states that the application completed by Dr. Lebron nurse for coverage for his Jardiance  was sent in with information missing and that there may be a page not filled out. Missing the strength of medication and date. Please ensure Jardiance  application is completed and contact patient at [phone]

## 2024-06-06 NOTE — Telephone Encounter (Signed)
 I have fax this back over and updated it as well and it was fax back successfully

## 2024-07-08 ENCOUNTER — Telehealth: Payer: Self-pay

## 2024-07-08 NOTE — Telephone Encounter (Signed)
 Copied from CRM #[id number]. Topic: Clinical - Prescription Issue >> Jul 08, 2024  2:54 PM Roselie BROCKS wrote: Reason for CRM: Patient calling because [employer] Patient assistance program states they received the fax for the prescription for Jardiance ,but the section that should read 10 mg is not visible, and needs a new script faxed in. [phone]

## 2024-07-08 NOTE — Telephone Encounter (Signed)
 Duplicate

## 2024-07-11 NOTE — Telephone Encounter (Signed)
 I have printed the script and faxed back over
